# Patient Record
Sex: Female | Born: 1980 | Race: Black or African American | Hispanic: No | Marital: Single | Smoking: Former smoker
Health system: Southern US, Community
[De-identification: ages and names within clinical notes are randomized; demographics above are authoritative.]

## PROBLEM LIST (undated history)

## (undated) DIAGNOSIS — D219 Benign neoplasm of connective and other soft tissue, unspecified: Secondary | ICD-10-CM

## (undated) DIAGNOSIS — Z91018 Allergy to other foods: Secondary | ICD-10-CM

## (undated) DIAGNOSIS — R011 Cardiac murmur, unspecified: Secondary | ICD-10-CM

## (undated) DIAGNOSIS — F419 Anxiety disorder, unspecified: Secondary | ICD-10-CM

## (undated) DIAGNOSIS — A5400 Gonococcal infection of lower genitourinary tract, unspecified: Secondary | ICD-10-CM

## (undated) DIAGNOSIS — M2550 Pain in unspecified joint: Secondary | ICD-10-CM

## (undated) DIAGNOSIS — F32A Depression, unspecified: Secondary | ICD-10-CM

## (undated) DIAGNOSIS — F988 Other specified behavioral and emotional disorders with onset usually occurring in childhood and adolescence: Secondary | ICD-10-CM

## (undated) DIAGNOSIS — M549 Dorsalgia, unspecified: Secondary | ICD-10-CM

## (undated) DIAGNOSIS — G4730 Sleep apnea, unspecified: Secondary | ICD-10-CM

## (undated) DIAGNOSIS — E669 Obesity, unspecified: Secondary | ICD-10-CM

## (undated) DIAGNOSIS — I10 Essential (primary) hypertension: Secondary | ICD-10-CM

## (undated) DIAGNOSIS — L309 Dermatitis, unspecified: Secondary | ICD-10-CM

## (undated) DIAGNOSIS — R609 Edema, unspecified: Secondary | ICD-10-CM

## (undated) DIAGNOSIS — Z22322 Carrier or suspected carrier of Methicillin resistant Staphylococcus aureus: Secondary | ICD-10-CM

## (undated) HISTORY — DX: Depression, unspecified: F32.A

## (undated) HISTORY — DX: Pain in unspecified joint: M25.50

## (undated) HISTORY — DX: Edema, unspecified: R60.9

## (undated) HISTORY — DX: Gonococcal infection of lower genitourinary tract, unspecified: A54.00

## (undated) HISTORY — DX: Other specified behavioral and emotional disorders with onset usually occurring in childhood and adolescence: F98.8

## (undated) HISTORY — DX: Allergy to other foods: Z91.018

## (undated) HISTORY — PX: NO PAST SURGERIES: SHX2092

## (undated) HISTORY — DX: Dorsalgia, unspecified: M54.9

---

## 2000-04-10 ENCOUNTER — Emergency Department (HOSPITAL_COMMUNITY): Admission: EM | Admit: 2000-04-10 | Discharge: 2000-04-10 | Payer: Self-pay | Admitting: Emergency Medicine

## 2000-05-01 ENCOUNTER — Encounter: Admission: RE | Admit: 2000-05-01 | Discharge: 2000-05-01 | Payer: Self-pay | Admitting: Internal Medicine

## 2000-06-07 ENCOUNTER — Encounter: Payer: Self-pay | Admitting: Emergency Medicine

## 2000-06-07 ENCOUNTER — Emergency Department (HOSPITAL_COMMUNITY): Admission: EM | Admit: 2000-06-07 | Discharge: 2000-06-07 | Payer: Self-pay | Admitting: Emergency Medicine

## 2000-07-05 ENCOUNTER — Emergency Department (HOSPITAL_COMMUNITY): Admission: EM | Admit: 2000-07-05 | Discharge: 2000-07-05 | Payer: Self-pay | Admitting: *Deleted

## 2001-05-03 ENCOUNTER — Emergency Department (HOSPITAL_COMMUNITY): Admission: EM | Admit: 2001-05-03 | Discharge: 2001-05-03 | Payer: Self-pay | Admitting: Unknown Physician Specialty

## 2001-09-14 ENCOUNTER — Emergency Department (HOSPITAL_COMMUNITY): Admission: EM | Admit: 2001-09-14 | Discharge: 2001-09-14 | Payer: Self-pay | Admitting: Emergency Medicine

## 2004-06-05 ENCOUNTER — Encounter (INDEPENDENT_AMBULATORY_CARE_PROVIDER_SITE_OTHER): Payer: Self-pay | Admitting: Nurse Practitioner

## 2004-06-05 LAB — CONVERTED CEMR LAB

## 2005-04-03 ENCOUNTER — Emergency Department (HOSPITAL_COMMUNITY): Admission: EM | Admit: 2005-04-03 | Discharge: 2005-04-03 | Payer: Self-pay | Admitting: Family Medicine

## 2005-09-24 ENCOUNTER — Inpatient Hospital Stay (HOSPITAL_COMMUNITY): Admission: AD | Admit: 2005-09-24 | Discharge: 2005-09-24 | Payer: Self-pay | Admitting: *Deleted

## 2005-09-25 ENCOUNTER — Inpatient Hospital Stay (HOSPITAL_COMMUNITY): Admission: AD | Admit: 2005-09-25 | Discharge: 2005-09-25 | Payer: Self-pay | Admitting: *Deleted

## 2006-04-20 ENCOUNTER — Emergency Department (HOSPITAL_COMMUNITY): Admission: EM | Admit: 2006-04-20 | Discharge: 2006-04-20 | Payer: Self-pay | Admitting: Emergency Medicine

## 2007-01-11 ENCOUNTER — Emergency Department (HOSPITAL_COMMUNITY): Admission: EM | Admit: 2007-01-11 | Discharge: 2007-01-11 | Payer: Self-pay | Admitting: Family Medicine

## 2007-01-21 ENCOUNTER — Ambulatory Visit: Payer: Self-pay | Admitting: Internal Medicine

## 2007-01-21 ENCOUNTER — Encounter: Payer: Self-pay | Admitting: Nurse Practitioner

## 2007-01-21 DIAGNOSIS — E66813 Obesity, class 3: Secondary | ICD-10-CM | POA: Insufficient documentation

## 2007-01-21 DIAGNOSIS — K029 Dental caries, unspecified: Secondary | ICD-10-CM | POA: Insufficient documentation

## 2007-01-21 DIAGNOSIS — J45909 Unspecified asthma, uncomplicated: Secondary | ICD-10-CM | POA: Insufficient documentation

## 2007-01-21 DIAGNOSIS — E669 Obesity, unspecified: Secondary | ICD-10-CM | POA: Insufficient documentation

## 2007-02-21 ENCOUNTER — Ambulatory Visit: Payer: Self-pay | Admitting: Nurse Practitioner

## 2007-02-21 DIAGNOSIS — L309 Dermatitis, unspecified: Secondary | ICD-10-CM | POA: Insufficient documentation

## 2007-02-21 DIAGNOSIS — R21 Rash and other nonspecific skin eruption: Secondary | ICD-10-CM | POA: Insufficient documentation

## 2007-02-21 LAB — CONVERTED CEMR LAB
Alkaline Phosphatase: 63 units/L (ref 39–117)
BUN: 12 mg/dL (ref 6–23)
Beta hcg, urine, semiquantitative: NEGATIVE
Eosinophils Absolute: 0.2 10*3/uL (ref 0.0–0.7)
Eosinophils Relative: 4 % (ref 0–5)
Glucose, Bld: 88 mg/dL (ref 70–99)
Glucose, Urine, Semiquant: NEGATIVE
HCT: 41.2 % (ref 36.0–46.0)
HDL: 45 mg/dL (ref >39)
LDL Cholesterol: 64 mg/dL (ref 0–99)
Lymphs Abs: 2.1 10*3/uL (ref 0.7–3.3)
MCV: 93.6 fL (ref 78.0–100.0)
Monocytes Relative: 9 % (ref 3–11)
Nitrite: NEGATIVE
Platelets: 267 10*3/uL (ref 150–400)
Total Bilirubin: 0.4 mg/dL (ref 0.3–1.2)
Triglycerides: 65 mg/dL (ref <150)
Urobilinogen, UA: 0.2
VLDL: 13 mg/dL (ref 0–40)
WBC: 4.9 10*3/uL (ref 4.0–10.5)
pH: 7.5

## 2007-02-25 ENCOUNTER — Ambulatory Visit: Payer: Self-pay | Admitting: *Deleted

## 2007-03-07 ENCOUNTER — Ambulatory Visit (HOSPITAL_BASED_OUTPATIENT_CLINIC_OR_DEPARTMENT_OTHER): Admission: RE | Admit: 2007-03-07 | Discharge: 2007-03-07 | Payer: Self-pay | Admitting: Nurse Practitioner

## 2007-03-07 ENCOUNTER — Encounter (INDEPENDENT_AMBULATORY_CARE_PROVIDER_SITE_OTHER): Payer: Self-pay | Admitting: Nurse Practitioner

## 2007-03-14 ENCOUNTER — Ambulatory Visit: Payer: Self-pay | Admitting: Internal Medicine

## 2007-03-21 ENCOUNTER — Encounter (INDEPENDENT_AMBULATORY_CARE_PROVIDER_SITE_OTHER): Payer: Self-pay | Admitting: Nurse Practitioner

## 2007-03-25 ENCOUNTER — Ambulatory Visit: Payer: Self-pay | Admitting: Nurse Practitioner

## 2007-03-25 DIAGNOSIS — G4733 Obstructive sleep apnea (adult) (pediatric): Secondary | ICD-10-CM | POA: Insufficient documentation

## 2007-04-17 ENCOUNTER — Encounter (INDEPENDENT_AMBULATORY_CARE_PROVIDER_SITE_OTHER): Payer: Self-pay | Admitting: Nurse Practitioner

## 2007-04-22 ENCOUNTER — Encounter (INDEPENDENT_AMBULATORY_CARE_PROVIDER_SITE_OTHER): Payer: Self-pay | Admitting: Nurse Practitioner

## 2007-04-22 ENCOUNTER — Ambulatory Visit (HOSPITAL_BASED_OUTPATIENT_CLINIC_OR_DEPARTMENT_OTHER): Admission: RE | Admit: 2007-04-22 | Discharge: 2007-04-22 | Payer: Self-pay | Admitting: Nurse Practitioner

## 2007-04-28 ENCOUNTER — Ambulatory Visit: Payer: Self-pay | Admitting: Internal Medicine

## 2007-04-29 ENCOUNTER — Ambulatory Visit: Payer: Self-pay | Admitting: *Deleted

## 2007-04-29 ENCOUNTER — Emergency Department (HOSPITAL_COMMUNITY): Admission: EM | Admit: 2007-04-29 | Discharge: 2007-04-29 | Payer: Self-pay | Admitting: Emergency Medicine

## 2007-04-29 ENCOUNTER — Encounter (INDEPENDENT_AMBULATORY_CARE_PROVIDER_SITE_OTHER): Payer: Self-pay | Admitting: Emergency Medicine

## 2007-05-16 ENCOUNTER — Encounter (INDEPENDENT_AMBULATORY_CARE_PROVIDER_SITE_OTHER): Payer: Self-pay | Admitting: Nurse Practitioner

## 2007-08-06 ENCOUNTER — Ambulatory Visit: Payer: Self-pay | Admitting: Nurse Practitioner

## 2007-08-06 DIAGNOSIS — I872 Venous insufficiency (chronic) (peripheral): Secondary | ICD-10-CM | POA: Insufficient documentation

## 2007-08-06 DIAGNOSIS — R609 Edema, unspecified: Secondary | ICD-10-CM | POA: Insufficient documentation

## 2007-08-16 ENCOUNTER — Encounter (INDEPENDENT_AMBULATORY_CARE_PROVIDER_SITE_OTHER): Payer: Self-pay | Admitting: Nurse Practitioner

## 2007-11-22 ENCOUNTER — Ambulatory Visit: Payer: Self-pay | Admitting: Nurse Practitioner

## 2007-11-22 DIAGNOSIS — N946 Dysmenorrhea, unspecified: Secondary | ICD-10-CM | POA: Insufficient documentation

## 2007-11-22 DIAGNOSIS — M79609 Pain in unspecified limb: Secondary | ICD-10-CM | POA: Insufficient documentation

## 2007-11-22 DIAGNOSIS — B353 Tinea pedis: Secondary | ICD-10-CM | POA: Insufficient documentation

## 2007-11-27 ENCOUNTER — Ambulatory Visit (HOSPITAL_COMMUNITY): Admission: RE | Admit: 2007-11-27 | Discharge: 2007-11-27 | Payer: Self-pay | Admitting: Internal Medicine

## 2008-01-10 ENCOUNTER — Encounter (INDEPENDENT_AMBULATORY_CARE_PROVIDER_SITE_OTHER): Payer: Self-pay | Admitting: Nurse Practitioner

## 2008-01-10 ENCOUNTER — Telehealth (INDEPENDENT_AMBULATORY_CARE_PROVIDER_SITE_OTHER): Payer: Self-pay | Admitting: Nurse Practitioner

## 2008-03-24 ENCOUNTER — Encounter (INDEPENDENT_AMBULATORY_CARE_PROVIDER_SITE_OTHER): Payer: Self-pay | Admitting: Nurse Practitioner

## 2008-03-24 ENCOUNTER — Ambulatory Visit: Payer: Self-pay | Admitting: Nurse Practitioner

## 2008-03-24 DIAGNOSIS — D259 Leiomyoma of uterus, unspecified: Secondary | ICD-10-CM | POA: Insufficient documentation

## 2008-03-24 LAB — CONVERTED CEMR LAB
Beta hcg, urine, semiquantitative: NEGATIVE
Blood in Urine, dipstick: NEGATIVE
Nitrite: NEGATIVE
Protein, U semiquant: NEGATIVE
WBC Urine, dipstick: NEGATIVE

## 2008-03-25 DIAGNOSIS — A5400 Gonococcal infection of lower genitourinary tract, unspecified: Secondary | ICD-10-CM | POA: Insufficient documentation

## 2008-03-25 HISTORY — DX: Gonococcal infection of lower genitourinary tract, unspecified: A54.00

## 2008-03-27 ENCOUNTER — Ambulatory Visit: Payer: Self-pay | Admitting: Nurse Practitioner

## 2008-04-02 LAB — CONVERTED CEMR LAB
Albumin: 3.9 g/dL (ref 3.5–5.2)
CO2: 26 meq/L (ref 19–32)
Calcium: 8.9 mg/dL (ref 8.4–10.5)
Chloride: 105 meq/L (ref 96–112)
Cholesterol: 124 mg/dL (ref 0–200)
Eosinophils Relative: 4 % (ref 0–5)
GC Probe Amp, Genital: POSITIVE — AB
Glucose, Bld: 88 mg/dL (ref 70–99)
HCT: 41.5 % (ref 36.0–46.0)
Hemoglobin: 13.2 g/dL (ref 12.0–15.0)
Lymphocytes Relative: 40 % (ref 12–46)
Lymphs Abs: 2 10*3/uL (ref 0.7–4.0)
Neutro Abs: 2.4 10*3/uL (ref 1.7–7.7)
Platelets: 272 10*3/uL (ref 150–400)
Sodium: 141 meq/L (ref 135–145)
Total Bilirubin: 0.5 mg/dL (ref 0.3–1.2)
Total Protein: 7.8 g/dL (ref 6.0–8.3)
Triglycerides: 66 mg/dL (ref <150)
VLDL: 13 mg/dL (ref 0–40)
WBC: 5 10*3/uL (ref 4.0–10.5)

## 2008-04-03 ENCOUNTER — Ambulatory Visit: Payer: Self-pay | Admitting: Nurse Practitioner

## 2008-04-03 DIAGNOSIS — J45901 Unspecified asthma with (acute) exacerbation: Secondary | ICD-10-CM | POA: Insufficient documentation

## 2008-04-03 DIAGNOSIS — J069 Acute upper respiratory infection, unspecified: Secondary | ICD-10-CM | POA: Insufficient documentation

## 2008-04-10 ENCOUNTER — Ambulatory Visit: Payer: Self-pay | Admitting: Internal Medicine

## 2008-04-10 DIAGNOSIS — J309 Allergic rhinitis, unspecified: Secondary | ICD-10-CM | POA: Insufficient documentation

## 2008-04-14 ENCOUNTER — Telehealth (INDEPENDENT_AMBULATORY_CARE_PROVIDER_SITE_OTHER): Payer: Self-pay | Admitting: Nurse Practitioner

## 2008-10-17 ENCOUNTER — Inpatient Hospital Stay (HOSPITAL_COMMUNITY): Admission: AD | Admit: 2008-10-17 | Discharge: 2008-10-17 | Payer: Self-pay | Admitting: Obstetrics & Gynecology

## 2009-05-24 ENCOUNTER — Emergency Department (HOSPITAL_COMMUNITY): Admission: EM | Admit: 2009-05-24 | Discharge: 2009-05-24 | Payer: Self-pay | Admitting: Emergency Medicine

## 2009-08-15 ENCOUNTER — Emergency Department (HOSPITAL_COMMUNITY): Admission: EM | Admit: 2009-08-15 | Discharge: 2009-08-15 | Payer: Self-pay | Admitting: Emergency Medicine

## 2010-02-11 ENCOUNTER — Ambulatory Visit: Payer: Self-pay | Admitting: Family

## 2010-02-11 ENCOUNTER — Inpatient Hospital Stay (HOSPITAL_COMMUNITY): Admission: AD | Admit: 2010-02-11 | Discharge: 2010-02-11 | Payer: Self-pay | Admitting: Family Medicine

## 2010-08-18 LAB — WET PREP, GENITAL: Trich, Wet Prep: NONE SEEN

## 2010-08-18 LAB — URINALYSIS, ROUTINE W REFLEX MICROSCOPIC
Protein, ur: NEGATIVE mg/dL
Urobilinogen, UA: 0.2 mg/dL (ref 0.0–1.0)

## 2010-08-18 LAB — POCT PREGNANCY, URINE: Preg Test, Ur: NEGATIVE

## 2010-09-13 LAB — CBC
Hemoglobin: 13.2 g/dL (ref 12.0–15.0)
MCHC: 34.4 g/dL (ref 30.0–36.0)
MCV: 89.7 fL (ref 78.0–100.0)
RDW: 14.2 % (ref 11.5–15.5)

## 2010-09-13 LAB — HCG, QUANTITATIVE, PREGNANCY: hCG, Beta Chain, Quant, S: <2 m[IU]/mL (ref <5)

## 2010-09-13 LAB — GC/CHLAMYDIA PROBE AMP, GENITAL
Chlamydia, DNA Probe: NEGATIVE
GC Probe Amp, Genital: NEGATIVE

## 2010-09-13 LAB — COMPREHENSIVE METABOLIC PANEL
ALT: 16 U/L (ref 0–35)
Calcium: 8.7 mg/dL (ref 8.4–10.5)
Creatinine, Ser: 0.82 mg/dL (ref 0.4–1.2)
Glucose, Bld: 96 mg/dL (ref 70–99)
Sodium: 137 mEq/L (ref 135–145)
Total Protein: 7.1 g/dL (ref 6.0–8.3)

## 2010-09-13 LAB — WET PREP, GENITAL: Trich, Wet Prep: NONE SEEN

## 2010-10-18 NOTE — Procedures (Signed)
NAME:  NYSA, SARIN NO.:  [id number]   MEDICAL RECORD NO.:  [id number]          PATIENT TYPE:  OUT   LOCATION:  SLEEP CENTER                 FACILITY:  [facility]   PHYSICIAN:  Clinton D. Maple Hudson, MD, FCCP, FACPDATE OF BIRTH:  1981-05-[birth year]   DATE OF STUDY:  04/22/2007                            NOCTURNAL POLYSOMNOGRAM   REFERRING PHYSICIAN:  Lehman Prom, Dr.   INDICATION FOR STUDY:  Hypersomnia with sleep apnea.   EPWORTH SLEEPINESS SCORE:  17/24, BMI 59, weight 335 pounds, height 63  inches, neck 17 inches.   HOME MEDICATIONS:  Albuterol.   A baseline study on March 07, 2007, had recorded an AHI of 121.4 per  hour.  CPAP titrated to 19-CWP, failed to provide therapeutic control  and was not well tolerated.  The patient returns for full night CPAP  titration.   SLEEP ARCHITECTURE:  Total sleep time 331 minutes with sleep efficiency  88%.  Sleep latency 4 minutes.  REM latency 129 minutes.  Awake after  sleep onset 33 minutes.  Stage I was 6%, stage II 66%, stage III 13%,  REM 13% of total sleep time.  Awake after sleep onset 33 minutes.  Arousal index 8.3.  No bedtime medication was provided.   RESPIRATORY DATA:  CPAP titration protocol.  CPAP was titrated to 22-  CWP.  AHI 72 per hour.  Bilevel BiPAP was then titrated to a best  setting of inspiratory 22/expiratory 18-CWP for an AHI of 2.9  obstructive events per hour.  A medium Mirage Quattro mask was used with  heated humidifier.   OXYGEN DATA:  Snoring was prevented and mean oxygen saturation held at  94% on room air.   CARDIAC DATA:  Normal sinus rhythm.   MOVEMENT-PARASOMNIA:  No significant movement disturbance.   IMPRESSIONS-RECOMMENDATIONS:  1. Successful bilevel PAP titration to inspiratory 22/expiratory 18,      apnea/hypopnea index 2.9 per hour.  A      medium Mirage Quattro mask was used with heated humidifier.  2. Previous diagnostic sleep study on March 07, 2007, indicated  apnea/hypopnea index 121.4 per hour.      Clinton D. Maple Hudson, MD, [facility], FACP  Diplomate, Biomedical engineer of Sleep Medicine  Electronically Signed     CDY/MEDQ  D:  04/28/2007 09:38:43  T:  04/28/2007 16:06:52  Job:  [id number]

## 2010-10-18 NOTE — Procedures (Signed)
NAME:  Marissa Joseph, Marissa Joseph NO.:  [id number]   MEDICAL RECORD NO.:  [id number]          PATIENT TYPE:  OUT   LOCATION:  SLEEP CENTER                 FACILITY:  [facility]   PHYSICIAN:  Clinton D. Maple Hudson, MD, FCCP, FACPDATE OF BIRTH:  03[birth year]11-81   DATE OF STUDY:  03/07/2007                            NOCTURNAL POLYSOMNOGRAM   REFERRING PHYSICIAN:  NYKEDTRA MARTIN   INDICATION FOR STUDY:  Hypersomnia with sleep apnea.   EPWORTH SLEEPINESS SCORE:  17/24.  Height 5 feet, 3 inches, weight 325  pounds.   MEDICATIONS:  Listed only as albuterol.   SLEEP ARCHITECTURE:  Total sleep time 346 minutes with sleep efficiency  75%.  Stage I was 12%, stage II was 86%, stage III was 3%, REM absent.  Sleep latency 9 minutes.  Awake after sleep onset 105 minutes.  Arousal  index 31.3.  No bedtime medication was taken.   RESPIRATORY DATA:  Split study protocol.  Apnea/hypopnea index (AHI/RDI)  121.4 obstructive events per hour, indicating severe obstructive sleep  apnea/hypopnea syndrome before CPAP.  There were 55 obstructive apnea's,  1 central apnea and 453 hypopnea's before CPAP.  Events were not  positional.  CPAP was titrated to 19 CWP during available time without  obtaining control.  Final apnea/hypopnea index was 146.  Technician  indicates the patient was crying, afraid of the airflow and titration  effort was stopped at about 3:30 A.M.   OXYGEN DATA:  Very loud snoring with oxygen desaturation to a nadir of  47%.  Mean oxygen saturation through the study was 88% on room air.   CARDIAC DATA:  No arrhythmia documented.   MOVEMENT-PARASOMNIA:  No significant movement disturbance.   IMPRESSIONS-RECOMMENDATIONS:  1. Very severe obstructive sleep apnea/hypopnea syndrome,      apnea/hypopnea index 121.4 per hour with non positional events,      very loud snoring and significant oxygen desaturation to a nadir of      47%.  2. CPAP titrated to 19 CWP.  Did not show evidence of  improving      control with a residual apnea/hypopnea index of 146 per hour.  The      patient was unable to tolerate further CPAP adjustment on this      study night and effort was discontinued between 3:30 and 4:00 A.M.      Consider return for a specific CPAP titration study to allow      adequate time and adjustment options including trial of bilevel      pressure therapy, which will probably be more appropriate for this      patient.  3. Unless CPAP or Bi-PAP provide substantial improvement, this patient      should be considered for home oxygen therapy during sleep.      Clinton D. Maple Hudson, MD, [facility], FACP  Diplomate, Biomedical engineer of Sleep Medicine  Electronically Signed     CDY/MEDQ  D:  03/14/2007 17:06:26  T:  03/15/2007 10:24:21  Job:  [id number]   cc:   Lehman Prom, M.D.

## 2011-01-20 ENCOUNTER — Emergency Department (HOSPITAL_COMMUNITY): Payer: Self-pay

## 2011-01-20 ENCOUNTER — Emergency Department (HOSPITAL_COMMUNITY)
Admission: EM | Admit: 2011-01-20 | Discharge: 2011-01-20 | Disposition: A | Discharge status: Home / Self Care | Payer: Self-pay | Attending: Emergency Medicine | Admitting: Emergency Medicine

## 2011-01-20 DIAGNOSIS — X500XXA Overexertion from strenuous movement or load, initial encounter: Secondary | ICD-10-CM | POA: Insufficient documentation

## 2011-01-20 DIAGNOSIS — M25579 Pain in unspecified ankle and joints of unspecified foot: Secondary | ICD-10-CM | POA: Insufficient documentation

## 2011-01-20 DIAGNOSIS — S93409A Sprain of unspecified ligament of unspecified ankle, initial encounter: Secondary | ICD-10-CM | POA: Insufficient documentation

## 2011-03-14 LAB — CBC
HCT: 36.1
Hemoglobin: 12.2
MCHC: 33.8
MCV: 87.3
Platelets: 272
RBC: 4.13
RDW: 13.8
WBC: 7.5

## 2011-03-14 LAB — DIFFERENTIAL
Basophils Absolute: 0
Basophils Relative: 0
Neutro Abs: 5.4
Neutrophils Relative %: 73

## 2011-03-14 LAB — BASIC METABOLIC PANEL
BUN: 12
Chloride: 104
GFR calc Af Amer: >60
GFR calc non Af Amer: >60
Potassium: 4.3
Sodium: 139

## 2011-03-16 ENCOUNTER — Emergency Department (HOSPITAL_COMMUNITY)
Admission: EM | Admit: 2011-03-16 | Discharge: 2011-03-17 | Disposition: A | Discharge status: Home / Self Care | Payer: Self-pay | Attending: Emergency Medicine | Admitting: Emergency Medicine

## 2011-03-16 ENCOUNTER — Emergency Department (HOSPITAL_COMMUNITY): Payer: Self-pay

## 2011-03-16 DIAGNOSIS — J45909 Unspecified asthma, uncomplicated: Secondary | ICD-10-CM | POA: Insufficient documentation

## 2011-03-16 DIAGNOSIS — R51 Headache: Secondary | ICD-10-CM | POA: Insufficient documentation

## 2011-03-16 LAB — POCT I-STAT, CHEM 8
Calcium, Ion: 1.19 mmol/L (ref 1.12–1.32)
Chloride: 105 mEq/L (ref 96–112)
HCT: 39 % (ref 36.0–46.0)
Potassium: 4.2 mEq/L (ref 3.5–5.1)
Sodium: 139 mEq/L (ref 135–145)

## 2011-03-25 ENCOUNTER — Emergency Department (HOSPITAL_COMMUNITY)
Admission: EM | Admit: 2011-03-25 | Discharge: 2011-03-25 | Disposition: A | Discharge status: Home / Self Care | Payer: Self-pay | Attending: Emergency Medicine | Admitting: Emergency Medicine

## 2011-03-25 DIAGNOSIS — J029 Acute pharyngitis, unspecified: Secondary | ICD-10-CM | POA: Insufficient documentation

## 2011-03-25 DIAGNOSIS — J3489 Other specified disorders of nose and nasal sinuses: Secondary | ICD-10-CM | POA: Insufficient documentation

## 2011-03-25 LAB — POCT I-STAT, CHEM 8
BUN: 14 mg/dL (ref 6–23)
Sodium: 141 mEq/L (ref 135–145)
TCO2: 25 mmol/L (ref 0–100)

## 2011-03-25 LAB — RAPID STREP SCREEN (MED CTR MEBANE ONLY): Streptococcus, Group A Screen (Direct): NEGATIVE

## 2011-05-13 ENCOUNTER — Emergency Department (HOSPITAL_COMMUNITY): Payer: Self-pay

## 2011-05-13 ENCOUNTER — Emergency Department (HOSPITAL_COMMUNITY)
Admission: EM | Admit: 2011-05-13 | Discharge: 2011-05-13 | Disposition: A | Discharge status: Home / Self Care | Payer: Self-pay | Attending: Emergency Medicine | Admitting: Emergency Medicine

## 2011-05-13 ENCOUNTER — Encounter: Payer: Self-pay | Admitting: *Deleted

## 2011-05-13 DIAGNOSIS — J069 Acute upper respiratory infection, unspecified: Secondary | ICD-10-CM | POA: Insufficient documentation

## 2011-05-13 DIAGNOSIS — R0602 Shortness of breath: Secondary | ICD-10-CM | POA: Insufficient documentation

## 2011-05-13 DIAGNOSIS — R062 Wheezing: Secondary | ICD-10-CM | POA: Insufficient documentation

## 2011-05-13 DIAGNOSIS — H9209 Otalgia, unspecified ear: Secondary | ICD-10-CM | POA: Insufficient documentation

## 2011-05-13 HISTORY — DX: Benign neoplasm of connective and other soft tissue, unspecified: D21.9

## 2011-05-13 MED ORDER — HYDROCOD POLST-CHLORPHEN POLST 10-8 MG/5ML PO LQCR: 5.0000 mL | Freq: Two times a day (BID) | ORAL | Status: DC | PRN

## 2011-05-13 MED ORDER — ALBUTEROL SULFATE HFA 108 (90 BASE) MCG/ACT IN AERS
2.0000 | INHALATION_SPRAY | RESPIRATORY_TRACT | Status: DC | PRN
  Filled 2011-05-13: qty 6.7

## 2011-05-13 MED ORDER — ALBUTEROL SULFATE (5 MG/ML) 0.5% IN NEBU
5.0000 mg | INHALATION_SOLUTION | Freq: Once | RESPIRATORY_TRACT | Status: AC
  Administered 2011-05-13: 5 mg via RESPIRATORY_TRACT
  Filled 2011-05-13: qty 1

## 2011-05-13 MED ORDER — AZITHROMYCIN 250 MG PO TABS: 250.0000 mg | ORAL_TABLET | Freq: Every day | ORAL | Status: DC

## 2011-05-13 MED ORDER — AZITHROMYCIN 250 MG PO TABS: 250.0000 mg | ORAL_TABLET | Freq: Every day | ORAL | Status: AC

## 2011-05-13 NOTE — ED Notes (Signed)
Also complaining of chronic left ear pain. Pt report pain to left ear is worse with moving head. Redness to TM

## 2011-05-13 NOTE — ED Notes (Signed)
Cold like symptoms, worse yesterday and has been using albuterol inhaler. Pt denies productive cough, but reports shortness of breath is worse since albuterol inhaler ran out this AM. Pt has history of asthma. No fevers. No one sick around patient. Short of breath with movement and talking. Able to talk in full sentences. Appears winded from walking to restroom

## 2011-05-13 NOTE — ED Provider Notes (Signed)
History     CSN: [id number] Arrival date & time: 05/13/2011  9:20 AM   First MD Initiated Contact with Patient 05/13/11 [phone]      Chief Complaint  Patient presents with  . URI  . Generalized Body Aches    (Consider location/radiation/quality/duration/timing/severity/associated sxs/prior treatment) Patient is a 30 y.o. female presenting with URI. The history is provided by the patient.  URI The primary symptoms include fever, fatigue, ear pain, cough and wheezing. The current episode started 3 to 5 days ago. This is a new problem. The problem has been gradually worsening.  Symptoms associated with the illness include chills, plugged ear sensation and congestion.    Past Medical History  Diagnosis Date  . Asthma   . Fibroids     History reviewed. No pertinent past surgical history.  No family history on file.  History  Substance Use Topics  . Smoking status: Never Smoker   . Smokeless tobacco: Not on file  . Alcohol Use: Yes     occ    OB History    Grav Para Term Preterm Abortions TAB SAB Ect Mult Living                  Review of Systems  Constitutional: Positive for fever, chills and fatigue.  HENT: Positive for ear pain and congestion.   Respiratory: Positive for cough and wheezing.   All other systems reviewed and are negative.    Allergies  Review of patient's allergies indicates no known allergies.  Home Medications  No current outpatient prescriptions on file.  BP 158/95  Pulse 85  Temp(Src) 99.7 F (37.6 C) (Oral)  Resp 24  SpO2 95%  LMP 05/10/2011  Physical Exam  Nursing note and vitals reviewed. Constitutional: She is oriented to person, place, and time. She appears well-developed and well-nourished. No distress.  HENT:  Head: Normocephalic and atraumatic.       Left tm erythematous.  Neck: Normal range of motion. Neck supple.  Cardiovascular: Normal rate and regular rhythm.  Exam reveals no gallop and no friction rub.   No murmur  heard. Pulmonary/Chest: Effort normal. No respiratory distress. She has wheezes.       Slight wheezes bilat.  Abdominal: Soft. Bowel sounds are normal. She exhibits no distension. There is no tenderness.  Musculoskeletal: Normal range of motion.  Neurological: She is alert and oriented to person, place, and time.  Skin: Skin is warm and dry. She is not diaphoretic.    ED Course  Procedures (including critical care time)  Labs Reviewed - No data to display No results found.   No diagnosis found.    MDM  CXR looks okay.  Patient has uri, likely viral.          Geoffery Lyons, MD 05/13/11 [phone]

## 2011-06-29 ENCOUNTER — Emergency Department (HOSPITAL_COMMUNITY)
Admission: EM | Admit: 2011-06-29 | Discharge: 2011-06-29 | Disposition: A | Discharge status: Home / Self Care | Payer: Self-pay | Attending: Emergency Medicine | Admitting: Emergency Medicine

## 2011-06-29 ENCOUNTER — Encounter (HOSPITAL_COMMUNITY): Payer: Self-pay | Admitting: Adult Health

## 2011-06-29 DIAGNOSIS — R609 Edema, unspecified: Secondary | ICD-10-CM | POA: Insufficient documentation

## 2011-06-29 DIAGNOSIS — X58XXXA Exposure to other specified factors, initial encounter: Secondary | ICD-10-CM | POA: Insufficient documentation

## 2011-06-29 DIAGNOSIS — T7802XA Anaphylactic reaction due to shellfish (crustaceans), initial encounter: Secondary | ICD-10-CM | POA: Insufficient documentation

## 2011-06-29 DIAGNOSIS — R062 Wheezing: Secondary | ICD-10-CM | POA: Insufficient documentation

## 2011-06-29 MED ORDER — PREDNISONE 10 MG PO TABS: 20.0000 mg | ORAL_TABLET | Freq: Every day | ORAL | Status: DC

## 2011-06-29 MED ORDER — DIPHENHYDRAMINE HCL 25 MG PO CAPS
25.0000 mg | ORAL_CAPSULE | Freq: Once | ORAL | Status: AC
  Administered 2011-06-29: 25 mg via ORAL
  Filled 2011-06-29: qty 1

## 2011-06-29 MED ORDER — FAMOTIDINE 20 MG PO TABS
40.0000 mg | ORAL_TABLET | Freq: Once | ORAL | Status: AC
  Administered 2011-06-29: 40 mg via ORAL

## 2011-06-29 MED ORDER — FAMOTIDINE IN NACL 20-0.9 MG/50ML-% IV SOLN
20.0000 mg | Freq: Once | INTRAVENOUS | Status: AC
  Administered 2011-06-29: 20 mg via INTRAVENOUS
  Filled 2011-06-29: qty 50

## 2011-06-29 MED ORDER — DIPHENHYDRAMINE HCL 50 MG/ML IJ SOLN
25.0000 mg | Freq: Once | INTRAMUSCULAR | Status: AC
  Administered 2011-06-29: 25 mg via INTRAVENOUS
  Filled 2011-06-29: qty 1

## 2011-06-29 MED ORDER — METHYLPREDNISOLONE SODIUM SUCC 125 MG IJ SOLR
125.0000 mg | Freq: Once | INTRAMUSCULAR | Status: AC
  Administered 2011-06-29: 125 mg via INTRAMUSCULAR

## 2011-06-29 MED ORDER — DIPHENHYDRAMINE HCL 25 MG PO CAPS: 25.0000 mg | ORAL_CAPSULE | Freq: Four times a day (QID) | ORAL | Status: AC | PRN

## 2011-06-29 MED ORDER — DIPHENHYDRAMINE HCL 50 MG/ML IJ SOLN
25.0000 mg | Freq: Once | INTRAMUSCULAR | Status: AC
  Administered 2011-06-29: 25 mg via INTRAMUSCULAR

## 2011-06-29 MED ORDER — EPINEPHRINE 0.3 MG/0.3ML IJ DEVI
0.3000 mg | Freq: Once | INTRAMUSCULAR | Status: AC
  Administered 2011-06-29: 0.3 mg via INTRAMUSCULAR

## 2011-06-29 MED ORDER — FAMOTIDINE 20 MG PO TABS: 20.0000 mg | ORAL_TABLET | Freq: Two times a day (BID) | ORAL | Status: DC

## 2011-06-29 NOTE — ED Notes (Signed)
Vital signs stable. 

## 2011-06-29 NOTE — ED Notes (Signed)
Ate shrimp at 5 pm, began having edema to lips, hives and feeling like throat is closing. Brought into truiage room. Pt 92% on RA, placed on O2, tiffany PA, called in room pt given 1 amp of 0.3mg  epi, 125 solumedrol, 25mg  of benadryl and 40 mg Pepcid. Placed on monitor and IV started. Bilateral wheezes.HR 125

## 2011-06-29 NOTE — ED Provider Notes (Signed)
History     CSN: [id number]  Arrival date & time 06/29/11  1722   First MD Initiated Contact with Patient 06/29/11 1719      No chief complaint on file.   (Consider location/radiation/quality/duration/timing/severity/associated sxs/prior treatment) HPI  Pt presents to the ED with complaints of allergy to shrimp. She ate Shrimp around 5pm and then immediately began to have edema to lips, tongue, throat and hives. She describes the symptoms as "her throat is closing in. I was called into the triage room and orderes 1 amp of 0,3mg  epi SQ, 125mg  Solumedrol, 25mg  Benadryl, and 40mg  Pepcid IV. Pt was placed on the cardiac monitor and IV started. Pt is able to speak in complete sentences but i hear audible wheezes.  Past Medical History  Diagnosis Date  . Asthma   . Fibroids     History reviewed. No pertinent past surgical history.  History reviewed. No pertinent family history.  History  Substance Use Topics  . Smoking status: Never Smoker   . Smokeless tobacco: Not on file  . Alcohol Use: Yes     occ    OB History    Grav Para Term Preterm Abortions TAB SAB Ect Mult Living                  Review of Systems  All other systems reviewed and are negative.    Allergies  Review of patient's allergies indicates no known allergies.  Home Medications   Current Outpatient Rx  Name Route Sig Dispense Refill  . ALBUTEROL SULFATE HFA 108 (90 BASE) MCG/ACT IN AERS Inhalation Inhale 2 puffs into the lungs every 6 (six) hours as needed. Shortness of breath     . FLUTICASONE-SALMETEROL 250-50 MCG/DOSE IN AEPB Inhalation Inhale 1 puff into the lungs as needed.     . IBUPROFEN 200 MG PO TABS Oral Take 200 mg by mouth every 6 (six) hours as needed. pain     . ADULT MULTIVITAMIN W/MINERALS CH Oral Take 1 tablet by mouth daily.    Marland Kitchen DIPHENHYDRAMINE HCL 25 MG PO CAPS Oral Take 1 capsule (25 mg total) by mouth every 6 (six) hours as needed for itching (As needed for swelling and  itching). 30 capsule 0  . FAMOTIDINE 20 MG PO TABS Oral Take 1 tablet (20 mg total) by mouth 2 (two) times daily. 30 tablet 0  . PREDNISONE 10 MG PO TABS Oral Take 2 tablets (20 mg total) by mouth daily. Pt take 60mg  the first three days, 40mg  on day 4-6, then 20mg  on day 7. 32 tablet 0    BP 108/78  Pulse 81  Temp(Src) 98.3 F (36.8 C) (Oral)  Resp 20  SpO2 99%  LMP 06/06/2011  Physical Exam  Nursing note and vitals reviewed. Constitutional: She appears well-developed and well-nourished.  HENT:  Head: Normocephalic and atraumatic.  Mouth/Throat: Posterior oropharyngeal edema present.  Eyes: Pupils are equal, round, and reactive to light.  Neck: Trachea normal, normal range of motion and full passive range of motion without pain.  Cardiovascular: Normal rate, regular rhythm and normal pulses.   Pulmonary/Chest: Effort normal. No respiratory distress. She has wheezes. She has no rales. Chest wall is not dull to percussion. She exhibits no tenderness, no crepitus, no edema, no deformity and no retraction.  Abdominal: Soft. Normal appearance.  Musculoskeletal: Normal range of motion.  Neurological: She is alert. She has normal strength.  Skin: Skin is warm, dry and intact.  Psychiatric: Her speech is normal.  Cognition and memory are normal.    ED Course  Procedures (including critical care time)  Labs Reviewed - No data to display No results found.   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  She describes some mild tongue swelling but states that all of her other symptoms are markedly improved.    I have discussed the patients discharge with Dr. Lars Mage and she has recommended her discharge medication and instructions to be; Pt will be given Prednisone 60, 40, 20 mg taper, Pepcid BID until prednisone is gone and take Benadryl if you feel itching or symptoms of swelling start to begin. Pt to return to ED if  symptoms return.      Dorthula Matas, PA 06/29/11 2135

## 2011-06-29 NOTE — ED Notes (Signed)
MD at bedside. 

## 2011-06-29 NOTE — ED Provider Notes (Signed)
Patient relates she had skin testing done when she was a child and she was allergic to shellfish. However she's eaten seafood all her life. She was eating shrimp and oysters and immediately started getting swelling of her tongue  and lips.  Patient has obvious swelling of her lips and her time. She is noted to have some firmness in her submental region. She has received IM Solu-Medrol and Benadryl and epi subcutaneous. She relates she is starting to feel like her swelling is improving.   Medical screening examination/treatment/procedure(s) were conducted as a shared visit with non-physician practitioner(s) and myself.  I personally evaluated the patient during the encounter Devoria Albe, MD, Franz Dell, MD 06/29/11 [phone]

## 2011-06-29 NOTE — ED Notes (Signed)
Patient is resting comfortably. 

## 2011-06-29 NOTE — ED Notes (Signed)
Family at bedside. 

## 2011-06-29 NOTE — ED Notes (Signed)
Patient denies pain and is resting comfortably.  

## 2011-06-30 NOTE — ED Provider Notes (Signed)
See prior note Devoria Albe, MD, Franz Dell, MD 06/30/11 [phone]

## 2011-08-12 ENCOUNTER — Emergency Department (HOSPITAL_COMMUNITY)
Admission: EM | Admit: 2011-08-12 | Discharge: 2011-08-13 | Disposition: A | Discharge status: Home Health | Payer: Self-pay | Attending: Emergency Medicine | Admitting: Emergency Medicine

## 2011-08-12 ENCOUNTER — Encounter (HOSPITAL_COMMUNITY): Payer: Self-pay | Admitting: Emergency Medicine

## 2011-08-12 DIAGNOSIS — R109 Unspecified abdominal pain: Secondary | ICD-10-CM

## 2011-08-12 DIAGNOSIS — N949 Unspecified condition associated with female genital organs and menstrual cycle: Secondary | ICD-10-CM | POA: Insufficient documentation

## 2011-08-12 DIAGNOSIS — R1011 Right upper quadrant pain: Secondary | ICD-10-CM | POA: Insufficient documentation

## 2011-08-12 DIAGNOSIS — R110 Nausea: Secondary | ICD-10-CM | POA: Insufficient documentation

## 2011-08-12 DIAGNOSIS — J45909 Unspecified asthma, uncomplicated: Secondary | ICD-10-CM | POA: Insufficient documentation

## 2011-08-12 LAB — URINALYSIS, ROUTINE W REFLEX MICROSCOPIC
Bilirubin Urine: NEGATIVE
Hgb urine dipstick: NEGATIVE
Ketones, ur: NEGATIVE mg/dL
Protein, ur: NEGATIVE mg/dL
Urobilinogen, UA: 1 mg/dL (ref 0.0–1.0)

## 2011-08-12 LAB — COMPREHENSIVE METABOLIC PANEL
AST: 17 U/L (ref 0–37)
CO2: 27 mEq/L (ref 19–32)
Calcium: 9.2 mg/dL (ref 8.4–10.5)
Creatinine, Ser: 0.81 mg/dL (ref 0.50–1.10)
GFR calc non Af Amer: >90 mL/min (ref >90)

## 2011-08-12 LAB — CBC
HCT: 39.3 % (ref 36.0–46.0)
MCHC: 33.8 g/dL (ref 30.0–36.0)
MCV: 90.1 fL (ref 78.0–100.0)
RDW: 13.3 % (ref 11.5–15.5)

## 2011-08-12 LAB — LIPASE, BLOOD: Lipase: 20 U/L (ref 11–59)

## 2011-08-12 LAB — DIFFERENTIAL
Basophils Absolute: 0 10*3/uL (ref 0.0–0.1)
Basophils Relative: 0 % (ref 0–1)
Eosinophils Relative: 3 % (ref 0–5)
Lymphocytes Relative: 36 % (ref 12–46)
Monocytes Absolute: 0.6 10*3/uL (ref 0.1–1.0)

## 2011-08-12 NOTE — ED Notes (Signed)
Patient complaining of sharp right and left upper quadrant abdominal pain for the past three to four days; patient reports nausea.  Denies vomiting and diarrhea.  Patient reports urinary frequency, especially at night.  Denies vaginal discharge.

## 2011-08-12 NOTE — ED Provider Notes (Signed)
History     CSN: [id number]  Arrival date & time 08/12/11  2046   First MD Initiated Contact with Patient 08/12/11 2244      Chief Complaint  Patient presents with  . Abdominal Pain    (Consider location/radiation/quality/duration/timing/severity/associated sxs/prior treatment) HPI Comments: Morbidly obese, African American female, complaining of diffuse abdominal pain, worse when she gets up in the morning and moving around.  She states it starts in her upper quadrant and radiates to her vagina.  She has a [facility]  She has not had any followup for this.  Denies vaginal discharge, vaginal bleeding, normal menstrual cycle ended February 28.  She uses no birth control she states she has regular menstrual periods.  The of upper abdominal pain is not influenced by food or lack of food.  Movement, doesn't seem to make it better or worse.  She is taking no over-the-counter medicine for this.  She does not have a local doctor  Patient is a 31 y.o. female presenting with abdominal pain. The history is provided by the patient.  Abdominal Pain The primary symptoms of the illness include abdominal pain and nausea. The primary symptoms of the illness do not include fever, shortness of breath, vomiting, diarrhea, dysuria or vaginal discharge. The current episode started more than 2 days ago. The onset of the illness was gradual. The problem has not changed since onset. The abdominal pain began more than 2 days ago. The pain came on gradually. The abdominal pain has been unchanged since its onset. The abdominal pain is generalized. The severity of the abdominal pain is 6/10. The abdominal pain is exacerbated by certain positions.  Nausea began more than 1 week ago. The nausea is associated with exercise. The nausea is exacerbated by activity.  The patient states that she believes she is currently not pregnant. The patient has not had a change in bowel  habit. Symptoms associated with the illness do not include chills, constipation or urgency.    Past Medical History  Diagnosis Date  . Asthma   . Fibroids     History reviewed. No pertinent past surgical history.  History reviewed. No pertinent family history.  History  Substance Use Topics  . Smoking status: Never Smoker   . Smokeless tobacco: Not on file  . Alcohol Use: Yes     occ    OB History    Grav Para Term Preterm Abortions TAB SAB Ect Mult Living                  Review of Systems  Constitutional: Negative for fever and chills.  Respiratory: Negative for shortness of breath.   Cardiovascular: Negative for chest pain.  Gastrointestinal: Positive for nausea and abdominal pain. Negative for vomiting, diarrhea and constipation.  Genitourinary: Negative for dysuria, urgency, flank pain, vaginal discharge, vaginal pain and menstrual problem.  Musculoskeletal: Negative for myalgias.    Allergies  Shellfish allergy  Home Medications   Current Outpatient Rx  Name Route Sig Dispense Refill  . ALBUTEROL SULFATE HFA 108 (90 BASE) MCG/ACT IN AERS Inhalation Inhale 2 puffs into the lungs every 6 (six) hours as needed. Shortness of breath     . DIPHENHYDRAMINE HCL 25 MG PO CAPS Oral Take 25 mg by mouth every 6 (six) hours as needed. Allergy reaction    . FAMOTIDINE 20 MG PO TABS Oral Take 1 tablet (20 mg total) by mouth 2 (two) times daily.  30 tablet 0  . FLUTICASONE-SALMETEROL 250-50 MCG/DOSE IN AEPB Inhalation Inhale 1 puff into the lungs as needed.     . IBUPROFEN 200 MG PO TABS Oral Take 200 mg by mouth every 6 (six) hours as needed. pain     . MOMETASONE FUROATE 50 MCG/ACT NA SUSP Nasal Place 2 sprays into the nose daily as needed. Stuffy nose    . PROMETHAZINE HCL 25 MG PO TABS Oral Take 1 tablet (25 mg total) by mouth every 6 (six) hours as needed for nausea. 12 tablet 0    BP 135/71  Pulse 80  Temp(Src) 98.2 F (36.8 C) (Oral)  Resp 20  SpO2 97%  LMP  07/28/2011  Physical Exam  Constitutional: She is oriented to person, place, and time. She appears well-developed.  HENT:  Head: Normocephalic.  Eyes: Pupils are equal, round, and reactive to light.  Cardiovascular: Normal rate.   Pulmonary/Chest: She is in respiratory distress.  Abdominal: Soft. Bowel sounds are normal. She exhibits no distension. There is no tenderness.       Exam limited by body habitus  Musculoskeletal: Normal range of motion.  Neurological: She is alert and oriented to person, place, and time.  Skin: Skin is warm.    ED Course  Procedures (including critical care time)   Labs Reviewed  URINALYSIS, ROUTINE W REFLEX MICROSCOPIC  POCT PREGNANCY, URINE  CBC  DIFFERENTIAL  COMPREHENSIVE METABOLIC PANEL  LIPASE, BLOOD   No results found.   1. Abdominal pain of unknown etiology   2. Nausea       MDM  Patient is vague abdominal pain after review of her lab work urine.  There is no indication of gallbladder disease, urinary tract infection.  Negative for pregnancy will refer patient back to her primary care provider for further evaluation of vague abdominal pain and nausea        Arman Filter, NP 08/13/11 [id number]  Arman Filter, NP 08/13/11 2956  Arman Filter, NP 08/13/11 [phone]

## 2011-08-13 MED ORDER — PROMETHAZINE HCL 25 MG PO TABS: 25.0000 mg | ORAL_TABLET | Freq: Four times a day (QID) | ORAL | Status: AC | PRN

## 2011-08-13 NOTE — ED Notes (Signed)
Pt c/o mid abd pain onset 1 week ago.  Nausea without vomiting.  St's diarrhea at times

## 2011-08-13 NOTE — Discharge Instructions (Signed)
Abdominal Pain (Nonspecific) Your exam might not show the exact reason you have abdominal pain. Since there are many different causes of abdominal pain, another checkup and more tests may be needed. It is very important to follow up for lasting (persistent) or worsening symptoms. A possible cause of abdominal pain in any person who still has his or her appendix is acute appendicitis. Appendicitis is often hard to diagnose. Normal blood tests, urine tests, ultrasound, and CT scans do not completely rule out early appendicitis or other causes of abdominal pain. Sometimes, only the changes that happen over time will allow appendicitis and other causes of abdominal pain to be determined. Other potential problems that may require surgery may also take time to become more apparent. Because of this, it is important that you follow all of the instructions below. HOME CARE INSTRUCTIONS   Rest as much as possible.   Do not eat solid food until your pain is gone.   While adults or children have pain: A diet of water, weak decaffeinated tea, broth or bouillon, gelatin, oral rehydration solutions (ORS), frozen ice pops, or ice chips may be helpful.   When pain is gone in adults or children: Start a light diet (dry toast, crackers, applesauce, or white rice). Increase the diet slowly as long as it does not bother you. Eat no dairy products (including cheese and eggs) and no spicy, fatty, fried, or high-fiber foods.   Use no alcohol, caffeine, or cigarettes.   Take your regular medicines unless your caregiver told you not to.   Take any prescribed medicine as directed.   Only take over-the-counter or prescription medicines for pain, discomfort, or fever as directed by your caregiver. Do not give aspirin to children.  If your caregiver has given you a follow-up appointment, it is very important to keep that appointment. Not keeping the appointment could result in a permanent injury and/or lasting (chronic) pain  and/or disability. If there is any problem keeping the appointment, you must call to reschedule.  SEEK IMMEDIATE MEDICAL CARE IF:   Your pain is not gone in 24 hours.   Your pain becomes worse, changes location, or feels different.   You or your child has an oral temperature above 102 F (38.9 C), not controlled by medicine.   Your baby is older than 3 months with a rectal temperature of 102 F (38.9 C) or higher.   Your baby is 38 months old or younger with a rectal temperature of 100.4 F (38 C) or higher.   You have shaking chills.   You keep throwing up (vomiting) or cannot drink liquids.   There is blood in your vomit or you see blood in your bowel movements.   Your bowel movements become dark or black.   You have frequent bowel movements.   Your bowel movements stop (become blocked) or you cannot pass gas.   You have bloody, frequent, or painful urination.   You have yellow discoloration in the skin or whites of the eyes.   Your stomach becomes bloated or bigger.   You have dizziness or fainting.   You have chest or back pain.  MAKE SURE YOU:   Understand these instructions.   Will watch your condition.   Will get help right away if you are not doing well or get worse.  Document Released: 05/22/2005 Document Revised: 05/11/2011 Document Reviewed: 04/19/2009 [facility] Patient Information 2012 [place], [place]. 2 nitroglycerin, abdominal pain, and was assessed with lab work, which are normal  not indicating any sign of infection.  Your liver function studies are normal.  Your urine is negative for both infection and pregnancy given, given a prescription for nausea, and referral to followup with lower GI please: Make an appointment

## 2011-08-15 NOTE — ED Provider Notes (Signed)
Medical screening examination/treatment/procedure(s) were performed by non-physician practitioner and as supervising physician I was immediately available for consultation/collaboration.   Mikia Delaluz D Shalandra Leu, MD 08/15/11 0454 

## 2012-01-06 ENCOUNTER — Emergency Department (HOSPITAL_COMMUNITY)
Admission: EM | Admit: 2012-01-06 | Discharge: 2012-01-06 | Discharge status: Against Medical Advice | Payer: Self-pay | Attending: Emergency Medicine | Admitting: Emergency Medicine

## 2012-01-06 ENCOUNTER — Encounter (HOSPITAL_COMMUNITY): Payer: Self-pay | Admitting: Emergency Medicine

## 2012-01-06 DIAGNOSIS — R609 Edema, unspecified: Secondary | ICD-10-CM | POA: Insufficient documentation

## 2012-01-06 NOTE — ED Notes (Signed)
Pt alert, nad, c/o swelling to lower ext, onset was several days ago, pain started this evening, resp even unlabored, skin pwd. + edema noted

## 2012-03-04 ENCOUNTER — Emergency Department (INDEPENDENT_AMBULATORY_CARE_PROVIDER_SITE_OTHER)
Admission: EM | Admit: 2012-03-04 | Discharge: 2012-03-04 | Disposition: A | Discharge status: Nursing Home (Includes ICF) | Payer: Self-pay | Source: Home / Self Care | Attending: Family Medicine | Admitting: Family Medicine

## 2012-03-04 ENCOUNTER — Encounter (HOSPITAL_COMMUNITY): Payer: Self-pay | Admitting: *Deleted

## 2012-03-04 ENCOUNTER — Emergency Department (HOSPITAL_COMMUNITY)
Admission: EM | Admit: 2012-03-04 | Discharge: 2012-03-04 | Disposition: A | Discharge status: Home / Self Care | Payer: Self-pay | Attending: Emergency Medicine | Admitting: Emergency Medicine

## 2012-03-04 DIAGNOSIS — E669 Obesity, unspecified: Secondary | ICD-10-CM | POA: Insufficient documentation

## 2012-03-04 DIAGNOSIS — F17200 Nicotine dependence, unspecified, uncomplicated: Secondary | ICD-10-CM | POA: Insufficient documentation

## 2012-03-04 DIAGNOSIS — L03116 Cellulitis of left lower limb: Secondary | ICD-10-CM

## 2012-03-04 DIAGNOSIS — L02419 Cutaneous abscess of limb, unspecified: Secondary | ICD-10-CM | POA: Insufficient documentation

## 2012-03-04 DIAGNOSIS — J45909 Unspecified asthma, uncomplicated: Secondary | ICD-10-CM | POA: Insufficient documentation

## 2012-03-04 DIAGNOSIS — L97209 Non-pressure chronic ulcer of unspecified calf with unspecified severity: Secondary | ICD-10-CM

## 2012-03-04 DIAGNOSIS — L03119 Cellulitis of unspecified part of limb: Secondary | ICD-10-CM

## 2012-03-04 DIAGNOSIS — I10 Essential (primary) hypertension: Secondary | ICD-10-CM | POA: Insufficient documentation

## 2012-03-04 HISTORY — DX: Essential (primary) hypertension: I10

## 2012-03-04 HISTORY — DX: Obesity, unspecified: E66.9

## 2012-03-04 LAB — BASIC METABOLIC PANEL
CO2: 28 mEq/L (ref 19–32)
Chloride: 101 mEq/L (ref 96–112)
GFR calc Af Amer: >90 mL/min (ref >90)
Potassium: 3.3 mEq/L — ABNORMAL LOW (ref 3.5–5.1)
Sodium: 137 mEq/L (ref 135–145)

## 2012-03-04 LAB — CBC WITH DIFFERENTIAL/PLATELET
Basophils Absolute: 0 10*3/uL (ref 0.0–0.1)
HCT: 41 % (ref 36.0–46.0)
Lymphocytes Relative: 24 % (ref 12–46)
Monocytes Absolute: 0.6 10*3/uL (ref 0.1–1.0)
Neutro Abs: 4.1 10*3/uL (ref 1.7–7.7)
Neutrophils Relative %: 64 % (ref 43–77)
Platelets: 225 10*3/uL (ref 150–400)
RDW: 13.2 % (ref 11.5–15.5)
WBC: 6.5 10*3/uL (ref 4.0–10.5)

## 2012-03-04 LAB — GLUCOSE, CAPILLARY: Glucose-Capillary: 90 mg/dL (ref 70–99)

## 2012-03-04 MED ORDER — CEPHALEXIN 500 MG PO CAPS: 500.0000 mg | ORAL_CAPSULE | Freq: Four times a day (QID) | ORAL | Status: DC

## 2012-03-04 MED ORDER — HYDROCODONE-ACETAMINOPHEN 5-325 MG PO TABS: 1.0000 | ORAL_TABLET | ORAL | Status: DC | PRN

## 2012-03-04 MED ORDER — POTASSIUM CHLORIDE CRYS ER 20 MEQ PO TBCR
40.0000 meq | EXTENDED_RELEASE_TABLET | Freq: Once | ORAL | Status: AC
  Administered 2012-03-04: 40 meq via ORAL
  Filled 2012-03-04: qty 2

## 2012-03-04 NOTE — ED Notes (Signed)
Pt has ulcer on Left Posterior lower extremity. Wound is open and draining. Skin is warm to touch and some swelling noted. Skin color is red around the affected area. Pt states ulcer has been there for 2 months, and states it has gotten progressively worse. Pt has hx of cellulitis. Pt reports pain at 0/10 right now, but when ulcer is touched, 10/10. Pt also reports 10/10 pain when ambulating.

## 2012-03-04 NOTE — Progress Notes (Signed)
Orthopedic Tech Progress Note Patient Details:  Marissa Joseph 1981-04-30 [id number]  Ortho Devices Type of Ortho Device: Roland Rack boot Ortho Device/Splint Location: (L) LE Ortho Device/Splint Interventions: Application   Jennye Moccasin 03/04/2012, 10:30 PM

## 2012-03-04 NOTE — ED Notes (Signed)
Pt with progressively worsening nonhealing ulcer to leg posterior aspect of calf x 2 month. Legs is warm to the touch and swollen. Open area with drainage noted. CBG at urgent care 90.

## 2012-03-04 NOTE — ED Provider Notes (Signed)
History     CSN: [id number]  Arrival date & time 03/04/12  1478   First MD Initiated Contact with Patient 03/04/12 2124      Chief Complaint  Patient presents with  . Cellulitis    (Consider location/radiation/quality/duration/timing/severity/associated sxs/prior treatment) HPI History provided by pt.   Pt transferred from [facility] for cellulitis of LLE.  Pt reports having a pruritic ulceration on posterior left lower leg x 2 months.  Wound drains clear, yellow fluid, is non-painful and has not been associated w/ fever.  Denies trauma.  Comes to ED today medial aspect of left lower leg became painful today.  Aggravated by palpation; not by bearing weight or ROM of ankle/knee.  Has had cellulitis in this location in the past.  Immunocompetent.  H/o chronic venous stasis.     Past Medical History  Diagnosis Date  . Asthma   . Fibroids   . Hypertension   . Obesity     History reviewed. No pertinent past surgical history.  Family History  Problem Relation Age of Onset  . Diabetes Father     History  Substance Use Topics  . Smoking status: Current Every Day Smoker    Types: Cigarettes  . Smokeless tobacco: Not on file  . Alcohol Use: Yes     occ    OB History    Grav Para Term Preterm Abortions TAB SAB Ect Mult Living                  Review of Systems  Respiratory:       Cough and mild SOB, consistent w/ her asthma and mildly improved w/ her rescue inhaler.   All other systems reviewed and are negative.    Allergies  Shellfish allergy  Home Medications   Current Outpatient Rx  Name Route Sig Dispense Refill  . ALBUTEROL SULFATE HFA 108 (90 BASE) MCG/ACT IN AERS Inhalation Inhale 2 puffs into the lungs every 6 (six) hours as needed. Shortness of breath     . IBUPROFEN 200 MG PO TABS Oral Take 400 mg by mouth every 6 (six) hours as needed. pain    . ADULT MULTIVITAMIN W/MINERALS CH Oral Take 1 tablet by mouth daily.      BP 158/102  Pulse 89  Temp 97.9 F  (36.6 C) (Oral)  Resp 18  SpO2 98%  LMP 02/18/2012  Physical Exam  Nursing note and vitals reviewed. Constitutional: She is oriented to person, place, and time. She appears well-developed and well-nourished. No distress.  HENT:  Head: Normocephalic and atraumatic.  Eyes:       Normal appearance  Neck: Normal range of motion.  Cardiovascular: Normal rate and regular rhythm.   Pulmonary/Chest: Effort normal and breath sounds normal. No respiratory distress. She has no wheezes.       No coughing  Musculoskeletal: Normal range of motion.       2-3cm ulcer left mid-calf w/ granulation tissue, a small amount of serous drainage and surrounding induration.  No erythema or tenderness.  Skin thickening and hyperpigmentation consistent w/ chronic venous stasis bilaterally.  Left lower leg slightly more edematous than right and there is a poorly defined region of erythema and tenderness on anterolateral aspect of lower leg.  No tenderness of calf.  Distal NV intact.  Full ROM of ankle and knee w/out pain.   Neurological: She is alert and oriented to person, place, and time.  Skin: Skin is warm and dry. No rash noted.  Psychiatric:  She has a normal mood and affect. Her behavior is normal.    ED Course  Procedures (including critical care time)  Labs Reviewed  BASIC METABOLIC PANEL - Abnormal; Notable for the following:    Potassium 3.3 (*)     All other components within normal limits  CBC WITH DIFFERENTIAL   No results found.   1. Cellulitis of left lower leg       MDM  30yo immunocompetent F transferred from [facility] for further evaluation and treatment of cellulitis LLE.  Pt has a venous stasis ulcer x 2 months on left calf but does not appear infectious and cellulitis unrelated.  Doubt osteomyelitis because pt healthy, ulcer is shallow and no pain w/ bearing weight or ROM of joints.  Doubt DVT because pain is anteromedial and pt has no RF.  Labs unremarkable w/ exception of mild  hypokalemia; potassium replaced in ED.  Dr. Laurice Record has seen and recommends unna booth (applied by ortho tech), po abx and 2-3 day f/u.  Pt instructed to return sooner if swelling/pain worsens or she develops fever.  She is also aware that her BP is elevated and has been referred to [facility] to find a PCP.  10:03 PM         Otilio Miu, PA 03/04/12 2245

## 2012-03-04 NOTE — ED Notes (Signed)
Pt reports ane prior incidence of cellulitis left lower leg.  She has had swelling of the left lower leg the past 2 months and an open ulcer the past month on the posterior lower aspect.

## 2012-03-04 NOTE — ED Provider Notes (Signed)
History     CSN: [id number]  Arrival date & time 03/04/12  1544   First MD Initiated Contact with Patient 03/04/12 1700      Chief Complaint  Patient presents with  . Cellulitis    (Consider location/radiation/quality/duration/timing/severity/associated sxs/prior treatment) HPI Comments: 31 year old female morbidly obese with history of chronic venous stasis changes in her lower extremities. Here complaining of skin ulcer in her left lower extremity for 2 months that has been associated with drainage redness and pain worsening in the last 3 days. States that the affected area has been warm and more tender since yesterday. Denies fever or chills. No prior diagnosis of diabetes. Status she had prior cellulitis in the same area over a year ago.   Past Medical History  Diagnosis Date  . Asthma   . Fibroids   . Hypertension   . Obesity     History reviewed. No pertinent past surgical history.  Family History  Problem Relation Age of Onset  . Diabetes Father     History  Substance Use Topics  . Smoking status: Current Every Day Smoker    Types: Cigarettes  . Smokeless tobacco: Not on file  . Alcohol Use: Yes     occ    OB History    Grav Para Term Preterm Abortions TAB SAB Ect Mult Living                  Review of Systems  Constitutional: Negative for fever, chills and fatigue.  Gastrointestinal: Negative for abdominal pain.  Genitourinary:       No polyuria or polydipsy  Skin: Positive for color change, rash and wound.       As per history of present illness  Neurological: Negative for dizziness and headaches.  All other systems reviewed and are negative.    Allergies  Shellfish allergy  Home Medications   Current Outpatient Rx  Name Route Sig Dispense Refill  . ALBUTEROL SULFATE HFA 108 (90 BASE) MCG/ACT IN AERS Inhalation Inhale 2 puffs into the lungs every 6 (six) hours as needed. Shortness of breath     . DIPHENHYDRAMINE HCL 25 MG PO CAPS Oral Take  25 mg by mouth every 6 (six) hours as needed. Allergy reaction    . IBUPROFEN 200 MG PO TABS Oral Take 200 mg by mouth every 6 (six) hours as needed. pain     . FAMOTIDINE 20 MG PO TABS Oral Take 1 tablet (20 mg total) by mouth 2 (two) times daily. 30 tablet 0  . FLUTICASONE-SALMETEROL 250-50 MCG/DOSE IN AEPB Inhalation Inhale 1 puff into the lungs as needed.       BP 146/95  Pulse 94  Temp 98.9 F (37.2 C) (Oral)  Resp 18  SpO2 100%  LMP 02/18/2012  Physical Exam  Nursing note and vitals reviewed. Constitutional: She is oriented to person, place, and time. She appears well-developed and well-nourished. No distress.  HENT:  Head: Normocephalic and atraumatic.  Eyes: Conjunctivae normal are normal.  Cardiovascular: Normal heart sounds.   Pulmonary/Chest: Breath sounds normal.  Neurological: She is alert and oriented to person, place, and time.  Skin:       Left lower extremity: 2 cm full thickness sore, exudative with yellow gray granulation tissue. Ulcer is located in posterior mid left lower leg. There is a smaller ulcer more superficial less than 1 cm superolateral to the first ulcer. There are chronic venous stasis changes with skin thickening and hyperpigmentation. Area around ulcer is  indurated and with reported severe tenderness to touch. There is erythema more than 10 cm concentric to the central wound. Area is warm to touch.  Both feet are warm. Dorsal pedal and tibial posterior pulses are present but faint and difficult to find in the left lower extremities. No distal cyanosis.     ED Course  Procedures (including critical care time)   Labs Reviewed  GLUCOSE, CAPILLARY   No results found.   1. Cellulitis and abscess of leg   2. Skin ulcer of calf       MDM  31 year old female morbidly obese with history of chronic venous stasis changes in her lower extremities. Here with skin ulcer in her left lower extremity for 2 months that has been associated with drainage  redness and pain worsening in the last 3 days. On exam there is a 2 cm full-thickness ulcer that appears infected. There is an extensive area of induration and tender erythema with increased temperature. Concerned for deeper infection and extending cellulitis. CBG 90. My impression is that this patient will likely benefit to be started on cellulitis protocol prior to transition to oral antibiotics. Decided to transfer her to the emergency department for further evaluation and management.          Sharin Grave, MD 03/04/12 1816

## 2012-03-05 ENCOUNTER — Encounter (HOSPITAL_COMMUNITY): Payer: Self-pay | Admitting: *Deleted

## 2012-03-05 ENCOUNTER — Inpatient Hospital Stay (HOSPITAL_COMMUNITY)
Admission: EM | Admit: 2012-03-05 | Discharge: 2012-03-08 | DRG: 603 | Disposition: A | Discharge status: Home / Self Care | Payer: MEDICAID | Attending: Internal Medicine | Admitting: Internal Medicine

## 2012-03-05 ENCOUNTER — Emergency Department (HOSPITAL_COMMUNITY): Payer: Self-pay

## 2012-03-05 DIAGNOSIS — J45901 Unspecified asthma with (acute) exacerbation: Secondary | ICD-10-CM

## 2012-03-05 DIAGNOSIS — E669 Obesity, unspecified: Secondary | ICD-10-CM

## 2012-03-05 DIAGNOSIS — L259 Unspecified contact dermatitis, unspecified cause: Secondary | ICD-10-CM

## 2012-03-05 DIAGNOSIS — Z6841 Body Mass Index (BMI) 40.0 and over, adult: Secondary | ICD-10-CM

## 2012-03-05 DIAGNOSIS — M79609 Pain in unspecified limb: Secondary | ICD-10-CM

## 2012-03-05 DIAGNOSIS — K029 Dental caries, unspecified: Secondary | ICD-10-CM

## 2012-03-05 DIAGNOSIS — R21 Rash and other nonspecific skin eruption: Secondary | ICD-10-CM

## 2012-03-05 DIAGNOSIS — J069 Acute upper respiratory infection, unspecified: Secondary | ICD-10-CM

## 2012-03-05 DIAGNOSIS — N946 Dysmenorrhea, unspecified: Secondary | ICD-10-CM

## 2012-03-05 DIAGNOSIS — L03119 Cellulitis of unspecified part of limb: Secondary | ICD-10-CM

## 2012-03-05 DIAGNOSIS — L02419 Cutaneous abscess of limb, unspecified: Principal | ICD-10-CM | POA: Diagnosis present

## 2012-03-05 DIAGNOSIS — L97809 Non-pressure chronic ulcer of other part of unspecified lower leg with unspecified severity: Secondary | ICD-10-CM | POA: Diagnosis present

## 2012-03-05 DIAGNOSIS — G4733 Obstructive sleep apnea (adult) (pediatric): Secondary | ICD-10-CM

## 2012-03-05 DIAGNOSIS — Z79899 Other long term (current) drug therapy: Secondary | ICD-10-CM

## 2012-03-05 DIAGNOSIS — L03116 Cellulitis of left lower limb: Secondary | ICD-10-CM

## 2012-03-05 DIAGNOSIS — A5400 Gonococcal infection of lower genitourinary tract, unspecified: Secondary | ICD-10-CM

## 2012-03-05 DIAGNOSIS — S81802A Unspecified open wound, left lower leg, initial encounter: Secondary | ICD-10-CM

## 2012-03-05 DIAGNOSIS — D259 Leiomyoma of uterus, unspecified: Secondary | ICD-10-CM

## 2012-03-05 DIAGNOSIS — J309 Allergic rhinitis, unspecified: Secondary | ICD-10-CM

## 2012-03-05 DIAGNOSIS — B353 Tinea pedis: Secondary | ICD-10-CM

## 2012-03-05 DIAGNOSIS — I10 Essential (primary) hypertension: Secondary | ICD-10-CM | POA: Diagnosis present

## 2012-03-05 DIAGNOSIS — J45909 Unspecified asthma, uncomplicated: Secondary | ICD-10-CM

## 2012-03-05 DIAGNOSIS — L97909 Non-pressure chronic ulcer of unspecified part of unspecified lower leg with unspecified severity: Secondary | ICD-10-CM

## 2012-03-05 DIAGNOSIS — R609 Edema, unspecified: Secondary | ICD-10-CM

## 2012-03-05 LAB — CBC WITH DIFFERENTIAL/PLATELET
Basophils Absolute: 0 10*3/uL (ref 0.0–0.1)
Basophils Relative: 0 % (ref 0–1)
Eosinophils Absolute: 0 10*3/uL (ref 0.0–0.7)
MCH: 30.1 pg (ref 26.0–34.0)
MCHC: 33.5 g/dL (ref 30.0–36.0)
Neutrophils Relative %: 81 % — ABNORMAL HIGH (ref 43–77)
Platelets: 195 10*3/uL (ref 150–400)

## 2012-03-05 LAB — COMPREHENSIVE METABOLIC PANEL
ALT: 12 U/L (ref 0–35)
AST: 16 U/L (ref 0–37)
Albumin: 3.2 g/dL — ABNORMAL LOW (ref 3.5–5.2)
Alkaline Phosphatase: 64 U/L (ref 39–117)
Potassium: 3.6 mEq/L (ref 3.5–5.1)
Sodium: 138 mEq/L (ref 135–145)
Total Protein: 7.9 g/dL (ref 6.0–8.3)

## 2012-03-05 LAB — URINALYSIS, ROUTINE W REFLEX MICROSCOPIC
Ketones, ur: 15 mg/dL — AB
Leukocytes, UA: NEGATIVE
Nitrite: NEGATIVE
Protein, ur: 30 mg/dL — AB
pH: 7.5 (ref 5.0–8.0)

## 2012-03-05 LAB — URINE MICROSCOPIC-ADD ON

## 2012-03-05 LAB — PREGNANCY, URINE: Preg Test, Ur: NEGATIVE

## 2012-03-05 MED ORDER — VANCOMYCIN HCL IN DEXTROSE 1-5 GM/200ML-% IV SOLN
1000.0000 mg | Freq: Once | INTRAVENOUS | Status: AC
  Administered 2012-03-06: 1000 mg via INTRAVENOUS
  Filled 2012-03-05: qty 200

## 2012-03-05 NOTE — ED Provider Notes (Signed)
History     CSN: [id number]  Arrival date & time 03/05/12  1821   First MD Initiated Contact with Patient 03/05/12 2256      Chief Complaint  Patient presents with  . Fever    (Consider location/radiation/quality/duration/timing/severity/associated sxs/prior treatment) HPI 31 year old female presents to emergency room complaining of fever, denies weakness, fatigue, and persistent redness and pain to her left lower leg. Patient was seen in the emergency department yesterday after being referred from urgent care for cellulitis. Patient has had nonhealing ulcer to the back of her left leg ongoing for the last 2 months. Patient did not have fevers yesterday, and was prescribed Keflex and norco for pain. She was thought to have chronic venous stasis ulcer, with cellulitis. She was placed in an Radio broadcast assistant.  She was discharged around midnight. Patient did not fill her prescriptions. She reports she's had increased urination, without burning or odor to the urine over the last 24 hours. She has been hot and cold throughout the day. She took her temperature and it was 103. Patient has history of asthma and has ongoing cough. No change in her sputum. She denies any abdominal pain, and no rashes aside for the cellulitis in her left leg, no tick bites, no sick contacts. Patient reports remote history of cellulitis in the same leg several years ago. She does not have a primary care doctor.  Past Medical History  Diagnosis Date  . Asthma   . Fibroids   . Hypertension   . Obesity     History reviewed. No pertinent past surgical history.  Family History  Problem Relation Age of Onset  . Diabetes Father     History  Substance Use Topics  . Smoking status: Current Every Day Smoker    Types: Cigarettes  . Smokeless tobacco: Not on file  . Alcohol Use: Yes     occ    OB History    Grav Para Term Preterm Abortions TAB SAB Ect Mult Living                  Review of Systems  All other systems  reviewed and are negative.    Allergies  Shellfish allergy  Home Medications   Current Outpatient Rx  Name Route Sig Dispense Refill  . ALBUTEROL SULFATE HFA 108 (90 BASE) MCG/ACT IN AERS Inhalation Inhale 2 puffs into the lungs every 6 (six) hours as needed. Shortness of breath     . CEPHALEXIN 500 MG PO CAPS Oral Take 1 capsule (500 mg total) by mouth 4 (four) times daily. 40 capsule 0  . HYDROCODONE-ACETAMINOPHEN 5-325 MG PO TABS Oral Take 1 tablet by mouth every 4 (four) hours as needed for pain. 20 tablet 0  . IBUPROFEN 200 MG PO TABS Oral Take 400 mg by mouth every 6 (six) hours as needed. For pain    . ADULT MULTIVITAMIN W/MINERALS CH Oral Take 1 tablet by mouth daily.      BP 128/82  Pulse 87  Temp 98.2 F (36.8 C) (Oral)  Resp 22  SpO2 96%  LMP 02/18/2012  Physical Exam  Nursing note and vitals reviewed. Constitutional:       Obese female in NAD, lying in bed under several blankets  HENT:  Head: Normocephalic and atraumatic.  Mouth/Throat: Oropharynx is clear and moist. No oropharyngeal exudate.  Neck: Normal range of motion. Neck supple. No JVD present. No tracheal deviation present. No thyromegaly present.  Cardiovascular: Normal rate, regular rhythm, normal heart  sounds and intact distal pulses.  Exam reveals no gallop and no friction rub.   No murmur heard. Pulmonary/Chest: Effort normal and breath sounds normal. No stridor. No respiratory distress. She has no wheezes. She has no rales. She exhibits no tenderness.       +coughing  Abdominal: Soft. Bowel sounds are normal. She exhibits no distension and no mass. There is no tenderness. There is no rebound and no guarding.  Musculoskeletal: Normal range of motion. She exhibits edema and tenderness.  Lymphadenopathy:    She has no cervical adenopathy.  Skin: Skin is warm and dry. No rash noted. There is erythema. No pallor.       2 cm ulcerative lesion to left posterior calf.  No induration, no drainage.  Left  lower leg with warmth, erythema c/w cellulitis to mid calf to foot    ED Course  Procedures (including critical care time)  Labs Reviewed  URINALYSIS, ROUTINE W REFLEX MICROSCOPIC - Abnormal; Notable for the following:    Color, Urine AMBER (*)  BIOCHEMICALS MAY BE AFFECTED BY COLOR   APPearance CLOUDY (*)     Ketones, ur 15 (*)     Protein, ur 30 (*)     All other components within normal limits  CBC WITH DIFFERENTIAL - Abnormal; Notable for the following:    WBC 11.0 (*)     Neutrophils Relative 81 (*)     Neutro Abs 8.9 (*)     All other components within normal limits  COMPREHENSIVE METABOLIC PANEL - Abnormal; Notable for the following:    Glucose, Bld 103 (*)     Albumin 3.2 (*)     Total Bilirubin 0.2 (*)     GFR calc non Af Amer 79 (*)     All other components within normal limits  URINE MICROSCOPIC-ADD ON - Abnormal; Notable for the following:    Squamous Epithelial / LPF FEW (*)     All other components within normal limits  PREGNANCY, URINE  CULTURE, BLOOD (ROUTINE X 2)  CULTURE, BLOOD (ROUTINE X 2)  URINE CULTURE  COMPREHENSIVE METABOLIC PANEL  CBC WITH DIFFERENTIAL  CBC  CBC   Dg Chest 2 View  03/05/2012  *RADIOLOGY REPORT*  Clinical Data: Fever and cough  CHEST - 2 VIEW  Comparison: 05/13/2011  Findings: Low volume film without focal airspace consolidation. Pulmonary edema or pleural effusion. The cardiopericardial silhouette is within normal limits for size. Imaged bony structures of the thorax are intact.  IMPRESSION: No acute cardiopulmonary findings.   Original Report Authenticated By: ERIC A. MANSELL, M.D.      1. Cellulitis of left leg   2. Wound of left leg   3. Unspecified asthma       MDM  31 year old female with ulceration of left leg with cellulitis now with fevers. Patient is unreliable to fill outpatient prescriptions, and with systemic symptoms we'll discuss with hospitalist for admission.        Olivia Mackie, MD 03/06/12 [phone]

## 2012-03-05 NOTE — ED Notes (Signed)
Pt c/o chills. States seen for cellulitis yesterday. Warm blanket given

## 2012-03-05 NOTE — ED Notes (Signed)
Return from xray

## 2012-03-05 NOTE — ED Notes (Signed)
The pt has had a elevated temp since yesterday .  She was seen here yesterday for the same this afternoon she had a temp of 103.  She  Had advil 1600.  lmpsept 15th

## 2012-03-05 NOTE — ED Notes (Signed)
The pt has  aheadache also

## 2012-03-05 NOTE — ED Provider Notes (Signed)
Medical screening examination/treatment/procedure(s) were conducted as a shared visit with non-physician practitioner(s) and myself.  I personally evaluated the patient during the encounter Pt is a morbidly obese woman with cellulitis of the left lower leg and a healing venous stasis ulcer.  Recommended Keflex for cellulitis, UNNA boot for venous stasis.  Carleene Cooper III, MD 03/05/12 1228

## 2012-03-06 ENCOUNTER — Encounter (HOSPITAL_COMMUNITY): Payer: Self-pay | Admitting: Internal Medicine

## 2012-03-06 DIAGNOSIS — L02419 Cutaneous abscess of limb, unspecified: Principal | ICD-10-CM

## 2012-03-06 DIAGNOSIS — J45909 Unspecified asthma, uncomplicated: Secondary | ICD-10-CM

## 2012-03-06 DIAGNOSIS — L03119 Cellulitis of unspecified part of limb: Secondary | ICD-10-CM | POA: Diagnosis present

## 2012-03-06 LAB — COMPREHENSIVE METABOLIC PANEL
ALT: 11 U/L (ref 0–35)
AST: 14 U/L (ref 0–37)
Alkaline Phosphatase: 62 U/L (ref 39–117)
Calcium: 9.1 mg/dL (ref 8.4–10.5)
Glucose, Bld: 118 mg/dL — ABNORMAL HIGH (ref 70–99)
Potassium: 3.6 mEq/L (ref 3.5–5.1)
Sodium: 136 mEq/L (ref 135–145)
Total Protein: 7.5 g/dL (ref 6.0–8.3)

## 2012-03-06 LAB — CBC WITH DIFFERENTIAL/PLATELET
Basophils Absolute: 0 10*3/uL (ref 0.0–0.1)
Basophils Relative: 0 % (ref 0–1)
Eosinophils Absolute: 0 10*3/uL (ref 0.0–0.7)
Eosinophils Relative: 0 % (ref 0–5)
HCT: 38.8 % (ref 36.0–46.0)
Hemoglobin: 12.7 g/dL (ref 12.0–15.0)
MCH: 29.9 pg (ref 26.0–34.0)
MCHC: 32.7 g/dL (ref 30.0–36.0)
MCV: 91.3 fL (ref 78.0–100.0)
Monocytes Absolute: 0.8 10*3/uL (ref 0.1–1.0)
Monocytes Relative: 8 % (ref 3–12)
RDW: 13.6 % (ref 11.5–15.5)

## 2012-03-06 LAB — URINE CULTURE: Culture: NO GROWTH

## 2012-03-06 MED ORDER — ONDANSETRON HCL 4 MG/2ML IJ SOLN: 4.0000 mg | Freq: Four times a day (QID) | INTRAMUSCULAR | Status: DC | PRN

## 2012-03-06 MED ORDER — ENOXAPARIN SODIUM 40 MG/0.4ML ~~LOC~~ SOLN
40.0000 mg | SUBCUTANEOUS | Status: DC
  Administered 2012-03-06: 40 mg via SUBCUTANEOUS
  Filled 2012-03-06: qty 0.4

## 2012-03-06 MED ORDER — CEFEPIME HCL 2 G IJ SOLR
2.0000 g | Freq: Three times a day (TID) | INTRAMUSCULAR | Status: DC
  Administered 2012-03-06 – 2012-03-08 (×7): 2 g via INTRAVENOUS
  Filled 2012-03-06 (×10): qty 2

## 2012-03-06 MED ORDER — SODIUM CHLORIDE 0.9 % IV SOLN
INTRAVENOUS | Status: AC
  Administered 2012-03-06 (×2): via INTRAVENOUS

## 2012-03-06 MED ORDER — HYDROCODONE-ACETAMINOPHEN 5-325 MG PO TABS
1.0000 | ORAL_TABLET | ORAL | Status: DC | PRN
  Administered 2012-03-06 – 2012-03-08 (×8): 1 via ORAL
  Filled 2012-03-06 (×9): qty 1

## 2012-03-06 MED ORDER — MORPHINE SULFATE 4 MG/ML IJ SOLN: 4.0000 mg | INTRAMUSCULAR | Status: DC | PRN

## 2012-03-06 MED ORDER — VANCOMYCIN HCL 1000 MG IV SOLR
1500.0000 mg | Freq: Two times a day (BID) | INTRAVENOUS | Status: DC
  Administered 2012-03-06 – 2012-03-08 (×5): 1500 mg via INTRAVENOUS
  Filled 2012-03-06 (×6): qty 1500

## 2012-03-06 MED ORDER — ALBUTEROL SULFATE HFA 108 (90 BASE) MCG/ACT IN AERS
2.0000 | INHALATION_SPRAY | Freq: Four times a day (QID) | RESPIRATORY_TRACT | Status: DC | PRN
  Filled 2012-03-06: qty 6.7

## 2012-03-06 MED ORDER — ONDANSETRON HCL 4 MG PO TABS: 4.0000 mg | ORAL_TABLET | Freq: Four times a day (QID) | ORAL | Status: DC | PRN

## 2012-03-06 MED ORDER — ACETAMINOPHEN 650 MG RE SUPP: 650.0000 mg | Freq: Four times a day (QID) | RECTAL | Status: DC | PRN

## 2012-03-06 MED ORDER — ONDANSETRON HCL 4 MG/2ML IJ SOLN: 4.0000 mg | Freq: Three times a day (TID) | INTRAMUSCULAR | Status: DC | PRN

## 2012-03-06 MED ORDER — ADULT MULTIVITAMIN W/MINERALS CH
1.0000 | ORAL_TABLET | Freq: Every day | ORAL | Status: DC
  Administered 2012-03-06 – 2012-03-08 (×3): 1 via ORAL
  Filled 2012-03-06 (×3): qty 1

## 2012-03-06 MED ORDER — ACETAMINOPHEN 325 MG PO TABS: 650.0000 mg | ORAL_TABLET | Freq: Four times a day (QID) | ORAL | Status: DC | PRN

## 2012-03-06 MED ORDER — ENOXAPARIN SODIUM 80 MG/0.8ML ~~LOC~~ SOLN
0.5000 mg/kg | Freq: Every day | SUBCUTANEOUS | Status: DC
  Filled 2012-03-06: qty 0.8

## 2012-03-06 NOTE — H&P (Signed)
Marissa Joseph is an 31 y.o. female.   Patient was seen and examined on March 06, 2012. PCP - none. Chief Complaint: Left leg pain and fever chills. HPI: 30 year old female with history of asthma had originally come to the ER night before this for left leg pain and was diagnosed with cellulitis and was discharged home on oral antibiotics. Patient had not filled the prescription. She states she slept the whole day. Later she woke up with fever chills and increasing pain and swelling and erythema of the left lower extremity. She came back to the ER. At this time patient has been admitted for IV antibiotics. Patient also has a small ulcer on the posterior aspect of the left leg which patient states has been present for last 2 months. There is no active discharge from the ulcer area. Denies any fall or trauma or insect bite.  Past Medical History  Diagnosis Date  . Asthma   . Fibroids   . Hypertension   . Obesity     History reviewed. No pertinent past surgical history.  Family History  Problem Relation Age of Onset  . Diabetes Father    Social History:  reports that she has been smoking Cigarettes.  She does not have any smokeless tobacco history on file. She reports that she drinks alcohol. She reports that she does not use illicit drugs.  Allergies:  Allergies  Allergen Reactions  . Shellfish Allergy Hives, Shortness Of Breath and Swelling    Medications Prior to Admission  Medication Sig Dispense Refill  . albuterol (PROVENTIL HFA;VENTOLIN HFA) 108 (90 BASE) MCG/ACT inhaler Inhale 2 puffs into the lungs every 6 (six) hours as needed. Shortness of breath       . cephALEXin (KEFLEX) 500 MG capsule Take 1 capsule (500 mg total) by mouth 4 (four) times daily.  40 capsule  0  . HYDROcodone-acetaminophen (NORCO/VICODIN) 5-325 MG per tablet Take 1 tablet by mouth every 4 (four) hours as needed for pain.  20 tablet  0  . ibuprofen (ADVIL,MOTRIN) 200 MG tablet Take 400 mg by mouth every  6 (six) hours as needed. For pain      . Multiple Vitamin (MULTIVITAMIN WITH MINERALS) TABS Take 1 tablet by mouth daily.        Results for orders placed during the hospital encounter of 03/05/12 (from the past 48 hour(s))  CBC WITH DIFFERENTIAL     Status: Abnormal   Collection Time   03/05/12  6:32 PM      Component Value Range Comment   WBC 11.0 (*) 4.0 - 10.5 K/uL    RBC 4.49  3.87 - 5.11 MIL/uL    Hemoglobin 13.5  12.0 - 15.0 g/dL    HCT 40.9  81.1 - 91.4 %    MCV 89.8  78.0 - 100.0 fL    MCH 30.1  26.0 - 34.0 pg    MCHC 33.5  30.0 - 36.0 g/dL    RDW 78.2  95.6 - 21.3 %    Platelets 195  150 - 400 K/uL    Neutrophils Relative 81 (*) 43 - 77 %    Neutro Abs 8.9 (*) 1.7 - 7.7 K/uL    Lymphocytes Relative 12  12 - 46 %    Lymphs Abs 1.3  0.7 - 4.0 K/uL    Monocytes Relative 7  3 - 12 %    Monocytes Absolute 0.8  0.1 - 1.0 K/uL    Eosinophils Relative 0  0 -  5 %    Eosinophils Absolute 0.0  0.0 - 0.7 K/uL    Basophils Relative 0  0 - 1 %    Basophils Absolute 0.0  0.0 - 0.1 K/uL   COMPREHENSIVE METABOLIC PANEL     Status: Abnormal   Collection Time   03/05/12  6:32 PM      Component Value Range Comment   Sodium 138  135 - 145 mEq/L    Potassium 3.6  3.5 - 5.1 mEq/L    Chloride 104  96 - 112 mEq/L    CO2 24  19 - 32 mEq/L    Glucose, Bld 103 (*) 70 - 99 mg/dL    BUN 8  6 - 23 mg/dL    Creatinine, Ser 1.61  0.50 - 1.10 mg/dL    Calcium 9.6  8.4 - 09.6 mg/dL    Total Protein 7.9  6.0 - 8.3 g/dL    Albumin 3.2 (*) 3.5 - 5.2 g/dL    AST 16  0 - 37 U/L    ALT 12  0 - 35 U/L    Alkaline Phosphatase 64  39 - 117 U/L    Total Bilirubin 0.2 (*) 0.3 - 1.2 mg/dL    GFR calc non Af Amer 79 (*) >90 mL/min    GFR calc Af Amer >90  >90 mL/min   URINALYSIS, ROUTINE W REFLEX MICROSCOPIC     Status: Abnormal   Collection Time   03/05/12  6:42 PM      Component Value Range Comment   Color, Urine AMBER (*) YELLOW BIOCHEMICALS MAY BE AFFECTED BY COLOR   APPearance CLOUDY (*) CLEAR     Specific Gravity, Urine 1.025  1.005 - 1.030    pH 7.5  5.0 - 8.0    Glucose, UA NEGATIVE  NEGATIVE mg/dL    Hgb urine dipstick NEGATIVE  NEGATIVE    Bilirubin Urine NEGATIVE  NEGATIVE    Ketones, ur 15 (*) NEGATIVE mg/dL    Protein, ur 30 (*) NEGATIVE mg/dL    Urobilinogen, UA 1.0  0.0 - 1.0 mg/dL    Nitrite NEGATIVE  NEGATIVE    Leukocytes, UA NEGATIVE  NEGATIVE   PREGNANCY, URINE     Status: Normal   Collection Time   03/05/12  6:42 PM      Component Value Range Comment   Preg Test, Ur NEGATIVE  NEGATIVE   URINE MICROSCOPIC-ADD ON     Status: Abnormal   Collection Time   03/05/12  6:42 PM      Component Value Range Comment   Squamous Epithelial / LPF FEW (*) RARE    WBC, UA 0-2  <3 WBC/hpf    RBC / HPF 0-2  <3 RBC/hpf    Bacteria, UA RARE  RARE    Dg Chest 2 View  03/05/2012  *RADIOLOGY REPORT*  Clinical Data: Fever and cough  CHEST - 2 VIEW  Comparison: 05/13/2011  Findings: Low volume film without focal airspace consolidation. Pulmonary edema or pleural effusion. The cardiopericardial silhouette is within normal limits for size. Imaged bony structures of the thorax are intact.  IMPRESSION: No acute cardiopulmonary findings.   Original Report Authenticated By: ERIC A. MANSELL, M.D.     Review of Systems  Constitutional: Positive for fever and chills.  HENT: Negative.   Eyes: Negative.   Respiratory: Negative.   Cardiovascular: Negative.   Gastrointestinal: Negative.   Genitourinary: Negative.   Musculoskeletal:       Left leg pain and  swelling.  Skin: Negative.   Neurological: Negative.   Endo/Heme/Allergies: Negative.   Psychiatric/Behavioral: Negative.     Blood pressure 128/82, pulse 87, temperature 98.2 F (36.8 C), temperature source Oral, resp. rate 22, last menstrual period 02/18/2012, SpO2 96.00%. Physical Exam  Constitutional: She is oriented to person, place, and time. She appears well-developed and well-nourished. No distress.  HENT:  Head: Normocephalic  and atraumatic.  Right Ear: External ear normal.  Left Ear: External ear normal.  Nose: Nose normal.  Mouth/Throat: Oropharynx is clear and moist. No oropharyngeal exudate.  Eyes: Conjunctivae normal are normal. Pupils are equal, round, and reactive to light. Right eye exhibits no discharge. Left eye exhibits no discharge. No scleral icterus.  Neck: Normal range of motion. Neck supple.  Cardiovascular: Normal rate and regular rhythm.   Respiratory: Effort normal and breath sounds normal. No respiratory distress. She has no wheezes. She has no rales.  GI: Soft. Bowel sounds are normal. She exhibits no distension. There is no tenderness. There is no rebound.  Musculoskeletal:       Left leg swollen distally with erythema and an ulcer on the posterior aspect around 2 cm in size with no discharge.  Neurological: She is alert and oriented to person, place, and time.       Moves all extremities.  Skin: She is not diaphoretic.     Assessment/Plan #1. Cellulitis of the left leg with ulcer - patient has been started on vancomycin which we will continue with cefepime. Since patient's left leg is quite edematous we'll get a Doppler to rule out DVT. There is a nonhealing ulcer on the posterior aspect of the left leg for which I have consulted wound team. #2. Bronchial asthma - presently not wheezing. Continue present medications.  CODE STATUS - full code.  Shiesha Jahn N. 03/06/2012, 1:06 AM

## 2012-03-06 NOTE — Progress Notes (Signed)
Orthopedic Tech Progress Note Patient Details:  Marissa Joseph [birth year] [id number]  Ortho Devices Type of Ortho Device: Roland Rack boot Ortho Device/Splint Location: Left unna boot Ortho Device/Splint Interventions: Application   [name], [name] 03/06/2012, 5:25 PM

## 2012-03-06 NOTE — Progress Notes (Signed)
MEDICATION RELATED CONSULT NOTE - INITIAL   Pharmacy Consult for lovenox for dvt prophylaxis   Allergies  Allergen Reactions  . Shellfish Allergy Hives, Shortness Of Breath and Swelling    Patient Measurements: Height: 5' 4.17" (163 cm) Weight: 360 lb 0.2 oz (163.3 kg) (Per 01/06/12 documentation) IBW/kg (Calculated) : 55.1  Adjusted Body Weight:   Vital Signs: Temp: 98.4 F (36.9 C) (10/02 0957) Temp src: Oral (10/02 0957) BP: 140/94 mmHg (10/02 0957) Pulse Rate: 88  (10/02 0957) Intake/Output from previous day: 10/01 0701 - 10/02 0700 In: 746.7 [P.O.:360; I.V.:336.7; IV Piggyback:50] Out: -  Intake/Output from this shift:    Labs:  [facility] 03/06/12 0535 03/05/12 1832 03/04/12 1847  WBC 9.7 11.0* 6.5  HGB 12.7 13.5 13.7  HCT 38.8 40.3 41.0  PLT 208 195 225  APTT -- -- --  CREATININE 0.97 0.96 0.83  LABCREA -- -- --  CREATININE 0.97 0.96 0.83  CREAT24HRUR -- -- --  MG -- -- --  PHOS -- -- --  ALBUMIN 3.1* 3.2* --  PROT 7.5 7.9 --  ALBUMIN 3.1* 3.2* --  AST 14 16 --  ALT 11 12 --  ALKPHOS 62 64 --  BILITOT 0.3 0.2* --  BILIDIR -- -- --  IBILI -- -- --   Estimated Creatinine Clearance: 131.7 ml/min (by C-G formula based on Cr of 0.97).   Microbiology: No results found for this or any previous visit (from the past 720 hour(s)).  Medical History: Past Medical History  Diagnosis Date  . Asthma   . Fibroids   . Hypertension   . Obesity     Medications:  Scheduled:    . ceFEPime (MAXIPIME) IV  2 g Intravenous Q8H  . enoxaparin (LOVENOX) injection  0.5 mg/kg Subcutaneous QHS  . multivitamin with minerals  1 tablet Oral Daily  . vancomycin  1,500 mg Intravenous Q12H  . vancomycin  1,000 mg Intravenous Once  . DISCONTD: enoxaparin (LOVENOX) injection  40 mg Subcutaneous Q24H   Infusions:    . sodium chloride 100 mL/hr at 03/06/12 0138    Assessment: DVT prophylaxis   Plan:  Change lovenox to 0.5 mg/kg q24h for patient with BMI >30.  Will  give 80 mg sq q24h - first tonight at 2300 then qhs.  Drusilla Kanner 03/06/2012,12:58 PM

## 2012-03-06 NOTE — Consult Note (Addendum)
WOC consult Note Reason for Consult: eval wound left lower leg.  Pt reports history of previous ulcerations on her left LE. Both LE have mild edema and faint palpable pulses.  She is currently working and up on her feet working 3rd shift.  She does have some skin changes in the bil. LE, however she also has history of psoriasis and has some related skin changes on the RLE from this.  She reports her legs do swell when she is up.  I have suggested that long term management would include compression hose that she would need to be fitted for in order to prevent further ulcerations. She verbalized understanding. As well if we are able to place compression wraps now, after ABI obtained she will need weekly follow up for change/wound care, I have suggested the wound care center could provide that follow up. Wound type:venous stasis Measurement: 1.0cm x 1.5cm x 0.2cm  Wound ZOX:WRUE and pink, dry Drainage (amount, consistency, odor) minimal noted on sheets, no dressing in place currently Periwound: intact with some erythema  Dressing procedure/placement/frequency: will order silicone foam dressing to be placed and after ABI if >0.8 will order Unna's boot to be placed per orthopedic tech.  Spoke with Dr. Thedore Mins he will order ABI to be performed.  I will follow up later today to verify results and order tx.   WOC will follow along with you for wound management. Daxten Kovalenko [place], [place] [phone]  1355 reviewed ABI results they are WNL, will order Unna's boot for the left lower extremity.  Notified ortho tech of need for application and coban wraps ordered.

## 2012-03-06 NOTE — Progress Notes (Signed)
ANTIBIOTIC CONSULT NOTE - INITIAL  Pharmacy Consult for vancomycin, cefepime Indication: R/o cellulitis  Allergies  Allergen Reactions  . Shellfish Allergy Hives, Shortness Of Breath and Swelling    Patient Measurements: Height: 5' 4.17" (163 cm) Weight: 360 lb 0.2 oz (163.3 kg) (Per 01/06/12 documentation) IBW/kg (Calculated) : 55.1   Vital Signs: Temp: 98.2 F (36.8 C) (10/01 2313) Temp src: Oral (10/01 1824) BP: 128/82 mmHg (10/01 2307) Pulse Rate: 87  (10/01 2307) Intake/Output from previous day:   Intake/Output from this shift:    Labs:  [facility] 03/05/12 1832 03/04/12 1847  WBC 11.0* 6.5  HGB 13.5 13.7  PLT 195 225  LABCREA -- --  CREATININE 0.96 0.83   Estimated Creatinine Clearance: 133.1 ml/min (by C-G formula based on Cr of 0.96). No results found for this basename: VANCOTROUGH:2,VANCOPEAK:2,VANCORANDOM:2,GENTTROUGH:2,GENTPEAK:2,GENTRANDOM:2,TOBRATROUGH:2,TOBRAPEAK:2,TOBRARND:2,AMIKACINPEAK:2,AMIKACINTROU:2,AMIKACIN:2, in the last 72 hours   Microbiology: No results found for this or any previous visit (from the past 720 hour(s)).  Medical History: Past Medical History  Diagnosis Date  . Asthma   . Fibroids   . Hypertension   . Obesity     Medications:  Scheduled:    . enoxaparin (LOVENOX) injection  40 mg Subcutaneous Q24H  . multivitamin with minerals  1 tablet Oral Daily  . vancomycin  1,000 mg Intravenous Once   Assessment: 31 yo female admitted with cellulitis of LLE. Pharmacy consulted to manage vancomycin and cefepime. Patient received vancomycin 1gm IV in ED.   Goal of Therapy:  Vancomycin trough level 10-15 mcg/ml  Plan:  1. Cefepime 2gm IV Q8H.  2. Vancomycin 1500mg  IV Q12H.  Emeline Gins 03/06/2012,1:24 AM

## 2012-03-06 NOTE — Progress Notes (Signed)
VASCULAR LAB PRELIMINARY  ARTERIAL  ABI completed:  ABIs within normal limits.      RIGHT    LEFT    PRESSURE WAVEFORM  PRESSURE WAVEFORM  BRACHIAL 182 Triphasic  BRACHIAL 130 Triphasic  DP 180 Triphasic  DP 176 Triphasic   AT   AT    PT 172 Triphasic  PT 170 Triphasic   PER   PER    GREAT TOE  NA GREAT TOE  NA    RIGHT LEFT  ABI 0.99 0.97     Dat Derksen, RVT 03/06/2012, 12:21 PM

## 2012-03-07 DIAGNOSIS — J309 Allergic rhinitis, unspecified: Secondary | ICD-10-CM

## 2012-03-07 DIAGNOSIS — L259 Unspecified contact dermatitis, unspecified cause: Secondary | ICD-10-CM

## 2012-03-07 DIAGNOSIS — R609 Edema, unspecified: Secondary | ICD-10-CM

## 2012-03-07 DIAGNOSIS — J069 Acute upper respiratory infection, unspecified: Secondary | ICD-10-CM

## 2012-03-07 MED ORDER — WHITE PETROLATUM GEL
Status: AC
  Administered 2012-03-07: 16:00:00
  Filled 2012-03-07: qty 10

## 2012-03-07 MED ORDER — ENOXAPARIN SODIUM 80 MG/0.8ML ~~LOC~~ SOLN
0.5000 mg/kg | SUBCUTANEOUS | Status: DC
  Administered 2012-03-07: 80 mg via SUBCUTANEOUS
  Filled 2012-03-07 (×2): qty 0.8

## 2012-03-07 NOTE — Consult Note (Signed)
WOC nurse follow up Requested to write needed discharge wound care orders  Unna's boot to the left LE, change weekly.  Apply silicone foam dressing under Unna's boot to ulceration of the posterior left calf.   Would suggest follow up in one week with wound care center to evaluate stasis ulcer and to follow venous stasis ulcer prevention.  Promise Weldin [place], [place] [phone]

## 2012-03-07 NOTE — Progress Notes (Signed)
[facility]                                                                                Patient Demographics  Marissa Joseph, is a 31 y.o. female  EAV:[id number]  NWG:[id number]  DOB - 12/22/1980  Admit date - 03/05/2012  Admitting Physician Eduard Clos, MD  Outpatient Primary MD for the patient is DEFAULT,PROVIDER, MD  LOS - 2   Chief Complaint  Patient presents with  . Fever        Assessment & Plan    #1. Cellulitis of the left leg with ulcer - patient has been noncompliant with prescribed oral medications recently, has been started on vancomycin which we will continue with cefepime. Stable ABIs bilaterally, since patient's left leg is quite edematous we'll get a Doppler to rule out DVT. There is a nonhealing ulcer on the posterior aspect of the left leg wound care consult appreciated. We'll request PT, case management to assist with home antibiotics home health and wound care as outpatient.   Unna's boot to the left LE, change weekly. Apply silicone foam dressing under Unna's boot to ulceration of the posterior left calf.  Would suggest follow up in one week with wound care center to evaluate stasis ulcer and to follow venous stasis ulcer prevention.   #2. Bronchial asthma - presently not wheezing. Continue present care.   Code Status: Full  Family Communication: Discussed with patient and her parents bedside  Disposition Plan: Home with home health    Procedures ABIs stable bilaterally   Consults  wound care, PT, case management   Time Spent in minutes   35   Antibiotics    Anti-infectives     Start     Dose/Rate Route Frequency Ordered Stop   03/06/12 0600   vancomycin (VANCOCIN) 1,500 mg in sodium chloride 0.9 % 500 mL IVPB        1,500 mg 250 mL/hr over 120 Minutes Intravenous Every 12 hours 03/06/12 0130     03/06/12 0230   ceFEPIme (MAXIPIME) 2 g in dextrose 5 % 50 mL IVPB        2 g 100 mL/hr over 30 Minutes  Intravenous 3 times per day 03/06/12 0130     03/06/12 0000   vancomycin (VANCOCIN) IVPB 1000 mg/200 mL premix        1,000 mg 200 mL/hr over 60 Minutes Intravenous  Once 03/05/12 2345 03/06/12 0125          Scheduled Meds:   . ceFEPime (MAXIPIME) IV  2 g Intravenous Q8H  . enoxaparin (LOVENOX) injection  0.5 mg/kg Subcutaneous QHS  . multivitamin with minerals  1 tablet Oral Daily  . vancomycin  1,500 mg Intravenous Q12H  . DISCONTD: enoxaparin (LOVENOX) injection  40 mg Subcutaneous Q24H   Continuous Infusions:   . sodium chloride 100 mL/hr at 03/06/12 1412   PRN Meds:.acetaminophen, acetaminophen, albuterol, HYDROcodone-acetaminophen, morphine injection, ondansetron (ZOFRAN) IV, ondansetron   DVT Prophylaxis  Lovenox   Lab Results  Component Value Date   PLT 208 03/06/2012      Susa Raring K M.D on 03/07/2012 at 11:56 AM  Between 7am to 7pm - Pager -  [phone]  After 7pm go to [email or web address] - password [email or web address]  And look for the night coverage person covering for me after hours  [facility] Office  [phone]    Subjective:   Shaundrea Carrigg today has, No headache, No chest pain, No abdominal pain - No Nausea, No new weakness tingling or numbness, No Cough - SOB.   Objective:   Filed Vitals:   03/06/12 1351 03/06/12 1804 03/06/12 2148 03/07/12 0659  BP: 155/93 154/98 149/83 131/57  Pulse: 100 90 92 86  Temp: 98.8 F (37.1 C) 98.6 F (37 C) 99.2 F (37.3 C) 98.7 F (37.1 C)  TempSrc: Oral Oral Oral Oral  Resp: 16 16 16 18   Height:      Weight:      SpO2: 96% 95% 93% 95%    Wt Readings from Last 3 Encounters:  03/06/12 163.3 kg (360 lb 0.2 oz)  01/06/12 163.295 kg (360 lb)  03/24/08 158.305 kg (349 lb)     Intake/Output Summary (Last 24 hours) at 03/07/12 1156 Last data filed at 03/07/12 0900  Gross per 24 hour  Intake   2433 ml  Output      0 ml  Net   2433 ml    Exam Awake Alert, Oriented X 3, No new F.N deficits,  Normal affect [place].AT,PERRAL Supple Neck,No JVD, No cervical lymphadenopathy appriciated.  Symmetrical Chest wall movement, Good air movement bilaterally, CTAB RRR,No Gallops,Rubs or new Murmurs, No Parasternal Heave +ve B.Sounds, Abd Soft, Non tender, No organomegaly appriciated, No rebound - guarding or rigidity. No Cyanosis, Clubbing or edema, No new Rash or bruise , left leg venous stasis ulcer appears stable with mild surrounding cellulitis   Data Review   Micro Results Recent Results (from the past 240 hour(s))  URINE CULTURE     Status: Normal   Collection Time   03/05/12  6:42 PM      Component Value Range Status Comment   Specimen Description URINE, RANDOM   Final    Special Requests ADDED [id number]   Final    Culture  Setup Time 03/06/2012 01:12   Final    Colony Count NO GROWTH   Final    Culture NO GROWTH   Final    Report Status 03/06/2012 FINAL   Final   CULTURE, BLOOD (ROUTINE X 2)     Status: Normal (Preliminary result)   Collection Time   03/05/12 11:30 PM      Component Value Range Status Comment   Specimen Description BLOOD RIGHT ARM   Final    Special Requests BOTTLES DRAWN AEROBIC AND ANAEROBIC 5CC   Final    Culture  Setup Time 03/06/2012 05:59   Final    Culture     Final    Value:        BLOOD CULTURE RECEIVED NO GROWTH TO DATE CULTURE WILL BE HELD FOR 5 DAYS BEFORE ISSUING A FINAL NEGATIVE REPORT   Report Status PENDING   Incomplete   CULTURE, BLOOD (ROUTINE X 2)     Status: Normal (Preliminary result)   Collection Time   03/05/12 11:45 PM      Component Value Range Status Comment   Specimen Description BLOOD RIGHT ARM   Final    Special Requests BOTTLES DRAWN AEROBIC AND ANAEROBIC 5CC   Final    Culture  Setup Time 03/06/2012 05:59   Final    Culture     Final    Value:  BLOOD CULTURE RECEIVED NO GROWTH TO DATE CULTURE WILL BE HELD FOR 5 DAYS BEFORE ISSUING A FINAL NEGATIVE REPORT   Report Status PENDING   Incomplete     Radiology Reports Dg  Chest 2 View  03/05/2012  *RADIOLOGY REPORT*  Clinical Data: Fever and cough  CHEST - 2 VIEW  Comparison: 05/13/2011  Findings: Low volume film without focal airspace consolidation. Pulmonary edema or pleural effusion. The cardiopericardial silhouette is within normal limits for size. Imaged bony structures of the thorax are intact.  IMPRESSION: No acute cardiopulmonary findings.   Original Report Authenticated By: ERIC A. MANSELL, M.D.     CBC  Lab 03/06/12 0535 03/05/12 1832 03/04/12 1847  WBC 9.7 11.0* 6.5  HGB 12.7 13.5 13.7  HCT 38.8 40.3 41.0  PLT 208 195 225  MCV 91.3 89.8 90.9  MCH 29.9 30.1 30.4  MCHC 32.7 33.5 33.4  RDW 13.6 13.4 13.2  LYMPHSABS 1.8 1.3 1.6  MONOABS 0.8 0.8 0.6  EOSABS 0.0 0.0 0.1  BASOSABS 0.0 0.0 0.0  BANDABS -- -- --    Chemistries   Lab 03/06/12 0535 03/05/12 1832 03/04/12 1847  NA 136 138 137  K 3.6 3.6 3.3*  CL 101 104 101  CO2 26 24 28   GLUCOSE 118* 103* 82  BUN 7 8 13   CREATININE 0.97 0.96 0.83  CALCIUM 9.1 9.6 9.7  MG -- -- --  AST 14 16 --  ALT 11 12 --  ALKPHOS 62 64 --  BILITOT 0.3 0.2* --   ------------------------------------------------------------------------------------------------------------------ estimated creatinine clearance is 131.7 ml/min (by C-G formula based on Cr of 0.97). ------------------------------------------------------------------------------------------------------------------ No results found for this basename: HGBA1C:2 in the last 72 hours ------------------------------------------------------------------------------------------------------------------ No results found for this basename: CHOL:2,HDL:2,LDLCALC:2,TRIG:2,CHOLHDL:2,LDLDIRECT:2 in the last 72 hours ------------------------------------------------------------------------------------------------------------------ No results found for this basename: TSH,T4TOTAL,FREET3,T3FREE,THYROIDAB in the last 72  hours ------------------------------------------------------------------------------------------------------------------ No results found for this basename: VITAMINB12:2,FOLATE:2,FERRITIN:2,TIBC:2,IRON:2,RETICCTPCT:2 in the last 72 hours  Coagulation profile No results found for this basename: INR:5,PROTIME:5 in the last 168 hours  No results found for this basename: DDIMER:2 in the last 72 hours  Cardiac Enzymes No results found for this basename: CK:3,CKMB:3,TROPONINI:3,MYOGLOBIN:3 in the last 168 hours ------------------------------------------------------------------------------------------------------------------ No components found with this basename: POCBNP:3

## 2012-03-07 NOTE — Progress Notes (Signed)
VASCULAR LAB PRELIMINARY  PRELIMINARY  PRELIMINARY  PRELIMINARY  Bilateral lower extremity venous Dopplers completed.    Preliminary report:  There is no obvious evidence of DVT or SVT noted in the bilateral lower extremities.  Marissa Joseph, 03/07/2012, 2:46 PM

## 2012-03-07 NOTE — Progress Notes (Signed)
Orthopedic Tech Progress Note Patient Details:  Marissa Joseph Jan 31, 1981 [id number]  Ortho Devices Type of Ortho Device: Unna boot Ortho Device/Splint Location: left leg Ortho Device/Splint Interventions: Application   Nikki Dom 03/07/2012, 9:43 PM

## 2012-03-07 NOTE — Progress Notes (Signed)
ANTIBIOTIC CONSULT NOTE - FOLLOW UP  Pharmacy Consult for Vancomycin Indication: rule out cellulitis  Allergies  Allergen Reactions  . Shellfish Allergy Hives, Shortness Of Breath and Swelling    Patient Measurements: Height: 5' 4.17" (163 cm) Weight: 360 lb 0.2 oz (163.3 kg) (Per 01/06/12 documentation) IBW/kg (Calculated) : 55.1   Labs:  Basename 03/06/12 0535 03/05/12 1832  WBC 9.7 11.0*  HGB 12.7 13.5  PLT 208 195  LABCREA -- --  CREATININE 0.97 0.96   Estimated Creatinine Clearance: 131.7 ml/min (by C-G formula based on Cr of 0.97).  Basename 03/07/12 1750  VANCOTROUGH 8.5*  VANCOPEAK --  Drue Dun --  GENTTROUGH --  GENTPEAK --  GENTRANDOM --  TOBRATROUGH --  TOBRAPEAK --  TOBRARND --  AMIKACINPEAK --  AMIKACINTROU --  AMIKACIN --      Anti-infectives     Start     Dose/Rate Route Frequency Ordered Stop   03/06/12 0600   vancomycin (VANCOCIN) 1,500 mg in sodium chloride 0.9 % 500 mL IVPB        1,500 mg 250 mL/hr over 120 Minutes Intravenous Every 12 hours 03/06/12 0130     03/06/12 0230   ceFEPIme (MAXIPIME) 2 g in dextrose 5 % 50 mL IVPB        2 g 100 mL/hr over 30 Minutes Intravenous 3 times per day 03/06/12 0130     03/06/12 0000   vancomycin (VANCOCIN) IVPB 1000 mg/200 mL premix        1,000 mg 200 mL/hr over 60 Minutes Intravenous  Once 03/05/12 2345 03/06/12 0125          Assessment: Vancomycin trough level = 8.5 mcg / dl  Goal of Therapy:  Vancomycin trough level 10-15 mcg/ml  Plan:  1) Continue vancomycin 1500 mg iv Q 12 hours for now (close to goal and will most likely accumulate) 2) Continue to follow  Thank you. Okey Regal, PharmD  03/07/2012,7:22 PM

## 2012-03-07 NOTE — Progress Notes (Signed)
[facility]  Patient Status: New  [facility] is providing the following services: SN and MSW  If patient discharges after hours, please call [phone].   Marissa Joseph 03/07/2012, 3:14 PM

## 2012-03-07 NOTE — Care Management Note (Signed)
  Page 2 of 2   03/07/2012     2:33:23 PM   CARE MANAGEMENT NOTE 03/07/2012  Patient:  JOANELL, CRESSLER   Account Number:  [id number]  Date Initiated:  03/06/2012  Documentation initiated by:  Ronny Flurry  Subjective/Objective Assessment:   DX: left leg Cellulitis     Action/Plan:   Anticipated DC Date:  03/08/2012   Anticipated DC Plan:  HOME W HOME HEALTH SERVICES         Choice offered to / List presented to:  C-1 Patient        HH arranged  HH-1 RN      [facility] agency  [facility]   Status of service:  In process, will continue to follow Medicare Important Message given?   (If response is "NO", the following Medicare IM given date fields will be blank) Date Medicare IM given:   Date Additional Medicare IM given:    Discharge Disposition:    Per UR Regulation:  Reviewed for med. necessity/level of care/duration of stay  If discussed at Long Length of Stay Meetings, dates discussed:    Comments:  03-07-12  [facility] March 19, 2012 at 1000, patietn needs to arrive at 0945   Gave patient clinic phone numbers to establish a PCP .   Patient is eligible for ZZ fund .  When prescription for PO antibiotics available can be filled from main pharmacy.   Ronny Flurry RN BSN [phone]

## 2012-03-08 DIAGNOSIS — J45901 Unspecified asthma with (acute) exacerbation: Secondary | ICD-10-CM

## 2012-03-08 MED ORDER — HYDROCODONE-ACETAMINOPHEN 5-325 MG PO TABS: 1.0000 | ORAL_TABLET | ORAL | Status: DC | PRN

## 2012-03-08 MED ORDER — AMOXICILLIN-POT CLAVULANATE 875-125 MG PO TABS: 1.0000 | ORAL_TABLET | Freq: Two times a day (BID) | ORAL | Status: DC

## 2012-03-08 MED ORDER — SULFAMETHOXAZOLE-TMP DS 800-160 MG PO TABS
1.0000 | ORAL_TABLET | Freq: Two times a day (BID) | ORAL | Status: DC
  Filled 2012-03-08 (×2): qty 1

## 2012-03-08 MED ORDER — AMOXICILLIN-POT CLAVULANATE 875-125 MG PO TABS
1.0000 | ORAL_TABLET | Freq: Two times a day (BID) | ORAL | Status: DC
  Filled 2012-03-08 (×2): qty 1

## 2012-03-08 MED ORDER — SULFAMETHOXAZOLE-TMP DS 800-160 MG PO TABS: 1.0000 | ORAL_TABLET | Freq: Two times a day (BID) | ORAL | Status: DC

## 2012-03-08 NOTE — Discharge Summary (Addendum)
[facility]                                                                                   ARCHITA LOMELI, is a 31 y.o. female  DOB [birth year]  MRN [id number].  Admission date:  03/05/2012  Discharge Date:  03/08/2012  Primary MD  DEFAULT,PROVIDER, MD  Admitting Physician  Eduard Clos, MD  Admission Diagnosis  Wound of left leg [891.0] Cellulitis of left leg [682.6] leg pain fever  Discharge Diagnosis     Principal Problem:  *Cellulitis, leg Active Problems:  ASTHMA   Past Medical History  Diagnosis Date  . Asthma   . Fibroids   . Hypertension   . Obesity     History reviewed. No pertinent past surgical history.   Recommendations for primary care physician for things to follow:   Please follow left leg cellulitis and wound CBC and BMP closely   Discharge Diagnoses:   Principal Problem:  *Cellulitis, leg Active Problems:  ASTHMA    Discharge Condition: Stable   Diet recommendation: See Discharge Instructions below   Consults wound care and case manager   History of present illness and  Hospital Course:  See H&P, Labs, Consult and Test reports for all details in brief, patient was admitted for left leg cellulitis and small wound on the posterior aspect of her left calf, patient did not take her prescribed outpatient antibiotics which she was recently given, she developed some fever and chills and then presented to the ER where she was diagnosed with left leg cellulitis and small venous stasis ulcer on the posterior aspect of her left leg behind her calf, she had stable ABIs bilaterally along with venous duplex ultrasound, she received couple of days of IV antibiotics with good results currently afebrile normal white count with minimal cellulitis and ulcer healing well, she is set up for outpatient wound care clinic and home health RN for local wound care with Unna boot, she will be placed on oral antibiotics for 2 more weeks  then she will follow with her PCP.  Patient's asthma is in stable condition she had no acute issues, was counseled on weight reduction for her morbid obesity.      Today   Subjective:   Marissa Joseph today has no headache,no chest abdominal pain,no new weakness tingling or numbness, feels much better wants to go home today.    Objective:   Blood pressure 134/70, pulse 84, temperature 98.2 F (36.8 C), temperature source Oral, resp. rate 20, height 5' 4.17" (1.63 m), weight 163.3 kg (360 lb 0.2 oz), last menstrual period 02/18/2012, SpO2 100.00%.   Intake/Output Summary (Last 24 hours) at 03/08/12 1046 Last data filed at 03/08/12 0900  Gross per 24 hour  Intake   1870 ml  Output      0 ml  Net   1870 ml    Exam Awake Alert, Oriented *3, No new F.N deficits, Normal affect [place].AT,PERRAL Supple Neck,No JVD, No cervical lymphadenopathy appriciated.  Symmetrical Chest wall movement, Good air movement bilaterally, CTAB RRR,No Gallops,Rubs or new Murmurs, No Parasternal Heave +ve B.Sounds, Abd Soft, Non tender, No organomegaly appriciated, No rebound -  guarding or rigidity. No Cyanosis, Clubbing or edema, No new Rash or bruise, edema left leg cellulitis with mild redness and warmth, left leg ulcer which is stage II appears to be healing well.  Data Review    Dg Chest 2 View  03/05/2012  *RADIOLOGY REPORT*  Clinical Data: Fever and cough  CHEST - 2 VIEW  Comparison: 05/13/2011  Findings: Low volume film without focal airspace consolidation. Pulmonary edema or pleural effusion. The cardiopericardial silhouette is within normal limits for size. Imaged bony structures of the thorax are intact.  IMPRESSION: No acute cardiopulmonary findings.   Original Report Authenticated By: ERIC A. MANSELL, M.D.     Micro Results      Recent Results (from the past 240 hour(s))  URINE CULTURE     Status: Normal   Collection Time   03/05/12  6:42 PM      Component Value Range Status Comment    Specimen Description URINE, RANDOM   Final    Special Requests ADDED [id number]   Final    Culture  Setup Time 03/06/2012 01:12   Final    Colony Count NO GROWTH   Final    Culture NO GROWTH   Final    Report Status 03/06/2012 FINAL   Final   CULTURE, BLOOD (ROUTINE X 2)     Status: Normal (Preliminary result)   Collection Time   03/05/12 11:30 PM      Component Value Range Status Comment   Specimen Description BLOOD RIGHT ARM   Final    Special Requests BOTTLES DRAWN AEROBIC AND ANAEROBIC 5CC   Final    Culture  Setup Time 03/06/2012 05:59   Final    Culture     Final    Value:        BLOOD CULTURE RECEIVED NO GROWTH TO DATE CULTURE WILL BE HELD FOR 5 DAYS BEFORE ISSUING A FINAL NEGATIVE REPORT   Report Status PENDING   Incomplete   CULTURE, BLOOD (ROUTINE X 2)     Status: Normal (Preliminary result)   Collection Time   03/05/12 11:45 PM      Component Value Range Status Comment   Specimen Description BLOOD RIGHT ARM   Final    Special Requests BOTTLES DRAWN AEROBIC AND ANAEROBIC 5CC   Final    Culture  Setup Time 03/06/2012 05:59   Final    Culture     Final    Value:        BLOOD CULTURE RECEIVED NO GROWTH TO DATE CULTURE WILL BE HELD FOR 5 DAYS BEFORE ISSUING A FINAL NEGATIVE REPORT   Report Status PENDING   Incomplete      CBC w Diff: Lab Results  Component Value Date   WBC 9.7 03/06/2012   HGB 12.7 03/06/2012   HCT 38.8 03/06/2012   PLT 208 03/06/2012   LYMPHOPCT 18 03/06/2012   MONOPCT 8 03/06/2012   EOSPCT 0 03/06/2012   BASOPCT 0 03/06/2012    CMP: Lab Results  Component Value Date   NA 136 03/06/2012   K 3.6 03/06/2012   CL 101 03/06/2012   CO2 26 03/06/2012   BUN 7 03/06/2012   CREATININE 0.97 03/06/2012   PROT 7.5 03/06/2012   ALBUMIN 3.1* 03/06/2012   BILITOT 0.3 03/06/2012   ALKPHOS 62 03/06/2012   AST 14 03/06/2012   ALT 11 03/06/2012  .   Discharge Instructions     Home Health will be provided by [facility]  [phone]    Have appointment at [facility] on Tuesday Mar 19, 2012 at 1000, be there at [facility]   Wound care Center located at [address] [place], [place], Phone number [phone]    Follow with Primary MD as suggested by case manager in 7 days , keep your leg wound clean and dry at all times  Get CBC, CMP, checked 7 days by Primary MD and again as instructed by your Primary MD.   Get Medicines reviewed and adjusted.  Please request your Prim.MD to go over all Hospital Tests and Procedure/Radiological results at the follow up, please get all Hospital records sent to your Prim MD by signing hospital release before you go home.  Activity: As tolerated with Full fall precautions use walker/cane & assistance as needed   Diet:  Heart healthy  For Heart failure patients - Check your Weight same time everyday, if you gain over 2 pounds, or you develop in leg swelling, experience more shortness of breath or chest pain, call your Primary MD immediately. Follow Cardiac Low Salt Diet and 1.8 lit/day fluid restriction.  Disposition Home    If you experience worsening of your admission symptoms, develop shortness of breath, life threatening emergency, suicidal or homicidal thoughts you must seek medical attention immediately by calling 911 or calling your MD immediately  if symptoms less severe.  You Must read complete instructions/literature along with all the possible adverse reactions/side effects for all the Medicines you take and that have been prescribed to you. Take any new Medicines after you have completely understood and accpet all the possible adverse reactions/side effects.   Do not drive and provide baby sitting services if your were admitted for syncope or siezures until you have seen by Primary MD or a Neurologist and advised to do so again.  Do not drive when taking Pain medications.    Do not take more than prescribed Pain, Sleep and Anxiety Medications  Special Instructions: If you have smoked or chewed Tobacco  in  the last 2 yrs please stop smoking, stop any regular Alcohol  and or any Recreational drug use.  Wear Seat belts while driving.    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  Call Susa Raring MD, [facility] [phone] with questions.  Leroy Sea M.D on 03/08/2012,at 2:07 PM  [facility] Office  [phone]     Follow-up Information    Follow up with Primary care physician as suggested by case manager. Schedule an appointment as soon as possible for a visit in 3 days.           Discharge Medications     Medication List     As of 03/08/2012 10:46 AM    START taking these medications         amoxicillin-clavulanate 875-125 MG per tablet   Commonly known as: AUGMENTIN   Take 1 tablet by mouth every 12 (twelve) hours.      sulfamethoxazole-trimethoprim 800-160 MG per tablet   Commonly known as: BACTRIM DS   Take 1 tablet by mouth every 12 (twelve) hours.      CONTINUE taking these medications  albuterol 108 (90 BASE) MCG/ACT inhaler   Commonly known as: PROVENTIL HFA;VENTOLIN HFA      HYDROcodone-acetaminophen 5-325 MG per tablet   Commonly known as: NORCO/VICODIN   Take 1 tablet by mouth every 4 (four) hours as needed for pain.      multivitamin with minerals Tabs      STOP taking these medications         cephALEXin 500 MG capsule   Commonly known as: KEFLEX      ibuprofen 200 MG tablet   Commonly known as: ADVIL,MOTRIN          Where to get your medications    These are the prescriptions that you need to pick up.   You may get these medications from any pharmacy.         amoxicillin-clavulanate 875-125 MG per tablet    HYDROcodone-acetaminophen 5-325 MG per tablet   sulfamethoxazole-trimethoprim 800-160 MG per tablet               Total Time in preparing paper work, data evaluation and todays exam - 35 minutes  Leroy Sea M.D on 03/08/2012 at 10:46 AM  [facility] Office  [phone]

## 2012-03-08 NOTE — Progress Notes (Signed)
Orthopedic Tech Progress Note Patient Details:  Marissa Joseph 26-Dec-1980 [id number]  Ortho Devices Type of Ortho Device: Radio broadcast assistant Ortho Device/Splint Location: left LE Ortho Device/Splint Interventions: Application;Other (comment) Re-applied unna boot dressing for discharge.   Shahd Occhipinti T 03/08/2012, 2:40 PM

## 2012-03-08 NOTE — Progress Notes (Signed)
Patient discharged to home in care of mother. Medications and instructions reviewed with patient and mother with all questions answered. Assessment unchanged from this am. Anabiotics filled in main pharmacy and given to patient. Una boot applied before discharge. Patient is to follow up with wound care center on 10.15.13 and make a new appointment with PCP.

## 2012-03-12 LAB — CULTURE, BLOOD (ROUTINE X 2): Culture: NO GROWTH

## 2012-03-19 ENCOUNTER — Encounter (HOSPITAL_BASED_OUTPATIENT_CLINIC_OR_DEPARTMENT_OTHER): Payer: Self-pay | Attending: General Surgery

## 2012-03-19 DIAGNOSIS — Z79899 Other long term (current) drug therapy: Secondary | ICD-10-CM | POA: Insufficient documentation

## 2012-03-19 DIAGNOSIS — E669 Obesity, unspecified: Secondary | ICD-10-CM | POA: Insufficient documentation

## 2012-03-19 DIAGNOSIS — Z87891 Personal history of nicotine dependence: Secondary | ICD-10-CM | POA: Insufficient documentation

## 2012-03-19 DIAGNOSIS — J45909 Unspecified asthma, uncomplicated: Secondary | ICD-10-CM | POA: Insufficient documentation

## 2012-03-19 DIAGNOSIS — I10 Essential (primary) hypertension: Secondary | ICD-10-CM | POA: Insufficient documentation

## 2012-03-19 DIAGNOSIS — L97809 Non-pressure chronic ulcer of other part of unspecified lower leg with unspecified severity: Secondary | ICD-10-CM | POA: Insufficient documentation

## 2012-03-19 NOTE — H&P (Signed)
NAME:  Marissa Joseph, GOUGH NO.:  [id number]  MEDICAL RECORD NO.:  [id number]  LOCATION:  FOOT                         FACILITY:  [facility]  PHYSICIAN:  Joanne Gavel, M.D.        DATE OF BIRTH:  1980/09/16  DATE OF ADMISSION:  03/19/2012 DATE OF DISCHARGE:                             HISTORY & PHYSICAL   CHIEF COMPLAINT:  Wound, left lower extremity.  HISTORY OF PRESENT ILLNESS:  This is a 31 year old female with an unknown history of venous thrombosis.  She came to the emergency room 10 days ago with cellulitis, was admitted, treated with intravenous antibiotics.  Leg was quite swollen in the course of this treatment and ulcer appeared on the posterior left leg.  PAST MEDICAL HISTORY:  Asthma, fibroid uterus, hypertension, obesity.  PAST SURGICAL HISTORY:  None.  SOCIAL HISTORY:  Cigarettes, she was smoking up until hospital admission approximately 10 days ago.  ALLERGIES:  SHELLFISH.  MEDICATIONS:  Amoxicillin, clavulanic acid, Bactrim, Singulair, albuterol, hydrocodone and multivitamins.  REVIEW OF SYSTEMS:  Essentially as above.  PHYSICAL EXAMINATION:  VITAL SIGNS:  Temperature 97.9, pulse 72, respirations 19, blood pressure 123/83. GENERAL:  Well developed, obese female, in no distress. HEENT:  Cranium normocephalic. CHEST:  Clear. HEART:  Regular rhythm. Examination of extremities reveals good peripheral pulses.  There are no stasis changes on the right.  There are marked stasis dermatitis changes on the left with some swelling.  There is a 0.6 x 1.0 x 0.3 wound on the posterior aspect of the left calf.  This is treated with debridement of slough.  IMPRESSION:  Chronic venous ulcer.  PLAN OF TREATMENT:  We will start with Profore Lite and Santyl, see the patient in 7 days.     Joanne Gavel, M.D.     RA/MEDQ  D:  03/19/2012  T:  03/19/2012  Job:  [id number]

## 2012-04-09 ENCOUNTER — Encounter (HOSPITAL_BASED_OUTPATIENT_CLINIC_OR_DEPARTMENT_OTHER): Payer: Self-pay | Attending: General Surgery

## 2012-04-09 DIAGNOSIS — L97909 Non-pressure chronic ulcer of unspecified part of unspecified lower leg with unspecified severity: Secondary | ICD-10-CM | POA: Insufficient documentation

## 2012-04-09 DIAGNOSIS — I87319 Chronic venous hypertension (idiopathic) with ulcer of unspecified lower extremity: Secondary | ICD-10-CM | POA: Insufficient documentation

## 2012-04-23 ENCOUNTER — Encounter (HOSPITAL_BASED_OUTPATIENT_CLINIC_OR_DEPARTMENT_OTHER): Payer: MEDICAID

## 2012-05-05 DIAGNOSIS — Z22322 Carrier or suspected carrier of Methicillin resistant Staphylococcus aureus: Secondary | ICD-10-CM

## 2012-05-05 HISTORY — DX: Carrier or suspected carrier of methicillin resistant Staphylococcus aureus: Z22.322

## 2012-06-02 ENCOUNTER — Encounter (HOSPITAL_COMMUNITY): Payer: Self-pay | Admitting: *Deleted

## 2012-06-02 ENCOUNTER — Inpatient Hospital Stay (HOSPITAL_COMMUNITY)
Admission: EM | Admit: 2012-06-02 | Discharge: 2012-06-05 | DRG: 202 | Disposition: A | Discharge status: Home / Self Care | Payer: MEDICAID | Attending: Internal Medicine | Admitting: Internal Medicine

## 2012-06-02 ENCOUNTER — Emergency Department (HOSPITAL_COMMUNITY): Payer: Self-pay

## 2012-06-02 DIAGNOSIS — J069 Acute upper respiratory infection, unspecified: Secondary | ICD-10-CM

## 2012-06-02 DIAGNOSIS — J111 Influenza due to unidentified influenza virus with other respiratory manifestations: Secondary | ICD-10-CM | POA: Diagnosis present

## 2012-06-02 DIAGNOSIS — L03119 Cellulitis of unspecified part of limb: Secondary | ICD-10-CM

## 2012-06-02 DIAGNOSIS — Z23 Encounter for immunization: Secondary | ICD-10-CM

## 2012-06-02 DIAGNOSIS — K029 Dental caries, unspecified: Secondary | ICD-10-CM

## 2012-06-02 DIAGNOSIS — L259 Unspecified contact dermatitis, unspecified cause: Secondary | ICD-10-CM

## 2012-06-02 DIAGNOSIS — M79609 Pain in unspecified limb: Secondary | ICD-10-CM

## 2012-06-02 DIAGNOSIS — R059 Cough, unspecified: Secondary | ICD-10-CM

## 2012-06-02 DIAGNOSIS — J309 Allergic rhinitis, unspecified: Secondary | ICD-10-CM

## 2012-06-02 DIAGNOSIS — N946 Dysmenorrhea, unspecified: Secondary | ICD-10-CM

## 2012-06-02 DIAGNOSIS — J45909 Unspecified asthma, uncomplicated: Secondary | ICD-10-CM | POA: Diagnosis present

## 2012-06-02 DIAGNOSIS — D259 Leiomyoma of uterus, unspecified: Secondary | ICD-10-CM

## 2012-06-02 DIAGNOSIS — R05 Cough: Secondary | ICD-10-CM

## 2012-06-02 DIAGNOSIS — R21 Rash and other nonspecific skin eruption: Secondary | ICD-10-CM

## 2012-06-02 DIAGNOSIS — A5400 Gonococcal infection of lower genitourinary tract, unspecified: Secondary | ICD-10-CM

## 2012-06-02 DIAGNOSIS — E669 Obesity, unspecified: Secondary | ICD-10-CM | POA: Diagnosis present

## 2012-06-02 DIAGNOSIS — R609 Edema, unspecified: Secondary | ICD-10-CM

## 2012-06-02 DIAGNOSIS — B353 Tinea pedis: Secondary | ICD-10-CM

## 2012-06-02 DIAGNOSIS — J45901 Unspecified asthma with (acute) exacerbation: Secondary | ICD-10-CM

## 2012-06-02 DIAGNOSIS — J209 Acute bronchitis, unspecified: Principal | ICD-10-CM | POA: Diagnosis present

## 2012-06-02 DIAGNOSIS — G4733 Obstructive sleep apnea (adult) (pediatric): Secondary | ICD-10-CM

## 2012-06-02 DIAGNOSIS — F17200 Nicotine dependence, unspecified, uncomplicated: Secondary | ICD-10-CM | POA: Diagnosis present

## 2012-06-02 DIAGNOSIS — L02419 Cutaneous abscess of limb, unspecified: Secondary | ICD-10-CM | POA: Diagnosis present

## 2012-06-02 LAB — CBC WITH DIFFERENTIAL/PLATELET
Basophils Absolute: 0 10*3/uL (ref 0.0–0.1)
Eosinophils Absolute: 0 10*3/uL (ref 0.0–0.7)
Eosinophils Relative: 0 % (ref 0–5)
HCT: 39 % (ref 36.0–46.0)
Lymphocytes Relative: 7 % — ABNORMAL LOW (ref 12–46)
MCH: 28.3 pg (ref 26.0–34.0)
MCHC: 31.5 g/dL (ref 30.0–36.0)
MCV: 89.9 fL (ref 78.0–100.0)
Monocytes Absolute: 0.7 10*3/uL (ref 0.1–1.0)
RDW: 13.5 % (ref 11.5–15.5)
WBC: 9 10*3/uL (ref 4.0–10.5)

## 2012-06-02 LAB — COMPREHENSIVE METABOLIC PANEL
AST: 17 U/L (ref 0–37)
CO2: 26 mEq/L (ref 19–32)
Calcium: 9.5 mg/dL (ref 8.4–10.5)
Creatinine, Ser: 0.92 mg/dL (ref 0.50–1.10)
GFR calc Af Amer: >90 mL/min (ref >90)
GFR calc non Af Amer: 82 mL/min — ABNORMAL LOW (ref >90)

## 2012-06-02 MED ORDER — VANCOMYCIN HCL IN DEXTROSE 1-5 GM/200ML-% IV SOLN
1000.0000 mg | Freq: Once | INTRAVENOUS | Status: AC
  Administered 2012-06-02: 1000 mg via INTRAVENOUS
  Filled 2012-06-02: qty 200

## 2012-06-02 MED ORDER — ONDANSETRON HCL 4 MG/2ML IJ SOLN: 4.0000 mg | Freq: Three times a day (TID) | INTRAMUSCULAR | Status: AC | PRN

## 2012-06-02 MED ORDER — ACETAMINOPHEN 325 MG PO TABS
650.0000 mg | ORAL_TABLET | Freq: Once | ORAL | Status: AC
  Administered 2012-06-02: 650 mg via ORAL
  Filled 2012-06-02: qty 2

## 2012-06-02 MED ORDER — SODIUM CHLORIDE 0.9 % IV BOLUS (SEPSIS)
1000.0000 mL | Freq: Once | INTRAVENOUS | Status: AC
  Administered 2012-06-02: 1000 mL via INTRAVENOUS

## 2012-06-02 MED ORDER — HYDROMORPHONE HCL PF 1 MG/ML IJ SOLN
1.0000 mg | Freq: Once | INTRAMUSCULAR | Status: AC
  Administered 2012-06-02: 1 mg via INTRAVENOUS
  Filled 2012-06-02: qty 1

## 2012-06-02 MED ORDER — SODIUM CHLORIDE 0.9 % IV SOLN
INTRAVENOUS | Status: AC
  Administered 2012-06-03: 07:00:00 via INTRAVENOUS

## 2012-06-02 MED ORDER — HYDROMORPHONE HCL PF 1 MG/ML IJ SOLN
1.0000 mg | INTRAMUSCULAR | Status: AC | PRN
  Administered 2012-06-03 (×3): 1 mg via INTRAVENOUS
  Filled 2012-06-02 (×3): qty 1

## 2012-06-02 NOTE — ED Notes (Signed)
Patient transported to X-ray 

## 2012-06-02 NOTE — ED Provider Notes (Signed)
History     CSN: [id number]  Arrival date & time 06/02/12  1956   First MD Initiated Contact with Patient 06/02/12 2052      Chief Complaint  Patient presents with  . multiple complaints     (Consider location/radiation/quality/duration/timing/severity/associated sxs/prior treatment) HPI The patient presents with multiple complaints.  She notes that over the past days she has gradually become aware of increasing left leg pain, throbbing, and diffuse.  Concurrently, there has been warmth and new erythema in the distal left lower extremity. Slightly more recently the patient has developed generalized discomfort, achiness, diffuse.  She also complained of mild cough, fever.  Minimal relief with Tylenol.  No sore throat. The patient has previously been hospitalized for cellulitis. Past Medical History  Diagnosis Date  . Asthma   . Fibroids   . Hypertension   . Obesity     History reviewed. No pertinent past surgical history.  Family History  Problem Relation Age of Onset  . Diabetes Father     History  Substance Use Topics  . Smoking status: Current Every Day Smoker    Types: Cigarettes  . Smokeless tobacco: Not on file  . Alcohol Use: Yes     Comment: occ    OB History    Grav Para Term Preterm Abortions TAB SAB Ect Mult Living                  Review of Systems  Constitutional:       Per HPI, otherwise negative  HENT:       Per HPI, otherwise negative  Eyes: Negative.   Respiratory:       Per HPI, otherwise negative  Cardiovascular:       Per HPI, otherwise negative  Gastrointestinal: Positive for abdominal pain and diarrhea. Negative for vomiting.  Genitourinary: Negative.   Musculoskeletal:       Per HPI, otherwise negative  Skin: Negative.   Neurological: Negative for syncope.    Allergies  Shellfish allergy and Avocado  Home Medications   Current Outpatient Rx  Name  Route  Sig  Dispense  Refill  . ACETAMINOPHEN 500 MG PO TABS   Oral  Take 1,000 mg by mouth every 6 (six) hours as needed. For pain or fever         . ALBUTEROL SULFATE HFA 108 (90 BASE) MCG/ACT IN AERS   Inhalation   Inhale 2 puffs into the lungs every 6 (six) hours as needed. Shortness of breath          . MONTELUKAST SODIUM 10 MG PO TABS   Oral   Take 10 mg by mouth at bedtime.         . ADULT MULTIVITAMIN W/MINERALS CH   Oral   Take 1 tablet by mouth daily.           BP 138/78  Pulse 92  Temp 102.2 F (39 C) (Oral)  Resp 24  SpO2 98%  Physical Exam  Nursing note and vitals reviewed. Constitutional: She is oriented to person, place, and time. She appears well-developed and well-nourished.       Morbidly obese young female  HENT:  Head: Normocephalic and atraumatic.  Eyes: Conjunctivae normal and EOM are normal. Pupils are equal, round, and reactive to light.  Cardiovascular: Regular rhythm and intact distal pulses.  Tachycardia present.   Pulmonary/Chest: Effort normal. No stridor. No respiratory distress.  Abdominal: Soft. There is no rebound and no guarding.  Minimal diffuse generalized discomfort, no guarding  Musculoskeletal:       Legs: Neurological: She is alert and oriented to person, place, and time. No cranial nerve deficit. She exhibits normal muscle tone. Coordination normal.  Skin: There is erythema.  Psychiatric: She has a normal mood and affect.    ED Course  Procedures (including critical care time)   Labs Reviewed  CBC WITH DIFFERENTIAL  COMPREHENSIVE METABOLIC PANEL  INFLUENZA PANEL BY PCR   No results found.   No diagnosis found.    MDM  This young female with prior history of cellulitis presents with generalized discomfort, fever, and on exam she is tachycardic, with a left lower extremities consistent with cellulitis.  Given the patient's recent exposure to a H1 N1 positive relative, her ongoing cough, fever there is also concern for co-infection.  This test is pending on  admission.    Gerhard Munch, MD 06/02/12 2317

## 2012-06-02 NOTE — ED Notes (Signed)
The pt has had a headache cold cough with a temp and aching all over that started yesterday.  She had tylenol at 1500 2 tabs.  She is also c/o lt leg throbbing  Since October.  Hx of cellulitis in that leg.

## 2012-06-03 DIAGNOSIS — L02419 Cutaneous abscess of limb, unspecified: Secondary | ICD-10-CM

## 2012-06-03 DIAGNOSIS — R059 Cough, unspecified: Secondary | ICD-10-CM

## 2012-06-03 DIAGNOSIS — J111 Influenza due to unidentified influenza virus with other respiratory manifestations: Secondary | ICD-10-CM | POA: Diagnosis present

## 2012-06-03 DIAGNOSIS — M79609 Pain in unspecified limb: Secondary | ICD-10-CM

## 2012-06-03 DIAGNOSIS — R609 Edema, unspecified: Secondary | ICD-10-CM

## 2012-06-03 DIAGNOSIS — R05 Cough: Secondary | ICD-10-CM

## 2012-06-03 LAB — INFLUENZA PANEL BY PCR (TYPE A & B)
H1N1 flu by pcr: NOT DETECTED
Influenza B By PCR: NEGATIVE

## 2012-06-03 MED ORDER — PNEUMOCOCCAL VAC POLYVALENT 25 MCG/0.5ML IJ INJ
0.5000 mL | INJECTION | INTRAMUSCULAR | Status: AC
  Administered 2012-06-04: 0.5 mL via INTRAMUSCULAR
  Filled 2012-06-03: qty 0.5

## 2012-06-03 MED ORDER — ALBUTEROL SULFATE HFA 108 (90 BASE) MCG/ACT IN AERS
2.0000 | INHALATION_SPRAY | Freq: Four times a day (QID) | RESPIRATORY_TRACT | Status: DC | PRN
  Administered 2012-06-03: 2 via RESPIRATORY_TRACT
  Filled 2012-06-03: qty 6.7

## 2012-06-03 MED ORDER — VANCOMYCIN HCL IN DEXTROSE 1-5 GM/200ML-% IV SOLN
1000.0000 mg | Freq: Three times a day (TID) | INTRAVENOUS | Status: DC
  Administered 2012-06-03 – 2012-06-05 (×7): 1000 mg via INTRAVENOUS
  Filled 2012-06-03 (×9): qty 200

## 2012-06-03 MED ORDER — ENOXAPARIN SODIUM 100 MG/ML ~~LOC~~ SOLN
85.0000 mg | SUBCUTANEOUS | Status: DC
  Administered 2012-06-03 – 2012-06-05 (×3): 85 mg via SUBCUTANEOUS
  Filled 2012-06-03 (×3): qty 1

## 2012-06-03 MED ORDER — ENOXAPARIN SODIUM 40 MG/0.4ML ~~LOC~~ SOLN
40.0000 mg | SUBCUTANEOUS | Status: DC
  Filled 2012-06-03: qty 0.4

## 2012-06-03 MED ORDER — ACETAMINOPHEN 500 MG PO TABS
1000.0000 mg | ORAL_TABLET | Freq: Four times a day (QID) | ORAL | Status: DC | PRN
  Administered 2012-06-03 (×2): 1000 mg via ORAL
  Administered 2012-06-03 – 2012-06-04 (×2): 975 mg via ORAL
  Administered 2012-06-04: 1000 mg via ORAL
  Filled 2012-06-03 (×4): qty 2

## 2012-06-03 MED ORDER — INFLUENZA VIRUS VACC SPLIT PF IM SUSP
0.5000 mL | INTRAMUSCULAR | Status: AC
  Administered 2012-06-04: 0.5 mL via INTRAMUSCULAR
  Filled 2012-06-03: qty 0.5

## 2012-06-03 MED ORDER — VANCOMYCIN HCL 10 G IV SOLR
2000.0000 mg | Freq: Once | INTRAVENOUS | Status: AC
  Administered 2012-06-03: 2000 mg via INTRAVENOUS
  Filled 2012-06-03: qty 2000

## 2012-06-03 MED ORDER — OSELTAMIVIR PHOSPHATE 75 MG PO CAPS
75.0000 mg | ORAL_CAPSULE | Freq: Two times a day (BID) | ORAL | Status: DC
  Administered 2012-06-03: 75 mg via ORAL
  Filled 2012-06-03 (×2): qty 1

## 2012-06-03 MED ORDER — MONTELUKAST SODIUM 10 MG PO TABS
10.0000 mg | ORAL_TABLET | Freq: Every day | ORAL | Status: DC
  Administered 2012-06-03 – 2012-06-04 (×2): 10 mg via ORAL
  Filled 2012-06-03 (×3): qty 1

## 2012-06-03 MED ORDER — ADULT MULTIVITAMIN W/MINERALS CH
1.0000 | ORAL_TABLET | Freq: Every day | ORAL | Status: DC
  Administered 2012-06-03 – 2012-06-05 (×3): 1 via ORAL
  Filled 2012-06-03 (×3): qty 1

## 2012-06-03 NOTE — Progress Notes (Signed)
ANTIBIOTIC CONSULT NOTE - INITIAL  Pharmacy Consult for vancomycin  Indication: R/o cellulitis  Allergies  Allergen Reactions  . Shellfish Allergy Hives, Shortness Of Breath and Swelling  . Avocado     Throat itching and swelling    Patient Measurements: Height: 5\' 4"  (162.6 cm) Weight: 380 lb 8 oz (172.594 kg) IBW/kg (Calculated) : 54.7   Vital Signs: Temp: 99.5 F (37.5 C) (12/30 0100) Temp src: Oral (12/30 0100) BP: 130/94 mmHg (12/30 0100) Pulse Rate: 102  (12/30 0100) Intake/Output from previous day:   Intake/Output from this shift:    Labs:  [facility] 06/02/12 2135  WBC 9.0  HGB 12.3  PLT 199  LABCREA --  CREATININE 0.92   Estimated Creatinine Clearance: 142.5 ml/min (by C-G formula based on Cr of 0.92). No results found for this basename: VANCOTROUGH:2,VANCOPEAK:2,VANCORANDOM:2,GENTTROUGH:2,GENTPEAK:2,GENTRANDOM:2,TOBRATROUGH:2,TOBRAPEAK:2,TOBRARND:2,AMIKACINPEAK:2,AMIKACINTROU:2,AMIKACIN:2, in the last 72 hours   Microbiology: No results found for this or any previous visit (from the past 720 hour(s)).  Medical History: Past Medical History  Diagnosis Date  . Asthma   . Fibroids   . Hypertension   . Obesity     Medications:  Scheduled:    . sodium chloride   Intravenous STAT  . [COMPLETED] acetaminophen  650 mg Oral Once  . [COMPLETED]  HYDROmorphone (DILAUDID) injection  1 mg Intravenous Once  . influenza  inactive virus vaccine  0.5 mL Intramuscular Tomorrow-1000  . oseltamivir  75 mg Oral BID  . pneumococcal 23 valent vaccine  0.5 mL Intramuscular Tomorrow-1000  . [COMPLETED] sodium chloride  1,000 mL Intravenous Once  . [COMPLETED] vancomycin  1,000 mg Intravenous Once   Assessment: 31 yo female admitted with possible cellulitis. Pharmacy to manage vancomycin. In October, vancomycin 1500mg  IV Q12H produced a trough of 8.5 mcg/mL. Patient has already received vancomycin 1gm IV x 1 at ~ 22:00.   Goal of Therapy:  Vancomycin trough level  10-15 mcg/ml  Plan:  1. Vancomycin 2gm IV x 1, then 1000mg  Q8H.   Emeline Gins 06/03/2012,4:14 AM

## 2012-06-03 NOTE — Progress Notes (Signed)
Bilateral:  No obvious evidence of DVT, superficial thrombosis, or Baker's Cyst.  Technically difficult study due to the patient's body habitus.   

## 2012-06-03 NOTE — Progress Notes (Signed)
ANTIBIOTIC CONSULT NOTE - INITIAL  Pharmacy Consult for Zosyn Indication: Cellulitis  Allergies  Allergen Reactions  . Shellfish Allergy Hives, Shortness Of Breath and Swelling  . Avocado     Throat itching and swelling    Patient Measurements: Height: 5\' 4"  (162.6 cm) Weight: 380 lb 8 oz (172.594 kg) IBW/kg (Calculated) : 54.7  Adjusted Body Weight:   Vital Signs: Temp: 97.9 F (36.6 C) (12/30 0944) Temp src: Oral (12/30 0944) BP: 133/75 mmHg (12/30 0944) Pulse Rate: 102  (12/30 0944) Intake/Output from previous day:   Intake/Output from this shift: Total I/O In: 360 [P.O.:360] Out: -   Labs:  [facility] 06/02/12 2135  WBC 9.0  HGB 12.3  PLT 199  LABCREA --  CREATININE 0.92   Estimated Creatinine Clearance: 142.5 ml/min (by C-G formula based on Cr of 0.92). No results found for this basename: VANCOTROUGH:2,VANCOPEAK:2,VANCORANDOM:2,GENTTROUGH:2,GENTPEAK:2,GENTRANDOM:2,TOBRATROUGH:2,TOBRAPEAK:2,TOBRARND:2,AMIKACINPEAK:2,AMIKACINTROU:2,AMIKACIN:2, in the last 72 hours   Microbiology: No results found for this or any previous visit (from the past 720 hour(s)).  Medical History: Past Medical History  Diagnosis Date  . Asthma   . Fibroids   . Hypertension   . Obesity     Medications:  Scheduled:    . sodium chloride   Intravenous STAT  . [COMPLETED] acetaminophen  650 mg Oral Once  . enoxaparin  40 mg Subcutaneous Q24H  . [COMPLETED]  HYDROmorphone (DILAUDID) injection  1 mg Intravenous Once  . influenza  inactive virus vaccine  0.5 mL Intramuscular Tomorrow-1000  . montelukast  10 mg Oral QHS  . multivitamin with minerals  1 tablet Oral Daily  . pneumococcal 23 valent vaccine  0.5 mL Intramuscular Tomorrow-1000  . [COMPLETED] sodium chloride  1,000 mL Intravenous Once  . [COMPLETED] vancomycin  2,000 mg Intravenous Once  . [COMPLETED] vancomycin  1,000 mg Intravenous Once  . vancomycin  1,000 mg Intravenous Q8H  . [DISCONTINUED] oseltamivir  75 mg Oral  BID   Assessment: 31yo female admitted last PM with influenza-like illness and cellulitis.  Vancomycin was initiated, as well as Tamilflu, now to add Zosyn.  Blood cx are pending.    Goal of Therapy:  eradication of infection  Plan:  1.  Zosyn 3.375gm IV q8, infuse over 4 hours 2.  F/U cultures  Marisue Humble, PharmD Clinical Pharmacist [facility]

## 2012-06-03 NOTE — Progress Notes (Signed)
[facility] PROGRESS NOTE  Marissa Joseph UVO:[id number] DOB: 1980/12/27 DOA: 06/02/2012 PCP: Default, Provider, MD  Assessment/Plan: Principal Problem:  *Influenza-like illness Active Problems:  Cellulitis, leg    1. ILI - probably H1N1 influenza virus infection, treatment initiated with tamiflu, tylenol for fever, PCR pending, counseled patient on getting flu shot in future as well. 2. Cellulitis, leg - Patient again has cellulitis of the LLE, vancomycin empirically for now, blood cultures ordered.. This time does not appear to be an ulcer associated with the skin changes, will also order doppler US of the lower extremity again to r/o DVT. 3.   Code Status: full Family Communication: family updated about patient's clinical progress Disposition Plan:  As above    Brief narrative: Marissa Joseph is a 31 y.o. female who presents with 2 complaints. The first is that she has LLE swelling, pain, erythema, and edema, onset over the past couple of days. Quality is throbbing and location is in her lower calf and leg diffusely. Significantly she has a history of cellulitis in this same leg presenting almost identically in October of this year, the only difference is that she does not have an open venous stasis ulcer at this time.  Additionally over the past 24 hours she has been having fever, chills, mild cough, diffuse muscle aches, minimal relief with tylenol, no sore throat. This occurs in the context of known exposure to H1N1 influenza virus (her mother was recently discharged from the hospital after being treated with a confirmed case of this, they live in the same house). She did not get the flu vaccine yet this year.      Consultants:  None  Procedures:  None  Antibiotics:  Vancomycin/Tamiflu  HPI/Subjective: Decreased erythema in the legs  Objective: Filed Vitals:   06/02/12 2358 06/03/12 0100 06/03/12 0550 06/03/12 0944  BP: 126/80 130/94 124/62 133/75   Pulse: 108 102 72 102  Temp: 99.8 F (37.7 C) 99.5 F (37.5 C) 97.9 F (36.6 C) 97.9 F (36.6 C)  TempSrc: Oral Oral  Oral  Resp: 24 22 21 20   Height:  5\' 4"  (1.626 m)    Weight:  172.594 kg (380 lb 8 oz)    SpO2: 97% 99% 98% 95%    Intake/Output Summary (Last 24 hours) at 06/03/12 1010 Last data filed at 06/03/12 0944  Gross per 24 hour  Intake    360 ml  Output      0 ml  Net    360 ml    Exam:  HENT:  Head: Atraumatic.  Nose: Nose normal.  Mouth/Throat: Oropharynx is clear and moist.  Eyes: Conjunctivae are normal. Pupils are equal, round, and reactive to light. No scleral icterus.  Neck: Neck supple. No tracheal deviation present.  Cardiovascular: Normal rate, regular rhythm, normal heart sounds and intact distal pulses.  Pulmonary/Chest: Effort normal and breath sounds normal. No respiratory distress.  Abdominal: Soft. Normal appearance and bowel sounds are normal. She exhibits no distension. There is no tenderness.  Musculoskeletal: She exhibits no edema and no tenderness.  Neurological: She is alert. No cranial nerve deficit.    Data Reviewed: Basic Metabolic Panel:  Lab 06/02/12 7425  NA 132*  K 3.5  CL 97  CO2 26  GLUCOSE 116*  BUN 12  CREATININE 0.92  CALCIUM 9.5  MG --  PHOS --    Liver Function Tests:  Lab 06/02/12 2135  AST 17  ALT 11  ALKPHOS 67  BILITOT 0.3  PROT  8.0  ALBUMIN 3.4*   No results found for this basename: LIPASE:5,AMYLASE:5 in the last 168 hours No results found for this basename: AMMONIA:5 in the last 168 hours  CBC:  Lab 06/02/12 2135  WBC 9.0  NEUTROABS 7.8*  HGB 12.3  HCT 39.0  MCV 89.9  PLT 199    Cardiac Enzymes: No results found for this basename: CKTOTAL:5,CKMB:5,CKMBINDEX:5,TROPONINI:5 in the last 168 hours BNP (last 3 results) No results found for this basename: PROBNP:3 in the last 8760 hours   CBG: No results found for this basename: GLUCAP:5 in the last 168 hours  No results found for this  or any previous visit (from the past 240 hour(s)).   Studies: Dg Chest 2 View  06/02/2012  *RADIOLOGY REPORT*  Clinical Data: Shortness of breath and congestion.  CHEST - 2 VIEW  Comparison: Chest radiograph performed 03/05/2012  Findings: The lungs are well-aerated and clear.  There is no evidence of focal opacification, pleural effusion or pneumothorax.  The heart is normal in size; the mediastinal contour is within normal limits.  No acute osseous abnormalities are seen.  IMPRESSION: No acute cardiopulmonary process seen.   Original Report Authenticated By: Tonia Ghent, M.D.     Scheduled Meds:   . sodium chloride   Intravenous STAT  . influenza  inactive virus vaccine  0.5 mL Intramuscular Tomorrow-1000  . montelukast  10 mg Oral QHS  . multivitamin with minerals  1 tablet Oral Daily  . oseltamivir  75 mg Oral BID  . pneumococcal 23 valent vaccine  0.5 mL Intramuscular Tomorrow-1000  . vancomycin  1,000 mg Intravenous Q8H   Continuous Infusions:   Principal Problem:  *Influenza-like illness Active Problems:  Cellulitis, leg    Time spent: 40 minutes   [facility]  [facility] Pager [phone]. If 8PM-8AM, please contact night-coverage at [email or web address], password [facility] 06/03/2012, 10:10 AM  LOS: 1 day

## 2012-06-03 NOTE — H&P (Signed)
[facility] History and Physical  Marissa Joseph JXB:[id number] DOB: 1981-04-12 DOA: 06/02/2012  Referring physician: ED PCP: Default, Provider, MD  Specialists: None  Chief Complaint: ILI and LLE cellulitis  HPI: Marissa Joseph is a 31 y.o. female who presents with 2 complaints.  The first is that she has LLE swelling, pain, erythema, and edema, onset over the past couple of days.  Quality is throbbing and location is in her lower calf and leg diffusely.  Significantly she has a history of cellulitis in this same leg presenting almost identically in October of this year, the only difference is that she does not have an open venous stasis ulcer at this time.  Additionally over the past 24 hours she has been having fever, chills, mild cough, diffuse muscle aches, minimal relief with tylenol, no sore throat.  This occurs in the context of known exposure to H1N1 influenza virus (her mother was recently discharged from the hospital after being treated with a confirmed case of this, they live in the same house).  She did not get the flu vaccine yet this year.  Hospitalist has been asked to admit for cellulitis and probable influenza.  Review of Systems: 12 systems reviewed and otherwise negative.  Past Medical History  Diagnosis Date  . Asthma   . Fibroids   . Hypertension   . Obesity    History reviewed. No pertinent past surgical history. Social History:  reports that she has been smoking Cigarettes.  She does not have any smokeless tobacco history on file. She reports that she drinks alcohol. She reports that she does not use illicit drugs.   Allergies  Allergen Reactions  . Shellfish Allergy Hives, Shortness Of Breath and Swelling  . Avocado     Throat itching and swelling    Family History  Problem Relation Age of Onset  . Diabetes Father     Prior to Admission medications   Medication Sig Start Date End Date Taking? Authorizing Provider  acetaminophen  (TYLENOL) 500 MG tablet Take 1,000 mg by mouth every 6 (six) hours as needed. For pain or fever   Yes Historical Provider, MD  albuterol (PROVENTIL HFA;VENTOLIN HFA) 108 (90 BASE) MCG/ACT inhaler Inhale 2 puffs into the lungs every 6 (six) hours as needed. Shortness of breath    Yes Historical Provider, MD  montelukast (SINGULAIR) 10 MG tablet Take 10 mg by mouth at bedtime.   Yes Historical Provider, MD  Multiple Vitamin (MULTIVITAMIN WITH MINERALS) TABS Take 1 tablet by mouth daily.   Yes Historical Provider, MD   Physical Exam: Filed Vitals:   06/02/12 2000 06/02/12 2115 06/02/12 2358 06/03/12 0100  BP: 141/68 138/78 126/80 130/94  Pulse:  92 108 102  Temp: 100.2 F (37.9 C) 102.2 F (39 C) 99.8 F (37.7 C) 99.5 F (37.5 C)  TempSrc: Oral Oral Oral Oral  Resp: 24 24 24 22   Height:    5\' 4"  (1.626 m)  Weight:    172.594 kg (380 lb 8 oz)  SpO2: 99% 98% 97% 99%     General:  NAD, resting comfortably in bed  Eyes: PEERLA EOMI  ENT: mucous membranes moist  Neck: supple w/o JVD  Cardiovascular: RRR w/o MRG  Respiratory: CTA B  Abdomen: soft, nt, morbidly obese, bs+  Skin: distal LLE edematous, warm to touch, erythematous, and tender, there is no obvious open ulcer at this time, there appears to be skin changes suspicious for chronic venous stasis as well.  Musculoskeletal: MAE,  full ROM all 4 extremities  Psychiatric: normal tone and affect  Neurologic: AAOx3, grossly non-focal   Labs on Admission:  Basic Metabolic Panel:  Lab 06/02/12 1610  NA 132*  K 3.5  CL 97  CO2 26  GLUCOSE 116*  BUN 12  CREATININE 0.92  CALCIUM 9.5  MG --  PHOS --   Liver Function Tests:  Lab 06/02/12 2135  AST 17  ALT 11  ALKPHOS 67  BILITOT 0.3  PROT 8.0  ALBUMIN 3.4*   No results found for this basename: LIPASE:5,AMYLASE:5 in the last 168 hours No results found for this basename: AMMONIA:5 in the last 168 hours CBC:  Lab 06/02/12 2135  WBC 9.0  NEUTROABS 7.8*  HGB  12.3  HCT 39.0  MCV 89.9  PLT 199   Cardiac Enzymes: No results found for this basename: CKTOTAL:5,CKMB:5,CKMBINDEX:5,TROPONINI:5 in the last 168 hours  BNP (last 3 results) No results found for this basename: PROBNP:3 in the last 8760 hours CBG: No results found for this basename: GLUCAP:5 in the last 168 hours  Radiological Exams on Admission: Dg Chest 2 View  06/02/2012  *RADIOLOGY REPORT*  Clinical Data: Shortness of breath and congestion.  CHEST - 2 VIEW  Comparison: Chest radiograph performed 03/05/2012  Findings: The lungs are well-aerated and clear.  There is no evidence of focal opacification, pleural effusion or pneumothorax.  The heart is normal in size; the mediastinal contour is within normal limits.  No acute osseous abnormalities are seen.  IMPRESSION: No acute cardiopulmonary process seen.   Original Report Authenticated By: Tonia Ghent, M.D.      Assessment/Plan Principal Problem:  *Influenza-like illness Active Problems:  Cellulitis, leg   1. ILI - probably H1N1 influenza virus infection, treatment initiated with tamiflu, tylenol for fever, PCR pending, counseled patient on getting flu shot in future as well. 2. Cellulitis, leg - Patient again has cellulitis of the LLE, vancomycin empirically for now, blood cultures ordered if not already done in ED.  This time does not appear to be an ulcer associated with the skin changes, will also order doppler US of the lower extremity again to r/o DVT.  No consults obtained.  Code Status: Full Code (must indicate code status--if unknown or must be presumed, indicate so) Family Communication: No family in room (indicate person spoken with, if applicable, with phone number if by telephone) Disposition Plan: Admit to obs (indicate anticipated LOS)  Time spent: 70 min  GARDNER, JARED M. [facility] Pager [phone]  If 7PM-7AM, please contact night-coverage [email or web address] Password [email or web address] 06/03/2012, 4:12  AM

## 2012-06-04 ENCOUNTER — Other Ambulatory Visit: Payer: Self-pay

## 2012-06-04 ENCOUNTER — Inpatient Hospital Stay (HOSPITAL_COMMUNITY): Payer: Self-pay

## 2012-06-04 LAB — CBC
HCT: 36.6 % (ref 36.0–46.0)
MCV: 90.1 fL (ref 78.0–100.0)
Platelets: 206 10*3/uL (ref 150–400)
RBC: 4.06 MIL/uL (ref 3.87–5.11)
WBC: 8.9 10*3/uL (ref 4.0–10.5)

## 2012-06-04 MED ORDER — LEVOFLOXACIN IN D5W 500 MG/100ML IV SOLN
500.0000 mg | INTRAVENOUS | Status: DC
  Administered 2012-06-05: 500 mg via INTRAVENOUS
  Filled 2012-06-04 (×2): qty 100

## 2012-06-04 MED ORDER — DOXYCYCLINE HYCLATE 100 MG PO TABS: 100.0000 mg | ORAL_TABLET | Freq: Two times a day (BID) | ORAL | Status: AC

## 2012-06-04 MED ORDER — ALBUTEROL SULFATE (5 MG/ML) 0.5% IN NEBU
2.5000 mg | INHALATION_SOLUTION | RESPIRATORY_TRACT | Status: DC
  Administered 2012-06-04 – 2012-06-05 (×5): 2.5 mg via RESPIRATORY_TRACT
  Filled 2012-06-04 (×4): qty 0.5

## 2012-06-04 MED ORDER — HYDROCODONE-ACETAMINOPHEN 5-500 MG PO TABS: 1.0000 | ORAL_TABLET | Freq: Four times a day (QID) | ORAL | Status: DC | PRN

## 2012-06-04 MED ORDER — METHYLPREDNISOLONE SODIUM SUCC 40 MG IJ SOLR
40.0000 mg | Freq: Four times a day (QID) | INTRAMUSCULAR | Status: DC
  Administered 2012-06-04 – 2012-06-05 (×4): 40 mg via INTRAVENOUS
  Filled 2012-06-04 (×7): qty 1

## 2012-06-04 MED ORDER — GUAIFENESIN-DM 100-10 MG/5ML PO SYRP: 5.0000 mL | ORAL_SOLUTION | Freq: Three times a day (TID) | ORAL | Status: DC | PRN

## 2012-06-04 MED ORDER — IOHEXOL 300 MG/ML  SOLN
100.0000 mL | Freq: Once | INTRAMUSCULAR | Status: AC | PRN
  Administered 2012-06-04: 100 mL via INTRAVENOUS

## 2012-06-04 NOTE — Progress Notes (Signed)
May go back to work in one week aftyer being seen by PCP

## 2012-06-04 NOTE — Progress Notes (Signed)
Utilization review completed. Amadi Frady, RN, BSN. 

## 2012-06-04 NOTE — Discharge Summary (Addendum)
Physician Discharge Summary  Marissa Joseph MRN: [id number] DOB/AGE: [birth year] 31 y.o.  PCP: Default, Provider, MD  PCP to please address need for inhaled corticosteroid and sleep study.   Admit date: 06/02/2012 Discharge date: 06/05/2012  Discharge Diagnoses:     *Influenza-like illness Active Problems:  Cellulitis, leg     Medication List     As of 06/05/2012  1:49 PM    TAKE these medications         acetaminophen 500 MG tablet   Commonly known as: TYLENOL   Take 1,000 mg by mouth every 6 (six) hours as needed. For pain or fever      albuterol 108 (90 BASE) MCG/ACT inhaler   Commonly known as: PROVENTIL HFA;VENTOLIN HFA   Inhale 2 puffs into the lungs every 6 (six) hours as needed. Shortness of breath      doxycycline 100 MG tablet   Commonly known as: VIBRA-TABS   Take 1 tablet (100 mg total) by mouth 2 (two) times daily.      guaiFENesin-dextromethorphan 100-10 MG/5ML syrup   Commonly known as: ROBITUSSIN DM   Take 5 mLs by mouth 3 (three) times daily as needed for cough.      HYDROcodone-acetaminophen 5-500 MG per tablet   Commonly known as: VICODIN   Take 1 tablet by mouth every 6 (six) hours as needed for pain.      montelukast 10 MG tablet   Commonly known as: SINGULAIR   Take 1 tablet (10 mg total) by mouth at bedtime.      multivitamin with minerals Tabs   Take 1 tablet by mouth daily.      predniSONE 20 MG tablet   Commonly known as: DELTASONE   Take 3 tablets (60 mg total) by mouth daily.          Discharge Condition: Stable  Disposition: 01-Home or Self Care   Consults: Stable  Significant Diagnostic Studies: Dg Chest 2 View  06/02/2012  *RADIOLOGY REPORT*  Clinical Data: Shortness of breath and congestion.  CHEST - 2 VIEW  Comparison: Chest radiograph performed 03/05/2012  Findings: The lungs are well-aerated and clear.  There is no evidence of focal opacification, pleural effusion or pneumothorax.  The heart is normal in  size; the mediastinal contour is within normal limits.  No acute osseous abnormalities are seen.  IMPRESSION: No acute cardiopulmonary process seen.   Original Report Authenticated By: Tonia Ghent, M.D.       Microbiology: Recent Results (from the past 240 hour(s))  CULTURE, BLOOD (ROUTINE X 2)     Status: Normal (Preliminary result)   Collection Time   06/03/12 10:05 AM      Component Value Range Status Comment   Specimen Description BLOOD LEFT ANTECUBITAL   Final    Special Requests BOTTLES DRAWN AEROBIC AND ANAEROBIC 10CC   Final    Culture  Setup Time 06/03/2012 18:01   Final    Culture     Final    Value:        BLOOD CULTURE RECEIVED NO GROWTH TO DATE CULTURE WILL BE HELD FOR 5 DAYS BEFORE ISSUING A FINAL NEGATIVE REPORT   Report Status PENDING   Incomplete   CULTURE, BLOOD (ROUTINE X 2)     Status: Normal (Preliminary result)   Collection Time   06/03/12 10:10 AM      Component Value Range Status Comment   Specimen Description BLOOD LEFT ARM   Final    Special Requests  BOTTLES DRAWN AEROBIC AND ANAEROBIC 10CC   Final    Culture  Setup Time 06/03/2012 18:01   Final    Culture     Final    Value:        BLOOD CULTURE RECEIVED NO GROWTH TO DATE CULTURE WILL BE HELD FOR 5 DAYS BEFORE ISSUING A FINAL NEGATIVE REPORT   Report Status PENDING   Incomplete      Labs: Results for orders placed during the hospital encounter of 06/02/12 (from the past 48 hour(s))  CBC     Status: Abnormal   Collection Time   06/04/12  5:40 AM      Component Value Range Comment   WBC 8.9  4.0 - 10.5 K/uL    RBC 4.06  3.87 - 5.11 MIL/uL    Hemoglobin 11.8 (*) 12.0 - 15.0 g/dL    HCT 40.9  81.1 - 91.4 %    MCV 90.1  78.0 - 100.0 fL    MCH 29.1  26.0 - 34.0 pg    MCHC 32.2  30.0 - 36.0 g/dL    RDW 78.2  95.6 - 21.3 %    Platelets 206  150 - 400 K/uL      HPI :* Marissa Joseph is a 31 y.o. female who presents with 2 complaints. The first is that she has LLE swelling, pain, erythema, and  edema, onset over the past couple of days. Quality is throbbing and location is in her lower calf and leg diffusely. Significantly she has a history of cellulitis in this same leg presenting almost identically in October of this year, the only difference is that she does not have an open venous stasis ulcer at this time.  Additionally over the past 24 hours she has been having fever, chills, mild cough, diffuse muscle aches, minimal relief with tylenol, no sore throat. This occurs in the context of known exposure to H1N1 influenza virus (her mother was recently discharged from the hospital after being treated with a confirmed case of this, they live in the same house). She did not get the flu vaccine yet this year.  HOSPITAL COURSE:  #1 cellulitis Patient was given vancomycin and blood cultures are negative Doppler studies were obtained and were found to be negative for DVT The patient will continue with doxycycline for another 10 days and followup with her primary care provider  #2 asthma Patient presented with mild acute asthmatic bronchitis Chest x-ray was negative Influenza panel PCR was negative  Tamiflu was started but discontinued She was started on steroids and should continue a prednisone burst for an additional 5 days.   Patient to continue with her outpatient asthma medications She has been provided with Robitussin-DM for cough and a new Rx for singulair.  PCP to address need for ICS.     Discharge Exam:  Blood pressure 134/86, pulse 80, temperature 98.1 F (36.7 C), temperature source Oral, resp. rate 20, height 5\' 4"  (1.626 m), weight 172.594 kg (380 lb 8 oz), last menstrual period 06/04/2012, SpO2 97.00%.   HENT:  Head: Atraumatic.  Nose: Nose normal.  Mouth/Throat: Oropharynx is clear and moist.  Eyes: Conjunctivae are normal. Pupils are equal, round, and reactive to light. No scleral icterus.  Neck: Neck supple. No tracheal deviation present.  Cardiovascular: Normal  rate, regular rhythm, normal heart sounds and intact distal pulses.  Pulmonary/Chest: Effort normal and breath sounds normal. No respiratory distress.  Abdominal: Soft. Normal appearance and bowel sounds are normal. She exhibits  no distension. There is no tenderness.  Musculoskeletal: She exhibits no edema and no tenderness.  Neurological: She is alert. No cranial nerve deficit.      Discharge Orders    Future Orders Please Complete By Expires   Diet - low sodium heart healthy      Increase activity slowly      Discharge instructions      Comments:   You were hospitalized with cellulitis of the left leg that improved with antibiotics.  You had a CT scan of the leg which did not show any signs of abscess or bone infection or blood clot.  You had some difficulty breathing and were started on steroids.  PLease continue using your inhaler 2 puffs every 4-6 hours until you follow up with your primary care doctor within 1 week.  Please continue steroids, your next dose will be tomorrow morning.  Make sure you take your singulair and ask your doctor about a referral for sleep study and cpap/bipap machine.  Hope you continue to feel better.  Happy New Year   Call MD for:  temperature >100.4      Call MD for:  persistant nausea and vomiting      Call MD for:  severe uncontrolled pain      Call MD for:  difficulty breathing, headache or visual disturbances      Call MD for:  hives      Call MD for:  persistant dizziness or light-headedness      Call MD for:  extreme fatigue         Follow-up Information    Follow up with Family care provider. Schedule an appointment as soon as possible for a visit in 1 week.         Signed: Renae Fickle 06/05/2012, 1:49 PM

## 2012-06-04 NOTE — Progress Notes (Signed)
Patient still complaining of chest tightness and left arm soreness post vaccination of pneumonia and flu vaccine. MD made aware, new order given and noted, VSS .

## 2012-06-04 NOTE — Progress Notes (Addendum)
Patient complaining about her albuterol inhaler not working, feeling tight on her chest.

## 2012-06-05 MED ORDER — PIPERACILLIN-TAZOBACTAM 3.375 G IVPB
3.3750 g | Freq: Three times a day (TID) | INTRAVENOUS | Status: DC
  Filled 2012-06-05 (×3): qty 50

## 2012-06-05 MED ORDER — PREDNISONE 20 MG PO TABS: 60.0000 mg | ORAL_TABLET | Freq: Every day | ORAL | Status: DC

## 2012-06-05 MED ORDER — PIPERACILLIN-TAZOBACTAM 3.375 G IVPB
3.3750 g | INTRAVENOUS | Status: AC
  Administered 2012-06-05: 3.375 g via INTRAVENOUS
  Filled 2012-06-05: qty 50

## 2012-06-05 MED ORDER — MONTELUKAST SODIUM 10 MG PO TABS: 10.0000 mg | ORAL_TABLET | Freq: Every day | ORAL | Status: DC

## 2012-06-05 MED ORDER — ALBUTEROL SULFATE (5 MG/ML) 0.5% IN NEBU: 2.5000 mg | INHALATION_SOLUTION | Freq: Two times a day (BID) | RESPIRATORY_TRACT | Status: DC

## 2012-06-05 NOTE — Progress Notes (Signed)
Discharge instructions reviewed with pt and prescriptions given.  Pt verbalized understanding and had no questions.  Pt discharged in stable condition via wheelchair with family.  Marissa Joseph   

## 2012-06-07 NOTE — Care Management Note (Signed)
    Page 1 of 1   06/07/2012     8:50:19 AM   CARE MANAGEMENT NOTE 06/07/2012  Patient:  Marissa Joseph, Marissa Joseph   Account Number:  [id number]  Date Initiated:  06/07/2012  Documentation initiated by:  Joi Leyva  Subjective/Objective Assessment:     Action/Plan:   Anticipated DC Date:     Anticipated DC Plan:        DC Planning Services  CM consult      Choice offered to / List presented to:             Status of service:  Completed, signed off Medicare Important Message given?   (If response is "NO", the following Medicare IM given date fields will be blank) Date Medicare IM given:   Date Additional Medicare IM given:    Discharge Disposition:  HOME/SELF CARE  Per UR Regulation:    If discussed at Long Length of Stay Meetings, dates discussed:    Comments:  06/05/12 Verdis Prime RN BSN CCM Provided list of free and low-cost clinics to pt per request, reviewed list with her.

## 2012-06-09 LAB — CULTURE, BLOOD (ROUTINE X 2): Culture: NO GROWTH

## 2012-06-11 LAB — CULTURE, BLOOD (ROUTINE X 2): Culture: NO GROWTH

## 2012-08-08 ENCOUNTER — Emergency Department (INDEPENDENT_AMBULATORY_CARE_PROVIDER_SITE_OTHER)
Admission: EM | Admit: 2012-08-08 | Discharge: 2012-08-08 | Disposition: A | Discharge status: Home / Self Care | Payer: Self-pay | Source: Home / Self Care

## 2012-08-08 ENCOUNTER — Encounter (HOSPITAL_COMMUNITY): Payer: Self-pay | Admitting: *Deleted

## 2012-08-08 DIAGNOSIS — H547 Unspecified visual loss: Secondary | ICD-10-CM

## 2012-08-08 NOTE — ED Notes (Signed)
PT REPORTS that eye dr. Rhina Brackett her here to have vision checked because vision is blurry and unable to make 20/20. Pt reports that diabetes is in family - constantly thirsty and urinating

## 2012-08-08 NOTE — ED Provider Notes (Signed)
Medical screening examination/treatment/procedure(s) were performed by resident physician or non-physician practitioner and as supervising physician I was immediately available for consultation/collaboration.   Barkley Bruns MD.   Linna Hoff, MD 08/08/12 Windell Moment

## 2012-08-08 NOTE — ED Provider Notes (Signed)
History     CSN: [id number]  Arrival date & time 08/08/12  1355   None     Chief Complaint  Patient presents with  . Loss of Vision    (Consider location/radiation/quality/duration/timing/severity/associated sxs/prior treatment) HPI Comments: 32 year old morbidly obese female with to see her optometrist today after she had been having visual acuity changes of the past 4-5 months. She had a change in [place] prescriptions but she states it is too strong. She went back to her other glasses and they are not correct either. After seeing her optometrist today he suggested that she have a blood sugar drawn to check for diabetes. Other symptoms include polyuria and polydipsia.   Past Medical History  Diagnosis Date  . Asthma   . Fibroids   . Hypertension   . Obesity     History reviewed. No pertinent past surgical history.  Family History  Problem Relation Age of Onset  . Diabetes Father     History  Substance Use Topics  . Smoking status: Current Every Day Smoker    Types: Cigarettes  . Smokeless tobacco: Not on file  . Alcohol Use: Yes     Comment: occ    OB History   Grav Para Term Preterm Abortions TAB SAB Ect Mult Living                  Review of Systems  Constitutional: Negative for fever, activity change and fatigue.  HENT: Negative for hearing loss, ear pain, congestion, sore throat, rhinorrhea, trouble swallowing, neck pain and postnasal drip.   Eyes: Positive for visual disturbance. Negative for photophobia, pain, discharge, redness and itching.  Respiratory: Negative.   Cardiovascular: Negative.   Gastrointestinal: Negative.   Endocrine: Positive for polydipsia and polyuria.  Musculoskeletal: Negative.   Skin: Negative.   Neurological: Negative.   Psychiatric/Behavioral: Negative.     Allergies  Shellfish allergy and Avocado  Home Medications   Current Outpatient Rx  Name  Route  Sig  Dispense  Refill  . acetaminophen (TYLENOL) 500 MG tablet  Oral   Take 1,000 mg by mouth every 6 (six) hours as needed. For pain or fever         . albuterol (PROVENTIL HFA;VENTOLIN HFA) 108 (90 BASE) MCG/ACT inhaler   Inhalation   Inhale 2 puffs into the lungs every 6 (six) hours as needed. Shortness of breath          . guaiFENesin-dextromethorphan (ROBITUSSIN DM) 100-10 MG/5ML syrup   Oral   Take 5 mLs by mouth 3 (three) times daily as needed for cough.   118 mL   0   . HYDROcodone-acetaminophen (VICODIN) 5-500 MG per tablet   Oral   Take 1 tablet by mouth every 6 (six) hours as needed for pain.   30 tablet   0   . montelukast (SINGULAIR) 10 MG tablet   Oral   Take 1 tablet (10 mg total) by mouth at bedtime.   30 tablet   1   . Multiple Vitamin (MULTIVITAMIN WITH MINERALS) TABS   Oral   Take 1 tablet by mouth daily.         . predniSONE (DELTASONE) 20 MG tablet   Oral   Take 3 tablets (60 mg total) by mouth daily.   15 tablet   0     BP 142/83  Pulse 86  Temp(Src) 97.7 F (36.5 C) (Oral)  SpO2 100%  LMP 07/25/2012  Physical Exam  Nursing note and vitals reviewed.  Constitutional: She is oriented to person, place, and time. No distress.  Eyes: Conjunctivae and EOM are normal.  Neck: Normal range of motion. Neck supple.  Pulmonary/Chest: Effort normal. No respiratory distress.  Musculoskeletal: She exhibits no edema.  Neurological: She is alert and oriented to person, place, and time. She exhibits normal muscle tone.  Skin: Skin is warm and dry. No rash noted.  Psychiatric: She has a normal mood and affect.    ED Course  Procedures (including critical care time)  Labs Reviewed  GLUCOSE, CAPILLARY - Abnormal; Notable for the following:    Glucose-Capillary 100 (*)    All other components within normal limits   No results found.   1. Decreased visual acuity   2. Morbid obesity       MDM  Spot blood sugar is 100. She is at risk for type 2 diabetes mellitus but unable to make that diagnosis today.  Patient is requested to obtain an appointment with [facility] for health maintenance and lab work. Also continue to followup with your optometrist.         Hayden Rasmussen, NP 08/08/12 1519

## 2012-08-20 ENCOUNTER — Encounter (HOSPITAL_COMMUNITY): Payer: Self-pay

## 2012-08-20 ENCOUNTER — Emergency Department (HOSPITAL_COMMUNITY)
Admission: EM | Admit: 2012-08-20 | Discharge: 2012-08-20 | Disposition: A | Discharge status: Home / Self Care | Payer: Self-pay | Source: Home / Self Care

## 2012-08-20 DIAGNOSIS — J45901 Unspecified asthma with (acute) exacerbation: Secondary | ICD-10-CM

## 2012-08-20 DIAGNOSIS — E669 Obesity, unspecified: Secondary | ICD-10-CM

## 2012-08-20 DIAGNOSIS — J45909 Unspecified asthma, uncomplicated: Secondary | ICD-10-CM

## 2012-08-20 DIAGNOSIS — L97209 Non-pressure chronic ulcer of unspecified calf with unspecified severity: Secondary | ICD-10-CM

## 2012-08-20 DIAGNOSIS — G4733 Obstructive sleep apnea (adult) (pediatric): Secondary | ICD-10-CM

## 2012-08-20 DIAGNOSIS — L03119 Cellulitis of unspecified part of limb: Secondary | ICD-10-CM

## 2012-08-20 DIAGNOSIS — L02419 Cutaneous abscess of limb, unspecified: Secondary | ICD-10-CM

## 2012-08-20 LAB — HEMOGLOBIN A1C: Hgb A1c MFr Bld: 5.6 % (ref <5.7)

## 2012-08-20 MED ORDER — FLUTICASONE-SALMETEROL 250-50 MCG/DOSE IN AEPB: 1.0000 | INHALATION_SPRAY | Freq: Two times a day (BID) | RESPIRATORY_TRACT | Status: DC

## 2012-08-20 MED ORDER — ALBUTEROL SULFATE HFA 108 (90 BASE) MCG/ACT IN AERS: 2.0000 | INHALATION_SPRAY | Freq: Four times a day (QID) | RESPIRATORY_TRACT | Status: DC | PRN

## 2012-08-20 MED ORDER — HYDROCODONE-ACETAMINOPHEN 5-500 MG PO TABS: 1.0000 | ORAL_TABLET | Freq: Four times a day (QID) | ORAL | Status: DC | PRN

## 2012-08-20 NOTE — ED Provider Notes (Signed)
History     CSN: [id number]  Arrival date & time 08/20/12  1616   First MD Initiated Contact with Patient 08/20/12 1633      Chief Complaint  Patient presents with  . Establish Care    (Consider location/radiation/quality/duration/timing/severity/associated sxs/prior treatment) HPI Patient is 32 year old female who presents to clinic for regular followup and needs medicine refills for asthma. She describes being in usual state of health, denies chest pain or shortness of breath, no specific abdominal or urinary concerns, no fevers or chills, no recent sicknesses or hospitalizations, no recent asthma flare.  Past Medical History  Diagnosis Date  . Asthma   . Fibroids   . Hypertension   . Obesity     History reviewed. No pertinent past surgical history.  Family History  Problem Relation Age of Onset  . Diabetes Father     History  Substance Use Topics  . Smoking status: Current Every Day Smoker    Types: Cigarettes  . Smokeless tobacco: Not on file  . Alcohol Use: Yes     Comment: occ    OB History   Grav Para Term Preterm Abortions TAB SAB Ect Mult Living                  Review of Systems  Constitutional: Negative for fever, chills, diaphoresis, activity change, appetite change and fatigue.  HENT: Negative for ear pain, nosebleeds, congestion, facial swelling, rhinorrhea, neck pain, neck stiffness and ear discharge.   Eyes: Negative for pain, discharge, redness, itching and visual disturbance.  Respiratory: Negative for cough, choking, chest tightness, shortness of breath, wheezing and stridor.   Cardiovascular: Negative for chest pain, palpitations and leg swelling.  Gastrointestinal: Negative for abdominal distention.  Genitourinary: Negative for dysuria, urgency, frequency, hematuria, flank pain, decreased urine volume, difficulty urinating and dyspareunia.  Musculoskeletal: Negative for back pain, joint swelling, arthralgias and gait problem.   Neurological: Negative for dizziness, tremors, seizures, syncope, facial asymmetry, speech difficulty, weakness, light-headedness, numbness and headaches.  Hematological: Negative for adenopathy. Does not bruise/bleed easily.  Psychiatric/Behavioral: Negative for hallucinations, behavioral problems, confusion, dysphoric mood, decreased concentration and agitation.    Allergies  Shellfish allergy and Avocado  Home Medications   Current Outpatient Rx  Name  Route  Sig  Dispense  Refill  . acetaminophen (TYLENOL) 500 MG tablet   Oral   Take 1,000 mg by mouth every 6 (six) hours as needed. For pain or fever         . albuterol (PROVENTIL HFA;VENTOLIN HFA) 108 (90 BASE) MCG/ACT inhaler   Inhalation   Inhale 2 puffs into the lungs every 6 (six) hours as needed. Shortness of breath   1 Inhaler   3   . Fluticasone-Salmeterol (ADVAIR DISKUS) 250-50 MCG/DOSE AEPB   Inhalation   Inhale 1 puff into the lungs 2 (two) times daily.   60 each   3   . guaiFENesin-dextromethorphan (ROBITUSSIN DM) 100-10 MG/5ML syrup   Oral   Take 5 mLs by mouth 3 (three) times daily as needed for cough.   118 mL   0   . HYDROcodone-acetaminophen (VICODIN) 5-500 MG per tablet   Oral   Take 1 tablet by mouth every 6 (six) hours as needed for pain.   30 tablet   0   . montelukast (SINGULAIR) 10 MG tablet   Oral   Take 1 tablet (10 mg total) by mouth at bedtime.   30 tablet   1   . Multiple Vitamin (MULTIVITAMIN  WITH MINERALS) TABS   Oral   Take 1 tablet by mouth daily.         . predniSONE (DELTASONE) 20 MG tablet   Oral   Take 3 tablets (60 mg total) by mouth daily.   15 tablet   0     BP 145/90  Pulse 80  Temp(Src) 99 F (37.2 C) (Oral)  SpO2 99%  LMP 07/25/2012  Physical Exam  Constitutional: Appears well-developed and well-nourished. No distress.  HENT: Normocephalic. External right and left ear normal. Oropharynx is clear and moist.  Eyes: Conjunctivae and EOM are normal.  PERRLA, no scleral icterus.  Neck: Normal ROM. Neck supple. No JVD. No tracheal deviation. No thyromegaly.  CVS: RRR, S1/S2 +, no murmurs, no gallops, no carotid bruit.  Pulmonary: Effort and breath sounds normal, no stridor, rhonchi, wheezes, rales.  Abdominal: Soft. BS +,  no distension, tenderness, rebound or guarding.  Musculoskeletal: Normal range of motion. No edema and no tenderness.  Lymphadenopathy: No lymphadenopathy noted, cervical, inguinal. Neuro: Alert. Normal reflexes, muscle tone coordination. No cranial nerve deficit. Skin: Skin is warm and dry. No rash noted. Not diaphoretic. No erythema. No pallor.  Psychiatric: Normal mood and affect. Behavior, judgment, thought content normal.    ED Course  Procedures (including critical care time)  Labs Reviewed  HEMOGLOBIN A1C   No results found.   1. ASTHMA - this appears to be stable at this time, patient maintaining oxygen saturation at target range. I have discussed with patient importance of knowing triggering factors and avoiding dose in order to prevent asthma flares, will provide refills on current medications  2. OBESITY NOS - I have discussed with patient exercise and importance of proper healthy food intake, patient verbalized understanding and reports being compliant with recommended exercise and diet    3. HTN - I have discussed with patient normal target blood pressure, she is slightly above the target, I have advised regular blood pressure checked and to call us back if the numbers are persistently higher than 140/90   MDM  Asthma, HTN, refill on medications         Dorothea Ogle, MD 08/20/12 1649

## 2012-08-20 NOTE — ED Notes (Signed)
Patient here to establish primary care doctor

## 2012-08-20 NOTE — Discharge Instructions (Signed)
Asthma Attack Prevention  HOW CAN ASTHMA BE PREVENTED?  Currently, there is no way to prevent asthma from starting. However, you can take steps to control the disease and prevent its symptoms after you have been diagnosed. Learn about your asthma and how to control it. Take an active role to control your asthma by working with your caregiver to create and follow an asthma action plan. An asthma action plan guides you in taking your medicines properly, avoiding factors that make your asthma worse, tracking your level of asthma control, responding to worsening asthma, and seeking emergency care when needed. To track your asthma, keep records of your symptoms, check your peak flow number using a peak flow meter (handheld device that shows how well air moves out of your lungs), and get regular asthma checkups.   Other ways to prevent asthma attacks include:   Use medicines as your caregiver directs.   Identify and avoid things that make your asthma worse (as much as you can).   Keep track of your asthma symptoms and level of control.   Get regular checkups for your asthma.   With your caregiver, write a detailed plan for taking medicines and managing an asthma attack. Then be sure to follow your action plan. Asthma is an ongoing condition that needs regular monitoring and treatment.   Identify and avoid asthma triggers. A number of outdoor allergens and irritants (pollen, mold, cold air, air pollution) can trigger asthma attacks. Find out what causes or makes your asthma worse, and take steps to avoid those triggers (see below).   Monitor your breathing. Learn to recognize warning signs of an attack, such as slight coughing, wheezing or shortness of breath. However, your lung function may already decrease before you notice any signs or symptoms, so regularly measure and record your peak airflow with a home peak flow meter.   Identify and treat attacks early. If you act quickly, you're less likely to have a  severe attack. You will also need less medicine to control your symptoms. When your peak flow measurements decrease and alert you to an upcoming attack, take your medicine as instructed, and immediately stop any activity that may have triggered the attack. If your symptoms do not improve, get medical help.   Pay attention to increasing quick-relief inhaler use. If you find yourself relying on your quick-relief inhaler (such as albuterol), your asthma is not under control. See your caregiver about adjusting your treatment.  IDENTIFY AND CONTROL FACTORS THAT MAKE YOUR ASTHMA WORSE  A number of common things can set off or make your asthma symptoms worse (asthma triggers). Keep track of your asthma symptoms for several weeks, detailing all the environmental and emotional factors that are linked with your asthma. When you have an asthma attack, go back to your asthma diary to see which factor, or combination of factors, might have contributed to it. Once you know what these factors are, you can take steps to control many of them.   Allergies: If you have allergies and asthma, it is important to take asthma prevention steps at home. Asthma attacks (worsening of asthma symptoms) can be triggered by allergies, which can cause temporary increased inflammation of your airways. Minimizing contact with the substance to which you are allergic will help prevent an asthma attack.  Animal Dander:    Some people are allergic to the flakes of skin or dried saliva from animals with fur or feathers. Keep these pets out of your home.   If   you can't keep a pet outdoors, keep the pet out of your bedroom and other sleeping areas at all times, and keep the door closed.   Remove carpets and furniture covered with cloth from your home. If that is not possible, keep the pet away from fabric-covered furniture and carpets.  Dust Mites:   Many people with asthma are allergic to dust mites. Dust mites are tiny bugs that are found in every  home, in mattresses, pillows, carpets, fabric-covered furniture, bedcovers, clothes, stuffed toys, fabric, and other fabric-covered items.   Cover your mattress in a special dust-proof cover.   Cover your pillow in a special dust-proof cover, or wash the pillow each week in hot water. Water must be hotter than 130 F to kill dust mites. Cold or warm water used with detergent and bleach can also be effective.   Wash the sheets and blankets on your bed each week in hot water.   Try not to sleep or lie on cloth-covered cushions.   Call ahead when traveling and ask for a smoke-free hotel room. Bring your own bedding and pillows, in case the hotel only supplies feather pillows and down comforters, which may contain dust mites and cause asthma symptoms.   Remove carpets from your bedroom and those laid on concrete, if you can.   Keep stuffed toys out of the bed, or wash the toys weekly in hot water or cooler water with detergent and bleach.  Cockroaches:   Many people with asthma are allergic to the droppings and remains of cockroaches.   Keep food and garbage in closed containers. Never leave food out.   Use poison baits, traps, powders, gels, or paste (for example, boric acid).   If a spray is used to kill cockroaches, stay out of the room until the odor goes away.  Indoor Mold:   Fix leaky faucets, pipes, or other sources of water that have mold around them.   Clean moldy surfaces with a cleaner that has bleach in it.  Pollen and Outdoor Mold:   When pollen or mold spore counts are high, try to keep your windows closed.   Stay indoors with windows closed from late morning to afternoon, if you can. Pollen and some mold spore counts are highest at that time.   Ask your caregiver whether you need to take or increase anti-inflammatory medicine before your allergy season starts.  Irritants:    Tobacco smoke is an irritant. If you smoke, ask your caregiver how you can quit. Ask family members to quit  smoking, too. Do not allow smoking in your home or car.   If possible, do not use a wood-burning stove, kerosene heater, or fireplace. Minimize exposure to all sources of smoke, including incense, candles, fires, and fireworks.   Try to stay away from strong odors and sprays, such as perfume, talcum powder, hair spray, and paints.   Decrease humidity in your home and use an indoor air cleaning device. Reduce indoor humidity to below 60 percent. Dehumidifiers or central air conditioners can do this.   Try to have someone else vacuum for you once or twice a week, if you can. Stay out of rooms while they are being vacuumed and for a short while afterward.   If you vacuum, use a dust mask from a hardware store, a double-layered or microfilter vacuum cleaner bag, or a vacuum cleaner with a HEPA filter.   Sulfites in foods and beverages can be irritants. Do not drink beer or   wine, or eat dried fruit, processed potatoes, or shrimp if they cause asthma symptoms.   Cold air can trigger an asthma attack. Cover your nose and mouth with a scarf on cold or windy days.   Several health conditions can make asthma more difficult to manage, including runny nose, sinus infections, reflux disease, psychological stress, and sleep apnea. Your caregiver will treat these conditions, as well.   Avoid close contact with people who have a cold or the flu, since your asthma symptoms may get worse if you catch the infection from them. Wash your hands thoroughly after touching items that may have been handled by people with a respiratory infection.   Get a flu shot every year to protect against the flu virus, which often makes asthma worse for days or weeks. Also get a pneumonia shot once every five to 10 years.  Drugs:   Aspirin and other painkillers can cause asthma attacks. 10% to 20% of people with asthma have sensitivity to aspirin or a group of painkillers called non-steroidal anti-inflammatory drugs (NSAIDS), such as ibuprofen  and naproxen. These drugs are used to treat pain and reduce fevers. Asthma attacks caused by any of these medicines can be severe and even fatal. These drugs must be avoided in people who have known aspirin sensitive asthma. Products with acetaminophen are considered safe for people who have asthma. It is important that people with aspirin sensitivity read labels of all over-the-counter drugs used to treat pain, colds, coughs, and fever.   Beta blockers and ACE inhibitors are other drugs which you should discuss with your caregiver, in relation to your asthma.  ALLERGY SKIN TESTING   Ask your asthma caregiver about allergy skin testing or blood testing (RAST test) to identify the allergens to which you are sensitive. If you are found to have allergies, allergy shots (immunotherapy) for asthma may help prevent future allergies and asthma. With allergy shots, small doses of allergens (substances to which you are allergic) are injected under your skin on a regular schedule. Over a period of time, your body may become used to the allergen and less responsive with asthma symptoms. You can also take measures to minimize your exposure to those allergens.  EXERCISE   If you have exercise-induced asthma, or are planning vigorous exercise, or exercise in cold, humid, or dry environments, prevent exercise-induced asthma by following your caregiver's advice regarding asthma treatment before exercising.  Document Released: 05/10/2009 Document Revised: 08/14/2011 Document Reviewed: 05/10/2009  [employer] Patient Information 2013 [employer].

## 2012-10-13 ENCOUNTER — Emergency Department (HOSPITAL_COMMUNITY)
Admission: EM | Admit: 2012-10-13 | Discharge: 2012-10-14 | Disposition: A | Discharge status: Home / Self Care | Payer: Self-pay | Attending: Emergency Medicine | Admitting: Emergency Medicine

## 2012-10-13 DIAGNOSIS — F17200 Nicotine dependence, unspecified, uncomplicated: Secondary | ICD-10-CM | POA: Insufficient documentation

## 2012-10-13 DIAGNOSIS — L03119 Cellulitis of unspecified part of limb: Secondary | ICD-10-CM

## 2012-10-13 DIAGNOSIS — L02419 Cutaneous abscess of limb, unspecified: Secondary | ICD-10-CM | POA: Insufficient documentation

## 2012-10-13 DIAGNOSIS — J45909 Unspecified asthma, uncomplicated: Secondary | ICD-10-CM | POA: Insufficient documentation

## 2012-10-13 DIAGNOSIS — I10 Essential (primary) hypertension: Secondary | ICD-10-CM | POA: Insufficient documentation

## 2012-10-13 DIAGNOSIS — Z8614 Personal history of Methicillin resistant Staphylococcus aureus infection: Secondary | ICD-10-CM | POA: Insufficient documentation

## 2012-10-13 DIAGNOSIS — Z8742 Personal history of other diseases of the female genital tract: Secondary | ICD-10-CM | POA: Insufficient documentation

## 2012-10-13 DIAGNOSIS — IMO0002 Reserved for concepts with insufficient information to code with codable children: Secondary | ICD-10-CM | POA: Insufficient documentation

## 2012-10-13 DIAGNOSIS — Z79899 Other long term (current) drug therapy: Secondary | ICD-10-CM | POA: Insufficient documentation

## 2012-10-13 NOTE — ED Notes (Signed)
Pt c/o leg swelling, and insect bite. Hx: MRSA

## 2012-10-14 MED ORDER — CEPHALEXIN 500 MG PO CAPS: 500.0000 mg | ORAL_CAPSULE | Freq: Four times a day (QID) | ORAL | Status: DC

## 2012-10-14 MED ORDER — SULFAMETHOXAZOLE-TRIMETHOPRIM 800-160 MG PO TABS: 1.0000 | ORAL_TABLET | Freq: Two times a day (BID) | ORAL | Status: DC

## 2012-10-14 NOTE — ED Provider Notes (Signed)
History     CSN: [id number]  Arrival date & time 10/13/12  2121   First MD Initiated Contact with Patient 10/14/12 0012      Chief Complaint  Patient presents with  . Leg Swelling   HPI  History provided by the patient. Patient is a 32 year old female with history of hypertension, morbid obesity and asthma who presents with complaints of increased swelling and redness to her left lower leg. She does also report a past history of having MRSA and cellulitis infection to her lower extremities in the past. There is one small area with slight nodule that is tender near the shin. Symptoms first began 2 days ago. She is unsure if she was Bitten by an insect. Denies any bleeding or drainage from the area. Denies any associated fever, chills or sweats. No chest pain or shortness of breath. No other aggravating or alleviating factors. No other associated symptoms.    Past Medical History  Diagnosis Date  . Asthma   . Fibroids   . Hypertension   . Obesity     No past surgical history on file.  Family History  Problem Relation Age of Onset  . Diabetes Father     History  Substance Use Topics  . Smoking status: Current Every Day Smoker    Types: Cigarettes  . Smokeless tobacco: Not on file  . Alcohol Use: Yes     Comment: occ    OB History   Grav Para Term Preterm Abortions TAB SAB Ect Mult Living                  Review of Systems  Constitutional: Negative for fever and chills.  Respiratory: Negative for shortness of breath.   Cardiovascular: Negative for chest pain.  All other systems reviewed and are negative.    Allergies  Shellfish allergy and Avocado  Home Medications   Current Outpatient Rx  Name  Route  Sig  Dispense  Refill  . acetaminophen (TYLENOL) 500 MG tablet   Oral   Take 1,000 mg by mouth every 6 (six) hours as needed. For pain or fever         . albuterol (PROVENTIL HFA;VENTOLIN HFA) 108 (90 BASE) MCG/ACT inhaler   Inhalation   Inhale 2 puffs  into the lungs every 6 (six) hours as needed. Shortness of breath   1 Inhaler   3   . Fluticasone-Salmeterol (ADVAIR DISKUS) 250-50 MCG/DOSE AEPB   Inhalation   Inhale 1 puff into the lungs 2 (two) times daily.   60 each   3   . guaiFENesin-dextromethorphan (ROBITUSSIN DM) 100-10 MG/5ML syrup   Oral   Take 5 mLs by mouth 3 (three) times daily as needed for cough.   118 mL   0   . HYDROcodone-acetaminophen (VICODIN) 5-500 MG per tablet   Oral   Take 1 tablet by mouth every 6 (six) hours as needed for pain.   30 tablet   0   . montelukast (SINGULAIR) 10 MG tablet   Oral   Take 1 tablet (10 mg total) by mouth at bedtime.   30 tablet   1   . Multiple Vitamin (MULTIVITAMIN WITH MINERALS) TABS   Oral   Take 1 tablet by mouth daily.         . predniSONE (DELTASONE) 20 MG tablet   Oral   Take 3 tablets (60 mg total) by mouth daily.   15 tablet   0     BP  144/107  Pulse 94  Temp(Src) 97.9 F (36.6 C) (Oral)  Resp 17  SpO2 96%  Physical Exam  Nursing note and vitals reviewed. Constitutional: She is oriented to person, place, and time. She appears well-developed and well-nourished. No distress.  Morbidly obese  HENT:  Head: Normocephalic.  Cardiovascular: Normal rate and regular rhythm.   Pulmonary/Chest: Effort normal and breath sounds normal. No respiratory distress. She has no wheezes. She has no rales.  Musculoskeletal: She exhibits edema.  Swelling of both lower extremities. There is increased swelling to left lower extremity. There is mild increased warmth to touch. There is slight erythema especially to the upper anterior portion of the leg with a small pustule forming. No significant fluctuance or signs of drainable abscess at this time.  Neurological: She is alert and oriented to person, place, and time.  Skin: Skin is warm and dry.  Psychiatric: She has a normal mood and affect. Her behavior is normal.    ED Course  Procedures      1. Cellulitis  of lower leg       MDM  12:15 AM patient seen and evaluated. Patient resting comfortably appears well and nontoxic. No acute distress.  Patient afebrile. Unremarkable vitals. Patient has had past history of cellulitis the similar lower extremity. She prefers to try outpatient treatment. No concerning or emergent conditions requiring admission. Will place patient on Bactrim and Keflex. 24-48 hour recheck plan.        Angus Seller, PA-C 10/14/12 [phone]

## 2012-10-14 NOTE — ED Provider Notes (Signed)
Medical screening examination/treatment/procedure(s) were performed by non-physician practitioner and as supervising physician I was immediately available for consultation/collaboration.   Charles B. Bernette Mayers, MD 10/14/12 [phone]

## 2012-10-14 NOTE — ED Notes (Signed)
Pt discharged.Vital signs stable and GCS 15 

## 2012-11-11 ENCOUNTER — Encounter (HOSPITAL_COMMUNITY): Payer: Self-pay | Admitting: Emergency Medicine

## 2012-11-11 ENCOUNTER — Emergency Department (HOSPITAL_COMMUNITY)
Admission: EM | Admit: 2012-11-11 | Discharge: 2012-11-11 | Disposition: A | Discharge status: Home / Self Care | Payer: Self-pay | Attending: Emergency Medicine | Admitting: Emergency Medicine

## 2012-11-11 ENCOUNTER — Emergency Department (HOSPITAL_COMMUNITY): Payer: Self-pay

## 2012-11-11 DIAGNOSIS — R05 Cough: Secondary | ICD-10-CM | POA: Insufficient documentation

## 2012-11-11 DIAGNOSIS — J45901 Unspecified asthma with (acute) exacerbation: Secondary | ICD-10-CM | POA: Insufficient documentation

## 2012-11-11 DIAGNOSIS — Z8559 Personal history of malignant neoplasm of other urinary tract organ: Secondary | ICD-10-CM | POA: Insufficient documentation

## 2012-11-11 DIAGNOSIS — Z79899 Other long term (current) drug therapy: Secondary | ICD-10-CM | POA: Insufficient documentation

## 2012-11-11 DIAGNOSIS — I10 Essential (primary) hypertension: Secondary | ICD-10-CM | POA: Insufficient documentation

## 2012-11-11 DIAGNOSIS — R0789 Other chest pain: Secondary | ICD-10-CM | POA: Insufficient documentation

## 2012-11-11 DIAGNOSIS — E669 Obesity, unspecified: Secondary | ICD-10-CM | POA: Insufficient documentation

## 2012-11-11 DIAGNOSIS — R0602 Shortness of breath: Secondary | ICD-10-CM | POA: Insufficient documentation

## 2012-11-11 DIAGNOSIS — J3489 Other specified disorders of nose and nasal sinuses: Secondary | ICD-10-CM | POA: Insufficient documentation

## 2012-11-11 DIAGNOSIS — R059 Cough, unspecified: Secondary | ICD-10-CM | POA: Insufficient documentation

## 2012-11-11 DIAGNOSIS — J4541 Moderate persistent asthma with (acute) exacerbation: Secondary | ICD-10-CM

## 2012-11-11 DIAGNOSIS — F17200 Nicotine dependence, unspecified, uncomplicated: Secondary | ICD-10-CM | POA: Insufficient documentation

## 2012-11-11 MED ORDER — IPRATROPIUM BROMIDE 0.02 % IN SOLN
0.5000 mg | Freq: Once | RESPIRATORY_TRACT | Status: AC
  Administered 2012-11-11: 0.5 mg via RESPIRATORY_TRACT
  Filled 2012-11-11: qty 2.5

## 2012-11-11 MED ORDER — PREDNISONE 20 MG PO TABS: 60.0000 mg | ORAL_TABLET | Freq: Every day | ORAL | Status: DC

## 2012-11-11 MED ORDER — ALBUTEROL SULFATE (5 MG/ML) 0.5% IN NEBU
5.0000 mg | INHALATION_SOLUTION | Freq: Once | RESPIRATORY_TRACT | Status: AC
  Administered 2012-11-11: 5 mg via RESPIRATORY_TRACT
  Filled 2012-11-11: qty 1

## 2012-11-11 MED ORDER — ALBUTEROL SULFATE HFA 108 (90 BASE) MCG/ACT IN AERS
2.0000 | INHALATION_SPRAY | RESPIRATORY_TRACT | Status: DC | PRN
  Administered 2012-11-11: 2 via RESPIRATORY_TRACT
  Filled 2012-11-11: qty 6.7

## 2012-11-11 MED ORDER — PREDNISONE 20 MG PO TABS
60.0000 mg | ORAL_TABLET | Freq: Once | ORAL | Status: AC
  Administered 2012-11-11: 60 mg via ORAL
  Filled 2012-11-11: qty 3

## 2012-11-11 NOTE — ED Provider Notes (Signed)
History    This chart was scribed for [name], a non-physician practitioner working with [name], MD by [name], ED Scribe. This patient was seen in room WTR9/WTR9 and the patient's care was started at 1759.      CSN: [id number]  Arrival date & time 11/11/12  1553   First MD Initiated Contact with Patient 11/11/12 1618      Chief Complaint  Patient presents with  . Asthma    (Consider location/radiation/quality/duration/timing/severity/associated sxs/prior treatment) The history is provided by the patient.  HPI Comments: Marissa Joseph is a 32 y.o. female with history of Asthma who presents to the Emergency Department complaining of worsening constant asthma exacerbation onset yesterday. Reports associated occasional productive cough, wheezing, shortness of breath, congestion, and chest tightness. Reports symptoms are aggravated when she "gets too hot." Reports she ran out of albuterol inhaler today, but the Albuterol alleviated symptoms temporarily. Reports hx of asthma, and morbid obesity. Recently quit smoking two weeks ago. Denies hx of hospitalizations for asthma.     Past Medical History  Diagnosis Date  . Asthma   . Fibroids   . Hypertension   . Obesity     History reviewed. No pertinent past surgical history.  Family History  Problem Relation Age of Onset  . Diabetes Father     History  Substance Use Topics  . Smoking status: Current Every Day Smoker    Types: Cigarettes  . Smokeless tobacco: Not on file  . Alcohol Use: Yes     Comment: occ    OB History   Grav Para Term Preterm Abortions TAB SAB Ect Mult Living                  Review of Systems  Constitutional: Negative for fever.  HENT: Positive for congestion.   Respiratory: Positive for cough, shortness of breath and wheezing.   Cardiovascular: Negative for chest pain.  Psychiatric/Behavioral: Negative for confusion.    Allergies  Shellfish allergy and  Avocado  Home Medications   Current Outpatient Rx  Name  Route  Sig  Dispense  Refill  . albuterol (PROVENTIL HFA;VENTOLIN HFA) 108 (90 BASE) MCG/ACT inhaler   Inhalation   Inhale 2 puffs into the lungs every 6 (six) hours as needed. Shortness of breath   1 Inhaler   3   . ibuprofen (ADVIL,MOTRIN) 200 MG tablet   Oral   Take 200 mg by mouth every 6 (six) hours as needed for pain.         . montelukast (SINGULAIR) 10 MG tablet   Oral   Take 1 tablet (10 mg total) by mouth at bedtime.   30 tablet   1   . Multiple Vitamin (MULTIVITAMIN WITH MINERALS) TABS   Oral   Take 1 tablet by mouth daily.         . Fluticasone-Salmeterol (ADVAIR DISKUS) 250-50 MCG/DOSE AEPB   Inhalation   Inhale 1 puff into the lungs 2 (two) times daily.   60 each   3     BP 157/100  Pulse 97  Temp(Src) 98.8 F (37.1 C) (Oral)  Resp 21  SpO2 93%  LMP 10/17/2012  Physical Exam  Nursing note and vitals reviewed. Constitutional: She is oriented to person, place, and time. She appears well-developed and well-nourished. No distress.  Morbid obesity   HENT:  Head: Normocephalic and atraumatic.  Eyes: EOM are normal.  Neck: Neck supple. No tracheal deviation present.  Cardiovascular: Normal rate.  Pulmonary/Chest: Effort normal. No accessory muscle usage. Not tachypneic. No respiratory distress. She has no decreased breath sounds (mildly ). She has no wheezes. She has no rales. She exhibits no tenderness.  Musculoskeletal: Normal range of motion.  Neurological: She is alert and oriented to person, place, and time.  Skin: Skin is warm and dry.  Psychiatric: She has a normal mood and affect. Her behavior is normal.    ED Course  Procedures (including critical care time) Medications  albuterol (PROVENTIL) (5 MG/ML) 0.5% nebulizer solution 5 mg (5 mg Nebulization Given 11/11/12 1624)  predniSONE (DELTASONE) tablet 60 mg (60 mg Oral Given 11/11/12 1902)  ipratropium (ATROVENT) nebulizer solution  0.5 mg (0.5 mg Nebulization Given 11/11/12 1826)  albuterol (PROVENTIL) (5 MG/ML) 0.5% nebulizer solution 5 mg (5 mg Nebulization Given 11/11/12 1826)   6:49 PM Pt states she feels much better and is ready to be discharged. Pulse ox 97% while in exam room.   Labs Reviewed - No data to display Dg Chest 2 View (if Patient Has Fever And/or Copd)  11/11/2012   *RADIOLOGY REPORT*  Clinical Data: Chest pain, shortness of breath  CHEST - 2 VIEW  Comparison: 06/04/2012  Findings: Heart size upper limits normal. Relatively low lung volumes with mild   perihilar interstitial prominence.  No effusion.  Regional bones unremarkable.  IMPRESSION:  Low volumes.  No definite acute disease.   Original Report Authenticated By: D. Andria Rhein, MD     No diagnosis found.    MDM  Patient ambulated in ED with O2 saturations maintained >90, no current signs of respiratory distress. Lung exam improved after nebulizer treatment. Prednisone given in the ED and pt will bd dc with 5 day burst. Pt states they are breathing at baseline. Pt has been instructed to continue using prescribed medications and to speak with PCP about today's exacerbation.   I personally performed the services described in this documentation, which was scribed in my presence. The recorded information has been reviewed and is accurate.    Pascal Lux [place], PA-C 11/11/12 2039

## 2012-11-11 NOTE — ED Notes (Signed)
Pt presenting to ed with c/o asthma exacerbation pt states chest pain with cough only pt states inhaler is not helping and she's out of her inhaler

## 2012-11-14 NOTE — ED Provider Notes (Signed)
Medical screening examination/treatment/procedure(s) were performed by non-physician practitioner and as supervising physician I was immediately available for consultation/collaboration.  Lilianne Delair, MD 11/14/12 0101 

## 2012-12-26 ENCOUNTER — Encounter (HOSPITAL_COMMUNITY): Payer: Self-pay | Admitting: *Deleted

## 2012-12-26 ENCOUNTER — Inpatient Hospital Stay (HOSPITAL_COMMUNITY)
Admission: EM | Admit: 2012-12-26 | Discharge: 2012-12-31 | DRG: 603 | Disposition: A | Discharge status: Home / Self Care | Payer: MEDICAID | Attending: Internal Medicine | Admitting: Internal Medicine

## 2012-12-26 DIAGNOSIS — J45909 Unspecified asthma, uncomplicated: Secondary | ICD-10-CM | POA: Diagnosis present

## 2012-12-26 DIAGNOSIS — E669 Obesity, unspecified: Secondary | ICD-10-CM | POA: Diagnosis present

## 2012-12-26 DIAGNOSIS — IMO0002 Reserved for concepts with insufficient information to code with codable children: Secondary | ICD-10-CM

## 2012-12-26 DIAGNOSIS — R509 Fever, unspecified: Secondary | ICD-10-CM

## 2012-12-26 DIAGNOSIS — I872 Venous insufficiency (chronic) (peripheral): Secondary | ICD-10-CM | POA: Diagnosis present

## 2012-12-26 DIAGNOSIS — K029 Dental caries, unspecified: Secondary | ICD-10-CM

## 2012-12-26 DIAGNOSIS — L03119 Cellulitis of unspecified part of limb: Secondary | ICD-10-CM | POA: Diagnosis present

## 2012-12-26 DIAGNOSIS — F17200 Nicotine dependence, unspecified, uncomplicated: Secondary | ICD-10-CM | POA: Diagnosis present

## 2012-12-26 DIAGNOSIS — L02419 Cutaneous abscess of limb, unspecified: Principal | ICD-10-CM | POA: Diagnosis present

## 2012-12-26 DIAGNOSIS — I10 Essential (primary) hypertension: Secondary | ICD-10-CM | POA: Diagnosis present

## 2012-12-26 DIAGNOSIS — R609 Edema, unspecified: Secondary | ICD-10-CM

## 2012-12-26 DIAGNOSIS — J069 Acute upper respiratory infection, unspecified: Secondary | ICD-10-CM

## 2012-12-26 DIAGNOSIS — M79609 Pain in unspecified limb: Secondary | ICD-10-CM

## 2012-12-26 DIAGNOSIS — J45901 Unspecified asthma with (acute) exacerbation: Secondary | ICD-10-CM

## 2012-12-26 DIAGNOSIS — A5400 Gonococcal infection of lower genitourinary tract, unspecified: Secondary | ICD-10-CM

## 2012-12-26 DIAGNOSIS — G4733 Obstructive sleep apnea (adult) (pediatric): Secondary | ICD-10-CM

## 2012-12-26 DIAGNOSIS — R21 Rash and other nonspecific skin eruption: Secondary | ICD-10-CM

## 2012-12-26 DIAGNOSIS — J111 Influenza due to unidentified influenza virus with other respiratory manifestations: Secondary | ICD-10-CM

## 2012-12-26 DIAGNOSIS — N946 Dysmenorrhea, unspecified: Secondary | ICD-10-CM

## 2012-12-26 DIAGNOSIS — L259 Unspecified contact dermatitis, unspecified cause: Secondary | ICD-10-CM

## 2012-12-26 DIAGNOSIS — Z6841 Body Mass Index (BMI) 40.0 and over, adult: Secondary | ICD-10-CM

## 2012-12-26 DIAGNOSIS — D259 Leiomyoma of uterus, unspecified: Secondary | ICD-10-CM

## 2012-12-26 DIAGNOSIS — J309 Allergic rhinitis, unspecified: Secondary | ICD-10-CM

## 2012-12-26 DIAGNOSIS — L03116 Cellulitis of left lower limb: Secondary | ICD-10-CM

## 2012-12-26 DIAGNOSIS — B353 Tinea pedis: Secondary | ICD-10-CM

## 2012-12-26 LAB — CBC WITH DIFFERENTIAL/PLATELET
Basophils Absolute: 0 K/uL (ref 0.0–0.1)
Basophils Relative: 0 % (ref 0–1)
Eosinophils Absolute: 0 K/uL (ref 0.0–0.7)
Eosinophils Relative: 0 % (ref 0–5)
HCT: 39.7 % (ref 36.0–46.0)
Hemoglobin: 13.1 g/dL (ref 12.0–15.0)
Lymphocytes Relative: 7 % — ABNORMAL LOW (ref 12–46)
Lymphs Abs: 1 K/uL (ref 0.7–4.0)
MCH: 29.8 pg (ref 26.0–34.0)
MCHC: 33 g/dL (ref 30.0–36.0)
MCV: 90.2 fL (ref 78.0–100.0)
Monocytes Absolute: 1 K/uL (ref 0.1–1.0)
Monocytes Relative: 7 % (ref 3–12)
Neutro Abs: 12.6 K/uL — ABNORMAL HIGH (ref 1.7–7.7)
Neutrophils Relative %: 86 % — ABNORMAL HIGH (ref 43–77)
Platelets: 197 K/uL (ref 150–400)
RBC: 4.4 MIL/uL (ref 3.87–5.11)
RDW: 13.9 % (ref 11.5–15.5)
WBC: 14.7 K/uL — ABNORMAL HIGH (ref 4.0–10.5)

## 2012-12-26 LAB — URINALYSIS, ROUTINE W REFLEX MICROSCOPIC
Bilirubin Urine: NEGATIVE
Glucose, UA: NEGATIVE mg/dL
Hgb urine dipstick: NEGATIVE
Ketones, ur: NEGATIVE mg/dL
Nitrite: NEGATIVE
Protein, ur: NEGATIVE mg/dL
Specific Gravity, Urine: 1.02 (ref 1.005–1.030)
Urobilinogen, UA: 0.2 mg/dL (ref 0.0–1.0)
pH: 6 (ref 5.0–8.0)

## 2012-12-26 LAB — COMPREHENSIVE METABOLIC PANEL
AST: 14 U/L (ref 0–37)
Albumin: 3.3 g/dL — ABNORMAL LOW (ref 3.5–5.2)
BUN: 17 mg/dL (ref 6–23)
Calcium: 9.3 mg/dL (ref 8.4–10.5)
Creatinine, Ser: 0.95 mg/dL (ref 0.50–1.10)
GFR calc Af Amer: >90 mL/min (ref >90)
Total Protein: 7.7 g/dL (ref 6.0–8.3)

## 2012-12-26 LAB — URINE MICROSCOPIC-ADD ON

## 2012-12-26 LAB — PREGNANCY, URINE: Preg Test, Ur: NEGATIVE

## 2012-12-26 MED ORDER — ACETAMINOPHEN 325 MG PO TABS
650.0000 mg | ORAL_TABLET | Freq: Once | ORAL | Status: AC
  Administered 2012-12-26: 650 mg via ORAL
  Filled 2012-12-26: qty 2

## 2012-12-26 MED ORDER — HYDROMORPHONE HCL PF 1 MG/ML IJ SOLN
1.0000 mg | Freq: Once | INTRAMUSCULAR | Status: AC
  Administered 2012-12-27: 1 mg via INTRAVENOUS
  Filled 2012-12-26: qty 1

## 2012-12-26 MED ORDER — ONDANSETRON HCL 4 MG/2ML IJ SOLN
4.0000 mg | Freq: Once | INTRAMUSCULAR | Status: AC
  Administered 2012-12-27: 4 mg via INTRAVENOUS
  Filled 2012-12-26: qty 2

## 2012-12-26 NOTE — ED Notes (Signed)
The pt is hyperventilating and has had chest pain since 1900

## 2012-12-26 NOTE — ED Provider Notes (Signed)
CSN: [id number]     Arrival date & time 12/26/12  2041 History     First MD Initiated Contact with Patient 12/26/12 2204     Chief Complaint  Patient presents with  . Leg Pain   (Consider location/radiation/quality/duration/timing/severity/associated sxs/prior Treatment) HPI  Marissa Joseph is a 32 y.o.female with a significant PMH of LE cellulitis, asthma, hypertension, obesity  presents to the ER with complaints of left lower extremity pain. 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 The pain is severe, she has a history of this happening before and failed outpatient treatment and required admission with IV antibiotics. She is unable to bear weight to the leg and can not tolerate it being touched. H/O chronic venous stasis.   Past Medical History  Diagnosis Date  . Asthma   . Fibroids   . Hypertension   . Obesity    History reviewed. No pertinent past surgical history. Family History  Problem Relation Age of Onset  . Diabetes Father    History  Substance Use Topics  . Smoking status: Current Every Day Smoker    Types: Cigarettes  . Smokeless tobacco: Not on file  . Alcohol Use: Yes     Comment: occ   OB History   Grav Para Term Preterm Abortions TAB SAB Ect Mult Living                 Review of Systems ROS negative unless otherwise stated in the HPI  Allergies  Shellfish allergy and Avocado  Home Medications   Current Outpatient Rx  Name  Route  Sig  Dispense  Refill  . albuterol (PROVENTIL HFA;VENTOLIN HFA) 108 (90 BASE) MCG/ACT inhaler   Inhalation   Inhale 2 puffs into the lungs every 6 (six) hours as needed for wheezing or shortness of breath.         . Fluticasone-Salmeterol (ADVAIR DISKUS) 250-50 MCG/DOSE AEPB   Inhalation   Inhale 1 puff into the lungs 2 (two) times daily.   60 each   3   . ibuprofen (ADVIL,MOTRIN) 200 MG tablet   Oral   Take 200 mg by mouth every 6 (six) hours as needed for pain.        . montelukast (SINGULAIR) 10 MG tablet   Oral   Take 1 tablet (10 mg total) by mouth at bedtime.   30 tablet   1   . Multiple Vitamin (MULTIVITAMIN WITH MINERALS) TABS   Oral   Take 1 tablet by mouth daily.          BP 145/82  Pulse 103  Temp(Src) 99.1 F (37.3 C) (Oral)  Resp 18  SpO2 100%  LMP 12/06/2012 Physical Exam  Nursing note and vitals reviewed. Constitutional: She appears well-developed and well-nourished. No distress.  HENT:  Head: Normocephalic and atraumatic.  Eyes: Pupils are equal, round, and reactive to light.  Neck: Normal range of motion. Neck supple.  Cardiovascular: Normal rate and regular rhythm.   Pulmonary/Chest: Effort normal.  Abdominal: Soft.  Musculoskeletal:       Legs: Two areas of tenderness to leg. Decreased ROM due to pain. She has venous stasis of lower extremity. Pulses are difficult to palpate due to body habitus but feet are warm, moist.   Skin is indurated and painful to touch.  Neurological: She is alert.  Skin: Skin is warm and dry.    ED Course   Procedures (including critical care time)  Labs Reviewed  URINALYSIS, ROUTINE W REFLEX MICROSCOPIC - Abnormal; Notable for the following:    Color, Urine AMBER (*)    Leukocytes, UA TRACE (*)    All other components within normal limits  CBC WITH DIFFERENTIAL - Abnormal; Notable for the following:    WBC 14.7 (*)    Neutrophils Relative % 86 (*)    Neutro Abs 12.6 (*)    Lymphocytes Relative 7 (*)    All other components within normal limits  COMPREHENSIVE METABOLIC PANEL - Abnormal; Notable for the following:    Glucose, Bld 102 (*)    Albumin 3.3 (*)    GFR calc non Af Amer 79 (*)    All other components within normal limits  URINE MICROSCOPIC-ADD ON - Abnormal; Notable for the following:    Squamous Epithelial / LPF FEW (*)    All other components within normal limits  PREGNANCY, URINE   No results found. 1. Cellulitis of left lower extremity     MDM   Unable to get IV access, IV team called for IV abx due to patient being unable to ambulate from pain, decreased circulation and requiring admission for failing outpatient treatment multiple times before will admit.  Admit, inpatient, team 10, med-surg, Dr. Richardo Hanks, PA-C 12/27/12 1308

## 2012-12-26 NOTE — ED Notes (Signed)
The pt has been having pain in her entire lt leg today.  No known injury.  She was having pain after standing on it all day

## 2012-12-26 NOTE — ED Notes (Signed)
Multiple IV start attempts with no success.  IV team paged.

## 2012-12-26 NOTE — ED Notes (Signed)
IV team to come start IV. 

## 2012-12-26 NOTE — ED Notes (Signed)
The pt had a temp yesterday

## 2012-12-27 ENCOUNTER — Encounter (HOSPITAL_COMMUNITY): Payer: Self-pay | Admitting: Internal Medicine

## 2012-12-27 DIAGNOSIS — E669 Obesity, unspecified: Secondary | ICD-10-CM

## 2012-12-27 DIAGNOSIS — L02419 Cutaneous abscess of limb, unspecified: Principal | ICD-10-CM

## 2012-12-27 DIAGNOSIS — M79609 Pain in unspecified limb: Secondary | ICD-10-CM

## 2012-12-27 DIAGNOSIS — M7989 Other specified soft tissue disorders: Secondary | ICD-10-CM

## 2012-12-27 DIAGNOSIS — J45909 Unspecified asthma, uncomplicated: Secondary | ICD-10-CM

## 2012-12-27 LAB — COMPREHENSIVE METABOLIC PANEL
ALT: 9 U/L (ref 0–35)
AST: 11 U/L (ref 0–37)
Albumin: 2.9 g/dL — ABNORMAL LOW (ref 3.5–5.2)
Alkaline Phosphatase: 56 U/L (ref 39–117)
Chloride: 99 mEq/L (ref 96–112)
Potassium: 3.8 mEq/L (ref 3.5–5.1)
Total Bilirubin: 0.5 mg/dL (ref 0.3–1.2)

## 2012-12-27 LAB — CBC WITH DIFFERENTIAL/PLATELET
Eosinophils Absolute: 0 10*3/uL (ref 0.0–0.7)
Eosinophils Relative: 0 % (ref 0–5)
Lymphs Abs: 0.8 10*3/uL (ref 0.7–4.0)
MCH: 30.4 pg (ref 26.0–34.0)
MCV: 90.5 fL (ref 78.0–100.0)
Platelets: 185 10*3/uL (ref 150–400)
RDW: 13.9 % (ref 11.5–15.5)

## 2012-12-27 LAB — MRSA PCR SCREENING: MRSA by PCR: POSITIVE — AB

## 2012-12-27 MED ORDER — ONDANSETRON HCL 4 MG/2ML IJ SOLN: 4.0000 mg | Freq: Four times a day (QID) | INTRAMUSCULAR | Status: DC | PRN

## 2012-12-27 MED ORDER — ADULT MULTIVITAMIN W/MINERALS CH
1.0000 | ORAL_TABLET | Freq: Every day | ORAL | Status: DC
  Administered 2012-12-27 – 2012-12-31 (×5): 1 via ORAL
  Filled 2012-12-27 (×5): qty 1

## 2012-12-27 MED ORDER — HYDROMORPHONE HCL PF 1 MG/ML IJ SOLN: 1.0000 mg | Freq: Once | INTRAMUSCULAR | Status: AC

## 2012-12-27 MED ORDER — ENOXAPARIN SODIUM 80 MG/0.8ML ~~LOC~~ SOLN
80.0000 mg | Freq: Every day | SUBCUTANEOUS | Status: DC
  Administered 2012-12-27 – 2012-12-30 (×4): 80 mg via SUBCUTANEOUS
  Filled 2012-12-27 (×5): qty 0.8

## 2012-12-27 MED ORDER — VANCOMYCIN HCL 10 G IV SOLR: 1500.0000 mg | Freq: Two times a day (BID) | INTRAVENOUS | Status: DC

## 2012-12-27 MED ORDER — PIPERACILLIN-TAZOBACTAM 3.375 G IVPB
3.3750 g | Freq: Three times a day (TID) | INTRAVENOUS | Status: DC
  Administered 2012-12-27 – 2012-12-30 (×11): 3.375 g via INTRAVENOUS
  Filled 2012-12-27 (×15): qty 50

## 2012-12-27 MED ORDER — VANCOMYCIN HCL IN DEXTROSE 1-5 GM/200ML-% IV SOLN
1000.0000 mg | Freq: Once | INTRAVENOUS | Status: AC
  Filled 2012-12-27: qty 200

## 2012-12-27 MED ORDER — ALBUTEROL SULFATE (5 MG/ML) 0.5% IN NEBU
2.5000 mg | INHALATION_SOLUTION | Freq: Three times a day (TID) | RESPIRATORY_TRACT | Status: AC
  Administered 2012-12-27 – 2012-12-28 (×3): 2.5 mg via RESPIRATORY_TRACT
  Filled 2012-12-27 (×3): qty 0.5

## 2012-12-27 MED ORDER — MOMETASONE FURO-FORMOTEROL FUM 100-5 MCG/ACT IN AERO
2.0000 | INHALATION_SPRAY | Freq: Two times a day (BID) | RESPIRATORY_TRACT | Status: DC
  Administered 2012-12-28 (×2): 2 via RESPIRATORY_TRACT
  Filled 2012-12-27 (×2): qty 8.8

## 2012-12-27 MED ORDER — VANCOMYCIN HCL IN DEXTROSE 1-5 GM/200ML-% IV SOLN
1000.0000 mg | Freq: Once | INTRAVENOUS | Status: AC
  Administered 2012-12-27: 1000 mg via INTRAVENOUS
  Filled 2012-12-27: qty 200

## 2012-12-27 MED ORDER — HYDROMORPHONE HCL PF 1 MG/ML IJ SOLN
INTRAMUSCULAR | Status: AC
  Administered 2012-12-27: 1 mg via INTRAVENOUS
  Filled 2012-12-27: qty 1

## 2012-12-27 MED ORDER — ACETAMINOPHEN 325 MG PO TABS
650.0000 mg | ORAL_TABLET | Freq: Four times a day (QID) | ORAL | Status: DC | PRN
  Administered 2012-12-27: 650 mg via ORAL
  Filled 2012-12-27: qty 2

## 2012-12-27 MED ORDER — ACETAMINOPHEN 650 MG RE SUPP: 650.0000 mg | Freq: Four times a day (QID) | RECTAL | Status: DC | PRN

## 2012-12-27 MED ORDER — ALBUTEROL SULFATE (5 MG/ML) 0.5% IN NEBU
2.5000 mg | INHALATION_SOLUTION | RESPIRATORY_TRACT | Status: DC | PRN
  Administered 2012-12-30: 2.5 mg via RESPIRATORY_TRACT
  Filled 2012-12-27: qty 0.5

## 2012-12-27 MED ORDER — HYDROCODONE-ACETAMINOPHEN 5-325 MG PO TABS
1.0000 | ORAL_TABLET | ORAL | Status: DC | PRN
  Administered 2012-12-27 – 2012-12-31 (×11): 1 via ORAL
  Filled 2012-12-27 (×11): qty 1

## 2012-12-27 MED ORDER — SODIUM CHLORIDE 0.9 % IV SOLN: INTRAVENOUS | Status: AC

## 2012-12-27 MED ORDER — MONTELUKAST SODIUM 10 MG PO TABS
10.0000 mg | ORAL_TABLET | Freq: Every day | ORAL | Status: DC
  Administered 2012-12-27 – 2012-12-30 (×4): 10 mg via ORAL
  Filled 2012-12-27 (×6): qty 1

## 2012-12-27 MED ORDER — ONDANSETRON HCL 4 MG PO TABS: 4.0000 mg | ORAL_TABLET | Freq: Four times a day (QID) | ORAL | Status: DC | PRN

## 2012-12-27 MED ORDER — ALBUTEROL SULFATE HFA 108 (90 BASE) MCG/ACT IN AERS
2.0000 | INHALATION_SPRAY | Freq: Four times a day (QID) | RESPIRATORY_TRACT | Status: DC | PRN
  Administered 2012-12-27 – 2012-12-30 (×6): 2 via RESPIRATORY_TRACT
  Filled 2012-12-27: qty 6.7

## 2012-12-27 MED ORDER — SODIUM CHLORIDE 0.9 % IV SOLN
1500.0000 mg | Freq: Two times a day (BID) | INTRAVENOUS | Status: DC
  Administered 2012-12-27 – 2012-12-29 (×5): 1500 mg via INTRAVENOUS
  Filled 2012-12-27 (×6): qty 1500

## 2012-12-27 NOTE — Progress Notes (Signed)
Utilization review completed. Corazon Nickolas, RN, BSN. 

## 2012-12-27 NOTE — ED Notes (Signed)
Thayer Ohm, RN called and ok to transport pt in regular bed.

## 2012-12-27 NOTE — H&P (Signed)
[facility] History and Physical  Marissa Joseph ZOX:[id number] DOB: 1980/07/28 DOA: 12/26/2012  Referring physician: ER physician. PCP: Default, Provider, MD   Chief Complaint: Left leg pain with fever.  HPI: Marissa Joseph is a 32 y.o. female history of asthma presented to the ER because of left leg pain. 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 Patient came to the ER and was found to have tender left lower extremity below the knee which was tender on touching. Patient also had mild fever and has been admitted for IV antibiotics for cellulitis. Patient denies any insect bite or trauma. Patient otherwise denies any nausea vomiting abdominal pain chest pain or shortness of breath.  Review of Systems: As presented in the history of presenting illness, rest negative.  Past Medical History  Diagnosis Date  . Asthma   . Fibroids   . Hypertension   . Obesity    History reviewed. No pertinent past surgical history. Social History:  reports that she has been smoking Cigarettes.  She has been smoking about 0.00 packs per day. She does not have any smokeless tobacco history on file. She reports that  drinks alcohol. She reports that she does not use illicit drugs. Home. where does patient live-- Can do ADLs. Can patient participate in ADLs?  Allergies  Allergen Reactions  . Shellfish Allergy Hives, Shortness Of Breath and Swelling  . Avocado     Throat itching and swelling    Family History  Problem Relation Age of Onset  . Diabetes Father       Prior to Admission medications   Medication Sig Start Date End Date Taking? Authorizing Provider  albuterol (PROVENTIL HFA;VENTOLIN HFA) 108 (90 BASE) MCG/ACT inhaler Inhale 2 puffs into the lungs every 6 (six) hours as needed for wheezing or shortness of breath. 08/20/12  Yes Dorothea Ogle, MD  Fluticasone-Salmeterol (ADVAIR DISKUS) 250-50  MCG/DOSE AEPB Inhale 1 puff into the lungs 2 (two) times daily. 08/20/12  Yes Dorothea Ogle, MD  ibuprofen (ADVIL,MOTRIN) 200 MG tablet Take 200 mg by mouth every 6 (six) hours as needed for pain.   Yes Historical Provider, MD  montelukast (SINGULAIR) 10 MG tablet Take 1 tablet (10 mg total) by mouth at bedtime. 06/05/12  Yes Renae Fickle, MD  Multiple Vitamin (MULTIVITAMIN WITH MINERALS) TABS Take 1 tablet by mouth daily.   Yes Historical Provider, MD   Physical Exam: Filed Vitals:   12/26/12 2050 12/26/12 2150 12/26/12 2223  BP: 153/92 143/81 145/82  Pulse: 106 102 103  Temp: 100.2 F (37.9 C)  99.1 F (37.3 C)  TempSrc: Oral  Oral  Resp: 24  18  SpO2: 97% 100% 100%     General:  Well-developed well-nourished.  Eyes: Anicteric no pallor.  ENT: No discharge from ears eyes nose mouth.  Neck: No mass felt.  Cardiovascular: S1-S2 heard.  Respiratory: No rhonchi or crepitations.  Abdomen: Soft nontender bowel sounds present.  Skin: Mild erythema in the left lower extremity.  Musculoskeletal: Tenderness in the left lower extremity below the knee.  Psychiatric: Appears normal.  Neurologic: Alert awake oriented to time place and person. Moves all extremities.  Labs on Admission:  Basic Metabolic Panel:  Recent Labs Lab 12/26/12 2155  NA 135  K 4.2  CL 101  CO2 28  GLUCOSE 102*  BUN 17  CREATININE 0.95  CALCIUM 9.3   Liver Function Tests:  Recent Labs Lab 12/26/12 2155  AST 14  ALT 10  ALKPHOS 60  BILITOT 0.3  PROT 7.7  ALBUMIN 3.3*   No results found for this basename: LIPASE, AMYLASE,  in the last 168 hours No results found for this basename: AMMONIA,  in the last 168 hours CBC:  Recent Labs Lab 12/26/12 2155  WBC 14.7*  NEUTROABS 12.6*  HGB 13.1  HCT 39.7  MCV 90.2  PLT 197   Cardiac Enzymes: No results found for this basename: CKTOTAL, CKMB, CKMBINDEX, TROPONINI,  in the last 168 hours  BNP (last 3 results) No results found for this  basename: PROBNP,  in the last 8760 hours CBG: No results found for this basename: GLUCAP,  in the last 168 hours  Radiological Exams on Admission: No results found.   Assessment/Plan Principal Problem:   Cellulitis, leg Active Problems:   ASTHMA   1. Left lower extremity cellulitis - continue with IV vancomycin. Check Doppler to rule out DVT. 2. Bronchial asthma - presently not wheezing continue inhalers.     Code Status: Full code.  Family Communication: None.  Disposition Plan: Admit to inpatient.    Janesha Brissette N. [facility] Pager [phone].  If 7PM-7AM, please contact night-coverage [email or web address] Password [facility] 12/27/2012, 1:34 AM

## 2012-12-27 NOTE — Progress Notes (Signed)
Patient showing signs/symptoms of early sepsis. Fever of 100.3 with tachycardia sustained 120 - 130 on telemetry. BP was 130-150/80-90 range in ED, currently 118/69. WBC increased from 14K to 20K overnight regardless of coverage with IV vancomycin 2gm. Patient continues to have pain and feels "hot."  Paged NP on-call for [facility] to make aware.  Continuing to monitor.

## 2012-12-27 NOTE — ED Notes (Signed)
IP RN requesting that ED transport pt in bariatric bed instead of having bariatric bed delivered to 3W.  Advised RN that pt will be transported up after bed is received in the ED.

## 2012-12-27 NOTE — Progress Notes (Signed)
VASCULAR LAB PRELIMINARY  PRELIMINARY  PRELIMINARY  PRELIMINARY  Bilateral lower extremity venous duplex  completed.    Preliminary report:  Bilateral:  No obvious evidence of DVT, superficial thrombosis, or Baker's Cyst.  Technically limited by body habitus and edema.    Shayley Medlin, RVT 12/27/2012, 1:55 PM

## 2012-12-27 NOTE — Progress Notes (Addendum)
[facility] PROGRESS NOTE  Marissa Joseph EXB:[id number] DOB: May 30, 1981 DOA: 12/26/2012 PCP: Default, Provider, MD I have seen and examined pt who is a  31yo admitted this am by Dr Toniann Fail with L. Leg pain and fever found to have cellulitis. Pt still with increase pain, states was hospitalized for same last December. She is requesting prn nebs, but denies chest pain and no SOB at this time. Continue current management plan as per Dr Toniann Fail, noted wbc increased>>will obtain blood cultures, add zosyn and follow. Also prn nebs.     [name]  [facility] Pager [phone]. If 7PM-7AM, please contact night-coverage at [email or web address], password [facility] 12/27/2012, 12:29 PM  LOS: 1 day

## 2012-12-27 NOTE — Progress Notes (Addendum)
ANTIBIOTIC CONSULT NOTE - INITIAL  Pharmacy Consult for Vancomycin Indication: Cellulitis  Allergies  Allergen Reactions  . Shellfish Allergy Hives, Shortness Of Breath and Swelling  . Avocado     Throat itching and swelling   Patient Measurements: Weight:  172.6 kg (05/2012) Height:  163 cm (05/2012)  Vital Signs: Temp: 99.1 F (37.3 C) (07/24 2223) Temp src: Oral (07/24 2223) BP: 114/64 mmHg (07/25 0115) Pulse Rate: 125 (07/25 0115)  Labs:  Recent Labs  12/26/12 2155  WBC 14.7*  HGB 13.1  PLT 197  CREATININE 0.95   Microbiology: No results found for this or any previous visit (from the past 720 hour(s)).  Medical History: Past Medical History  Diagnosis Date  . Asthma   . Fibroids   . Hypertension   . Obesity    Medications:  Anti-infectives   Start     Dose/Rate Route Frequency Ordered Stop   12/27/12 0015  vancomycin (VANCOCIN) IVPB 1000 mg/200 mL premix     1,000 mg 200 mL/hr over 60 Minutes Intravenous  Once 12/27/12 0005       Assessment: 32 yo morbidly obese female who presents with c/o leg pain.  She has a PMH significant for LE cellulites which is chronic as well as chronic venous stasis.  We have been asked to monitor her IV antibiotics while she is hospitalized.  Her creatinine is normal at 0.95 with an estimated clearance > 50 ml/min.  She has some mild leukocytosis with WBC of 14.7K.  Goal of Therapy:  Vancomycin trough level 10-15 mcg/ml  Plan:  1.  She has received 1gm of IV Vancomycin, will repeat this now for a 2 gm load. 2.  Begin 1500mg  IV Vancomycin every 12 hours starting 12 hours after last dose of 1gm. 3.  Monitor renal function and adjust dose as needed. 4.  Check s/s levels if continued > 48-72 hours  Nadara Mustard, PharmD., MS Clinical Pharmacist Pager:  [phone] Thank you for allowing pharmacy to be part of this patients care team. 12/27/2012,1:54 AM  Addendum:  Pharmacy is now also consulted to start zosyn.    Plan: Zosyn 3.375 g IV Q 8 hrs (4 hr infusion) Continue plans above

## 2012-12-27 NOTE — Progress Notes (Signed)
Pt. O2 sat at 80%; O2 removed per pt. HR 130's. Asleep and no active s/s of distress. Replaced O2 at 2L/m via [place]. O2 sat returned to 94%. HR remains at 130.  Paged MD to make aware.  Continuing to monitor.

## 2012-12-28 DIAGNOSIS — R509 Fever, unspecified: Secondary | ICD-10-CM

## 2012-12-28 LAB — BASIC METABOLIC PANEL
BUN: 11 mg/dL (ref 6–23)
Calcium: 8.6 mg/dL (ref 8.4–10.5)
Creatinine, Ser: 1.06 mg/dL (ref 0.50–1.10)
GFR calc Af Amer: 80 mL/min — ABNORMAL LOW (ref >90)
GFR calc non Af Amer: 69 mL/min — ABNORMAL LOW (ref >90)
Potassium: 3.5 mEq/L (ref 3.5–5.1)

## 2012-12-28 LAB — CBC
HCT: 35.5 % — ABNORMAL LOW (ref 36.0–46.0)
MCH: 30.2 pg (ref 26.0–34.0)
MCHC: 33.2 g/dL (ref 30.0–36.0)
MCV: 90.8 fL (ref 78.0–100.0)
RDW: 14.1 % (ref 11.5–15.5)

## 2012-12-28 NOTE — Progress Notes (Addendum)
[facility] PROGRESS NOTE  Marissa Joseph ZOX:[id number] DOB: 01-Feb-1981 DOA: 12/26/2012 PCP: Default, Provider, MD  Assessment/Plan: 1.Left lower extremity cellulitis   -still febrile today but trending down, await blood cultures -leukocytois improving -continue zosyn started on 7/25 and IV vancomycin  -Doppler neg DVT. 2.Bronchial asthma - Stable,  not wheezing continue bronchodilators.    Code Status: full Family Communication:family at bedside Disposition Plan: to home when medically ready   Consultants:  none  Procedures:  Doppler US neg for DVT  Antibiotics:  vanc and zosyn  HPI/Subjective: Still with pain but decreasing, also less red and swelling beginning to decrease   Objective: Filed Vitals:   12/27/12 0534 12/27/12 1500 12/27/12 2144 12/28/12 0700  BP: 118/69 119/65 105/69   Pulse: 120 117 94   Temp: 100.3 F (37.9 C) 100.2 F (37.9 C) 99 F (37.2 C)   TempSrc: Oral  Oral   Resp: 18 20 20    Height:      Weight:      SpO2: 96% 94% 97% 98%   No intake or output data in the 24 hours ending 12/28/12 1249 Filed Weights   12/27/12 0233  Weight: 174.363 kg (384 lb 6.4 oz)   Exam:  General: alert & oriented x 3, morbidly obese In NAD Cardiovascular: RRR, nl S1 s2 Respiratory: CTAB Abdomen: soft +BS NT/ND, no masses palpable Extremities: No cyanosis, LLE with decreasing erythema, edema and  tenderness    Data Reviewed: Basic Metabolic Panel:  Recent Labs Lab 12/26/12 2155 12/27/12 0518 12/28/12 0515  NA 135 134* 133*  K 4.2 3.8 3.5  CL 101 99 100  CO2 28 21 28   GLUCOSE 102* 120* 136*  BUN 17 17 11   CREATININE 0.95 1.07 1.06  CALCIUM 9.3 8.7 8.6   Liver Function Tests:  Recent Labs Lab 12/26/12 2155 12/27/12 0518  AST 14 11  ALT 10 9  ALKPHOS 60 56  BILITOT 0.3 0.5  PROT 7.7 6.8  ALBUMIN 3.3* 2.9*   No results found for this basename: LIPASE, AMYLASE,  in the last 168 hours No results found for this basename:  AMMONIA,  in the last 168 hours CBC:  Recent Labs Lab 12/26/12 2155 12/27/12 0518 12/28/12 0515  WBC 14.7* 20.4* 12.3*  NEUTROABS 12.6* 19.1*  --   HGB 13.1 12.5 11.8*  HCT 39.7 37.2 35.5*  MCV 90.2 90.5 90.8  PLT 197 185 169   Cardiac Enzymes: No results found for this basename: CKTOTAL, CKMB, CKMBINDEX, TROPONINI,  in the last 168 hours BNP (last 3 results) No results found for this basename: PROBNP,  in the last 8760 hours CBG: No results found for this basename: GLUCAP,  in the last 168 hours  Recent Results (from the past 240 hour(s))  MRSA PCR SCREENING     Status: Abnormal   Collection Time    12/27/12  2:38 AM      Result Value Range Status   MRSA by PCR POSITIVE (*) NEGATIVE Final   Comment:            The GeneXpert MRSA Assay (FDA     approved for NASAL specimens     only), is one component of a     comprehensive MRSA colonization     surveillance program. It is not     intended to diagnose MRSA     infection nor to guide or     monitor treatment for     MRSA infections.  RESULT CALLED TO, READ BACK BY AND VERIFIED WITH:     R. BUENEIA,RN 12/27/12 0905 BY K SCHULTZ     Studies: No results found.  Scheduled Meds: . albuterol  2.5 mg Nebulization Q8H  . enoxaparin (LOVENOX) injection  80 mg Subcutaneous Daily  . mometasone-formoterol  2 puff Inhalation BID  . montelukast  10 mg Oral QHS  . multivitamin with minerals  1 tablet Oral Daily  . piperacillin-tazobactam (ZOSYN)  IV  3.375 g Intravenous Q8H  . vancomycin  1,500 mg Intravenous Q12H   Continuous Infusions:   Principal Problem:   Cellulitis, leg Active Problems:   ASTHMA    Time spent: 35    [facility]  [facility] Pager [phone]. If 7PM-7AM, please contact night-coverage at [email or web address], password [facility] 12/28/2012, 12:49 PM  LOS: 2 days

## 2012-12-28 NOTE — ED Provider Notes (Signed)
Medical screening examination/treatment/procedure(s) were performed by non-physician practitioner and as supervising physician I was immediately available for consultation/collaboration.   Charles B. Sheldon, MD 12/28/12 0709 

## 2012-12-29 DIAGNOSIS — M79609 Pain in unspecified limb: Secondary | ICD-10-CM

## 2012-12-29 LAB — BASIC METABOLIC PANEL
Calcium: 8.8 mg/dL (ref 8.4–10.5)
GFR calc Af Amer: 86 mL/min — ABNORMAL LOW (ref >90)
GFR calc non Af Amer: 74 mL/min — ABNORMAL LOW (ref >90)
Potassium: 3.8 mEq/L (ref 3.5–5.1)
Sodium: 136 mEq/L (ref 135–145)

## 2012-12-29 LAB — CBC
MCHC: 31.2 g/dL (ref 30.0–36.0)
Platelets: 148 10*3/uL — ABNORMAL LOW (ref 150–400)
RDW: 14.2 % (ref 11.5–15.5)

## 2012-12-29 MED ORDER — MUPIROCIN 2 % EX OINT
1.0000 "application " | TOPICAL_OINTMENT | Freq: Two times a day (BID) | CUTANEOUS | Status: DC
  Administered 2012-12-29 – 2012-12-31 (×4): 1 via NASAL
  Filled 2012-12-29: qty 22

## 2012-12-29 MED ORDER — VANCOMYCIN HCL 10 G IV SOLR
1750.0000 mg | Freq: Two times a day (BID) | INTRAVENOUS | Status: DC
  Administered 2012-12-30 (×2): 1750 mg via INTRAVENOUS
  Filled 2012-12-29 (×3): qty 1750

## 2012-12-29 MED ORDER — CHLORHEXIDINE GLUCONATE CLOTH 2 % EX PADS
6.0000 | MEDICATED_PAD | Freq: Every day | CUTANEOUS | Status: DC
  Administered 2012-12-30 – 2012-12-31 (×2): 6 via TOPICAL

## 2012-12-29 MED ORDER — FUROSEMIDE 10 MG/ML IJ SOLN
40.0000 mg | Freq: Once | INTRAMUSCULAR | Status: AC
  Administered 2012-12-29: 40 mg via INTRAVENOUS
  Filled 2012-12-29: qty 4

## 2012-12-29 NOTE — Progress Notes (Addendum)
[facility] PROGRESS NOTE  LYNSI DOONER JXB:[id number] DOB: [birth year] DOA: 12/26/2012 PCP: Default, Provider, MD  Assessment/Plan: 1.Left lower extremity cellulitis   -still febrile today but trending down, await blood cultures -leukocytosis resolved -continue zosyn vancomycin  -continue pain management. Heplock fluids prn dose of lasix -Doppler neg DVT. 2.Bronchial asthma - Stable,  not wheezing continue bronchodilators.    Code Status: full Family Communication:family at bedside Disposition Plan: to home when medically ready   Consultants:  none  Procedures:  Doppler US neg for DVT  Antibiotics:  Vanc7/24>>   Zosyn 7/25  HPI/Subjective: LLE pain decreasing, also less redness and swelling. Still difficulty walking  Objective: Filed Vitals:   12/28/12 1600 12/28/12 2058 12/29/12 0605 12/29/12 1035  BP:  143/96 105/68   Pulse:  101 96   Temp:  100.3 F (37.9 C) 98.7 F (37.1 C)   TempSrc:  Oral Oral   Resp:  18 18   Height:      Weight:      SpO2: 98% 98% 98% 95%    Intake/Output Summary (Last 24 hours) at 12/29/12 1249 Last data filed at 12/29/12 1155  Gross per 24 hour  Intake    840 ml  Output   1250 ml  Net   -410 ml   Filed Weights   12/27/12 0233  Weight: 174.363 kg (384 lb 6.4 oz)   Exam:  General: alert & oriented x 3, morbidly obese In NAD Cardiovascular: RRR, nl S1 s2 Respiratory: CTAB Abdomen: soft +BS NT/ND, no masses palpable Extremities: No cyanosis, LLE with decreasing erythema, edema and  tenderness    Data Reviewed: Basic Metabolic Panel:  Recent Labs Lab 12/26/12 2155 12/27/12 0518 12/28/12 0515 12/29/12 0720  NA 135 134* 133* 136  K 4.2 3.8 3.5 3.8  CL 101 99 100 103  CO2 28 21 28 26   GLUCOSE 102* 120* 136* 128*  BUN 17 17 11 7   CREATININE 0.95 1.07 1.06 1.00  CALCIUM 9.3 8.7 8.6 8.8   Liver Function Tests:  Recent Labs Lab 12/26/12 2155 12/27/12 0518  AST 14 11  ALT 10 9  ALKPHOS 60 56   BILITOT 0.3 0.5  PROT 7.7 6.8  ALBUMIN 3.3* 2.9*   No results found for this basename: LIPASE, AMYLASE,  in the last 168 hours No results found for this basename: AMMONIA,  in the last 168 hours CBC:  Recent Labs Lab 12/26/12 2155 12/27/12 0518 12/28/12 0515 12/29/12 0720  WBC 14.7* 20.4* 12.3* 9.4  NEUTROABS 12.6* 19.1*  --   --   HGB 13.1 12.5 11.8* 10.9*  HCT 39.7 37.2 35.5* 34.9*  MCV 90.2 90.5 90.8 91.8  PLT 197 185 169 148*   Cardiac Enzymes: No results found for this basename: CKTOTAL, CKMB, CKMBINDEX, TROPONINI,  in the last 168 hours BNP (last 3 results) No results found for this basename: PROBNP,  in the last 8760 hours CBG: No results found for this basename: GLUCAP,  in the last 168 hours  Recent Results (from the past 240 hour(s))  MRSA PCR SCREENING     Status: Abnormal   Collection Time    12/27/12  2:38 AM      Result Value Range Status   MRSA by PCR POSITIVE (*) NEGATIVE Final   Comment:            The GeneXpert MRSA Assay (FDA     approved for NASAL specimens     only), is one component of a  comprehensive MRSA colonization     surveillance program. It is not     intended to diagnose MRSA     infection nor to guide or     monitor treatment for     MRSA infections.     RESULT CALLED TO, READ BACK BY AND VERIFIED WITH:     R. BUENEIA,RN 12/27/12 0905 BY K SCHULTZ     Studies: No results found.  Scheduled Meds: . enoxaparin (LOVENOX) injection  80 mg Subcutaneous Daily  . montelukast  10 mg Oral QHS  . multivitamin with minerals  1 tablet Oral Daily  . piperacillin-tazobactam (ZOSYN)  IV  3.375 g Intravenous Q8H  . vancomycin  1,500 mg Intravenous Q12H   Continuous Infusions:   Principal Problem:   Cellulitis, leg Active Problems:   ASTHMA    Time spent: 25    [facility] C  [facility] Pager [phone]. If 7PM-7AM, please contact night-coverage at [email or web address], password [facility] 12/29/2012, 12:49 PM  LOS: 3 days

## 2012-12-29 NOTE — Progress Notes (Signed)
ANTIBIOTIC CONSULT NOTE - FOLLOW UP  Pharmacy Consult for Vancomycin/Zosyn  Indication: Cellulitis  Allergies  Allergen Reactions  . Shellfish Allergy Hives, Shortness Of Breath and Swelling  . Avocado     Throat itching and swelling    Patient Measurements: Height: 5\' 4"  (162.6 cm) Weight: 384 lb 6.4 oz (174.363 kg) IBW/kg (Calculated) : 54.7  Vital Signs: Temp: 99.3 F (37.4 C) (07/27 1325) Temp src: Oral (07/27 1325) BP: 123/83 mmHg (07/27 1325) Pulse Rate: 98 (07/27 1325) Intake/Output from previous day: 07/26 0701 - 07/27 0700 In: 480 [P.O.:480] Out: -  Intake/Output from this shift: Total I/O In: 720 [P.O.:720] Out: 1250 [Urine:1250]  Labs:  Recent Labs  12/27/12 0518 12/28/12 0515 12/29/12 0720  WBC 20.4* 12.3* 9.4  HGB 12.5 11.8* 10.9*  PLT 185 169 148*  CREATININE 1.07 1.06 1.00   Estimated Creatinine Clearance: 132 ml/min (by C-G formula based on Cr of 1).  Recent Labs  12/29/12 1646  VANCOTROUGH 7.1*     Microbiology: Recent Results (from the past 720 hour(s))  MRSA PCR SCREENING     Status: Abnormal   Collection Time    12/27/12  2:38 AM      Result Value Range Status   MRSA by PCR POSITIVE (*) NEGATIVE Final   Comment:            The GeneXpert MRSA Assay (FDA     approved for NASAL specimens     only), is one component of a     comprehensive MRSA colonization     surveillance program. It is not     intended to diagnose MRSA     infection nor to guide or     monitor treatment for     MRSA infections.     RESULT CALLED TO, READ BACK BY AND VERIFIED WITH:     R. BUENEIA,RN 12/27/12 0905 BY K SCHULTZ  CULTURE, BLOOD (ROUTINE X 2)     Status: None   Collection Time    12/27/12  2:26 PM      Result Value Range Status   Specimen Description BLOOD ARM RIGHT   Final   Special Requests BOTTLES DRAWN AEROBIC AND ANAEROBIC 10CC   Final   Culture  Setup Time 12/28/2012 00:42   Final   Culture     Final   Value:        BLOOD CULTURE  RECEIVED NO GROWTH TO DATE CULTURE WILL BE HELD FOR 5 DAYS BEFORE ISSUING A FINAL NEGATIVE REPORT   Report Status PENDING   Incomplete  CULTURE, BLOOD (ROUTINE X 2)     Status: None   Collection Time    12/27/12  2:36 PM      Result Value Range Status   Specimen Description BLOOD HAND RIGHT   Final   Special Requests BOTTLES DRAWN AEROBIC ONLY 2.5CC   Final   Culture  Setup Time 12/28/2012 00:40   Final   Culture     Final   Value:        BLOOD CULTURE RECEIVED NO GROWTH TO DATE CULTURE WILL BE HELD FOR 5 DAYS BEFORE ISSUING A FINAL NEGATIVE REPORT   Report Status PENDING   Incomplete    Anti-infectives   Start     Dose/Rate Route Frequency Ordered Stop   12/27/12 1700  vancomycin (VANCOCIN) 1,500 mg in sodium chloride 0.9 % 500 mL IVPB     1,500 mg 250 mL/hr over 120 Minutes Intravenous Every 12 hours 12/27/12  0327     12/27/12 1500  vancomycin (VANCOCIN) 1,500 mg in sodium chloride 0.9 % 500 mL IVPB  Status:  Discontinued     1,500 mg 250 mL/hr over 120 Minutes Intravenous Every 12 hours 12/27/12 0203 12/27/12 0327   12/27/12 1400  piperacillin-tazobactam (ZOSYN) IVPB 3.375 g     3.375 g 12.5 mL/hr over 240 Minutes Intravenous 3 times per day 12/27/12 1254     12/27/12 0330  vancomycin (VANCOCIN) IVPB 1000 mg/200 mL premix     1,000 mg 200 mL/hr over 60 Minutes Intravenous  Once 12/27/12 0327 12/27/12 0451   12/27/12 0215  vancomycin (VANCOCIN) IVPB 1000 mg/200 mL premix     1,000 mg 200 mL/hr over 60 Minutes Intravenous  Once 12/27/12 0202 12/27/12 0452   12/27/12 0015  vancomycin (VANCOCIN) IVPB 1000 mg/200 mL premix     1,000 mg 200 mL/hr over 60 Minutes Intravenous  Once 12/27/12 0005 12/27/12 0214      Assessment: 32 y/o F on vancomycin/zosyn for LLE cellulitis. WBC 9.4<12.3. Renal function stable with Scr 1.00. Tmax 100.7. Vancomycin trough this evening is 7.1.   Goal of Therapy:  Vancomycin trough level 10-15 mcg/ml  Plan:  -Increase vancomycin to 1750 mg IV  q12h -Continue Zosyn 3.375G IV q8h to be infused over 4 hours -Trend WBC, temp, renal function -Vancomycin trough if therapy is expected to continue for >48 hours from today -Possible change to PO soon  Thank you for allowing me to take part in this patient's care,  Abran Duke, PharmD Clinical Pharmacist Phone: [phone] Pager: [phone] 12/29/2012 6:16 PM

## 2012-12-30 LAB — BASIC METABOLIC PANEL
Calcium: 8.9 mg/dL (ref 8.4–10.5)
GFR calc Af Amer: 84 mL/min — ABNORMAL LOW (ref >90)
GFR calc non Af Amer: 72 mL/min — ABNORMAL LOW (ref >90)
Glucose, Bld: 112 mg/dL — ABNORMAL HIGH (ref 70–99)
Sodium: 138 mEq/L (ref 135–145)

## 2012-12-30 MED ORDER — BUDESONIDE-FORMOTEROL FUMARATE 160-4.5 MCG/ACT IN AERO
2.0000 | INHALATION_SPRAY | Freq: Two times a day (BID) | RESPIRATORY_TRACT | Status: DC
  Filled 2012-12-30: qty 6

## 2012-12-30 MED ORDER — BUDESONIDE-FORMOTEROL FUMARATE 160-4.5 MCG/ACT IN AERO
2.0000 | INHALATION_SPRAY | Freq: Two times a day (BID) | RESPIRATORY_TRACT | Status: DC
  Administered 2012-12-30 – 2012-12-31 (×2): 2 via RESPIRATORY_TRACT
  Filled 2012-12-30: qty 6

## 2012-12-30 NOTE — Care Management Note (Addendum)
    Page 1 of 1   12/31/2012     10:17:49 AM   CARE MANAGEMENT NOTE 12/31/2012  Patient:  Marissa Joseph, Marissa Joseph   Account Number:  [id number]  Date Initiated:  12/30/2012  Documentation initiated by:  GRAVES-BIGELOW,Bharath Bernstein  Subjective/Objective Assessment:   Pt admitted with cellulitis. Iv ABX. Pt is from home with her mother. Pt states she is not working at this time. Was previously working via a [employer].     Action/Plan:   CM did provide pt with information to the [facility] and [facility]. CM will see if needs to utilize the [facility] Program for pt if she is to go home on singulair and albuterol. May be able to access a coupon on line.   Anticipated DC Date:  12/30/2012   Anticipated DC Plan:  HOME/SELF CARE  In-house referral  PCP / Insurance account manager      DC Planning Services  [facility]  [facility]  Medication Assistance      Choice offered to / List presented to:             Status of service:  Completed, signed off Medicare Important Message given?   (If response is "NO", the following Medicare IM given date fields will be blank) Date Medicare IM given:   Date Additional Medicare IM given:    Discharge Disposition:  HOME/SELF CARE  Per UR Regulation:  Reviewed for med. necessity/level of care/duration of stay  If discussed at Long Length of Stay Meetings, dates discussed:    Comments:  12-31-12 [address] [place] [phone] CM did have to utilize the [facility] program. Pt is aware that the cost will be 3.00 for each Rx. CM did call to verify price on medicaitons: singulair 34.80 and doxy 37.78. CM did make pt aware of the pt assistance forms on line from the company and rx discount cards online. CM did provide pt with a Rx discount card. NO furtherneds from CM at this time.

## 2012-12-31 MED ORDER — DOXYCYCLINE HYCLATE 100 MG PO TABS
100.0000 mg | ORAL_TABLET | Freq: Two times a day (BID) | ORAL | Status: DC
  Administered 2012-12-31: 100 mg via ORAL
  Filled 2012-12-31 (×2): qty 1

## 2012-12-31 MED ORDER — HYDROCODONE-ACETAMINOPHEN 5-325 MG PO TABS: 1.0000 | ORAL_TABLET | ORAL | Status: DC | PRN

## 2012-12-31 MED ORDER — DOXYCYCLINE HYCLATE 100 MG PO TABS: 100.0000 mg | ORAL_TABLET | Freq: Two times a day (BID) | ORAL | Status: DC

## 2012-12-31 MED ORDER — FLUTICASONE-SALMETEROL 250-50 MCG/DOSE IN AEPB: 1.0000 | INHALATION_SPRAY | Freq: Two times a day (BID) | RESPIRATORY_TRACT | Status: DC

## 2012-12-31 MED ORDER — MONTELUKAST SODIUM 10 MG PO TABS: 10.0000 mg | ORAL_TABLET | Freq: Every day | ORAL | Status: DC

## 2012-12-31 NOTE — Discharge Summary (Signed)
Physician Discharge Summary  MARYLN EASTHAM NGE:[id number] DOB: 04-Oct-1980 DOA: 12/26/2012  PCP: Default, Provider, MD  Admit date: 12/26/2012 Discharge date: 12/31/2012  Time spent: >32minutes  Recommendations for Outpatient Follow-up:      Follow-up Information   Please follow up. (PCP as directed, call for appt uppon discharge)      Discharge Diagnoses:  Principal Problem:   Cellulitis, leg Active Problems:   ASTHMA   Discharge Condition: improved/stable  Diet recommendation: regular  Filed Weights   12/27/12 0233  Weight: 174.363 kg (384 lb 6.4 oz)    History of present illness:  Marissa Joseph is a 32 y.o. female history of asthma presented to the ER because of left leg pain. 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 Patient came to the ER and was found to have tender left lower extremity below the knee which was tender on touching. Patient also had mild fever and has been admitted for IV antibiotics for cellulitis. Patient denies any insect bite or trauma. Patient otherwise denies any nausea vomiting abdominal pain chest pain or shortness of breath.  Hospital Course:  1.Left lower extremity cellulitis  -PT was febrile initially and was placed on broadspectrum abx, and  Subsequently defervesced, blood cultures>> no growth to date  -leukocytosis resolved  -as she improved, abx were changed to oral  -she clinically much improved and is to follow up outpt -Doppler neg DVT.  2.Bronchial asthma - c/o increased cough, symbicort was added and continue prn bronchodilators.she has been changed back to her advair and prn brochodilators which she is to continue upon dc.    Procedures:  none  Consultations:  none  Discharge Exam: Filed Vitals:   12/30/12 2015 12/30/12 2100 12/30/12 2306 12/31/12 0533  BP:  118/79  114/78  Pulse:  113 110 107  Temp:  99.3 F (37.4 C)  98.9  F (37.2 C)  TempSrc:  Oral  Oral  Resp:  22 20 18   Height:      Weight:      SpO2: 94% 95% 95% 94%   Exam:  General: alert & oriented x 3, morbidly obese In NAD  Cardiovascular: RRR, nl S1 s2  Respiratory: CTAB  Abdomen: soft +BS NT/ND, no masses palpable  Extremities: No cyanosis, LLE with much decreased erythema, edema and tenderness     Discharge Instructions  Discharge Orders   Future Orders Complete By Expires     Diet - low sodium heart healthy  As directed     Increase activity slowly  As directed         Medication List         albuterol 108 (90 BASE) MCG/ACT inhaler  Commonly known as:  PROVENTIL HFA;VENTOLIN HFA  Inhale 2 puffs into the lungs every 6 (six) hours as needed for wheezing or shortness of breath.     doxycycline 100 MG tablet  Commonly known as:  VIBRA-TABS  Take 1 tablet (100 mg total) by mouth every 12 (twelve) hours.     Fluticasone-Salmeterol 250-50 MCG/DOSE Aepb  Commonly known as:  ADVAIR DISKUS  Inhale 1 puff into the lungs 2 (two) times daily.     HYDROcodone-acetaminophen 5-325 MG per tablet  Commonly known as:  NORCO/VICODIN  Take 1 tablet by mouth every 4 (four) hours as needed.     ibuprofen 200 MG tablet  Commonly known as:  ADVIL,MOTRIN  Take 200 mg  by mouth every 6 (six) hours as needed for pain.     montelukast 10 MG tablet  Commonly known as:  SINGULAIR  Take 1 tablet (10 mg total) by mouth at bedtime.     multivitamin with minerals Tabs  Take 1 tablet by mouth daily.       Allergies  Allergen Reactions  . Shellfish Allergy Hives, Shortness Of Breath and Swelling  . Avocado     Throat itching and swelling      The results of significant diagnostics from this hospitalization (including imaging, microbiology, ancillary and laboratory) are listed below for reference.    Significant Diagnostic Studies: No results found.  Microbiology: Recent Results (from the past 240 hour(s))  MRSA PCR SCREENING      Status: Abnormal   Collection Time    12/27/12  2:38 AM      Result Value Range Status   MRSA by PCR POSITIVE (*) NEGATIVE Final   Comment:            The GeneXpert MRSA Assay (FDA     approved for NASAL specimens     only), is one component of a     comprehensive MRSA colonization     surveillance program. It is not     intended to diagnose MRSA     infection nor to guide or     monitor treatment for     MRSA infections.     RESULT CALLED TO, READ BACK BY AND VERIFIED WITH:     R. BUENEIA,RN 12/27/12 0905 BY K SCHULTZ  CULTURE, BLOOD (ROUTINE X 2)     Status: None   Collection Time    12/27/12  2:26 PM      Result Value Range Status   Specimen Description BLOOD ARM RIGHT   Final   Special Requests BOTTLES DRAWN AEROBIC AND ANAEROBIC 10CC   Final   Culture  Setup Time 12/28/2012 00:42   Final   Culture     Final   Value:        BLOOD CULTURE RECEIVED NO GROWTH TO DATE CULTURE WILL BE HELD FOR 5 DAYS BEFORE ISSUING A FINAL NEGATIVE REPORT   Report Status PENDING   Incomplete  CULTURE, BLOOD (ROUTINE X 2)     Status: None   Collection Time    12/27/12  2:36 PM      Result Value Range Status   Specimen Description BLOOD HAND RIGHT   Final   Special Requests BOTTLES DRAWN AEROBIC ONLY 2.5CC   Final   Culture  Setup Time 12/28/2012 00:40   Final   Culture     Final   Value:        BLOOD CULTURE RECEIVED NO GROWTH TO DATE CULTURE WILL BE HELD FOR 5 DAYS BEFORE ISSUING A FINAL NEGATIVE REPORT   Report Status PENDING   Incomplete     Labs: Basic Metabolic Panel:  Recent Labs Lab 12/26/12 2155 12/27/12 0518 12/28/12 0515 12/29/12 0720 12/30/12 0620  NA 135 134* 133* 136 138  K 4.2 3.8 3.5 3.8 4.0  CL 101 99 100 103 103  CO2 28 21 28 26 29   GLUCOSE 102* 120* 136* 128* 112*  BUN 17 17 11 7 7   CREATININE 0.95 1.07 1.06 1.00 1.02  CALCIUM 9.3 8.7 8.6 8.8 8.9   Liver Function Tests:  Recent Labs Lab 12/26/12 2155 12/27/12 0518  AST 14 11  ALT 10 9  ALKPHOS 60 56   BILITOT 0.3  0.5  PROT 7.7 6.8  ALBUMIN 3.3* 2.9*   No results found for this basename: LIPASE, AMYLASE,  in the last 168 hours No results found for this basename: AMMONIA,  in the last 168 hours CBC:  Recent Labs Lab 12/26/12 2155 12/27/12 0518 12/28/12 0515 12/29/12 0720  WBC 14.7* 20.4* 12.3* 9.4  NEUTROABS 12.6* 19.1*  --   --   HGB 13.1 12.5 11.8* 10.9*  HCT 39.7 37.2 35.5* 34.9*  MCV 90.2 90.5 90.8 91.8  PLT 197 185 169 148*   Cardiac Enzymes: No results found for this basename: CKTOTAL, CKMB, CKMBINDEX, TROPONINI,  in the last 168 hours BNP: BNP (last 3 results) No results found for this basename: PROBNP,  in the last 8760 hours CBG: No results found for this basename: GLUCAP,  in the last 168 hours     Signed:  Jeray Shugart C  [facility] 12/31/2012, 9:28 AM

## 2012-12-31 NOTE — Progress Notes (Addendum)
LATE ENTRY: PT SEEN AT 3:00PM [facility] PROGRESS NOTE  Marissa Joseph NWG:[id number] DOB: 05/07/1981 DOA: 12/26/2012 PCP: Default, Provider, MD  Assessment/Plan: 1.Left lower extremity cellulitis   -PT was febrile initially but dfervesced,  blood cultures>> no growth to date -leukocytosis resolved -change to oral abx in am -clinically much improved -Doppler neg DVT. 2.Bronchial asthma - c/o increased cough, add symbicort continue prn bronchodilators.    Code Status: full Family Communication:family at bedside Disposition Plan: to home in am   Consultants:  none  Procedures:  Doppler US neg for DVT  Antibiotics:  Vanc7/24>>   Zosyn 7/25  HPI/Subjective: LLE pain decreasing, also less redness and swelling. Still difficulty walking E ENTRY: PT SEEN AT 3:00PM Objective: Filed Vitals:   12/30/12 1416 12/30/12 2015 12/30/12 2100 12/30/12 2306  BP: 122/80  118/79   Pulse: 94  113 110  Temp: 98.7 F (37.1 C)  99.3 F (37.4 C)   TempSrc:   Oral   Resp: 18  22 20   Height:      Weight:      SpO2: 96% 94% 95% 95%    Intake/Output Summary (Last 24 hours) at 12/31/12 0007 Last data filed at 12/30/12 1900  Gross per 24 hour  Intake   1200 ml  Output    850 ml  Net    350 ml   Filed Weights   12/27/12 0233  Weight: 174.363 kg (384 lb 6.4 oz)   Exam:  General: alert & oriented x 3, morbidly obese In NAD Cardiovascular: RRR, nl S1 s2 Respiratory: CTAB Abdomen: soft +BS NT/ND, no masses palpable Extremities: No cyanosis, LLE with much decreased erythema, edema and  tenderness    Data Reviewed: Basic Metabolic Panel:  Recent Labs Lab 12/26/12 2155 12/27/12 0518 12/28/12 0515 12/29/12 0720 12/30/12 0620  NA 135 134* 133* 136 138  K 4.2 3.8 3.5 3.8 4.0  CL 101 99 100 103 103  CO2 28 21 28 26 29   GLUCOSE 102* 120* 136* 128* 112*  BUN 17 17 11 7 7   CREATININE 0.95 1.07 1.06 1.00 1.02  CALCIUM 9.3 8.7 8.6 8.8 8.9   Liver Function  Tests:  Recent Labs Lab 12/26/12 2155 12/27/12 0518  AST 14 11  ALT 10 9  ALKPHOS 60 56  BILITOT 0.3 0.5  PROT 7.7 6.8  ALBUMIN 3.3* 2.9*   No results found for this basename: LIPASE, AMYLASE,  in the last 168 hours No results found for this basename: AMMONIA,  in the last 168 hours CBC:  Recent Labs Lab 12/26/12 2155 12/27/12 0518 12/28/12 0515 12/29/12 0720  WBC 14.7* 20.4* 12.3* 9.4  NEUTROABS 12.6* 19.1*  --   --   HGB 13.1 12.5 11.8* 10.9*  HCT 39.7 37.2 35.5* 34.9*  MCV 90.2 90.5 90.8 91.8  PLT 197 185 169 148*   Cardiac Enzymes: No results found for this basename: CKTOTAL, CKMB, CKMBINDEX, TROPONINI,  in the last 168 hours BNP (last 3 results) No results found for this basename: PROBNP,  in the last 8760 hours CBG: No results found for this basename: GLUCAP,  in the last 168 hours  Recent Results (from the past 240 hour(s))  MRSA PCR SCREENING     Status: Abnormal   Collection Time    12/27/12  2:38 AM      Result Value Range Status   MRSA by PCR POSITIVE (*) NEGATIVE Final   Comment:  The GeneXpert MRSA Assay (FDA     approved for NASAL specimens     only), is one component of a     comprehensive MRSA colonization     surveillance program. It is not     intended to diagnose MRSA     infection nor to guide or     monitor treatment for     MRSA infections.     RESULT CALLED TO, READ BACK BY AND VERIFIED WITH:     R. BUENEIA,RN 12/27/12 0905 BY K SCHULTZ  CULTURE, BLOOD (ROUTINE X 2)     Status: None   Collection Time    12/27/12  2:26 PM      Result Value Range Status   Specimen Description BLOOD ARM RIGHT   Final   Special Requests BOTTLES DRAWN AEROBIC AND ANAEROBIC 10CC   Final   Culture  Setup Time 12/28/2012 00:42   Final   Culture     Final   Value:        BLOOD CULTURE RECEIVED NO GROWTH TO DATE CULTURE WILL BE HELD FOR 5 DAYS BEFORE ISSUING A FINAL NEGATIVE REPORT   Report Status PENDING   Incomplete  CULTURE, BLOOD (ROUTINE X 2)      Status: None   Collection Time    12/27/12  2:36 PM      Result Value Range Status   Specimen Description BLOOD HAND RIGHT   Final   Special Requests BOTTLES DRAWN AEROBIC ONLY 2.5CC   Final   Culture  Setup Time 12/28/2012 00:40   Final   Culture     Final   Value:        BLOOD CULTURE RECEIVED NO GROWTH TO DATE CULTURE WILL BE HELD FOR 5 DAYS BEFORE ISSUING A FINAL NEGATIVE REPORT   Report Status PENDING   Incomplete     Studies: No results found.  Scheduled Meds: . budesonide-formoterol  2 puff Inhalation BID  . Chlorhexidine Gluconate Cloth  6 each Topical Q0600  . enoxaparin (LOVENOX) injection  80 mg Subcutaneous Daily  . montelukast  10 mg Oral QHS  . multivitamin with minerals  1 tablet Oral Daily  . mupirocin ointment  1 application Nasal BID  . piperacillin-tazobactam (ZOSYN)  IV  3.375 g Intravenous Q8H  . vancomycin  1,750 mg Intravenous Q12H   Continuous Infusions:   Principal Problem:   Cellulitis, leg Active Problems:   ASTHMA    Time spent: 25    [facility] C  [facility] Pager [phone]. If 7PM-7AM, please contact night-coverage at [email or web address], password [facility] 12/31/2012, 12:07 AM  LOS: 5 days

## 2013-01-03 LAB — CULTURE, BLOOD (ROUTINE X 2)

## 2013-01-06 ENCOUNTER — Ambulatory Visit: Payer: No Typology Code available for payment source | Attending: Family Medicine | Admitting: Family Medicine

## 2013-01-06 VITALS — BP 139/93 | HR 90 | Temp 98.4°F | Ht 64.0 in | Wt 374.4 lb

## 2013-01-06 DIAGNOSIS — L03119 Cellulitis of unspecified part of limb: Secondary | ICD-10-CM

## 2013-01-06 DIAGNOSIS — B372 Candidiasis of skin and nail: Secondary | ICD-10-CM

## 2013-01-06 DIAGNOSIS — K029 Dental caries, unspecified: Secondary | ICD-10-CM

## 2013-01-06 DIAGNOSIS — R609 Edema, unspecified: Secondary | ICD-10-CM

## 2013-01-06 DIAGNOSIS — J45909 Unspecified asthma, uncomplicated: Secondary | ICD-10-CM

## 2013-01-06 DIAGNOSIS — L02419 Cutaneous abscess of limb, unspecified: Secondary | ICD-10-CM

## 2013-01-06 DIAGNOSIS — R21 Rash and other nonspecific skin eruption: Secondary | ICD-10-CM

## 2013-01-06 MED ORDER — KETOCONAZOLE 2 % EX CREA: TOPICAL_CREAM | Freq: Every day | CUTANEOUS | Status: DC

## 2013-01-06 MED ORDER — DOXYCYCLINE HYCLATE 100 MG PO TABS: 100.0000 mg | ORAL_TABLET | Freq: Two times a day (BID) | ORAL | Status: DC

## 2013-01-06 MED ORDER — FLUCONAZOLE 100 MG PO TABS: 100.0000 mg | ORAL_TABLET | Freq: Every day | ORAL | Status: DC

## 2013-01-06 NOTE — Progress Notes (Signed)
Patient ID: Marissa Joseph, female   DOB: 1981/02/08, 32 y.o.   MRN: [id number]  CC:  Chief Complaint  Patient presents with  . Hospitalization Follow-up  . Establish Care   HPI: Patient reports that she is continuing to take her antibiotics and reports that she is noticing an improvement in the edema and redness and heat in the left lower extremity since being discharged from the hospital.  The patient reports that she has noticed that she's getting a yeast infection in the folds of skin between the thighs.  She also reports that she's had some film and the roof of the mouth.  She reports that she has chronic problems with her teeth.  She would like to be seen by a dentist.  Allergies  Allergen Reactions  . Shellfish Allergy Hives, Shortness Of Breath and Swelling  . Avocado     Throat itching and swelling   Past Medical History  Diagnosis Date  . Asthma   . Fibroids   . Hypertension   . Obesity    Current Outpatient Prescriptions on File Prior to Visit  Medication Sig Dispense Refill  . albuterol (PROVENTIL HFA;VENTOLIN HFA) 108 (90 BASE) MCG/ACT inhaler Inhale 2 puffs into the lungs every 6 (six) hours as needed for wheezing or shortness of breath.      . Fluticasone-Salmeterol (ADVAIR DISKUS) 250-50 MCG/DOSE AEPB Inhale 1 puff into the lungs 2 (two) times daily.  60 each  0  . HYDROcodone-acetaminophen (NORCO/VICODIN) 5-325 MG per tablet Take 1 tablet by mouth every 4 (four) hours as needed.  30 tablet  0  . ibuprofen (ADVIL,MOTRIN) 200 MG tablet Take 200 mg by mouth every 6 (six) hours as needed for pain.      . montelukast (SINGULAIR) 10 MG tablet Take 1 tablet (10 mg total) by mouth at bedtime.  30 tablet  1  . Multiple Vitamin (MULTIVITAMIN WITH MINERALS) TABS Take 1 tablet by mouth daily.       No current facility-administered medications on file prior to visit.   Family History  Problem Relation Age of Onset  . Diabetes Father    History   Social History  .  Marital Status: Single    Spouse Name: N/A    Number of Children: N/A  . Years of Education: N/A   Occupational History  . Not on file.   Social History Main Topics  . Smoking status: Current Every Day Smoker    Types: Cigarettes  . Smokeless tobacco: Not on file  . Alcohol Use: Yes     Comment: occ  . Drug Use: No  . Sexually Active: Yes    Birth Control/ Protection: Condom   Other Topics Concern  . Not on file   Social History Narrative  . No narrative on file    Review of Systems  Constitutional: Negative for fever, chills, diaphoresis, activity change, appetite change and fatigue.  HENT: Negative for ear pain, nosebleeds, congestion, facial swelling, rhinorrhea, neck pain, neck stiffness and ear discharge.   Eyes: Negative for pain, discharge, redness, itching and visual disturbance.  Respiratory: Negative for cough, choking, chest tightness, shortness of breath, wheezing and stridor.   Cardiovascular: Negative for chest pain, palpitations and leg swelling.  Gastrointestinal: Negative for abdominal distention.  Genitourinary: Negative for dysuria, urgency, frequency, hematuria, flank pain, decreased urine volume, difficulty urinating and dyspareunia.  Musculoskeletal: bilateral LE edema.   Neurological: Negative for dizziness, tremors, seizures, syncope, facial asymmetry, speech difficulty, weakness, light-headedness, numbness and  headaches.  Hematological: Negative for adenopathy. Does not bruise/bleed easily.  Psychiatric/Behavioral: Negative for hallucinations, behavioral problems, confusion, dysphoric mood, decreased concentration and agitation.    Objective:   Filed Vitals:   01/06/13 1014  BP: 139/93  Pulse: 90  Temp: 98.4 F (36.9 C)    Physical Exam  Constitutional: Appears well-developed and well-nourished. No distress.  HENT: Normocephalic. External right and left ear normal. Oropharynx is clear and moist.  severe dental caries.  Eyes: Conjunctivae  and EOM are normal. PERRLA, no scleral icterus.  Neck: Normal ROM. Neck supple. No JVD. No tracheal deviation. No thyromegaly.  CVS: RRR, S1/S2 +, no murmurs, no gallops, no carotid bruit.  Pulmonary: Effort and breath sounds normal, no stridor, rhonchi, wheezes, rales.  Abdominal: Soft. BS +,  no distension, tenderness, rebound or guarding.  Musculoskeletal: Normal range of motion. No edema and no tenderness.  Lymphadenopathy: No lymphadenopathy noted, cervical, inguinal. Neuro: Alert. Normal reflexes, muscle tone coordination. No cranial nerve deficit. Skin: bilateral stasis dermatitis bilateral LEs.  Cellulitis in LLE improving, very mild erythema seen.   Psychiatric: Normal mood and affect. Behavior, judgment, thought content normal.   Lab Results  Component Value Date   WBC 9.4 12/29/2012   HGB 10.9* 12/29/2012   HCT 34.9* 12/29/2012   MCV 91.8 12/29/2012   PLT 148* 12/29/2012   Lab Results  Component Value Date   CREATININE 1.02 12/30/2012   BUN 7 12/30/2012   NA 138 12/30/2012   K 4.0 12/30/2012   CL 103 12/30/2012   CO2 29 12/30/2012    Lab Results  Component Value Date   HGBA1C 5.6 08/20/2012   Lipid Panel     Component Value Date/Time   CHOL 124 03/24/2008 2216   TRIG 66 03/24/2008 2216   HDL 39* 03/24/2008 2216   CHOLHDL 3.2 Ratio 03/24/2008 2216   VLDL 13 03/24/2008 2216   LDLCALC 72 03/24/2008 2216       Assessment and plan:   Patient Active Problem List   Diagnosis Date Noted  . Monilial intertrigo 01/06/2013  . Influenza-like illness 06/03/2012  . Cellulitis, leg 03/06/2012  . ALLERGIC RHINITIS 04/10/2008  . ASTHMA, WITH ACUTE EXACERBATION 04/03/2008  . GONORRHEA 03/25/2008  . FIBROIDS, UTERUS 03/24/2008  . TINEA PEDIS 11/22/2007  . DYSMENORRHEA 11/22/2007  . HEEL PAIN, LEFT 11/22/2007  . DEPENDENT EDEMA, LEGS, BILATERAL 08/06/2007  . SLEEP APNEA, OBSTRUCTIVE, SEVERE 03/25/2007  . ECZEMA 02/21/2007  . SKIN RASH 02/21/2007  . OBESITY NOS 01/21/2007  .  ASTHMA 01/21/2007  . CARIES, DENTAL NEC 01/21/2007   Cellulitis is improving.  Will have patient continue noted several more days of on doxycycline oral tablets.  Candidal intertrigo-Will have patient to take fluconazole 100 mg by mouth daily for an additional 5 days and also topical ketoconazole 2% cream apply to the rash 1-2 times daily until completely resolved.  Patient is being referred to a dentist for evaluation and treatment.  Followup in 7 days for recheck  The patient was given clear instructions to go to ER or return to medical center if symptoms don't improve, worsen or new problems develop.  The patient verbalized understanding.  The patient was told to call to get any lab results if not heard anything in the next week.    Rodney Langton, MD, CDE, FAAFP [facility] [place], [place]

## 2013-01-06 NOTE — Patient Instructions (Addendum)
Dental Caries   Tooth decay (dental caries, cavities) is the most common of all oral diseases. It occurs in all ages but is more common in children and young adults.   CAUSES   Bacteria in your mouth combine with foods (particularly sugars and starches) to produce plaque. Plaque is a substance that sticks to the hard surfaces of teeth. The bacteria in the plaque produce acids that attack the enamel of teeth. Repeated acid attacks dissolve the enamel and create holes in the teeth. Root surfaces of teeth may also get these holes.   Other contributing factors include:    Frequent snacking and drinking of cavity-producing foods and liquids.   Poor oral hygiene.   Dry mouth.   Substance abuse such as methamphetamine.   Broken or poor fitting dental restorations.   Eating disorders.   Gastroesophageal reflux disease (GERD).   Certain radiation treatments to the head and neck.  SYMPTOMS   At first, dental decay appears as white, chalky areas on the enamel. In this early stage, symptoms are seldom present. As the decay progresses, pits and holes may appear on the enamel surfaces. Progression of the decay will lead to softening of the hard layers of the tooth. At this point you may experience some pain or achy feeling after sweet, hot, or cold foods or drinks are consumed. If left untreated, the decay will reach the internal structures of the tooth and produce severe pain. Extensive dental treatment, such as root canal therapy, may be needed to save the tooth at this late stage of decay development.   DIAGNOSIS   Most cavities will be detected during regular check-ups. A thorough medical and dental history will be taken by the dentist. The dentist will use instruments to check the surfaces of your teeth for any breakdown or discoloration. Some dentists have special instruments, such as lasers, that detect tooth decay. Dental X-rays may also show some cavities that are not visible to the eye (such as between the  contact areas of the teeth).  TREATMENT   Treatment involves removal of the tooth decay and replacement with a restorative material such as silver, gold, or composite (white) material. However, if the decay involves a large area of the tooth and there is little remaining healthy tooth structure, a cap (crown) will be fitted over the remaining structure. If the decay involves the center part of the tooth (pulp), root canal treatment will be needed before any type of dental restoration is placed. If the tooth is severely destroyed by the decay process, leaving the remaining tooth structures unrestorable, the tooth will need to be pulled (extracted). Some early tooth decay may be reversed by fluoride treatments and thorough brushing and flossing at home.  PREVENTION    Eat healthy foods. Restrict the amount of sugary, starchy foods and liquids you consume. Avoid frequent snacking and drinking of unhealthy foods and liquids.   Sealants can help with prevention of cavities. Sealants are composite resins applied onto the biting surfaces of teeth at risk for decay. They smooth out the pits and grooves and prevent food from being trapped in them. This is done in early childhood before tooth decay has started.   Fluoride tablets may also be prescribed to children between 6 months and 10 years of age if your drinking water is not fluoridated. The fluoride absorbed by the tooth enamel makes teeth less susceptible to decay. Thorough daily cleaning with a toothbrush and dental floss is the best way to prevent   fluoride by your dentist is important in children.  Regular visits with a dentist for checkups and cleanings are also important. SEEK IMMEDIATE DENTAL CARE IF:  You have a fever.  You develop redness and swelling of your face, jaw, or neck.  You develop swelling around a  tooth.  You are unable to open your mouth or cannot swallow.  You have severe pain uncontrolled by pain medicine. Document Released: 02/11/2002 Document Revised: 08/14/2011 Document Reviewed: 10/27/2010 [facility] Patient Information 2014 [place], [place]. Intertrigo Intertrigo is a skin condition that occurs in between folds of skin in places on the body that rub together a lot and do not get much ventilation. It is caused by heat, moisture, friction, sweat retention, and lack of air circulation, which produces red, irritated patches and, sometimes, scaling or drainage. People who have diabetes, who are obese, or who have treatment with antibiotics are at increased risk for intertrigo. The most common sites for intertrigo to occur include:  The groin.  The breasts.  The armpits.  Folds of abdominal skin.  Webbed spaces between the fingers or toes. Intertrigo may be aggravated by:  Sweat.  Feces.  Yeast or bacteria that are present near skin folds.  Urine.  Vaginal discharge. HOME CARE INSTRUCTIONS  The following steps can be taken to reduce friction and keep the affected area cool and dry:  Expose skin folds to the air.  Keep deep skin folds separated with cotton or linen cloth. Avoid tight fitting clothing that could cause chafing.  Wear open-toed shoes or sandals to help reduce moisture between the toes.  Apply absorbent powders to affected areas as directed by your caregiver.  Apply over-the-counter barrier pastes, such as zinc oxide, as directed by your caregiver.  If you develop a fungal infection in the affected area, your caregiver may have you use antifungal creams. SEEK MEDICAL CARE IF:   The rash is not improving after 1 week of treatment.  The rash is getting worse (more red, more swollen, more painful, or spreading).  You have a fever or chills. MAKE SURE YOU:   Understand these instructions.  Will watch your condition.  Will get help right away if  you are not doing well or get worse. Document Released: 05/22/2005 Document Revised: 08/14/2011 Document Reviewed: 11/04/2009 [facility] Patient Information 2014 [place], [place]. Cellulitis Cellulitis is an infection of the skin and the tissue beneath it. The infected area is usually red and tender. Cellulitis occurs most often in the arms and lower legs.  CAUSES  Cellulitis is caused by bacteria that enter the skin through cracks or cuts in the skin. The most common types of bacteria that cause cellulitis are Staphylococcus and Streptococcus. SYMPTOMS   Redness and warmth.  Swelling.  Tenderness or pain.  Fever. DIAGNOSIS  Your caregiver can usually determine what is wrong based on a physical exam. Blood tests may also be done. TREATMENT  Treatment usually involves taking an antibiotic medicine. HOME CARE INSTRUCTIONS   Take your antibiotics as directed. Finish them even if you start to feel better.  Keep the infected arm or leg elevated to reduce swelling.  Apply a warm cloth to the affected area up to 4 times per day to relieve pain.  Only take over-the-counter or prescription medicines for pain, discomfort, or fever as directed by your caregiver.  Keep all follow-up appointments as directed by your caregiver. SEEK MEDICAL CARE IF:   You notice red streaks coming from the infected area.  Your red area  gets larger or turns dark in color.  Your bone or joint underneath the infected area becomes painful after the skin has healed.  Your infection returns in the same area or another area.  You notice a swollen bump in the infected area.  You develop new symptoms. SEEK IMMEDIATE MEDICAL CARE IF:   You have a fever.  You feel very sleepy.  You develop vomiting or diarrhea.  You have a general ill feeling (malaise) with muscle aches and pains. MAKE SURE YOU:   Understand these instructions.  Will watch your condition.  Will get help right away if you are not doing  well or get worse. Document Released: 03/01/2005 Document Revised: 11/21/2011 Document Reviewed: 08/07/2011 [facility] Patient Information 2014 [place], [place].

## 2013-01-13 ENCOUNTER — Ambulatory Visit: Payer: Self-pay | Attending: Family Medicine

## 2013-01-13 ENCOUNTER — Ambulatory Visit: Payer: No Typology Code available for payment source | Attending: Family Medicine | Admitting: Internal Medicine

## 2013-01-13 ENCOUNTER — Encounter: Payer: Self-pay | Admitting: Internal Medicine

## 2013-01-13 VITALS — BP 135/86 | HR 85 | Temp 98.6°F | Resp 18 | Ht 64.0 in | Wt 381.0 lb

## 2013-01-13 DIAGNOSIS — J45901 Unspecified asthma with (acute) exacerbation: Secondary | ICD-10-CM

## 2013-01-13 DIAGNOSIS — L0291 Cutaneous abscess, unspecified: Secondary | ICD-10-CM

## 2013-01-13 DIAGNOSIS — R609 Edema, unspecified: Secondary | ICD-10-CM

## 2013-01-13 DIAGNOSIS — E669 Obesity, unspecified: Secondary | ICD-10-CM

## 2013-01-13 DIAGNOSIS — L03119 Cellulitis of unspecified part of limb: Secondary | ICD-10-CM | POA: Insufficient documentation

## 2013-01-13 DIAGNOSIS — L0390 Cellulitis, unspecified: Secondary | ICD-10-CM

## 2013-01-13 DIAGNOSIS — L02419 Cutaneous abscess of limb, unspecified: Secondary | ICD-10-CM | POA: Insufficient documentation

## 2013-01-13 MED ORDER — HYDROCHLOROTHIAZIDE 12.5 MG PO TABS: 12.5000 mg | ORAL_TABLET | Freq: Every day | ORAL | Status: DC

## 2013-01-13 MED ORDER — POTASSIUM CHLORIDE ER 10 MEQ PO TBCR: 20.0000 meq | EXTENDED_RELEASE_TABLET | Freq: Every day | ORAL | Status: DC

## 2013-01-13 NOTE — Progress Notes (Signed)
Patient ID: Marissa Joseph, female   DOB: [birth year], 32 y.o.   MRN: [id number] Patient Demographics  Marissa Joseph, is a 32 y.o. female  WUJ:[id number]  [id number]:[id number]  DOB - March 24, 1981  Chief Complaint  Patient presents with  . Follow-up  . Cellulitis        Subjective:   Marissa Joseph today is here for a follow up visit. She was seen in the clinic last week by Dr. Laural Benes following a hospital discharge for left lower extremity cellulitis. 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 She has completed a course of doxycycline, she last took doxycycline last Thursday. She claims that the erythema in the left lower extremity has almost resolved, she claims that her chronic venous stasis changes have also somewhat gotten better with less "darkness". She denies any fever. She uses a compression stockings to both her bilateral lower extremities. She is worried about swelling in her bilateral lower extremities, at some point in the past, she was on hydrochlorothiazide and wants to try this again.  Patient has No headache, No chest pain, No abdominal pain - No Nausea, No new weakness tingling or numbness, No Cough - SOB.   Objective:    Filed Vitals:   01/13/13 1045  BP: 135/86  Pulse: 85  Temp: 98.6 F (37 C)  TempSrc: Oral  Resp: 18  Height: 5\' 4"  (1.626 m)  Weight: 381 lb (172.82 kg)  SpO2: 97%     ALLERGIES:   Allergies  Allergen Reactions  . Shellfish Allergy Hives, Shortness Of Breath and Swelling  . Avocado     Throat itching and swelling    PAST MEDICAL HISTORY: Past Medical History  Diagnosis Date  . Asthma   . Fibroids   . Hypertension   . Obesity     MEDICATIONS AT HOME: Prior to Admission medications   Medication Sig Start Date End Date Taking? Authorizing Provider  albuterol (PROVENTIL HFA;VENTOLIN HFA) 108 (90 BASE) MCG/ACT inhaler  Inhale 2 puffs into the lungs every 6 (six) hours as needed for wheezing or shortness of breath. 08/20/12  Yes Dorothea Ogle, MD  ketoconazole (NIZORAL) 2 % cream Apply topically daily. 01/06/13  Yes Clanford Cyndie Mull, MD  montelukast (SINGULAIR) 10 MG tablet Take 1 tablet (10 mg total) by mouth at bedtime. 12/31/12  Yes Adeline Joselyn Glassman, MD  Multiple Vitamin (MULTIVITAMIN WITH MINERALS) TABS Take 1 tablet by mouth daily.   Yes Historical Provider, MD  doxycycline (VIBRA-TABS) 100 MG tablet Take 1 tablet (100 mg total) by mouth every 12 (twelve) hours. 01/06/13   Clanford Cyndie Mull, MD  fluconazole (DIFLUCAN) 100 MG tablet Take 1 tablet (100 mg total) by mouth daily. 01/06/13   Clanford Cyndie Mull, MD  Fluticasone-Salmeterol (ADVAIR DISKUS) 250-50 MCG/DOSE AEPB Inhale 1 puff into the lungs 2 (two) times daily. 12/31/12   [name], MD  hydrochlorothiazide (HYDRODIURIL) 12.5 MG tablet Take 1 tablet (12.5 mg total) by mouth daily. 01/13/13   Rosaleigh Brazzel Levora Dredge, MD  HYDROcodone-acetaminophen (NORCO/VICODIN) 5-325 MG per tablet Take 1 tablet by mouth every 4 (four) hours as needed. 12/31/12   [name], MD  ibuprofen (ADVIL,MOTRIN) 200 MG tablet Take 200 mg by mouth every 6 (six) hours as needed for pain.    Historical Provider, MD  potassium chloride (K-DUR) 10 MEQ tablet Take 2 tablets (20 mEq total) by mouth daily. 01/13/13  Nuvia Hileman Levora Dredge, MD     Exam  General appearance :Awake, alert, not in any distress. Speech Clear. Not toxic Looking HEENT: Atraumatic and Normocephalic, pupils equally reactive to light and accomodation Neck: supple, no JVD. No cervical lymphadenopathy.  Chest:Good air entry bilaterally, no added sounds  CVS: S1 S2 regular, no murmurs.  Abdomen: Bowel sounds present, Non tender and not distended with no gaurding, rigidity or rebound. Bilateral lower extremities-bilateral stasis dermatitis bilateral LEs. No evidence of any erythema at present, the left lower extremity   Neurology: Awake alert, and oriented X 3, CN II-XII intact, Non focal Skin:No Rash Wounds:N/A    Data Review   CBC No results found for this basename: WBC, HGB, HCT, PLT, MCV, MCH, MCHC, RDW, NEUTRABS, LYMPHSABS, MONOABS, EOSABS, BASOSABS, BANDABS, BANDSABD,  in the last 168 hours  Chemistries   No results found for this basename: NA, K, CL, CO2, GLUCOSE, BUN, CREATININE, GFRCGP, CALCIUM, MG, AST, ALT, ALKPHOS, BILITOT,  in the last 168 hours ------------------------------------------------------------------------------------------------------------------ No results found for this basename: HGBA1C,  in the last 72 hours ------------------------------------------------------------------------------------------------------------------ No results found for this basename: CHOL, HDL, LDLCALC, TRIG, CHOLHDL, LDLDIRECT,  in the last 72 hours ------------------------------------------------------------------------------------------------------------------ No results found for this basename: TSH, T4TOTAL, FREET3, T3FREE, THYROIDAB,  in the last 72 hours ------------------------------------------------------------------------------------------------------------------ No results found for this basename: VITAMINB12, FOLATE, FERRITIN, TIBC, IRON, RETICCTPCT,  in the last 72 hours  Coagulation profile  No results found for this basename: INR, PROTIME,  in the last 168 hours    Assessment & Plan   Cellulitis of left lower extremity - Clinically better per patient-no erythema seen on physical exam today. - Monitor off  Chronic bilateral lower extremity swelling/chronic venous stasis changes - Trial of low-dose hydrochlorothiazide along with potassium supplementation. Continue to use bilateral lower extremity compression stockings. - Have him soak patient regarding the importance of weight loss. She understands and seems to be motivated about exercise.  Bronchial asthma - Lungs clear - This  is stable at this time  Morbid obesity - See above regarding counseling  Anemia - Recheck CBC prior to next visit  The patient was given clear instructions to go to ER or return to medical center if symptoms don't improve, worsen or new problems develop. The patient verbalized understanding. The patient was told to call to get lab results if they haven't heard anything in the next week.   Followup in one month

## 2013-01-13 NOTE — Progress Notes (Signed)
Pt here for f/u left leg cellulitis. Pt was seen last week and prescribed Doxycycline. Finished course today.  Leg warn to touch, + sensation. Denies pain or fevers. Vss. Pt requesting medication for fluid retention

## 2013-01-27 ENCOUNTER — Emergency Department (HOSPITAL_COMMUNITY)
Admission: EM | Admit: 2013-01-27 | Discharge: 2013-01-27 | Disposition: A | Discharge status: Home / Self Care | Payer: No Typology Code available for payment source | Attending: Emergency Medicine | Admitting: Emergency Medicine

## 2013-01-27 ENCOUNTER — Encounter (HOSPITAL_COMMUNITY): Payer: Self-pay | Admitting: Emergency Medicine

## 2013-01-27 DIAGNOSIS — Z79899 Other long term (current) drug therapy: Secondary | ICD-10-CM | POA: Insufficient documentation

## 2013-01-27 DIAGNOSIS — J40 Bronchitis, not specified as acute or chronic: Secondary | ICD-10-CM | POA: Insufficient documentation

## 2013-01-27 DIAGNOSIS — F17200 Nicotine dependence, unspecified, uncomplicated: Secondary | ICD-10-CM | POA: Insufficient documentation

## 2013-01-27 DIAGNOSIS — I10 Essential (primary) hypertension: Secondary | ICD-10-CM | POA: Insufficient documentation

## 2013-01-27 DIAGNOSIS — Z8742 Personal history of other diseases of the female genital tract: Secondary | ICD-10-CM | POA: Insufficient documentation

## 2013-01-27 DIAGNOSIS — E669 Obesity, unspecified: Secondary | ICD-10-CM | POA: Insufficient documentation

## 2013-01-27 MED ORDER — CETIRIZINE-PSEUDOEPHEDRINE ER 5-120 MG PO TB12: 1.0000 | ORAL_TABLET | Freq: Two times a day (BID) | ORAL | Status: DC

## 2013-01-27 MED ORDER — AZITHROMYCIN 250 MG PO TABS: ORAL_TABLET | ORAL | Status: DC

## 2013-01-27 MED ORDER — BENZONATATE 200 MG PO CAPS: 200.0000 mg | ORAL_CAPSULE | Freq: Three times a day (TID) | ORAL | Status: DC | PRN

## 2013-01-27 MED ORDER — ALBUTEROL SULFATE (5 MG/ML) 0.5% IN NEBU
5.0000 mg | INHALATION_SOLUTION | Freq: Once | RESPIRATORY_TRACT | Status: AC
  Administered 2013-01-27: 5 mg via RESPIRATORY_TRACT
  Filled 2013-01-27: qty 1

## 2013-01-27 MED ORDER — PREDNISONE 20 MG PO TABS
60.0000 mg | ORAL_TABLET | Freq: Once | ORAL | Status: AC
  Administered 2013-01-27: 60 mg via ORAL
  Filled 2013-01-27: qty 3

## 2013-01-27 MED ORDER — GUAIFENESIN ER 600 MG PO TB12: 1200.0000 mg | ORAL_TABLET | Freq: Two times a day (BID) | ORAL | Status: DC

## 2013-01-27 NOTE — ED Notes (Signed)
PT. REPORTS ASTHMA ATTACK THIS MORNING UNRELIEVED BY MDI / OCCASIONAL PRODUCTIVE COUGH ONSET YESTERDAY . DENIES FEVER OR CHILLS.

## 2013-01-27 NOTE — ED Provider Notes (Signed)
CSN: [id number]     Arrival date & time 01/27/13  1916 History   First MD Initiated Contact with Patient 01/27/13 2122     Chief Complaint  Patient presents with  . Asthma   HPI  History provided by the patient. Patient is a 32 year old morbidly obese female with past history of asthma, hypertension who presents with complaints of worsening productive cough, wheezing and shortness of breath. Symptoms first began yesterday evening with mild coughing. This was followed by worse congestion and postnasal drip and increased coughing today. Cough has been productive of clear phlegm. Patient went to work as normal and try to use one of her albuterol breathing treatments around noon and she became more short of breath with wheezing. This did not help significantly and patient had to leave work early. She used her breathing treatment a home several additional times without any improvement. She denies any associated fever, chills or sweats. She has not traveled anywhere recently. Denies any specific known sick contacts. Patient does not use estrogen or birth control. She has not traveled anywhere recently. Denies any hemoptysis. No chest pain. She has no prior history of DVT or PE. Patient was a former smoker but reports quitting one week ago. No other aggravating or alleviating factors.     Past Medical History  Diagnosis Date  . Asthma   . Fibroids   . Hypertension   . Obesity    History reviewed. No pertinent past surgical history. Family History  Problem Relation Age of Onset  . Diabetes Father    History  Substance Use Topics  . Smoking status: Current Every Day Smoker    Types: Cigarettes  . Smokeless tobacco: Not on file  . Alcohol Use: Yes     Comment: occ   OB History   Grav Para Term Preterm Abortions TAB SAB Ect Mult Living                 Review of Systems  Constitutional: Negative for fever, chills and diaphoresis.  HENT: Positive for congestion and postnasal drip. Negative  for sore throat.   Respiratory: Positive for cough, shortness of breath and wheezing. Negative for stridor.   Cardiovascular: Negative for chest pain.  Gastrointestinal: Negative for vomiting and diarrhea.  All other systems reviewed and are negative.    Allergies  Shellfish allergy and Avocado  Home Medications   Current Outpatient Rx  Name  Route  Sig  Dispense  Refill  . albuterol (PROVENTIL HFA;VENTOLIN HFA) 108 (90 BASE) MCG/ACT inhaler   Inhalation   Inhale 2 puffs into the lungs every 6 (six) hours as needed for wheezing or shortness of breath.         . doxycycline (VIBRA-TABS) 100 MG tablet   Oral   Take 1 tablet (100 mg total) by mouth every 12 (twelve) hours.   6 tablet   0   . fluconazole (DIFLUCAN) 100 MG tablet   Oral   Take 1 tablet (100 mg total) by mouth daily.   5 tablet   0   . Fluticasone-Salmeterol (ADVAIR DISKUS) 250-50 MCG/DOSE AEPB   Inhalation   Inhale 1 puff into the lungs 2 (two) times daily.   60 each   0   . hydrochlorothiazide (HYDRODIURIL) 12.5 MG tablet   Oral   Take 1 tablet (12.5 mg total) by mouth daily.   30 tablet   3   . HYDROcodone-acetaminophen (NORCO/VICODIN) 5-325 MG per tablet   Oral   Take 1  tablet by mouth every 4 (four) hours as needed.   30 tablet   0   . ibuprofen (ADVIL,MOTRIN) 200 MG tablet   Oral   Take 200 mg by mouth every 6 (six) hours as needed for pain.         Marland Kitchen ketoconazole (NIZORAL) 2 % cream   Topical   Apply topically daily.   15 g   1   . montelukast (SINGULAIR) 10 MG tablet   Oral   Take 1 tablet (10 mg total) by mouth at bedtime.   30 tablet   1   . Multiple Vitamin (MULTIVITAMIN WITH MINERALS) TABS   Oral   Take 1 tablet by mouth daily.         . potassium chloride (K-DUR) 10 MEQ tablet   Oral   Take 2 tablets (20 mEq total) by mouth daily.   30 tablet   3    BP 144/85  Pulse 97  Temp(Src) 98.2 F (36.8 C) (Oral)  Resp 14  SpO2 96%  LMP 12/26/2012 Physical Exam   Nursing note and vitals reviewed. Constitutional: She is oriented to person, place, and time. She appears well-developed and well-nourished. No distress.  Morbidly obese  HENT:  Head: Normocephalic.  Mouth/Throat: Oropharynx is clear and moist.  Mild irritation and cobblestoning to the posterior pharynx. Tonsils normal without erythema or exudate. Uvula midline. Mild edema in nostrils bilaterally. No significant discharge. Normal TMs bilaterally  Eyes: Conjunctivae are normal.  Neck: Normal range of motion. Neck supple.  No meningeal signs  Cardiovascular: Normal rate and regular rhythm.   No murmur heard. Pulmonary/Chest: Effort normal and breath sounds normal. No stridor. No respiratory distress. She has no wheezes. She has no rales.  Occasional cough  Abdominal: Soft.  Musculoskeletal: Normal range of motion.  Neurological: She is alert and oriented to person, place, and time.  Skin: Skin is warm and dry. No rash noted.  Psychiatric: She has a normal mood and affect. Her behavior is normal.    ED Course  Procedures   Labs Review Labs Reviewed - No data to display  Imaging Review  No results found.  MDM   1. Bronchitis      Patient seen and evaluated. Currently with normal respirations and O2 sats on room air. She did report having some improvements after initial breathing treatment in triage. No significant wheezing at this time. Lungs clear with normal air movement bilaterally. She does have occasional bouts of coughing.  Patient further improved after second breathing treatment. Normal respirations and good O2 sats. Very mild tachycardia after albuterol. She is feeling ready to be discharged home.  Angus Seller, PA-C 01/27/13 2236

## 2013-01-28 NOTE — ED Provider Notes (Signed)
Medical screening examination/treatment/procedure(s) were performed by non-physician practitioner and as supervising physician I was immediately available for consultation/collaboration.   Maelys Kinnick T Tranisha Tissue, MD 01/28/13 1031 

## 2013-02-07 ENCOUNTER — Telehealth: Payer: Self-pay | Admitting: Internal Medicine

## 2013-02-07 ENCOUNTER — Other Ambulatory Visit: Payer: Self-pay | Admitting: Internal Medicine

## 2013-02-07 MED ORDER — ALBUTEROL SULFATE HFA 108 (90 BASE) MCG/ACT IN AERS: 2.0000 | INHALATION_SPRAY | Freq: Four times a day (QID) | RESPIRATORY_TRACT | Status: DC | PRN

## 2013-02-07 NOTE — Telephone Encounter (Signed)
Pt called in today to see of she can get a script refilled before she goes out of town; albuterol (PROVENTIL HFA;VENTOLIN HFA) 108 (90 BASE) MCG/ACT inhaler; pt uses [facility] on [address]

## 2013-02-07 NOTE — Telephone Encounter (Signed)
Albuterol has been eprescribed to [facility] on [address].

## 2013-02-13 ENCOUNTER — Ambulatory Visit: Payer: Self-pay

## 2013-03-23 ENCOUNTER — Inpatient Hospital Stay (HOSPITAL_COMMUNITY)
Admission: AD | Admit: 2013-03-23 | Discharge: 2013-03-23 | Disposition: A | Discharge status: Home / Self Care | Payer: No Typology Code available for payment source | Source: Ambulatory Visit | Attending: Obstetrics and Gynecology | Admitting: Obstetrics and Gynecology

## 2013-03-23 ENCOUNTER — Encounter (HOSPITAL_COMMUNITY): Payer: Self-pay | Admitting: *Deleted

## 2013-03-23 DIAGNOSIS — R109 Unspecified abdominal pain: Secondary | ICD-10-CM | POA: Insufficient documentation

## 2013-03-23 DIAGNOSIS — N946 Dysmenorrhea, unspecified: Secondary | ICD-10-CM | POA: Insufficient documentation

## 2013-03-23 HISTORY — DX: Anxiety disorder, unspecified: F41.9

## 2013-03-23 HISTORY — DX: Carrier or suspected carrier of methicillin resistant Staphylococcus aureus: Z22.322

## 2013-03-23 HISTORY — DX: Dermatitis, unspecified: L30.9

## 2013-03-23 HISTORY — DX: Cardiac murmur, unspecified: R01.1

## 2013-03-23 LAB — URINE MICROSCOPIC-ADD ON

## 2013-03-23 LAB — CBC
HCT: 37.1 % (ref 36.0–46.0)
Hemoglobin: 12.2 g/dL (ref 12.0–15.0)
MCHC: 32.9 g/dL (ref 30.0–36.0)
MCV: 89.8 fL (ref 78.0–100.0)
RBC: 4.13 MIL/uL (ref 3.87–5.11)
WBC: 6.8 10*3/uL (ref 4.0–10.5)

## 2013-03-23 LAB — URINALYSIS, ROUTINE W REFLEX MICROSCOPIC
Bilirubin Urine: NEGATIVE
Nitrite: NEGATIVE
Protein, ur: 30 mg/dL — AB
Urobilinogen, UA: 1 mg/dL (ref 0.0–1.0)

## 2013-03-23 LAB — POCT PREGNANCY, URINE: Preg Test, Ur: NEGATIVE

## 2013-03-23 LAB — WET PREP, GENITAL
Trich, Wet Prep: NONE SEEN
Yeast Wet Prep HPF POC: NONE SEEN

## 2013-03-23 MED ORDER — KETOROLAC TROMETHAMINE 60 MG/2ML IM SOLN
60.0000 mg | Freq: Once | INTRAMUSCULAR | Status: AC
  Administered 2013-03-23: 60 mg via INTRAMUSCULAR
  Filled 2013-03-23: qty 2

## 2013-03-23 MED ORDER — ONDANSETRON 8 MG PO TBDP
8.0000 mg | ORAL_TABLET | Freq: Once | ORAL | Status: AC
  Administered 2013-03-23: 8 mg via ORAL
  Filled 2013-03-23: qty 1

## 2013-03-23 NOTE — MAU Note (Signed)
abd pain started around 0100, Sat morning.  Woke her up, died down some, worse this morning. Started in lower mid abd, now all over.

## 2013-03-23 NOTE — MAU Provider Note (Signed)
History     CSN: [id number]  Arrival date and time: 03/23/13 1423   None     Chief Complaint  Patient presents with  . Abdominal Pain   HPI Comments: Jamine Wingate Barrasso morbidly obese, 32 y.o. G0P0 presents to MAU for abdominal pains that started last night at 1 am. She had some nausea and vomited once yesterday. She started her menses yesterday and has a history of dysmenorrhea. States pain is 10/10. She also has a history of fibroids that have not been evaluated since 2011. She has same sexual; partner, uses condoms.      Patient is a 32 y.o. female presenting with abdominal pain.  Abdominal Pain The primary symptoms of the illness include abdominal pain, nausea and vomiting.     Past Medical History  Diagnosis Date  . Asthma   . Fibroids   . Hypertension   . Obesity   . Heart murmur   . Eczema   . MRSA (methicillin resistant staph aureus) culture positive 05/2012    cellulits/ left leg  . Anxiety     no official dx    Past Surgical History  Procedure Laterality Date  . No past surgeries      Family History  Problem Relation Age of Onset  . Diabetes Father   . Diabetes Mother   . Cancer Maternal Uncle     prostate  . Cancer Paternal Aunt     lung  . Heart disease Maternal Grandmother   . Diabetes Maternal Grandmother   . Stroke Maternal Grandfather     History  Substance Use Topics  . Smoking status: Current Every Day Smoker -- 0.25 packs/day for 1 years    Types: Cigarettes  . Smokeless tobacco: Never Used  . Alcohol Use: Yes     Comment: occ    Allergies:  Allergies  Allergen Reactions  . Shellfish Allergy Hives, Shortness Of Breath and Swelling  . Avocado     Throat itching and swelling    Prescriptions prior to admission  Medication Sig Dispense Refill  . albuterol (PROVENTIL HFA;VENTOLIN HFA) 108 (90 BASE) MCG/ACT inhaler Inhale 2 puffs into the lungs every 6 (six) hours as needed for wheezing or shortness of breath.  1 Inhaler  3   . azithromycin (ZITHROMAX Z-PAK) 250 MG tablet Take 2 tablets on day one. Take one tablet on days 2 through 5.  6 tablet  0  . benzonatate (TESSALON) 200 MG capsule Take 1 capsule (200 mg total) by mouth 3 (three) times daily as needed for cough.  20 capsule  0  . budesonide-formoterol (SYMBICORT) 160-4.5 MCG/ACT inhaler Inhale 2 puffs into the lungs 2 (two) times daily as needed (shortness of breath).      . cetirizine-pseudoephedrine (ZYRTEC-D) 5-120 MG per tablet Take 1 tablet by mouth 2 (two) times daily.  30 tablet  0  . Fluticasone-Salmeterol (ADVAIR DISKUS) 250-50 MCG/DOSE AEPB Inhale 1 puff into the lungs 2 (two) times daily.  60 each  0  . guaiFENesin (MUCINEX) 600 MG 12 hr tablet Take 2 tablets (1,200 mg total) by mouth 2 (two) times daily.  60 tablet  0  . hydrochlorothiazide (HYDRODIURIL) 12.5 MG tablet Take 12.5 mg by mouth daily.      Marland Kitchen HYDROcodone-acetaminophen (NORCO/VICODIN) 5-325 MG per tablet Take 1 tablet by mouth every 4 (four) hours as needed for pain.      . montelukast (SINGULAIR) 10 MG tablet Take 10 mg by mouth at bedtime.      Marland Kitchen  potassium chloride (K-DUR) 10 MEQ tablet Take 20 mEq by mouth daily.        Review of Systems  Constitutional: Negative.   HENT: Negative.   Eyes: Negative.  Negative for blurred vision.  Respiratory: Negative.   Cardiovascular: Negative.   Gastrointestinal: Positive for nausea, vomiting and abdominal pain.  Genitourinary: Negative.   Musculoskeletal: Negative.   Skin: Negative.   Neurological: Negative.   Psychiatric/Behavioral: Negative.    Physical Exam   Last menstrual period 03/22/2013.  Physical Exam  Constitutional: She is oriented to person, place, and time. She appears well-developed and well-nourished. No distress.  Morbid Obesity  HENT:  Head: Normocephalic and atraumatic.  Eyes: Pupils are equal, round, and reactive to light.  Cardiovascular: Normal rate, regular rhythm and normal heart sounds.   Respiratory: Effort  normal and breath sounds normal. She has no wheezes.  GI: Soft. Bowel sounds are normal. She exhibits no distension. There is tenderness.  Tender over entire abdomen   Genitourinary:  External: negative Vaginal: mod amount blood Cervix: closed , no lesions Biman: No CMT/ unable to palpate uterus   Neurological: She is alert and oriented to person, place, and time.  Skin: Skin is warm and dry.  Psychiatric: She has a normal mood and affect. Her behavior is normal. Judgment and thought content normal.   Results for orders placed during the hospital encounter of 03/23/13 (from the past 24 hour(s))  URINALYSIS, ROUTINE W REFLEX MICROSCOPIC     Status: Abnormal   Collection Time    03/23/13  2:35 PM      Result Value Range   Color, Urine RED (*) YELLOW   APPearance HAZY (*) CLEAR   Specific Gravity, Urine 1.020  1.005 - 1.030   pH 7.0  5.0 - 8.0   Glucose, UA NEGATIVE  NEGATIVE mg/dL   Hgb urine dipstick LARGE (*) NEGATIVE   Bilirubin Urine NEGATIVE  NEGATIVE   Ketones, ur NEGATIVE  NEGATIVE mg/dL   Protein, ur 30 (*) NEGATIVE mg/dL   Urobilinogen, UA 1.0  0.0 - 1.0 mg/dL   Nitrite NEGATIVE  NEGATIVE   Leukocytes, UA TRACE (*) NEGATIVE  URINE MICROSCOPIC-ADD ON     Status: None   Collection Time    03/23/13  2:35 PM      Result Value Range   Squamous Epithelial / LPF RARE  RARE   WBC, UA 0-2  <3 WBC/hpf   RBC / HPF TOO NUMEROUS TO COUNT  <3 RBC/hpf   Urine-Other MUCOUS PRESENT    POCT PREGNANCY, URINE     Status: None   Collection Time    03/23/13  2:54 PM      Result Value Range   Preg Test, Ur NEGATIVE  NEGATIVE  CBC     Status: None   Collection Time    03/23/13  3:05 PM      Result Value Range   WBC 6.8  4.0 - 10.5 K/uL   RBC 4.13  3.87 - 5.11 MIL/uL   Hemoglobin 12.2  12.0 - 15.0 g/dL   HCT 16.1  09.6 - 04.5 %   MCV 89.8  78.0 - 100.0 fL   MCH 29.5  26.0 - 34.0 pg   MCHC 32.9  30.0 - 36.0 g/dL   RDW 40.9  81.1 - 91.4 %   Platelets 211  150 - 400 K/uL       MAU Course  Procedures  MDM  Toradol 60mg  and zofran brought pain to 2/10  Assessment and Plan   A: Dysmenorrhea P: [facility] Mirena IUD to stop/ decrease menses pains Motrin OTC if BP is stable Pt has Norco/ Ultram at home for excessive pains  Carolynn Serve 03/23/2013, 2:44 PM

## 2013-03-24 NOTE — MAU Provider Note (Signed)
Attestation of Attending Supervision of Advanced Practitioner: Evaluation and management procedures were performed by the PA/NP/CNM/OB Fellow under my supervision/collaboration. Chart reviewed and agree with management and plan.  Eveleigh Crumpler V 03/24/2013 10:08 AM

## 2013-03-25 LAB — GC/CHLAMYDIA PROBE AMP
CT Probe RNA: NEGATIVE
GC Probe RNA: NEGATIVE

## 2013-03-31 ENCOUNTER — Emergency Department (HOSPITAL_COMMUNITY)
Admission: EM | Admit: 2013-03-31 | Discharge: 2013-04-01 | Disposition: A | Discharge status: Home / Self Care | Payer: No Typology Code available for payment source | Attending: Emergency Medicine | Admitting: Emergency Medicine

## 2013-03-31 ENCOUNTER — Encounter (HOSPITAL_COMMUNITY): Payer: Self-pay | Admitting: Emergency Medicine

## 2013-03-31 DIAGNOSIS — J45909 Unspecified asthma, uncomplicated: Secondary | ICD-10-CM | POA: Insufficient documentation

## 2013-03-31 DIAGNOSIS — R011 Cardiac murmur, unspecified: Secondary | ICD-10-CM | POA: Insufficient documentation

## 2013-03-31 DIAGNOSIS — E669 Obesity, unspecified: Secondary | ICD-10-CM | POA: Insufficient documentation

## 2013-03-31 DIAGNOSIS — F17200 Nicotine dependence, unspecified, uncomplicated: Secondary | ICD-10-CM | POA: Insufficient documentation

## 2013-03-31 DIAGNOSIS — L089 Local infection of the skin and subcutaneous tissue, unspecified: Secondary | ICD-10-CM | POA: Insufficient documentation

## 2013-03-31 DIAGNOSIS — I10 Essential (primary) hypertension: Secondary | ICD-10-CM | POA: Insufficient documentation

## 2013-03-31 NOTE — ED Notes (Signed)
Unable to locate pt to take to exam room 

## 2013-03-31 NOTE — ED Notes (Signed)
No answer from pt when called in waiting room.

## 2013-03-31 NOTE — ED Notes (Signed)
Per pt sts LLE infection. sts hx of cellulitis.

## 2013-04-01 NOTE — ED Notes (Signed)
Multiple calls for pt without response.

## 2013-04-03 ENCOUNTER — Encounter: Payer: Self-pay | Admitting: Internal Medicine

## 2013-04-03 ENCOUNTER — Ambulatory Visit: Payer: No Typology Code available for payment source | Attending: Internal Medicine | Admitting: Internal Medicine

## 2013-04-03 VITALS — BP 129/87 | HR 91 | Temp 99.3°F | Resp 18 | Ht 64.0 in | Wt 365.0 lb

## 2013-04-03 DIAGNOSIS — L259 Unspecified contact dermatitis, unspecified cause: Secondary | ICD-10-CM

## 2013-04-03 DIAGNOSIS — M7989 Other specified soft tissue disorders: Secondary | ICD-10-CM | POA: Insufficient documentation

## 2013-04-03 DIAGNOSIS — E669 Obesity, unspecified: Secondary | ICD-10-CM

## 2013-04-03 DIAGNOSIS — R609 Edema, unspecified: Secondary | ICD-10-CM

## 2013-04-03 MED ORDER — MONTELUKAST SODIUM 10 MG PO TABS: 10.0000 mg | ORAL_TABLET | Freq: Every day | ORAL | Status: DC

## 2013-04-03 MED ORDER — HYDROCHLOROTHIAZIDE 25 MG PO TABS: 12.5000 mg | ORAL_TABLET | Freq: Every day | ORAL | Status: DC

## 2013-04-03 MED ORDER — BUDESONIDE-FORMOTEROL FUMARATE 160-4.5 MCG/ACT IN AERO: 2.0000 | INHALATION_SPRAY | Freq: Two times a day (BID) | RESPIRATORY_TRACT | Status: DC | PRN

## 2013-04-03 MED ORDER — TRIAMCINOLONE ACETONIDE 0.1 % EX CREA: TOPICAL_CREAM | Freq: Two times a day (BID) | CUTANEOUS | Status: DC

## 2013-04-03 MED ORDER — ALBUTEROL SULFATE HFA 108 (90 BASE) MCG/ACT IN AERS: 2.0000 | INHALATION_SPRAY | Freq: Four times a day (QID) | RESPIRATORY_TRACT | Status: DC | PRN

## 2013-04-03 MED ORDER — POTASSIUM CHLORIDE CRYS ER 20 MEQ PO TBCR: 10.0000 meq | EXTENDED_RELEASE_TABLET | Freq: Every day | ORAL | Status: DC

## 2013-04-03 NOTE — Progress Notes (Signed)
Patient Demographics  Marissa Joseph, is a 32 y.o. female  FAO:[id number]  ONG:[id number]  DOB - 1981/03/31  Chief Complaint  Patient presents with  . Follow-up  . Recurrent Skin Infections  . Medication Refill  . Asthma        Subjective:   Marissa Joseph today is here for a follow up visit. Patient thinks that she may be developing cellulitis of her left leg, she has no fever. She claims that she developed a small knot on the left leg a few days ago, however today it has almost resolved. She denies any erythema, or swelling of the leg. She has chronic venous stasis changes. She has no other complaints, she ran out of her a hydrochlorothiazide and is currently taking her mother's pills.   Patient has No headache, No chest pain, No abdominal pain - No Nausea, No new weakness tingling or numbness, No Cough - SOB.   Objective:    Filed Vitals:   04/03/13 1559  BP: 129/87  Pulse: 91  Temp: 99.3 F (37.4 C)  TempSrc: Oral  Resp: 18  Height: 5\' 4"  (1.626 m)  Weight: 365 lb (165.563 kg)  SpO2: 96%     ALLERGIES:   Allergies  Allergen Reactions  . Shellfish Allergy Hives, Shortness Of Breath and Swelling  . Avocado     Throat itching and swelling    PAST MEDICAL HISTORY: Past Medical History  Diagnosis Date  . Asthma   . Fibroids   . Hypertension   . Obesity   . Heart murmur   . Eczema   . MRSA (methicillin resistant staph aureus) culture positive 05/2012    cellulits/ left leg  . Anxiety     no official dx    MEDICATIONS AT HOME: Prior to Admission medications   Medication Sig Start Date End Date Taking? Authorizing Provider  budesonide-formoterol (SYMBICORT) 160-4.5 MCG/ACT inhaler Inhale 2 puffs into the lungs 2 (two) times daily as needed (shortness of breath). 04/03/13  Yes Shanker Levora Dredge, MD  montelukast (SINGULAIR) 10 MG tablet Take 1 tablet (10 mg total) by mouth at bedtime. 04/03/13  Yes Shanker Levora Dredge, MD  albuterol (PROVENTIL  HFA;VENTOLIN HFA) 108 (90 BASE) MCG/ACT inhaler Inhale 2 puffs into the lungs every 6 (six) hours as needed for wheezing. 04/03/13   Shanker Levora Dredge, MD  hydrochlorothiazide (HYDRODIURIL) 25 MG tablet Take 0.5 tablets (12.5 mg total) by mouth daily. 04/03/13   Shanker Levora Dredge, MD  ibuprofen (ADVIL,MOTRIN) 200 MG tablet Take 800 mg by mouth every 6 (six) hours as needed for pain.    Historical Provider, MD  potassium chloride SA (K-DUR,KLOR-CON) 20 MEQ tablet Take 0.5 tablets (10 mEq total) by mouth daily. 04/03/13   Shanker Levora Dredge, MD  traMADol (ULTRAM) 50 MG tablet Take 50 mg by mouth every 6 (six) hours as needed for pain.    Historical Provider, MD  triamcinolone cream (KENALOG) 0.1 % Apply topically 2 (two) times daily. 04/03/13   Shanker Levora Dredge, MD     Exam  General appearance :Awake, alert, not in any distress. Speech Clear. Not toxic Looking HEENT: Atraumatic and Normocephalic, pupils equally reactive to light and accomodation Neck: supple, no JVD. No cervical lymphadenopathy.  Chest:Good air entry bilaterally, no added sounds  CVS: S1 S2 regular, no murmurs.  Abdomen: Bowel sounds present, Non tender and not distended with no gaurding, rigidity or rebound. Extremities: B/L Lower Ext shows non pitting edema, both legs are warm to touch. Both lower  extremities as evidence of chronic venous stasis dermatitis changes, the left lower extremity in particular it does not show any evidence of acute cellulitis. Neurology: Awake alert, and oriented X 3, CN II-XII intact, Non focal Skin:No Rash Wounds:N/A    Data Review   CBC No results found for this basename: WBC, HGB, HCT, PLT, MCV, MCH, MCHC, RDW, NEUTRABS, LYMPHSABS, MONOABS, EOSABS, BASOSABS, BANDABS, BANDSABD,  in the last 168 hours  Chemistries   No results found for this basename: NA, K, CL, CO2, GLUCOSE, BUN, CREATININE, GFRCGP, CALCIUM, MG, AST, ALT, ALKPHOS, BILITOT,  in the last 168  hours ------------------------------------------------------------------------------------------------------------------ No results found for this basename: HGBA1C,  in the last 72 hours ------------------------------------------------------------------------------------------------------------------ No results found for this basename: CHOL, HDL, LDLCALC, TRIG, CHOLHDL, LDLDIRECT,  in the last 72 hours ------------------------------------------------------------------------------------------------------------------ No results found for this basename: TSH, T4TOTAL, FREET3, T3FREE, THYROIDAB,  in the last 72 hours ------------------------------------------------------------------------------------------------------------------ No results found for this basename: VITAMINB12, FOLATE, FERRITIN, TIBC, IRON, RETICCTPCT,  in the last 72 hours  Coagulation profile  No results found for this basename: INR, PROTIME,  in the last 168 hours    Assessment & Plan   ? Cellulitis of left lower extremity  - Clinically see no evidence of left leg colitis at present, all of her current changes are chronic - Monitor off antibiotics, I have asked the patient to give [place] a call if she notices any new changes in her legs  Chronic bilateral lower extremity swelling/chronic venous stasis changes  - Continue with f low-dose hydrochlorothiazide along with potassium supplementation. Continue to use bilateral lower extremity compression stockings.  - Counsel regarding the importance of weight loss. She understands and seems to be motivated about exercise.   Bronchial asthma  - Lungs clear  - This is stable at this time   Morbid obesity  - See above regarding counseling   Anemia  - Hemoglobin significantly improved, recheck periodically. Hemoglobin on 03/24/1411.2.  Health Maintenance -Pap Smear: Refer to GYN   Follow up in 3 months   The patient was given clear instructions to go to ER or return to  medical center if symptoms don't improve, worsen or new problems develop. The patient verbalized understanding. The patient was told to call to get lab results if they haven't heard anything in the next week.

## 2013-04-03 NOTE — Progress Notes (Signed)
Pt here to f/u medication refill Albuterol inhaler,eczema Flare up cellulitis left leg without oozing

## 2013-05-22 ENCOUNTER — Encounter: Payer: Self-pay | Admitting: Obstetrics & Gynecology

## 2013-05-22 ENCOUNTER — Ambulatory Visit (INDEPENDENT_AMBULATORY_CARE_PROVIDER_SITE_OTHER): Payer: No Typology Code available for payment source | Admitting: Obstetrics & Gynecology

## 2013-05-22 VITALS — BP 132/85 | HR 84 | Temp 97.2°F | Ht 64.0 in | Wt 353.6 lb

## 2013-05-22 DIAGNOSIS — D259 Leiomyoma of uterus, unspecified: Secondary | ICD-10-CM | POA: Insufficient documentation

## 2013-05-22 DIAGNOSIS — Z01419 Encounter for gynecological examination (general) (routine) without abnormal findings: Secondary | ICD-10-CM

## 2013-05-22 DIAGNOSIS — N946 Dysmenorrhea, unspecified: Secondary | ICD-10-CM | POA: Insufficient documentation

## 2013-05-22 NOTE — Progress Notes (Signed)
Patient ID: Marissa Joseph, female   DOB: 1980/09/28, 32 y.o.   MRN: [id number]  Chief Complaint  Patient presents with  . Gynecologic Exam  . Pelvic Pain    HPI Marissa Joseph is a 32 y.o. female. G0P0 Patient's last menstrual period was 05/11/2013. Dysmenorrhea. Menses last 4-6 days, h/o fibroids on [place] 2011   HPI  Past Medical History  Diagnosis Date  . Asthma   . Fibroids   . Hypertension   . Obesity   . Heart murmur   . Eczema   . MRSA (methicillin resistant staph aureus) culture positive 05/2012    cellulits/ left leg  . Anxiety     no official dx    Past Surgical History  Procedure Laterality Date  . No past surgeries      Family History  Problem Relation Age of Onset  . Diabetes Father   . Diabetes Mother   . Cancer Maternal Uncle     prostate  . Cancer Paternal Aunt     lung  . Heart disease Maternal Grandmother   . Diabetes Maternal Grandmother   . Stroke Maternal Grandfather     Social History History  Substance Use Topics  . Smoking status: Current Every Day Smoker -- 0.25 packs/day for 1 years    Types: Cigarettes  . Smokeless tobacco: Never Used  . Alcohol Use: Yes     Comment: occ    Allergies  Allergen Reactions  . Shellfish Allergy Hives, Shortness Of Breath and Swelling  . Avocado     Throat itching and swelling    Current Outpatient Prescriptions  Medication Sig Dispense Refill  . albuterol (PROVENTIL HFA;VENTOLIN HFA) 108 (90 BASE) MCG/ACT inhaler Inhale 2 puffs into the lungs every 6 (six) hours as needed for wheezing.  1 Inhaler  2  . budesonide-formoterol (SYMBICORT) 160-4.5 MCG/ACT inhaler Inhale 2 puffs into the lungs 2 (two) times daily as needed (shortness of breath).  1 Inhaler  2  . hydrochlorothiazide (HYDRODIURIL) 25 MG tablet Take 0.5 tablets (12.5 mg total) by mouth daily.  30 tablet  3  . ibuprofen (ADVIL,MOTRIN) 200 MG tablet Take 800 mg by mouth every 6 (six) hours as needed for pain.      . montelukast  (SINGULAIR) 10 MG tablet Take 1 tablet (10 mg total) by mouth at bedtime.  30 tablet  2  . traMADol (ULTRAM) 50 MG tablet Take 50 mg by mouth every 6 (six) hours as needed for pain.      Marland Kitchen triamcinolone cream (KENALOG) 0.1 % Apply topically 2 (two) times daily.  30 g  1  . potassium chloride SA (K-DUR,KLOR-CON) 20 MEQ tablet Take 0.5 tablets (10 mEq total) by mouth daily.  30 tablet  2   No current facility-administered medications for this visit.    Review of Systems Review of Systems  Constitutional: Negative for fever and fatigue.  Genitourinary: Positive for menstrual problem and pelvic pain. Negative for vaginal bleeding and vaginal discharge.    Blood pressure 132/85, pulse 84, temperature 97.2 F (36.2 C), height 5\' 4"  (1.626 m), weight 353 lb 9.6 oz (160.392 kg), last menstrual period 05/11/2013.  Physical Exam Physical Exam  Constitutional: She is oriented to person, place, and time. No distress.  obese  Pulmonary/Chest: Effort normal. No respiratory distress.  Large breasts no masses  Abdominal: Soft. She exhibits no mass. There is no tenderness.  Genitourinary: Vagina normal and uterus normal. No vaginal discharge found.  Pap done, limited  by obesity  Neurological: She is alert and oriented to person, place, and time.  Skin: Skin is warm and dry.  Psychiatric: She has a normal mood and affect. Her behavior is normal.    Data Reviewed Clinical Data: Pelvic pain, follow-up fibroids  TRANSABDOMINAL AND TRANSVAGINAL ULTRASOUND OF PELVIS  Technique: Both transabdominal and transvaginal ultrasound  examinations of the pelvis were performed including evaluation of  the uterus, ovaries, adnexal regions, and pelvic cul-de-sac.  Transabdominal technique was performed for global imaging of the  pelvis. Transvaginal technique was performed for detailed  evaluation of the endometrium and/or ovaries.  Comparison: 11/27/2007  Findings:  Uterus 8.6 x 8.3 x 7.0 cm. Anteverted,  anteflexed. Multiple  fibroids as follows:  Anterior right uterine body/fundus, predominately intramural, with  a small submucosal component, 4.6 x 4.4 x 4.1 cm  Left lateral uterine body, intramural / partly subserosal, 3.5 x  3.4 x 2.9 cm.  Left anterior uterine body, intramural, 2.0 x 2.0 x 1.4 cm.  Fibroids are not similarly measured on the prior exam, on which a  subserosal fibroid was identified which is not seen on today's  exam, although the visualized fibroids today appear more prominent  subjectively.  Endometrium 0.3 cm, uniformly echogenic.  Right Ovary 3.1 x 1.9 x 1.8 cm. Normal.  Left Ovary not visualized. No adnexal mass.  Other Findings: No free fluid.  IMPRESSION:  Fibroid uterus as above.  Provider: Joice Lofts Ward        Assessment    Fibroid, dysmenorrhea     Plan    [place], RTC, consider Mirena        Tyquavious Gamel 05/22/2013, 2:12 PM

## 2013-05-22 NOTE — Progress Notes (Signed)
Referred for annual pap smear. Also c/o pain during cycle - known fibroids.

## 2013-05-22 NOTE — Patient Instructions (Signed)
Uterine Fibroid °A uterine fibroid is a growth (tumor) that occurs in a woman's uterus. This type of tumor is not cancerous and does not spread out of the uterus. A woman can have one or many fibroids, and the fiboid(s) can become quite large. A fibroid can vary in size, weight, and where it grows in the uterus. Most fibroids do not require medical treatment, but some can cause pain or heavy bleeding during and between periods. °CAUSES  °A fibroid is the result of a single uterine cell that keeps growing (unregulated), which is different than most cells in the human body. Most cells have a control mechanism that keeps them from reproducing without control.  °SYMPTOMS  °· Bleeding. °· Pelvic pain and pressure. °· Bladder problems due to the size of the fibroid. °· Infertility and miscarriages depending on the size and location of the fibroid. °DIAGNOSIS  °A diagnosis is made by physical exam. Your caregiver may feel the lumpy tumors during a pelvic exam. Important information regarding size, location, and number of tumors can be gained by having an ultrasound. It is rare that other tests, such as a CT scan or MRI, are needed. °TREATMENT  °· Your caregiver may recommend watchful waiting. This involves getting the fibroid checked by your caregiver to see if the fibroids grow or shrink.   °· Hormonal treatment or an intrauterine device (IUD) may be prescribed.   °· Surgery may be needed to remove the fibroids (myomectomy) or the uterus (hysterectomy). This depends on your situation. °When fibroids interfere with fertility and a woman wants to become pregnant, a caregiver may recommend having the fibroids removed.  °HOME CARE INSTRUCTIONS  °Home care depends on how you were treated. In general:  °· Keep all follow-up appointments with your caregiver.   °· Only take medicine as told by your caregiver. Do not take aspirin. It can cause bleeding.   °· If you have excessive periods and soak tampons or pads in a half hour or  less, contact your caregiver immediately. If your periods are troublesome but not so heavy, lie down with your feet raised slightly above your heart. Place cold packs on your lower abdomen.   °· If your periods are heavy, write down the number of pads or tampons you use per month. Bring this information to your caregiver.   °· Talk to your caregiver about taking iron pills.    °· Include green vegetables in your diet.   °· If you were prescribed a hormonal treatment, take the hormonal medicines as directed.   °· If you need surgery, ask your caregiver for information on your specific surgery.   °SEEK IMMEDIATE MEDICAL CARE IF: °· You have pelvic pain or cramps not controlled with medicines.   °· You have a sudden increase in pelvic pain.   °· You have an increase of bleeding between and during periods.   °· You feel lightheaded or have fainting episodes.   °MAKE SURE YOU: °· Understand these instructions. °· Will watch your condition. °· Will get help right away if you are not doing well or get worse. °Document Released: 05/19/2000 Document Revised: 08/14/2011 Document Reviewed: 12/19/2012 °[employer]® Patient Information ©2014 [employer]. ° °

## 2013-05-28 ENCOUNTER — Other Ambulatory Visit: Payer: Self-pay | Admitting: Internal Medicine

## 2013-05-28 ENCOUNTER — Telehealth: Payer: Self-pay

## 2013-05-28 MED ORDER — BUDESONIDE-FORMOTEROL FUMARATE 160-4.5 MCG/ACT IN AERO: 2.0000 | INHALATION_SPRAY | Freq: Two times a day (BID) | RESPIRATORY_TRACT | Status: DC | PRN

## 2013-05-28 NOTE — Telephone Encounter (Signed)
Called pt. And informed her of abnormal pap results and need for colposcopy. Informed her of appointment 1/21 at 1245. Pt. Verbalized understanding and had no other questions or concerns.

## 2013-05-28 NOTE — Telephone Encounter (Signed)
Message copied by Louanna Raw on Wed May 28, 2013  8:42 AM ------      Message from: Odelia Gage A      Created: Wed May 28, 2013  7:02 AM       Patient has an appointment on 01/21 @ 12:45. I just made it a colposcopy  Appointment.                  ----- Message -----         From: Drucilla Schmidt Day, RN         Sent: 05/27/2013   4:06 PM           To: [facility]            Please schedule appt then send back to clinical pool. We will call the pt.  Thanks!       ----- Message -----         From: Adam Phenix, MD         Sent: 05/27/2013   1:39 PM           To: [facility], [facility]            Schedule colposcopy             ------

## 2013-06-16 ENCOUNTER — Emergency Department (HOSPITAL_COMMUNITY)
Admission: EM | Admit: 2013-06-16 | Discharge: 2013-06-16 | Disposition: A | Discharge status: Home / Self Care | Payer: Self-pay | Attending: Emergency Medicine | Admitting: Emergency Medicine

## 2013-06-16 ENCOUNTER — Encounter (HOSPITAL_COMMUNITY): Payer: Self-pay | Admitting: Emergency Medicine

## 2013-06-16 ENCOUNTER — Emergency Department (HOSPITAL_COMMUNITY): Payer: Self-pay

## 2013-06-16 DIAGNOSIS — E669 Obesity, unspecified: Secondary | ICD-10-CM | POA: Insufficient documentation

## 2013-06-16 DIAGNOSIS — IMO0002 Reserved for concepts with insufficient information to code with codable children: Secondary | ICD-10-CM | POA: Insufficient documentation

## 2013-06-16 DIAGNOSIS — M25569 Pain in unspecified knee: Secondary | ICD-10-CM | POA: Insufficient documentation

## 2013-06-16 DIAGNOSIS — Z79899 Other long term (current) drug therapy: Secondary | ICD-10-CM | POA: Insufficient documentation

## 2013-06-16 DIAGNOSIS — R011 Cardiac murmur, unspecified: Secondary | ICD-10-CM | POA: Insufficient documentation

## 2013-06-16 DIAGNOSIS — M25469 Effusion, unspecified knee: Secondary | ICD-10-CM | POA: Insufficient documentation

## 2013-06-16 DIAGNOSIS — M25462 Effusion, left knee: Secondary | ICD-10-CM

## 2013-06-16 DIAGNOSIS — I10 Essential (primary) hypertension: Secondary | ICD-10-CM | POA: Insufficient documentation

## 2013-06-16 DIAGNOSIS — Z8742 Personal history of other diseases of the female genital tract: Secondary | ICD-10-CM | POA: Insufficient documentation

## 2013-06-16 DIAGNOSIS — J45909 Unspecified asthma, uncomplicated: Secondary | ICD-10-CM | POA: Insufficient documentation

## 2013-06-16 DIAGNOSIS — M25562 Pain in left knee: Secondary | ICD-10-CM

## 2013-06-16 DIAGNOSIS — F17200 Nicotine dependence, unspecified, uncomplicated: Secondary | ICD-10-CM | POA: Insufficient documentation

## 2013-06-16 DIAGNOSIS — Z8614 Personal history of Methicillin resistant Staphylococcus aureus infection: Secondary | ICD-10-CM | POA: Insufficient documentation

## 2013-06-16 DIAGNOSIS — F411 Generalized anxiety disorder: Secondary | ICD-10-CM | POA: Insufficient documentation

## 2013-06-16 DIAGNOSIS — Z872 Personal history of diseases of the skin and subcutaneous tissue: Secondary | ICD-10-CM | POA: Insufficient documentation

## 2013-06-16 MED ORDER — IBUPROFEN 800 MG PO TABS: 800.0000 mg | ORAL_TABLET | Freq: Three times a day (TID) | ORAL | Status: DC

## 2013-06-16 MED ORDER — HYDROCODONE-ACETAMINOPHEN 5-325 MG PO TABS: 1.0000 | ORAL_TABLET | ORAL | Status: DC | PRN

## 2013-06-16 NOTE — ED Provider Notes (Signed)
CSN: [id number]     Arrival date & time 06/16/13  1743 History  This chart was scribed for non-physician practitioner Harvie Heck, PA-C, working with Virgel Manifold, MD by Vernell Barrier, ED scribe. This patient was seen in room TR06C/TR06C and the patient's care was started at 8:25 PM.    Chief Complaint  Patient presents with  . Knee Pain   The history is provided by the patient. No language interpreter was used.  HPI Comments: Marissa Joseph is a 33 y.o. female who presents to the Emergency Department complaining of constant left knee pain w/ intermittent swelling. Pt states she has been having gradually worsening pain in her left foot in the last few hours. Pt is on HCTZ and states she first noticed swelling after she had taken medication. Pt denies any injury or fall related to left knee pain. Pt states she took a Tramadol this morning without relief. Also tried ice with no relief. Pt has had cellulitis in the left leg before. Pt is allergic to shellfish and avocado. Denies cancer, DVT, blood clot or family hx of. Denies any recent travel. Denies fever.    Past Medical History  Diagnosis Date  . Asthma   . Fibroids   . Hypertension   . Obesity   . Heart murmur   . Eczema   . MRSA (methicillin resistant staph aureus) culture positive 05/2012    cellulits/ left leg  . Anxiety     no official dx   Past Surgical History  Procedure Laterality Date  . No past surgeries     Family History  Problem Relation Age of Onset  . Diabetes Father   . Diabetes Mother   . Cancer Maternal Uncle     prostate  . Cancer Paternal Aunt     lung  . Heart disease Maternal Grandmother   . Diabetes Maternal Grandmother   . Stroke Maternal Grandfather    History  Substance Use Topics  . Smoking status: Current Every Day Smoker -- 0.25 packs/day for 1 years    Types: Cigarettes  . Smokeless tobacco: Never Used  . Alcohol Use: Yes     Comment: occ   OB History   Grav Para Term Preterm  Abortions TAB SAB Ect Mult Living   0              Review of Systems  Constitutional: Negative for fever and chills.  Musculoskeletal: Positive for arthralgias, gait problem and joint swelling.  Skin: Negative for rash.  Neurological: Negative for weakness and numbness.  All other systems reviewed and are negative.   Allergies  Shellfish allergy and Avocado  Home Medications   Current Outpatient Rx  Name  Route  Sig  Dispense  Refill  . albuterol (PROVENTIL HFA;VENTOLIN HFA) 108 (90 BASE) MCG/ACT inhaler   Inhalation   Inhale 2 puffs into the lungs every 6 (six) hours as needed for wheezing.   1 Inhaler   2   . budesonide-formoterol (SYMBICORT) 160-4.5 MCG/ACT inhaler   Inhalation   Inhale 2 puffs into the lungs 2 (two) times daily as needed (shortness of breath).   3 Inhaler   1 year   . hydrochlorothiazide (HYDRODIURIL) 25 MG tablet   Oral   Take 25 mg by mouth daily.         Marland Kitchen ibuprofen (ADVIL,MOTRIN) 200 MG tablet   Oral   Take 800 mg by mouth every 6 (six) hours as needed for pain.         Marland Kitchen  montelukast (SINGULAIR) 10 MG tablet   Oral   Take 1 tablet (10 mg total) by mouth at bedtime.   30 tablet   2   . potassium chloride SA (K-DUR,KLOR-CON) 20 MEQ tablet   Oral   Take 0.5 tablets (10 mEq total) by mouth daily.   30 tablet   2   . traMADol (ULTRAM) 50 MG tablet   Oral   Take 50 mg by mouth every 6 (six) hours as needed for pain.         Marland Kitchen triamcinolone cream (KENALOG) 0.1 %   Topical   Apply topically 2 (two) times daily.   30 g   1   . HYDROcodone-acetaminophen (NORCO/VICODIN) 5-325 MG per tablet   Oral   Take 1 tablet by mouth every 4 (four) hours as needed. Take with food   15 tablet   0   . ibuprofen (ADVIL,MOTRIN) 800 MG tablet   Oral   Take 1 tablet (800 mg total) by mouth 3 (three) times daily. Take with food.   21 tablet   0    Triage Vitals: BP 137/96  Pulse 83  Temp(Src) 97.9 F (36.6 C) (Oral)  Resp 18  SpO2 97%   LMP 06/06/2013 Physical Exam  Nursing note and vitals reviewed. Constitutional: She is oriented to person, place, and time. She appears well-developed and well-nourished. No distress.  HENT:  Head: Normocephalic and atraumatic.  Eyes: EOM are normal.  Neck: Neck supple. No tracheal deviation present.  Cardiovascular: Normal rate, regular rhythm and normal heart sounds.   Pulmonary/Chest: Effort normal and breath sounds normal. No respiratory distress. She has no wheezes. She has no rales.  Musculoskeletal:       Left knee: She exhibits normal range of motion, no ecchymosis, no deformity, no erythema, normal alignment, no LCL laxity, normal patellar mobility and no MCL laxity. Tenderness found. Medial joint line tenderness noted.  Unable to appreciate swelling due to patient's body habitus. No increase in temperature to palpation.  Neurological: She is alert and oriented to person, place, and time.  Skin: Skin is warm and dry. No rash noted.  Psychiatric: She has a normal mood and affect. Her behavior is normal.   ED Course  Procedures  DIAGNOSTIC STUDIES: Oxygen Saturation is 97% on room air, normal by my interpretation.    COORDINATION OF CARE: At 8:25 PM: Discussed treatment plan with patient which includes left knee x-ray. Patient agrees.   Labs Review Labs Reviewed - No data to display Imaging Review US Transvaginal Non-ob  06/17/2013   CLINICAL DATA:  Dysmenorrhea. Fibroids. Pelvic pain. LMP 06/06/2013.  EXAM: TRANSABDOMINAL AND TRANSVAGINAL ULTRASOUND OF PELVIS  TECHNIQUE: Both transabdominal and transvaginal ultrasound examinations of the pelvis were performed. Transabdominal technique was performed for global imaging of the pelvis including uterus, ovaries, adnexal regions, and pelvic cul-de-sac. It was necessary to proceed with endovaginal exam following the transabdominal exam to visualize the endometrium and ovaries.  COMPARISON:  02/11/2010  FINDINGS: Uterus  Measurements:  14.2 x 7.7 x 8.5 cm. Multiple fibroids are seen. The largest fibroid is subserosal in location in the posterior fundus measuring 7.2 cm. Two other fibroids in the right and left uterine corpus measure 4.8 cm and 4.6 cm respectively. A smaller fibroid measuring 1.7 cm is seen in the left anterior lower uterine segment.  Endometrium  Thickness: 16 mm.  No focal abnormality visualized.  Right ovary  Measurements: Not directly visualized by transabdominal or transvaginal sonography, however no adnexal mass  identified.  Left ovary  Measurements: Not directly visualized by transabdominal or transvaginal sonography, however no adnexal mass identified.  Other findings  No free fluid.  IMPRESSION: Increased size of myomatous uterus, with largest fibroid now measuring 7.2 cm.  Nonvisualization of ovaries, however no adnexal mass identified.   Electronically Signed   By: Earle Gell M.D.   On: 06/17/2013 17:13   US Pelvis Complete  06/17/2013   CLINICAL DATA:  Dysmenorrhea. Fibroids. Pelvic pain. LMP 06/06/2013.  EXAM: TRANSABDOMINAL AND TRANSVAGINAL ULTRASOUND OF PELVIS  TECHNIQUE: Both transabdominal and transvaginal ultrasound examinations of the pelvis were performed. Transabdominal technique was performed for global imaging of the pelvis including uterus, ovaries, adnexal regions, and pelvic cul-de-sac. It was necessary to proceed with endovaginal exam following the transabdominal exam to visualize the endometrium and ovaries.  COMPARISON:  02/11/2010  FINDINGS: Uterus  Measurements: 14.2 x 7.7 x 8.5 cm. Multiple fibroids are seen. The largest fibroid is subserosal in location in the posterior fundus measuring 7.2 cm. Two other fibroids in the right and left uterine corpus measure 4.8 cm and 4.6 cm respectively. A smaller fibroid measuring 1.7 cm is seen in the left anterior lower uterine segment.  Endometrium  Thickness: 16 mm.  No focal abnormality visualized.  Right ovary  Measurements: Not directly visualized by  transabdominal or transvaginal sonography, however no adnexal mass identified.  Left ovary  Measurements: Not directly visualized by transabdominal or transvaginal sonography, however no adnexal mass identified.  Other findings  No free fluid.  IMPRESSION: Increased size of myomatous uterus, with largest fibroid now measuring 7.2 cm.  Nonvisualization of ovaries, however no adnexal mass identified.   Electronically Signed   By: Earle Gell M.D.   On: 06/17/2013 17:13    EKG Interpretation   None       MDM   1. Left knee pain   2. Knee swelling, left    Pt reports non-traumatic right knee pain.  XR shows moderate sized joint effusion but due to patient's body habitus swelling is not identifiable on PE. Discussed imaging results, and treatment plan with the patient. Return precautions given. Reports understanding and no other concerns at this time.  Patient is stable for discharge at this time.  Meds given in ED:  Medications - No data to display  Discharge Medication List as of 06/16/2013  9:41 PM    START taking these medications   Details  HYDROcodone-acetaminophen (NORCO/VICODIN) 5-325 MG per tablet Take 1 tablet by mouth every 4 (four) hours as needed. Take with food, Starting 06/16/2013, Until Discontinued, Print    !! ibuprofen (ADVIL,MOTRIN) 800 MG tablet Take 1 tablet (800 mg total) by mouth 3 (three) times daily. Take with food., Starting 06/16/2013, Until Discontinued, Print     !! - Potential duplicate medications found. Please discuss with provider.       I personally performed the services described in this documentation, which was scribed in my presence. The recorded information has been reviewed and is accurate.       Lorrine Kin, PA-C 06/18/13 2215

## 2013-06-16 NOTE — ED Notes (Signed)
PT comfortable with d/c and f/u instructions. Prescriptions x2

## 2013-06-16 NOTE — ED Notes (Signed)
PA at bedside.

## 2013-06-16 NOTE — ED Notes (Signed)
Pt reports left knee swelling for last couple of days and hurts to bend

## 2013-06-16 NOTE — Discharge Instructions (Signed)
Call for a follow up appointment with a Family or Primary Care Provider.  °Return if Symptoms worsen.   °Take medication as prescribed.  ° °

## 2013-06-17 ENCOUNTER — Ambulatory Visit (HOSPITAL_COMMUNITY)
Admission: RE | Admit: 2013-06-17 | Discharge: 2013-06-17 | Disposition: A | Discharge status: Home / Self Care | Payer: Self-pay | Source: Ambulatory Visit | Attending: Obstetrics & Gynecology | Admitting: Obstetrics & Gynecology

## 2013-06-17 DIAGNOSIS — N946 Dysmenorrhea, unspecified: Secondary | ICD-10-CM | POA: Insufficient documentation

## 2013-06-17 DIAGNOSIS — N949 Unspecified condition associated with female genital organs and menstrual cycle: Secondary | ICD-10-CM | POA: Insufficient documentation

## 2013-06-17 DIAGNOSIS — D259 Leiomyoma of uterus, unspecified: Secondary | ICD-10-CM | POA: Insufficient documentation

## 2013-06-20 DIAGNOSIS — R011 Cardiac murmur, unspecified: Secondary | ICD-10-CM | POA: Insufficient documentation

## 2013-06-20 DIAGNOSIS — L03119 Cellulitis of unspecified part of limb: Principal | ICD-10-CM

## 2013-06-20 DIAGNOSIS — L02419 Cutaneous abscess of limb, unspecified: Secondary | ICD-10-CM | POA: Insufficient documentation

## 2013-06-20 DIAGNOSIS — J45909 Unspecified asthma, uncomplicated: Secondary | ICD-10-CM | POA: Insufficient documentation

## 2013-06-20 DIAGNOSIS — I10 Essential (primary) hypertension: Secondary | ICD-10-CM | POA: Insufficient documentation

## 2013-06-20 DIAGNOSIS — Z8742 Personal history of other diseases of the female genital tract: Secondary | ICD-10-CM | POA: Insufficient documentation

## 2013-06-20 DIAGNOSIS — F411 Generalized anxiety disorder: Secondary | ICD-10-CM | POA: Insufficient documentation

## 2013-06-20 DIAGNOSIS — Z8614 Personal history of Methicillin resistant Staphylococcus aureus infection: Secondary | ICD-10-CM | POA: Insufficient documentation

## 2013-06-20 DIAGNOSIS — E669 Obesity, unspecified: Secondary | ICD-10-CM | POA: Insufficient documentation

## 2013-06-20 DIAGNOSIS — R509 Fever, unspecified: Secondary | ICD-10-CM | POA: Insufficient documentation

## 2013-06-20 DIAGNOSIS — IMO0002 Reserved for concepts with insufficient information to code with codable children: Secondary | ICD-10-CM | POA: Insufficient documentation

## 2013-06-20 DIAGNOSIS — Z79899 Other long term (current) drug therapy: Secondary | ICD-10-CM | POA: Insufficient documentation

## 2013-06-20 DIAGNOSIS — Z792 Long term (current) use of antibiotics: Secondary | ICD-10-CM | POA: Insufficient documentation

## 2013-06-20 DIAGNOSIS — F17200 Nicotine dependence, unspecified, uncomplicated: Secondary | ICD-10-CM | POA: Insufficient documentation

## 2013-06-20 NOTE — ED Notes (Signed)
The pt is c/o lt leg pain all day with chills and fever.  She has had a cellulitis in that same leg recently and she thinks it has returned.  Unable to visualize the leg due to tight clothing.  The pt has been unable to see her leg ??? reason

## 2013-06-21 ENCOUNTER — Emergency Department (HOSPITAL_COMMUNITY)
Admission: EM | Admit: 2013-06-21 | Discharge: 2013-06-21 | Disposition: A | Discharge status: Home / Self Care | Payer: Self-pay | Attending: Emergency Medicine | Admitting: Emergency Medicine

## 2013-06-21 ENCOUNTER — Encounter (HOSPITAL_COMMUNITY): Payer: Self-pay | Admitting: Emergency Medicine

## 2013-06-21 ENCOUNTER — Emergency Department (HOSPITAL_COMMUNITY): Payer: Self-pay

## 2013-06-21 DIAGNOSIS — L03116 Cellulitis of left lower limb: Secondary | ICD-10-CM

## 2013-06-21 LAB — CBC
HCT: 40.1 % (ref 36.0–46.0)
Hemoglobin: 13.2 g/dL (ref 12.0–15.0)
MCH: 30.6 pg (ref 26.0–34.0)
MCHC: 32.9 g/dL (ref 30.0–36.0)
MCV: 92.8 fL (ref 78.0–100.0)
Platelets: 229 10*3/uL (ref 150–400)
RBC: 4.32 MIL/uL (ref 3.87–5.11)
RDW: 13.6 % (ref 11.5–15.5)
WBC: 15.7 10*3/uL — AB (ref 4.0–10.5)

## 2013-06-21 LAB — POCT I-STAT, CHEM 8
BUN: 13 mg/dL (ref 6–23)
CALCIUM ION: 1.18 mmol/L (ref 1.12–1.23)
Chloride: 103 mEq/L (ref 96–112)
Creatinine, Ser: 1 mg/dL (ref 0.50–1.10)
Glucose, Bld: 113 mg/dL — ABNORMAL HIGH (ref 70–99)
HEMATOCRIT: 44 % (ref 36.0–46.0)
Hemoglobin: 15 g/dL (ref 12.0–15.0)
Potassium: 3.7 mEq/L (ref 3.7–5.3)
Sodium: 140 mEq/L (ref 137–147)
TCO2: 24 mmol/L (ref 0–100)

## 2013-06-21 MED ORDER — OXYCODONE-ACETAMINOPHEN 5-325 MG PO TABS
2.0000 | ORAL_TABLET | Freq: Once | ORAL | Status: AC
  Administered 2013-06-21: 2 via ORAL
  Filled 2013-06-21: qty 2

## 2013-06-21 MED ORDER — VANCOMYCIN HCL IN DEXTROSE 1-5 GM/200ML-% IV SOLN
1000.0000 mg | Freq: Once | INTRAVENOUS | Status: AC
  Administered 2013-06-21: 1000 mg via INTRAVENOUS
  Filled 2013-06-21: qty 200

## 2013-06-21 MED ORDER — KETOROLAC TROMETHAMINE 60 MG/2ML IM SOLN
60.0000 mg | Freq: Once | INTRAMUSCULAR | Status: AC
  Administered 2013-06-21: 60 mg via INTRAMUSCULAR
  Filled 2013-06-21: qty 2

## 2013-06-21 MED ORDER — SODIUM CHLORIDE 0.9 % IV BOLUS (SEPSIS)
1000.0000 mL | Freq: Once | INTRAVENOUS | Status: AC
  Administered 2013-06-21: 1000 mL via INTRAVENOUS

## 2013-06-21 MED ORDER — CEPHALEXIN 500 MG PO CAPS: 500.0000 mg | ORAL_CAPSULE | Freq: Four times a day (QID) | ORAL | Status: DC

## 2013-06-21 MED ORDER — SULFAMETHOXAZOLE-TRIMETHOPRIM 800-160 MG PO TABS: 1.0000 | ORAL_TABLET | Freq: Two times a day (BID) | ORAL | Status: AC

## 2013-06-21 MED ORDER — HYDROCODONE-ACETAMINOPHEN 5-325 MG PO TABS: 1.0000 | ORAL_TABLET | Freq: Four times a day (QID) | ORAL | Status: DC | PRN

## 2013-06-21 NOTE — ED Provider Notes (Signed)
Medical screening examination/treatment/procedure(s) were performed by non-physician practitioner and as supervising physician I was immediately available for consultation/collaboration.  EKG Interpretation   None        Virgel Manifold, MD 06/21/13 2340

## 2013-06-21 NOTE — ED Provider Notes (Signed)
Medical screening examination/treatment/procedure(s) were conducted as a shared visit with non-physician practitioner(s) and myself.  I personally evaluated the patient during the encounter.  EKG Interpretation   None       Pt comes in with leg pain and redness. Hx of cellulitis with MRSA. No DM hx or immunocompromised status. Will give iv vanc here, and d.c with MSSA coverage - with strict return precautions discussed by Antonietta Breach.  Varney Biles, MD 06/21/13 [phone]

## 2013-06-21 NOTE — Discharge Instructions (Signed)
Take Bactrim and Keflex as prescribed. Take ibuprofen for pain control. You may take Norco as prescribed for breakthrough pain control as needed. Follow up with your primary care doctor. Return to the ED if symptoms worsen or if no improvement after taking oral antibiotics for 48 hours.  Cellulitis Cellulitis is an infection of the skin and the tissue beneath it. The infected area is usually red and tender. Cellulitis occurs most often in the arms and lower legs.  CAUSES  Cellulitis is caused by bacteria that enter the skin through cracks or cuts in the skin. The most common types of bacteria that cause cellulitis are Staphylococcus and Streptococcus. SYMPTOMS   Redness and warmth.  Swelling.  Tenderness or pain.  Fever. DIAGNOSIS  Your caregiver can usually determine what is wrong based on a physical exam. Blood tests may also be done. TREATMENT  Treatment usually involves taking an antibiotic medicine. HOME CARE INSTRUCTIONS   Take your antibiotics as directed. Finish them even if you start to feel better.  Keep the infected arm or leg elevated to reduce swelling.  Apply a warm cloth to the affected area up to 4 times per day to relieve pain.  Only take over-the-counter or prescription medicines for pain, discomfort, or fever as directed by your caregiver.  Keep all follow-up appointments as directed by your caregiver. SEEK MEDICAL CARE IF:   You notice red streaks coming from the infected area.  Your red area gets larger or turns dark in color.  Your bone or joint underneath the infected area becomes painful after the skin has healed.  Your infection returns in the same area or another area.  You notice a swollen bump in the infected area.  You develop new symptoms. SEEK IMMEDIATE MEDICAL CARE IF:   You have a fever.  You feel very sleepy.  You develop vomiting or diarrhea.  You have a general ill feeling (malaise) with muscle aches and pains. MAKE SURE YOU:    Understand these instructions.  Will watch your condition.  Will get help right away if you are not doing well or get worse. Document Released: 03/01/2005 Document Revised: 11/21/2011 Document Reviewed: 08/07/2011 [facility] Patient Information 2014 [place].

## 2013-06-21 NOTE — ED Provider Notes (Signed)
CSN: [id number]     Arrival date & time 06/20/13  2353 History   First MD Initiated Contact with Patient 06/21/13 0201     Chief Complaint  Patient presents with  . Leg Pain   (Consider location/radiation/quality/duration/timing/severity/associated sxs/prior Treatment) HPI Comments: 33 year old female with a history left lower extremity cellulitis (MRSA+), chronic venous stasis, hypertension, and obesity presents today for worsening left lower extremity pain. Patient states that pain has been throbbing in nature and nonradiating. Pain is worse with movement and palpation to the area. She states that symptoms have been associated with a subjective fever and intermittent chills. She states that symptoms feel similar to when she was diagnosed with cellulitis in the past. Patient denies any trauma or injury to her left lower leg as well as associated shortness of breath, syncope, numbness, weakness, vomiting, and pallor. Denies a hx of DVT or coagulopathies.  The history is provided by the patient. No language interpreter was used.    Past Medical History  Diagnosis Date  . Asthma   . Fibroids   . Hypertension   . Obesity   . Heart murmur   . Eczema   . MRSA (methicillin resistant staph aureus) culture positive 05/2012    cellulits/ left leg  . Anxiety     no official dx   Past Surgical History  Procedure Laterality Date  . No past surgeries     Family History  Problem Relation Age of Onset  . Diabetes Father   . Diabetes Mother   . Cancer Maternal Uncle     prostate  . Cancer Paternal Aunt     lung  . Heart disease Maternal Grandmother   . Diabetes Maternal Grandmother   . Stroke Maternal Grandfather    History  Substance Use Topics  . Smoking status: Current Every Day Smoker -- 0.25 packs/day for 1 years    Types: Cigarettes  . Smokeless tobacco: Never Used  . Alcohol Use: Yes     Comment: occ   OB History   Grav Para Term Preterm Abortions TAB SAB Ect Mult Living   0              Review of Systems  Constitutional: Positive for fever (subjective) and chills.  Respiratory: Negative for cough and shortness of breath.   Musculoskeletal: Positive for myalgias. Negative for gait problem.  Skin: Positive for color change. Negative for pallor.  Neurological: Negative for syncope, weakness and numbness.  All other systems reviewed and are negative.    Allergies  Shellfish allergy and Avocado  Home Medications   Current Outpatient Rx  Name  Route  Sig  Dispense  Refill  . albuterol (PROVENTIL HFA;VENTOLIN HFA) 108 (90 BASE) MCG/ACT inhaler   Inhalation   Inhale 2 puffs into the lungs every 6 (six) hours as needed for wheezing.   1 Inhaler   2   . budesonide-formoterol (SYMBICORT) 160-4.5 MCG/ACT inhaler   Inhalation   Inhale 2 puffs into the lungs 2 (two) times daily as needed (shortness of breath).   3 Inhaler   1 year   . hydrochlorothiazide (HYDRODIURIL) 25 MG tablet   Oral   Take 25 mg by mouth daily.         Marland Kitchen ibuprofen (ADVIL,MOTRIN) 800 MG tablet   Oral   Take 1 tablet (800 mg total) by mouth 3 (three) times daily. Take with food.   21 tablet   0   . montelukast (SINGULAIR) 10 MG tablet  Oral   Take 1 tablet (10 mg total) by mouth at bedtime.   30 tablet   2   . potassium chloride SA (K-DUR,KLOR-CON) 20 MEQ tablet   Oral   Take 0.5 tablets (10 mEq total) by mouth daily.   30 tablet   2   . traMADol (ULTRAM) 50 MG tablet   Oral   Take 50 mg by mouth every 6 (six) hours as needed for pain.         Marland Kitchen triamcinolone cream (KENALOG) 0.1 %   Topical   Apply topically 2 (two) times daily.   30 g   1   . cephALEXin (KEFLEX) 500 MG capsule   Oral   Take 1 capsule (500 mg total) by mouth 4 (four) times daily.   40 capsule   0   . HYDROcodone-acetaminophen (NORCO/VICODIN) 5-325 MG per tablet   Oral   Take 1-2 tablets by mouth every 6 (six) hours as needed for moderate pain or severe pain.   17 tablet   0   .  sulfamethoxazole-trimethoprim (BACTRIM DS,SEPTRA DS) 800-160 MG per tablet   Oral   Take 1 tablet by mouth 2 (two) times daily.   20 tablet   0    BP 143/88  Pulse 95  Temp(Src) 99 F (37.2 C) (Oral)  Resp 16  Wt 350 lb (158.759 kg)  SpO2 98%  LMP 06/06/2013  Physical Exam  Nursing note and vitals reviewed. Constitutional: She is oriented to person, place, and time. She appears well-developed and well-nourished. No distress.  HENT:  Head: Normocephalic and atraumatic.  Eyes: Conjunctivae and EOM are normal. No scleral icterus.  Neck: Normal range of motion.  Cardiovascular: Normal rate, regular rhythm and intact distal pulses.   DP and PT pulses 2+ b/l  Pulmonary/Chest: Effort normal. No respiratory distress.  Musculoskeletal: Normal range of motion.       Left knee: Normal.       Left ankle: Normal.       Left upper leg: Normal.       Left lower leg: She exhibits tenderness, swelling and edema (1+ pitting edema). She exhibits no bony tenderness and no deformity.       Legs: Neurological: She is alert and oriented to person, place, and time.  No gross sensory deficits appreciated. Patient moves extremities without ataxia. Patellar and Achilles reflexes 2+ in LLE.  Skin: Skin is warm and dry. No rash noted. She is not diaphoretic. No erythema. No pallor.  Psychiatric: She has a normal mood and affect. Her behavior is normal.    ED Course  Procedures (including critical care time) Labs Review Labs Reviewed  CBC - Abnormal; Notable for the following:    WBC 15.7 (*)    All other components within normal limits  POCT I-STAT, CHEM 8 - Abnormal; Notable for the following:    Glucose, Bld 113 (*)    All other components within normal limits   Imaging Review Dg Tibia/fibula Left  06/21/2013   CLINICAL DATA:  Left leg pain, diaphoresis and chills.  EXAM: LEFT TIBIA AND FIBULA - 2 VIEW  COMPARISON:  Left knee radiographs from 06/16/2013  FINDINGS: There is no evidence of  fracture or dislocation. The tibia and fibula appear intact. No osseous erosion is seen. The knee joint is grossly unremarkable in appearance. No knee joint effusion is identified. The ankle joint is grossly unremarkable.  Mild soft tissue swelling is suggested about the left leg. No radiopaque foreign  bodies are identified.  IMPRESSION: No evidence of fracture or dislocation. No osseous erosion seen. No radiopaque foreign bodies identified.   Electronically Signed   By: Garald Balding M.D.   On: 06/21/2013 02:49    EKG Interpretation   None       MDM   1. Cellulitis of leg, left    33 year old female presents for left lower extremity swelling. She is neurovascularly intact in physical exam. Physical exam findings consistent with cellulitis. No red linear streaking appreciated. X-ray today negative for subcutaneous free air. Tenderness to palpation in physical exam does not appear to be out of proportion to physical exam findings. Patient is well and nontoxic appearing, hemodynamically stable, and afebrile. Labs significant for leukocytosis of 15.7.  Doubt DVT as swelling appears confined to the soft tissue and spares patient's L foot. Doubt necrotizing fasciitis as she states she only noticed symptoms and pain onset yesterday AM and tenderness on exam does not appear out of proportion to findings. No evidence to suggest nec fasc on X-ray and patient afebrile.   I have reviewed the patient's chart and noticed that she has been admitted at least 2 times in the past for similar symptoms. Previous charts make note that patient has failed outpatient therapy with oral antibiotics (most recently in 02/2012); however, patient only discharged with oral Keflex at this time which does not cover for MRSA. She has a known hx of MRSA + cellulitis and appears to have had successful oral treatment as outpatient back on 01/27/2013 with Bactrim/Keflex regimen. Patient treated in ED with 1g Vancomycin. Will attempt to  manage symptoms as outpatient today with Bactrim/Keflex. Return precautions advised and patient agreeable to plan with no unaddressed concerns.   Filed Vitals:   06/21/13 0445 06/21/13 0500 06/21/13 0515 06/21/13 0549  BP: 120/81 141/78 143/88 106/69  Pulse: 99 99 95 100  Temp:      TempSrc:      Resp:   16 20  Weight:      SpO2: 99% 100% 98% 99%       Antonietta Breach, PA-C 06/21/13 0600

## 2013-06-23 ENCOUNTER — Ambulatory Visit: Payer: Self-pay

## 2013-06-25 ENCOUNTER — Other Ambulatory Visit (HOSPITAL_COMMUNITY)
Admission: RE | Admit: 2013-06-25 | Discharge: 2013-06-25 | Disposition: A | Discharge status: Home / Self Care | Payer: Self-pay | Source: Ambulatory Visit | Attending: Obstetrics & Gynecology | Admitting: Obstetrics & Gynecology

## 2013-06-25 ENCOUNTER — Ambulatory Visit (INDEPENDENT_AMBULATORY_CARE_PROVIDER_SITE_OTHER): Payer: Self-pay | Admitting: Obstetrics & Gynecology

## 2013-06-25 ENCOUNTER — Encounter: Payer: Self-pay | Admitting: Obstetrics & Gynecology

## 2013-06-25 VITALS — BP 142/95 | HR 83 | Temp 98.3°F | Ht 64.0 in | Wt 346.1 lb

## 2013-06-25 DIAGNOSIS — Z01812 Encounter for preprocedural laboratory examination: Secondary | ICD-10-CM

## 2013-06-25 DIAGNOSIS — N870 Mild cervical dysplasia: Secondary | ICD-10-CM | POA: Insufficient documentation

## 2013-06-25 DIAGNOSIS — R87611 Atypical squamous cells cannot exclude high grade squamous intraepithelial lesion on cytologic smear of cervix (ASC-H): Secondary | ICD-10-CM

## 2013-06-25 DIAGNOSIS — D259 Leiomyoma of uterus, unspecified: Secondary | ICD-10-CM

## 2013-06-25 LAB — POCT PREGNANCY, URINE: Preg Test, Ur: NEGATIVE

## 2013-06-25 NOTE — Progress Notes (Signed)
Patient ID: Marissa Joseph, female   DOB: Oct 24, 1980, 33 y.o.   MRN: [id number]  Chief Complaint  Patient presents with  . Colposcopy    HPI [name] is a 33 y.o. female.  G0P0 Patient's last menstrual period was 06/06/2013.' Dysmenorrhea. Menses last 4-6 days, h/o fibroids on [place] 2011 ASC-H on pap   HPI  Indications: Pap smear on December 2014 showed: ASC-H, neg HR HPV. Previous colposcopy: none. Prior cervical treatment: no treatment.  Past Medical History  Diagnosis Date  . Asthma   . Fibroids   . Hypertension   . Obesity   . Heart murmur   . Eczema   . MRSA (methicillin resistant staph aureus) culture positive 05/2012    cellulits/ left leg  . Anxiety     no official dx    Past Surgical History  Procedure Laterality Date  . No past surgeries      Family History  Problem Relation Age of Onset  . Diabetes Father   . Diabetes Mother   . Cancer Maternal Uncle     prostate  . Cancer Paternal Aunt     lung  . Heart disease Maternal Grandmother   . Diabetes Maternal Grandmother   . Stroke Maternal Grandfather     Social History History  Substance Use Topics  . Smoking status: Current Every Day Smoker -- 0.25 packs/day for 1 years    Types: Cigarettes  . Smokeless tobacco: Never Used  . Alcohol Use: Yes     Comment: occ    Allergies  Allergen Reactions  . Shellfish Allergy Hives, Shortness Of Breath and Swelling  . Avocado     Throat itching and swelling    Current Outpatient Prescriptions  Medication Sig Dispense Refill  . albuterol (PROVENTIL HFA;VENTOLIN HFA) 108 (90 BASE) MCG/ACT inhaler Inhale 2 puffs into the lungs every 6 (six) hours as needed for wheezing.  1 Inhaler  2  . budesonide-formoterol (SYMBICORT) 160-4.5 MCG/ACT inhaler Inhale 2 puffs into the lungs 2 (two) times daily as needed (shortness of breath).  3 Inhaler  1 year  . cephALEXin (KEFLEX) 500 MG capsule Take 1 capsule (500 mg total) by mouth 4 (four) times daily.  40  capsule  0  . hydrochlorothiazide (HYDRODIURIL) 25 MG tablet Take 25 mg by mouth daily.      Marland Kitchen HYDROcodone-acetaminophen (NORCO/VICODIN) 5-325 MG per tablet Take 1-2 tablets by mouth every 6 (six) hours as needed for moderate pain or severe pain.  17 tablet  0  . ibuprofen (ADVIL,MOTRIN) 800 MG tablet Take 1 tablet (800 mg total) by mouth 3 (three) times daily. Take with food.  21 tablet  0  . montelukast (SINGULAIR) 10 MG tablet Take 1 tablet (10 mg total) by mouth at bedtime.  30 tablet  2  . potassium chloride SA (K-DUR,KLOR-CON) 20 MEQ tablet Take 0.5 tablets (10 mEq total) by mouth daily.  30 tablet  2  . traMADol (ULTRAM) 50 MG tablet Take 50 mg by mouth every 6 (six) hours as needed for pain.      Marland Kitchen triamcinolone cream (KENALOG) 0.1 % Apply topically 2 (two) times daily.  30 g  1  . sulfamethoxazole-trimethoprim (BACTRIM DS,SEPTRA DS) 800-160 MG per tablet Take 1 tablet by mouth 2 (two) times daily.  20 tablet  0   No current facility-administered medications for this visit.    Review of Systems Review of Systems  Blood pressure 142/95, pulse 83, temperature 98.3 F (36.8 C), temperature  source Oral, height 5\' 4"  (1.626 m), weight 346 lb 1.6 oz (156.99 kg), last menstrual period 06/06/2013.  Physical Exam Physical Exam  Constitutional: She is oriented to person, place, and time. She appears well-developed. No distress.  Genitourinary: Vagina normal. No vaginal discharge found.  Neurological: She is alert and oriented to person, place, and time.  Skin: Skin is warm.  Psychiatric: She has a normal mood and affect. Her behavior is normal.    Data Reviewed  CLINICAL DATA: Dysmenorrhea. Fibroids. Pelvic pain. LMP 06/06/2013.  EXAM:  TRANSABDOMINAL AND TRANSVAGINAL ULTRASOUND OF PELVIS  TECHNIQUE:  Both transabdominal and transvaginal ultrasound examinations of the  pelvis were performed. Transabdominal technique was performed for  global imaging of the pelvis including uterus,  ovaries, adnexal  regions, and pelvic cul-de-sac. It was necessary to proceed with  endovaginal exam following the transabdominal exam to visualize the  endometrium and ovaries.  COMPARISON: 02/11/2010  FINDINGS:  Uterus  Measurements: 14.2 x 7.7 x 8.5 cm. Multiple fibroids are seen. The  largest fibroid is subserosal in location in the posterior fundus  measuring 7.2 cm. Two other fibroids in the right and left uterine  corpus measure 4.8 cm and 4.6 cm respectively. A smaller fibroid  measuring 1.7 cm is seen in the left anterior lower uterine segment.  Endometrium  Thickness: 16 mm. No focal abnormality visualized.  Right ovary  Measurements: Not directly visualized by transabdominal or  transvaginal sonography, however no adnexal mass identified.  Left ovary  Measurements: Not directly visualized by transabdominal or  transvaginal sonography, however no adnexal mass identified.  Other findings  No free fluid.  IMPRESSION:  Increased size of myomatous uterus, with largest fibroid now  measuring 7.2 cm.  Nonvisualization of ovaries, however no adnexal mass identified.  Electronically Signed  By: Earle Gell M.D.  On: 06/17/2013 17:13   Assessment    Procedure Details  The risks and benefits of the procedure and Written informed consent obtained.  Speculum placed in vagina and excellent visualization of cervix achieved, cervix swabbed x 3 with acetic acid solution. Mild punctation over portio with no discrete AWE seen, endocervix normal.  Specimens: ECC and 10 and 6 o'clock  Complications: none.     Plan    Specimens labelled and sent to Pathology. Return to discuss Pathology results in 2 weeks.      Marissa Joseph 06/25/2013, 1:37 PM

## 2013-06-25 NOTE — Patient Instructions (Signed)
Colposcopy  Colposcopy is a procedure to examine your cervix and vagina, or the area around the outside of your vagina, for abnormalities or signs of disease. The procedure is done using a lighted microscope called a colposcope. Tissue samples may be collected during the colposcopy if your health care provider finds any unusual cells. A colposcopy may be done if a woman has:   An abnormal Pap test. A Pap test is a medical test done to evaluate cells that are on the surface of the cervix.   A Pap test result that is suggestive of human papillomavirus (HPV). This virus can cause genital warts and is linked to the development of cervical cancer.   A sore on her cervix and the results of a Pap test were normal.   Genital warts on the cervix or in or around the outside of the vagina.   A mother who took the drug diethylstilbestrol (DES) while pregnant.   Painful intercourse.   Vaginal bleeding, especially after sexual intercourse.  LET YOUR HEALTH CARE PROVIDER KNOW ABOUT:   Any allergies you have.   All medicines you are taking, including vitamins, herbs, eye drops, creams, and over-the-counter medicines.   Previous problems you or members of your family have had with the use of anesthetics.   Any blood disorders you have.   Previous surgeries you have had.   Medical conditions you have.  RISKS AND COMPLICATIONS  Generally, a colposcopy is a safe procedure. However, as with any procedure, complications can occur. Possible complications include:   Bleeding.   Infection.   Missed lesions.  BEFORE THE PROCEDURE    Tell your health care provider if you have your menstrual period. A colposcopy typically is not done during menstruation.   For 24 hours before the colposcopy, do not:   Douche.   Use tampons.   Use medicines, creams, or suppositories in the vagina.   Have sexual intercourse.  PROCEDURE   During the procedure, you will be lying on your back with your feet in foot rests (stirrups). A warm  metal or plastic instrument (speculum) will be placed in your vagina to keep it open and to allow the health care provider to see the cervix. The colposcope will be placed outside the vagina. It will be used to magnify and examine the cervix, vagina, and the area around the outside of the vagina. A small amount of liquid solution will be placed on the area that is to be viewed. This solution will make it easier to see the abnormal cells. Your health care provider will use tools to suck out mucus and cells from the canal of the cervix. Then he or she will record the location of the abnormal areas.  If a biopsy is done during the procedure, a medicine will usually be given to numb the area (local anesthetic). You may feel mild pain or cramping while the biopsy is done. After the procedure, tissue samples collected during the biopsy will be sent to a lab for analysis.  AFTER THE PROCEDURE   You will be given instructions on when to follow up with your health care provider for your test results. It is important to keep your appointment.  Document Released: 08/12/2002 Document Revised: 01/22/2013 Document Reviewed: 12/19/2012  [employer] Patient Information 2014 [employer].

## 2013-07-09 ENCOUNTER — Encounter: Payer: Self-pay | Admitting: Obstetrics & Gynecology

## 2013-07-09 ENCOUNTER — Ambulatory Visit (INDEPENDENT_AMBULATORY_CARE_PROVIDER_SITE_OTHER): Payer: Self-pay | Admitting: Obstetrics & Gynecology

## 2013-07-09 VITALS — BP 142/94 | HR 92 | Temp 98.0°F | Ht 64.0 in | Wt 342.8 lb

## 2013-07-09 DIAGNOSIS — R87611 Atypical squamous cells cannot exclude high grade squamous intraepithelial lesion on cytologic smear of cervix (ASC-H): Secondary | ICD-10-CM

## 2013-07-09 NOTE — Patient Instructions (Signed)
Loop Electrosurgical Excision Procedure  Loop electrosurgical excision procedure (LEEP) is the removal of a portion of the lower part of the uterus (cervix). The procedure is done when there are significantly abnormal cervical cell changes. Abnormal cell changes of the cervix can lead to cancer if left in place and untreated.     The LEEP procedure itself typically only takes a few minutes. Often, it may be done in your caregiver's office. The procedure is considered safe for those who wish to get pregnant or are trying to get pregnant. Only under rare circumstances should this procedure be done if you are pregnant.  LET YOUR CAREGIVER KNOW ABOUT:  · Whether you are pregnant or late for your last menstrual period.  · Allergies to foods or medicines.  · All the medicines you are taking including herbs, eyedrops, and over-the-counter medicines, and creams.  · Use of steroids (by mouth or creams).  · Previous problems with anesthetics or numbing medicine.  · Previous gynecological surgery.  · History of blood clots or bleeding problems.  · Any recent or current vaginal infections (herpes, sexually transmitted infections).  · Other health problems.  RISKS AND COMPLICATIONS  · Bleeding.  · Infection.  · Injury to the vagina, bladder, or rectum.  · Very rare obstruction of the cervical opening that causes problems during menstruation (cervical stenosis).  BEFORE THE PROCEDURE  · Do not take aspirin or blood thinners (anticoagulants) for 1 week before the procedure, or as told by your caregiver.  · Eat a light meal before the procedure.  · Ask your caregiver about changing or stopping your regular medicines.  · You may be given a pain reliever 1 or 2 hours before the procedure.  PROCEDURE   · A tool (speculum) is placed in the vagina. This allows your caregiver to see the cervix.  · An iodine stain is applied to the cervix to find the area of abnormal cells to be removed.  · Medicine is injected to numb the cervix (local  anesthetic).    · Electricity is passed through a thin wire loop which is then used to remove (cauterize) a small segment of the affected cervix.  · Light electrocautery is used to seal any small blood vessels and prevent bleeding.  · A paste may be applied to the cauterized area of the cervix to help prevent bleeding.  · The tissue sample is sent to the lab. It is examined under the microscope.  AFTER THE PROCEDURE  · Have someone drive you home.  · You may have slight to moderate cramping.  · You may notice a black vaginal discharge from the paste used on the cervix to prevent bleeding. This is normal.  · Watch for excessive bleeding. This requires immediate medical care.  · Ask when your test results will be ready. Make sure you get your test results.  Document Released: 08/12/2002 Document Revised: 08/14/2011 Document Reviewed: 11/01/2010  [employer]® Patient Information ©2014 [employer].

## 2013-07-10 ENCOUNTER — Other Ambulatory Visit: Payer: Self-pay | Admitting: Internal Medicine

## 2013-07-10 MED ORDER — BUDESONIDE-FORMOTEROL FUMARATE 160-4.5 MCG/ACT IN AERO: 2.0000 | INHALATION_SPRAY | Freq: Two times a day (BID) | RESPIRATORY_TRACT | Status: DC | PRN

## 2013-07-12 NOTE — Progress Notes (Signed)
Patient ID: Marissa Joseph, female   DOB: 05/20/1981, 33 y.o.   MRN: [id number] G0P0 Patient's last menstrual period was 07/01/2013. 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 Procedure will be scheduled.  Woodroe Mode, MD

## 2013-07-15 ENCOUNTER — Telehealth: Payer: Self-pay | Admitting: *Deleted

## 2013-07-15 NOTE — Telephone Encounter (Signed)
Pt called nurse line request clarification of appts.  Pt states she has 2 appts scheduled and wants to confirm.

## 2013-07-16 NOTE — Telephone Encounter (Signed)
Called Dinesha and left a message we are returning your message. Confirmed that she has one appt with [place], reveiwed date/time and that it was a procedure- please call if you still have questions.

## 2013-07-17 ENCOUNTER — Encounter: Payer: Self-pay | Admitting: Obstetrics & Gynecology

## 2013-07-17 NOTE — Telephone Encounter (Signed)
Called patient, no answer- left message that we are trying to return your phone call, if you still have questions or need assistance, please call us back at the clinics

## 2013-07-23 ENCOUNTER — Encounter: Payer: Self-pay | Admitting: Obstetrics & Gynecology

## 2013-07-29 ENCOUNTER — Ambulatory Visit: Payer: Self-pay | Admitting: Internal Medicine

## 2013-08-29 ENCOUNTER — Encounter: Payer: Self-pay | Admitting: Internal Medicine

## 2013-08-29 ENCOUNTER — Ambulatory Visit: Payer: No Typology Code available for payment source | Attending: Internal Medicine | Admitting: Internal Medicine

## 2013-08-29 ENCOUNTER — Ambulatory Visit: Payer: No Typology Code available for payment source | Attending: Internal Medicine

## 2013-08-29 VITALS — BP 137/97 | HR 68 | Temp 98.4°F | Resp 16

## 2013-08-29 DIAGNOSIS — M1710 Unilateral primary osteoarthritis, unspecified knee: Secondary | ICD-10-CM | POA: Insufficient documentation

## 2013-08-29 DIAGNOSIS — M25562 Pain in left knee: Secondary | ICD-10-CM

## 2013-08-29 DIAGNOSIS — M25569 Pain in unspecified knee: Secondary | ICD-10-CM

## 2013-08-29 DIAGNOSIS — IMO0002 Reserved for concepts with insufficient information to code with codable children: Secondary | ICD-10-CM

## 2013-08-29 DIAGNOSIS — M1712 Unilateral primary osteoarthritis, left knee: Secondary | ICD-10-CM | POA: Insufficient documentation

## 2013-08-29 MED ORDER — IBUPROFEN 800 MG PO TABS: 800.0000 mg | ORAL_TABLET | Freq: Three times a day (TID) | ORAL | Status: DC

## 2013-08-29 NOTE — Progress Notes (Signed)
MRN: [id number] Name: Marissa Joseph  Sex: female Age: 33 y.o. DOB: 04/25/1981  Allergies: Shellfish allergy and Avocado  Chief Complaint  Patient presents with  . Follow-up    HPI: Patient is 33 y.o. female who comes today reported to have left knee pain on and off for several months, EMR reviewed patient had x-ray done January reported to have osteoarthritis, patient is requesting pain medication, occasionally pain is more worse, patient reports family history of arthritis. Patient is also obese trying to lose weight.  Past Medical History  Diagnosis Date  . Asthma   . Fibroids   . Hypertension   . Obesity   . Heart murmur   . Eczema   . MRSA (methicillin resistant staph aureus) culture positive 05/2012    cellulits/ left leg  . Anxiety     no official dx    Past Surgical History  Procedure Laterality Date  . No past surgeries        Medication List       This list is accurate as of: 08/29/13  5:10 PM.  Always use your most recent med list.               albuterol 108 (90 BASE) MCG/ACT inhaler  Commonly known as:  PROVENTIL HFA;VENTOLIN HFA  Inhale 2 puffs into the lungs every 6 (six) hours as needed for wheezing.     budesonide-formoterol 160-4.5 MCG/ACT inhaler  Commonly known as:  SYMBICORT  Inhale 2 puffs into the lungs 2 (two) times daily as needed (shortness of breath).     hydrochlorothiazide 25 MG tablet  Commonly known as:  HYDRODIURIL  Take 25 mg by mouth daily.     HYDROcodone-acetaminophen 5-325 MG per tablet  Commonly known as:  NORCO/VICODIN  Take 1-2 tablets by mouth every 6 (six) hours as needed for moderate pain or severe pain.     ibuprofen 800 MG tablet  Commonly known as:  ADVIL,MOTRIN  Take 1 tablet (800 mg total) by mouth 3 (three) times daily. Take with food.     montelukast 10 MG tablet  Commonly known as:  SINGULAIR  Take 1 tablet (10 mg total) by mouth at bedtime.     potassium chloride SA 20 MEQ tablet    Commonly known as:  K-DUR,KLOR-CON  Take 0.5 tablets (10 mEq total) by mouth daily.     traMADol 50 MG tablet  Commonly known as:  ULTRAM  Take 50 mg by mouth every 6 (six) hours as needed for pain.     triamcinolone cream 0.1 %  Commonly known as:  KENALOG  Apply topically 2 (two) times daily.        Meds ordered this encounter  Medications  . ibuprofen (ADVIL,MOTRIN) 800 MG tablet    Sig: Take 1 tablet (800 mg total) by mouth 3 (three) times daily. Take with food.    Dispense:  30 tablet    Refill:  1    Order Specific Question:  Supervising Provider    Answer:  Noemi Chapel D [[id number]]    Immunization History  Administered Date(s) Administered  . Influenza Split 06/04/2012  . Pneumococcal Polysaccharide-23 06/04/2012  . Td 03/24/2008    Family History  Problem Relation Age of Onset  . Diabetes Father   . Diabetes Mother   . Cancer Maternal Uncle     prostate  . Cancer Paternal Aunt     lung  . Heart disease Maternal Grandmother   . Diabetes  Maternal Grandmother   . Stroke Maternal Grandfather     History  Substance Use Topics  . Smoking status: Current Every Day Smoker -- 0.25 packs/day for 1 years    Types: Cigarettes  . Smokeless tobacco: Never Used  . Alcohol Use: Yes     Comment: occ    Review of Systems   As noted in HPI  Filed Vitals:   08/29/13 1642  BP: 137/97  Pulse: 68  Temp: 98.4 F (36.9 C)  Resp: 16    Physical Exam  Physical Exam  Eyes: EOM are normal. Pupils are equal, round, and reactive to light.  Cardiovascular: Normal rate and regular rhythm.   Pulmonary/Chest: Breath sounds normal. No respiratory distress. She has no wheezes. She has no rales.  Musculoskeletal:  Left knee minimal tenderness medially slightly swollen compared to right knee, crepitation+    CBC    Component Value Date/Time   WBC 15.7* 06/21/2013 0218   RBC 4.32 06/21/2013 0218   HGB 15.0 06/21/2013 0223   HCT 44.0 06/21/2013 0223   PLT 229 06/21/2013  0218   MCV 92.8 06/21/2013 0218   LYMPHSABS 0.8 12/27/2012 0518   MONOABS 0.4 12/27/2012 0518   EOSABS 0.0 12/27/2012 0518   BASOSABS 0.0 12/27/2012 0518    CMP     Component Value Date/Time   NA 140 06/21/2013 0223   K 3.7 06/21/2013 0223   CL 103 06/21/2013 0223   CO2 29 12/30/2012 0620   GLUCOSE 113* 06/21/2013 0223   BUN 13 06/21/2013 0223   CREATININE 1.00 06/21/2013 0223   CALCIUM 8.9 12/30/2012 0620   PROT 6.8 12/27/2012 0518   ALBUMIN 2.9* 12/27/2012 0518   AST 11 12/27/2012 0518   ALT 9 12/27/2012 0518   ALKPHOS 56 12/27/2012 0518   BILITOT 0.5 12/27/2012 0518   GFRNONAA 72* 12/30/2012 0620   GFRAA 84* 12/30/2012 0620    Lab Results  Component Value Date/Time   CHOL 124 03/24/2008 10:16 PM    No components found with this basename: hga1c    Lab Results  Component Value Date/Time   AST 11 12/27/2012  5:18 AM    Assessment and Plan  Left knee pain - Plan: Ambulatory referral to Orthopedic Surgery, ibuprofen (ADVIL,MOTRIN) 800 MG tablet  Osteoarthritis of left knee - Plan: Ambulatory referral to Orthopedic Surgery, ibuprofen (ADVIL,MOTRIN) 800 MG tablet    Return if symptoms worsen or fail to improve.  Lorayne Marek, MD

## 2013-08-29 NOTE — Progress Notes (Signed)
Patient here for follow up Complains of pain and swelling to her left knee

## 2013-09-03 ENCOUNTER — Encounter: Payer: Self-pay | Admitting: Obstetrics & Gynecology

## 2013-09-03 ENCOUNTER — Other Ambulatory Visit (HOSPITAL_COMMUNITY)
Admission: RE | Admit: 2013-09-03 | Discharge: 2013-09-03 | Disposition: A | Discharge status: Home / Self Care | Payer: No Typology Code available for payment source | Source: Ambulatory Visit | Attending: Obstetrics & Gynecology | Admitting: Obstetrics & Gynecology

## 2013-09-03 ENCOUNTER — Ambulatory Visit (INDEPENDENT_AMBULATORY_CARE_PROVIDER_SITE_OTHER): Payer: No Typology Code available for payment source | Admitting: Obstetrics & Gynecology

## 2013-09-03 VITALS — BP 123/78 | HR 79 | Temp 97.6°F | Resp 20 | Ht 64.0 in | Wt 335.0 lb

## 2013-09-03 DIAGNOSIS — N870 Mild cervical dysplasia: Secondary | ICD-10-CM | POA: Insufficient documentation

## 2013-09-03 DIAGNOSIS — R87613 High grade squamous intraepithelial lesion on cytologic smear of cervix (HGSIL): Secondary | ICD-10-CM

## 2013-09-03 DIAGNOSIS — Z01812 Encounter for preprocedural laboratory examination: Secondary | ICD-10-CM

## 2013-09-03 LAB — POCT PREGNANCY, URINE: Preg Test, Ur: NEGATIVE

## 2013-09-03 NOTE — Progress Notes (Signed)
Pt has seen LEEP video but still does not completely understand the indication for the procedure.

## 2013-09-03 NOTE — Progress Notes (Signed)
  G0P0 Patient's last menstrual period was 08/12/2013. Procedre and indication explained.Patient identified, informed consent obtained, signed copy in chart, time out performed.  Pap smear and colposcopy reviewed.   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  Cervix visualized. Paracervical block placed.  Medium Fisher size loop used to remove cone of cervix using blend of cut and cautery on LEEP machine.  Edges/Base cauterized with Ball.  Monsel's solution used for hemostasis.  Patient tolerated procedure well.  Patient given post procedure instructions.  Follow up in 12 months for repeat pap and cotesting or as needed.  Woodroe Mode, MD 09/03/2013

## 2013-09-08 ENCOUNTER — Telehealth: Payer: Self-pay | Admitting: *Deleted

## 2013-09-08 NOTE — Telephone Encounter (Addendum)
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 There is no other contact number for pt at this time.  I asked her mother to give [name] a message to call us at her convenience.  She agreed. Diane Day RNC

## 2013-09-09 NOTE — Telephone Encounter (Signed)
Marissa Joseph called and left a message she is returning our call. Called both her home and mobile number and left message we are calling back with some information- please call clinic.

## 2013-09-10 NOTE — Telephone Encounter (Signed)
Called patient gave results, patient states she needs a follow up appointment for discharge.  Pt to call clinic for appointment when off work.

## 2013-09-19 ENCOUNTER — Ambulatory Visit: Payer: No Typology Code available for payment source | Admitting: Family Medicine

## 2013-09-23 ENCOUNTER — Ambulatory Visit (INDEPENDENT_AMBULATORY_CARE_PROVIDER_SITE_OTHER): Payer: No Typology Code available for payment source | Admitting: Family Medicine

## 2013-09-23 ENCOUNTER — Encounter: Payer: Self-pay | Admitting: Family Medicine

## 2013-09-23 VITALS — BP 122/84 | HR 89 | Ht 64.0 in | Wt 329.0 lb

## 2013-09-23 DIAGNOSIS — IMO0002 Reserved for concepts with insufficient information to code with codable children: Secondary | ICD-10-CM

## 2013-09-23 DIAGNOSIS — M1712 Unilateral primary osteoarthritis, left knee: Secondary | ICD-10-CM

## 2013-09-23 DIAGNOSIS — M25562 Pain in left knee: Secondary | ICD-10-CM

## 2013-09-23 DIAGNOSIS — M25569 Pain in unspecified knee: Secondary | ICD-10-CM

## 2013-09-23 DIAGNOSIS — M1710 Unilateral primary osteoarthritis, unspecified knee: Secondary | ICD-10-CM

## 2013-09-23 MED ORDER — METHYLPREDNISOLONE ACETATE 40 MG/ML IJ SUSP
40.0000 mg | Freq: Once | INTRAMUSCULAR | Status: AC
  Administered 2013-09-23: 40 mg via INTRA_ARTICULAR

## 2013-09-23 NOTE — Patient Instructions (Signed)
Your knee pain and swelling are due to arthritis. Take tylenol 500mg  1-2 tabs three times a day for pain. Aleve 1-2 tabs twice a day with food Glucosamine sulfate 750mg  twice a day is a supplement that may help. Capsaicin topically up to four times a day may also help with pain. Cortisone injections are an option - you were given one today. It's important that you continue to stay active. If you are overweight, try to lose weight through diet and exercise. Straight leg raises, knee extensions 3 sets of 10 once a day (add ankle weight if these become too easy). Consider physical therapy to strengthen muscles around the joint that hurts to take pressure off of the joint itself. Shoe inserts with good arch support may be helpful. Walker or cane if needed. Heat or ice 15 minutes at a time 3-4 times a day as needed to help with pain. Water aerobics and cycling with low resistance are the best two types of exercise for arthritis. Follow up with me as needed.

## 2013-09-25 ENCOUNTER — Encounter: Payer: Self-pay | Admitting: Family Medicine

## 2013-09-25 NOTE — Progress Notes (Signed)
Patient ID: Marissa Joseph, female   DOB: 01/02/1981, 33 y.o.   MRN: [id number]  PCP: Lorayne Marek, MD  Subjective:   HPI: Patient is a 33 y.o. female here for left knee pain.  Patietn reports she is on her feet a lot at work. Does not recall an acute injury though. Had pain for several months but started to be constant since January. X-rays showed mild to moderate arthritis. + swelling. Pain radiates to posterior knee. Tried ibuprofen, stretches, icing. No catching, locking, giving out.  Past Medical History  Diagnosis Date  . Asthma   . Fibroids   . Hypertension   . Obesity   . Heart murmur   . Eczema   . MRSA (methicillin resistant staph aureus) culture positive 05/2012    cellulits/ left leg  . Anxiety     no official dx    Current Outpatient Prescriptions on File Prior to Visit  Medication Sig Dispense Refill  . albuterol (PROVENTIL HFA;VENTOLIN HFA) 108 (90 BASE) MCG/ACT inhaler Inhale 2 puffs into the lungs every 6 (six) hours as needed for wheezing.  1 Inhaler  2  . budesonide-formoterol (SYMBICORT) 160-4.5 MCG/ACT inhaler Inhale 2 puffs into the lungs 2 (two) times daily as needed (shortness of breath).  3 Inhaler  4  . hydrochlorothiazide (HYDRODIURIL) 25 MG tablet Take 25 mg by mouth daily.      Marland Kitchen HYDROcodone-acetaminophen (NORCO/VICODIN) 5-325 MG per tablet Take 1-2 tablets by mouth every 6 (six) hours as needed for moderate pain or severe pain.  17 tablet  0  . ibuprofen (ADVIL,MOTRIN) 800 MG tablet Take 1 tablet (800 mg total) by mouth 3 (three) times daily. Take with food.  30 tablet  1  . montelukast (SINGULAIR) 10 MG tablet Take 1 tablet (10 mg total) by mouth at bedtime.  30 tablet  2  . potassium chloride SA (K-DUR,KLOR-CON) 20 MEQ tablet Take 0.5 tablets (10 mEq total) by mouth daily.  30 tablet  2  . traMADol (ULTRAM) 50 MG tablet Take 50 mg by mouth every 6 (six) hours as needed for pain.      Marland Kitchen triamcinolone cream (KENALOG) 0.1 % Apply topically  2 (two) times daily.  30 g  1   No current facility-administered medications on file prior to visit.    Past Surgical History  Procedure Laterality Date  . No past surgeries      Allergies  Allergen Reactions  . Shellfish Allergy Hives, Shortness Of Breath and Swelling  . Avocado     Throat itching and swelling    History   Social History  . Marital Status: Single    Spouse Name: N/A    Number of Children: N/A  . Years of Education: N/A   Occupational History  . Not on file.   Social History Main Topics  . Smoking status: Current Every Day Smoker -- 0.25 packs/day for 1 years    Types: Cigarettes  . Smokeless tobacco: Never Used  . Alcohol Use: Yes     Comment: occ  . Drug Use: No  . Sexual Activity: Yes    Birth Control/ Protection: Condom   Other Topics Concern  . Not on file   Social History Narrative  . No narrative on file    Family History  Problem Relation Age of Onset  . Diabetes Father   . Diabetes Mother   . Cancer Maternal Uncle     prostate  . Cancer Paternal Aunt  lung  . Heart disease Maternal Grandmother   . Diabetes Maternal Grandmother   . Stroke Maternal Grandfather     BP 122/84  Pulse 89  Ht 5\' 4"  (1.626 m)  Wt 329 lb (149.233 kg)  BMI 56.44 kg/m2  LMP 08/12/2013  Review of Systems: See HPI above.    Objective:  Physical Exam:  Gen: NAD  Left knee: No gross deformity, ecchymoses.  Mod effusion. TTP medial joint line.  No other tenderness. FROM. Negative ant/post drawers. Negative valgus/varus testing. Negative lachmanns. Negative mcmurrays, apleys, patellar apprehension, clarkes. NV intact distally.    Assessment & Plan:  1. Left knee pain - radiographs showed mild-moderate arthritis consistent with where she is tender.  Discussed options.  Reviewed tylenol, nsaids, glucosamine, capsaicin.  Home exercise program.  Intraarticular injection given under ultrasound guidance.  See instructions for further F/u  prn.  After informed written consent, patient was lying supine on exam table. Left knee was prepped with alcohol swab and utilizing superolateral approach under ultrasound guidance, patient's left knee was injected intraarticularly with 3:1 marcaine: depomedrol. Patient tolerated the procedure well without immediate complications.

## 2013-09-25 NOTE — Assessment & Plan Note (Signed)
radiographs showed mild-moderate arthritis consistent with where she is tender.  Discussed options.  Reviewed tylenol, nsaids, glucosamine, capsaicin.  Home exercise program.  Intraarticular injection given under ultrasound guidance.  See instructions for further F/u prn.  After informed written consent, patient was lying supine on exam table. Left knee was prepped with alcohol swab and utilizing superolateral approach under ultrasound guidance, patient's left knee was injected intraarticularly with 3:1 marcaine: depomedrol. Patient tolerated the procedure well without immediate complications.

## 2013-10-14 ENCOUNTER — Ambulatory Visit: Payer: No Typology Code available for payment source | Attending: Family Medicine | Admitting: Rehabilitation

## 2013-10-14 DIAGNOSIS — R609 Edema, unspecified: Secondary | ICD-10-CM | POA: Insufficient documentation

## 2013-10-14 DIAGNOSIS — IMO0001 Reserved for inherently not codable concepts without codable children: Secondary | ICD-10-CM | POA: Insufficient documentation

## 2013-10-14 DIAGNOSIS — E669 Obesity, unspecified: Secondary | ICD-10-CM | POA: Insufficient documentation

## 2013-10-14 DIAGNOSIS — M25569 Pain in unspecified knee: Secondary | ICD-10-CM | POA: Insufficient documentation

## 2013-10-14 DIAGNOSIS — G4730 Sleep apnea, unspecified: Secondary | ICD-10-CM | POA: Insufficient documentation

## 2013-10-14 DIAGNOSIS — J45909 Unspecified asthma, uncomplicated: Secondary | ICD-10-CM | POA: Insufficient documentation

## 2013-10-17 ENCOUNTER — Ambulatory Visit: Payer: No Typology Code available for payment source | Admitting: Rehabilitation

## 2013-10-22 ENCOUNTER — Ambulatory Visit: Payer: No Typology Code available for payment source | Admitting: Rehabilitation

## 2013-10-24 ENCOUNTER — Ambulatory Visit: Payer: No Typology Code available for payment source | Admitting: Rehabilitation

## 2013-10-28 ENCOUNTER — Ambulatory Visit: Payer: No Typology Code available for payment source | Admitting: Rehabilitation

## 2013-10-30 ENCOUNTER — Ambulatory Visit: Payer: No Typology Code available for payment source | Admitting: Rehabilitation

## 2013-11-03 ENCOUNTER — Ambulatory Visit: Payer: No Typology Code available for payment source | Attending: Family Medicine | Admitting: Rehabilitation

## 2013-11-03 DIAGNOSIS — R609 Edema, unspecified: Secondary | ICD-10-CM | POA: Insufficient documentation

## 2013-11-03 DIAGNOSIS — IMO0001 Reserved for inherently not codable concepts without codable children: Secondary | ICD-10-CM | POA: Insufficient documentation

## 2013-11-03 DIAGNOSIS — J45909 Unspecified asthma, uncomplicated: Secondary | ICD-10-CM | POA: Insufficient documentation

## 2013-11-03 DIAGNOSIS — G4730 Sleep apnea, unspecified: Secondary | ICD-10-CM | POA: Insufficient documentation

## 2013-11-03 DIAGNOSIS — E669 Obesity, unspecified: Secondary | ICD-10-CM | POA: Insufficient documentation

## 2013-11-03 DIAGNOSIS — M25569 Pain in unspecified knee: Secondary | ICD-10-CM | POA: Insufficient documentation

## 2013-11-06 ENCOUNTER — Ambulatory Visit: Payer: No Typology Code available for payment source | Admitting: Rehabilitation

## 2013-11-10 ENCOUNTER — Ambulatory Visit: Payer: No Typology Code available for payment source | Admitting: Rehabilitation

## 2013-11-13 ENCOUNTER — Ambulatory Visit: Payer: No Typology Code available for payment source | Admitting: Rehabilitation

## 2013-11-18 ENCOUNTER — Ambulatory Visit: Payer: No Typology Code available for payment source | Admitting: Rehabilitation

## 2013-11-20 ENCOUNTER — Ambulatory Visit: Payer: No Typology Code available for payment source | Admitting: Rehabilitation

## 2013-11-27 ENCOUNTER — Encounter: Payer: Self-pay | Admitting: Internal Medicine

## 2013-11-27 ENCOUNTER — Ambulatory Visit: Payer: No Typology Code available for payment source | Attending: Internal Medicine | Admitting: Internal Medicine

## 2013-11-27 VITALS — BP 141/94 | HR 79 | Temp 98.5°F | Resp 17 | Wt 331.2 lb

## 2013-11-27 DIAGNOSIS — J45909 Unspecified asthma, uncomplicated: Secondary | ICD-10-CM | POA: Insufficient documentation

## 2013-11-27 DIAGNOSIS — L0293 Carbuncle, unspecified: Secondary | ICD-10-CM

## 2013-11-27 DIAGNOSIS — A4902 Methicillin resistant Staphylococcus aureus infection, unspecified site: Secondary | ICD-10-CM | POA: Insufficient documentation

## 2013-11-27 DIAGNOSIS — F411 Generalized anxiety disorder: Secondary | ICD-10-CM | POA: Insufficient documentation

## 2013-11-27 DIAGNOSIS — F17200 Nicotine dependence, unspecified, uncomplicated: Secondary | ICD-10-CM | POA: Insufficient documentation

## 2013-11-27 DIAGNOSIS — K029 Dental caries, unspecified: Secondary | ICD-10-CM | POA: Insufficient documentation

## 2013-11-27 DIAGNOSIS — K089 Disorder of teeth and supporting structures, unspecified: Secondary | ICD-10-CM

## 2013-11-27 DIAGNOSIS — R011 Cardiac murmur, unspecified: Secondary | ICD-10-CM | POA: Insufficient documentation

## 2013-11-27 DIAGNOSIS — I10 Essential (primary) hypertension: Secondary | ICD-10-CM | POA: Insufficient documentation

## 2013-11-27 DIAGNOSIS — K0889 Other specified disorders of teeth and supporting structures: Secondary | ICD-10-CM

## 2013-11-27 DIAGNOSIS — L0292 Furuncle, unspecified: Secondary | ICD-10-CM

## 2013-11-27 DIAGNOSIS — E669 Obesity, unspecified: Secondary | ICD-10-CM | POA: Insufficient documentation

## 2013-11-27 MED ORDER — IBUPROFEN 800 MG PO TABS: 800.0000 mg | ORAL_TABLET | Freq: Three times a day (TID) | ORAL | Status: DC | PRN

## 2013-11-27 MED ORDER — AMOXICILLIN-POT CLAVULANATE 875-125 MG PO TABS: 1.0000 | ORAL_TABLET | Freq: Two times a day (BID) | ORAL | Status: DC

## 2013-11-27 NOTE — Progress Notes (Signed)
Patient complains of dental pain to the left side of her mouth for The past month.  Also complains of having a boil under her right arm

## 2013-11-27 NOTE — Progress Notes (Signed)
MRN: [id number] Name: Marissa Joseph  Sex: female Age: 33 y.o. DOB: [birth year]  Allergies: Shellfish allergy and Avocado  Chief Complaint  Patient presents with  . Dental Pain    HPI: Patient is 33 y.o. female who reported to have dental pain for the last one month, she has history of dental cavities and is per patient last here she saw a dentist, denies any fever chills but the symptoms are getting worse, she took ibuprofen which helped some, she also reported to have noticed a boil in her right underarm, she denies any discharge but she applied heating compress which helped her with the symptoms.   Past Medical History  Diagnosis Date  . Asthma   . Fibroids   . Hypertension   . Obesity   . Heart murmur   . Eczema   . MRSA (methicillin resistant staph aureus) culture positive 05/2012    cellulits/ left leg  . Anxiety     no official dx    Past Surgical History  Procedure Laterality Date  . No past surgeries        Medication List       This list is accurate as of: 11/27/13  9:56 AM.  Always use your most recent med list.               albuterol 108 (90 BASE) MCG/ACT inhaler  Commonly known as:  PROVENTIL HFA;VENTOLIN HFA  Inhale 2 puffs into the lungs every 6 (six) hours as needed for wheezing.     amoxicillin-clavulanate 875-125 MG per tablet  Commonly known as:  AUGMENTIN  Take 1 tablet by mouth 2 (two) times daily.     budesonide-formoterol 160-4.5 MCG/ACT inhaler  Commonly known as:  SYMBICORT  Inhale 2 puffs into the lungs 2 (two) times daily as needed (shortness of breath).     hydrochlorothiazide 25 MG tablet  Commonly known as:  HYDRODIURIL  Take 25 mg by mouth daily.     HYDROcodone-acetaminophen 5-325 MG per tablet  Commonly known as:  NORCO/VICODIN  Take 1-2 tablets by mouth every 6 (six) hours as needed for moderate pain or severe pain.     ibuprofen 800 MG tablet  Commonly known as:  ADVIL,MOTRIN  Take 1 tablet (800 mg total) by  mouth every 8 (eight) hours as needed. Take with food.     montelukast 10 MG tablet  Commonly known as:  SINGULAIR  Take 1 tablet (10 mg total) by mouth at bedtime.     potassium chloride SA 20 MEQ tablet  Commonly known as:  K-DUR,KLOR-CON  Take 0.5 tablets (10 mEq total) by mouth daily.     traMADol 50 MG tablet  Commonly known as:  ULTRAM  Take 50 mg by mouth every 6 (six) hours as needed for pain.     triamcinolone cream 0.1 %  Commonly known as:  KENALOG  Apply topically 2 (two) times daily.        Meds ordered this encounter  Medications  . amoxicillin-clavulanate (AUGMENTIN) 875-125 MG per tablet    Sig: Take 1 tablet by mouth 2 (two) times daily.    Dispense:  20 tablet    Refill:  0  . ibuprofen (ADVIL,MOTRIN) 800 MG tablet    Sig: Take 1 tablet (800 mg total) by mouth every 8 (eight) hours as needed. Take with food.    Dispense:  30 tablet    Refill:  1    Order Specific Question:  Supervising  Vedder Brittian    Answer:  Noemi Chapel D [[id number]]    Immunization History  Administered Date(s) Administered  . Influenza Split 06/04/2012  . Pneumococcal Polysaccharide-23 06/04/2012  . Td 03/24/2008    Family History  Problem Relation Age of Onset  . Diabetes Father   . Diabetes Mother   . Cancer Maternal Uncle     prostate  . Cancer Paternal Aunt     lung  . Heart disease Maternal Grandmother   . Diabetes Maternal Grandmother   . Stroke Maternal Grandfather     History  Substance Use Topics  . Smoking status: Current Every Day Smoker -- 0.25 packs/day for 1 years    Types: Cigarettes  . Smokeless tobacco: Never Used  . Alcohol Use: Yes     Comment: occ    Review of Systems   As noted in HPI  Filed Vitals:   11/27/13 0940  BP: 141/94  Pulse: 79  Temp: 98.5 F (36.9 C)  Resp: 17    Physical Exam  Physical Exam  Constitutional: No distress.  HENT:  Dental cavities molar teeth decay   Eyes: EOM are normal. Pupils are equal, round, and  reactive to light.  Cardiovascular: Normal rate and regular rhythm.   Pulmonary/Chest: Breath sounds normal. No respiratory distress. She has no wheezes. She has no rales.  Skin:  Localized boil on her right underarm no apparent discharge    CBC    Component Value Date/Time   WBC 15.7* 06/21/2013 0218   RBC 4.32 06/21/2013 0218   HGB 15.0 06/21/2013 0223   HCT 44.0 06/21/2013 0223   PLT 229 06/21/2013 0218   MCV 92.8 06/21/2013 0218   LYMPHSABS 0.8 12/27/2012 0518   MONOABS 0.4 12/27/2012 0518   EOSABS 0.0 12/27/2012 0518   BASOSABS 0.0 12/27/2012 0518    CMP     Component Value Date/Time   NA 140 06/21/2013 0223   K 3.7 06/21/2013 0223   CL 103 06/21/2013 0223   CO2 29 12/30/2012 0620   GLUCOSE 113* 06/21/2013 0223   BUN 13 06/21/2013 0223   CREATININE 1.00 06/21/2013 0223   CALCIUM 8.9 12/30/2012 0620   PROT 6.8 12/27/2012 0518   ALBUMIN 2.9* 12/27/2012 0518   AST 11 12/27/2012 0518   ALT 9 12/27/2012 0518   ALKPHOS 56 12/27/2012 0518   BILITOT 0.5 12/27/2012 0518   GFRNONAA 72* 12/30/2012 0620   GFRAA 84* 12/30/2012 0620    Lab Results  Component Value Date/Time   CHOL 124 03/24/2008 10:16 PM    No components found with this basename: hga1c    Lab Results  Component Value Date/Time   AST 11 12/27/2012  5:18 AM    Assessment and Plan  Pain, dental - Plan: amoxicillin-clavulanate (AUGMENTIN) 875-125 MG per tablet, ibuprofen (ADVIL,MOTRIN) 800 MG tablet, Ambulatory referral to Dentistry  Dental cavities - Plan: Ambulatory referral to Dentistry  Boil - Plan: amoxicillin-clavulanate (AUGMENTIN) 875-125 MG per tablet   Return if symptoms worsen or fail to improve.  Lorayne Marek, MD

## 2013-12-22 ENCOUNTER — Ambulatory Visit: Payer: No Typology Code available for payment source | Attending: Internal Medicine | Admitting: Internal Medicine

## 2013-12-22 ENCOUNTER — Encounter: Payer: Self-pay | Admitting: Internal Medicine

## 2013-12-22 VITALS — BP 138/95 | HR 61 | Temp 98.0°F | Resp 16 | Wt 324.4 lb

## 2013-12-22 DIAGNOSIS — Z0000 Encounter for general adult medical examination without abnormal findings: Secondary | ICD-10-CM

## 2013-12-22 DIAGNOSIS — H538 Other visual disturbances: Secondary | ICD-10-CM | POA: Insufficient documentation

## 2013-12-22 DIAGNOSIS — IMO0001 Reserved for inherently not codable concepts without codable children: Secondary | ICD-10-CM

## 2013-12-22 DIAGNOSIS — I10 Essential (primary) hypertension: Secondary | ICD-10-CM | POA: Insufficient documentation

## 2013-12-22 DIAGNOSIS — J45909 Unspecified asthma, uncomplicated: Secondary | ICD-10-CM | POA: Insufficient documentation

## 2013-12-22 DIAGNOSIS — R030 Elevated blood-pressure reading, without diagnosis of hypertension: Secondary | ICD-10-CM

## 2013-12-22 DIAGNOSIS — Z79899 Other long term (current) drug therapy: Secondary | ICD-10-CM | POA: Insufficient documentation

## 2013-12-22 DIAGNOSIS — F17200 Nicotine dependence, unspecified, uncomplicated: Secondary | ICD-10-CM | POA: Insufficient documentation

## 2013-12-22 DIAGNOSIS — L259 Unspecified contact dermatitis, unspecified cause: Secondary | ICD-10-CM | POA: Insufficient documentation

## 2013-12-22 DIAGNOSIS — E669 Obesity, unspecified: Secondary | ICD-10-CM | POA: Insufficient documentation

## 2013-12-22 LAB — COMPLETE METABOLIC PANEL WITH GFR
ALBUMIN: 3.6 g/dL (ref 3.5–5.2)
ALT: 9 U/L (ref 0–35)
AST: 13 U/L (ref 0–37)
Alkaline Phosphatase: 59 U/L (ref 39–117)
BUN: 16 mg/dL (ref 6–23)
CALCIUM: 8.9 mg/dL (ref 8.4–10.5)
CHLORIDE: 105 meq/L (ref 96–112)
CO2: 27 mEq/L (ref 19–32)
Creat: 0.86 mg/dL (ref 0.50–1.10)
GFR, Est African American: >89 mL/min
GFR, Est Non African American: >89 mL/min
Glucose, Bld: 88 mg/dL (ref 70–99)
POTASSIUM: 4.1 meq/L (ref 3.5–5.3)
Sodium: 138 mEq/L (ref 135–145)
Total Bilirubin: 0.5 mg/dL (ref 0.2–1.2)
Total Protein: 7.4 g/dL (ref 6.0–8.3)

## 2013-12-22 NOTE — Progress Notes (Signed)
Patient here for her annual physical.

## 2013-12-22 NOTE — Progress Notes (Signed)
Patient Demographics  Marissa Joseph, is a 33 y.o. female  KDT:[id number]  DXI:[id number]  DOB - Jun 09, 1980  CC:  Chief Complaint  Patient presents with  . Annual Exam       HPI: Marissa Joseph is a 33 y.o. female here today for annual physical examination. History of asthma and is on Symbicort and albuterol when necessary, also taking Singulair history she smokes cigarettes, I have advised patient to quit smoking, denies any acute symptoms, reported to have blurry vision on and off, she wears corrective glasses, patient has not had any recent blood work done, previous blood work noticed her blood sugar was elevated as per patient she is fasting today. Patient has No headache, No chest pain, No abdominal pain - No Nausea, No new weakness tingling or numbness, No Cough - SOB.  Allergies  Allergen Reactions  . Shellfish Allergy Hives, Shortness Of Breath and Swelling  . Avocado     Throat itching and swelling   Past Medical History  Diagnosis Date  . Asthma   . Fibroids   . Hypertension   . Obesity   . Heart murmur   . Eczema   . MRSA (methicillin resistant staph aureus) culture positive 05/2012    cellulits/ left leg  . Anxiety     no official dx   Current Outpatient Prescriptions on File Prior to Visit  Medication Sig Dispense Refill  . albuterol (PROVENTIL HFA;VENTOLIN HFA) 108 (90 BASE) MCG/ACT inhaler Inhale 2 puffs into the lungs every 6 (six) hours as needed for wheezing.  1 Inhaler  2  . amoxicillin-clavulanate (AUGMENTIN) 875-125 MG per tablet Take 1 tablet by mouth 2 (two) times daily.  20 tablet  0  . budesonide-formoterol (SYMBICORT) 160-4.5 MCG/ACT inhaler Inhale 2 puffs into the lungs 2 (two) times daily as needed (shortness of breath).  3 Inhaler  4  . hydrochlorothiazide (HYDRODIURIL) 25 MG tablet Take 25 mg by mouth daily.      Marland Kitchen HYDROcodone-acetaminophen (NORCO/VICODIN) 5-325 MG per tablet Take 1-2 tablets by mouth every 6 (six) hours as needed  for moderate pain or severe pain.  17 tablet  0  . ibuprofen (ADVIL,MOTRIN) 800 MG tablet Take 1 tablet (800 mg total) by mouth every 8 (eight) hours as needed. Take with food.  30 tablet  1  . montelukast (SINGULAIR) 10 MG tablet Take 1 tablet (10 mg total) by mouth at bedtime.  30 tablet  2  . potassium chloride SA (K-DUR,KLOR-CON) 20 MEQ tablet Take 0.5 tablets (10 mEq total) by mouth daily.  30 tablet  2  . traMADol (ULTRAM) 50 MG tablet Take 50 mg by mouth every 6 (six) hours as needed for pain.      Marland Kitchen triamcinolone cream (KENALOG) 0.1 % Apply topically 2 (two) times daily.  30 g  1   No current facility-administered medications on file prior to visit.   Family History  Problem Relation Age of Onset  . Diabetes Father   . Diabetes Mother   . Cancer Maternal Uncle     prostate  . Cancer Paternal Aunt     lung  . Heart disease Maternal Grandmother   . Diabetes Maternal Grandmother   . Stroke Maternal Grandfather    History   Social History  . Marital Status: Single    Spouse Name: N/A    Number of Children: N/A  . Years of Education: N/A   Occupational History  . Not on file.   Social History  Main Topics  . Smoking status: Current Every Day Smoker -- 0.25 packs/day for 1 years    Types: Cigarettes  . Smokeless tobacco: Never Used  . Alcohol Use: Yes     Comment: occ  . Drug Use: No  . Sexual Activity: Yes    Birth Control/ Protection: Condom   Other Topics Concern  . Not on file   Social History Narrative  . No narrative on file    Review of Systems: Constitutional: Negative for fever, chills, diaphoresis, activity change, appetite change and fatigue. HENT: Negative for ear pain, nosebleeds, congestion, facial swelling, rhinorrhea, neck pain, neck stiffness and ear discharge.  Eyes: Negative for pain, discharge, redness, itching and visual disturbance. Respiratory: Negative for cough, choking, chest tightness, shortness of breath, wheezing and stridor.    Cardiovascular: Negative for chest pain, palpitations and leg swelling. Gastrointestinal: Negative for abdominal distention. Genitourinary: Negative for dysuria, urgency, frequency, hematuria, flank pain, decreased urine volume, difficulty urinating and dyspareunia.  Musculoskeletal: Negative for back pain, joint swelling, arthralgia and gait problem. Neurological: Negative for dizziness, tremors, seizures, syncope, facial asymmetry, speech difficulty, weakness, light-headedness, numbness and headaches.  Hematological: Negative for adenopathy. Does not bruise/bleed easily. Psychiatric/Behavioral: Negative for hallucinations, behavioral problems, confusion, dysphoric mood, decreased concentration and agitation.    Objective:   Filed Vitals:   12/22/13 1118  BP: 138/95  Pulse: 61  Temp: 98 F (36.7 C)  Resp: 16    Physical Exam: Constitutional: Obese female sitting comfortably not in acute distress HENT: Normocephalic, atraumatic, External right and left ear normal. Oropharynx is clear and moist. Both TMs visualized not congested. Eyes: Conjunctivae and EOM are normal. PERRLA, no scleral icterus. Neck: Normal ROM. Neck supple. No JVD. No tracheal deviation. No thyromegaly. CVS: RRR, S1/S2 +, no murmurs, no gallops, no carotid bruit.  Pulmonary: Effort and breath sounds normal, no stridor, rhonchi, wheezes, rales.  Abdominal: Soft. BS +, no distension, tenderness, rebound or guarding.  Musculoskeletal: Normal range of motion. No edema and no tenderness.  Neuro: Alert. Normal reflexes, muscle tone coordination. No cranial nerve deficit. Skin: Skin is warm and dry. No rash noted. Not diaphoretic. No erythema. No pallor. Psychiatric: Normal mood and affect. Behavior, judgment, thought content normal.  Lab Results  Component Value Date   WBC 15.7* 06/21/2013   HGB 15.0 06/21/2013   HCT 44.0 06/21/2013   MCV 92.8 06/21/2013   PLT 229 06/21/2013   Lab Results  Component Value Date    CREATININE 1.00 06/21/2013   BUN 13 06/21/2013   NA 140 06/21/2013   K 3.7 06/21/2013   CL 103 06/21/2013   CO2 29 12/30/2012    Lab Results  Component Value Date   HGBA1C 5.6 08/20/2012   Lipid Panel     Component Value Date/Time   CHOL 124 03/24/2008 2216   TRIG 66 03/24/2008 2216   HDL 39* 03/24/2008 2216   CHOLHDL 3.2 Ratio 03/24/2008 2216   VLDL 13 03/24/2008 2216   LDLCALC 72 03/24/2008 2216       Assessment and plan:   1. Annual physical exam We'll do baseline fasting blood work.  - COMPLETE METABOLIC PANEL WITH GFR - TSH - Vit D  25 hydroxy (rtn osteoporosis monitoring)  2. Unspecified asthma(493.90) Continue with Symbicort, albuterol when necessary  3. Smoking Advise patient to quit smoking.  4. Elevated BP Blood pressure is borderline elevated, I have advised patient for DASH diet.  5. Obesity, unspecified Encouraged diet and exercise.  6. Blurry vision  -  Ambulatory referral to Ophthalmology       Health Maintenance  -Pap Smear: uptodate    Return in about 6 months (around 06/24/2014), or if symptoms worsen or fail to improve, for asthma.   Lorayne Marek, MD

## 2013-12-22 NOTE — Patient Instructions (Signed)
DASH Eating Plan  DASH stands for "Dietary Approaches to Stop Hypertension." The DASH eating plan is a healthy eating plan that has been shown to reduce high blood pressure (hypertension). Additional health benefits may include reducing the risk of type 2 diabetes mellitus, heart disease, and stroke. The DASH eating plan may also help with weight loss.  WHAT DO I NEED TO KNOW ABOUT THE DASH EATING PLAN?  For the DASH eating plan, you will follow these general guidelines:  · Choose foods with a percent daily value for sodium of less than 5% (as listed on the food label).  · Use salt-free seasonings or herbs instead of table salt or sea salt.  · Check with your health care provider or pharmacist before using salt substitutes.  · Eat lower-sodium products, often labeled as "lower sodium" or "no salt added."  · Eat fresh foods.  · Eat more vegetables, fruits, and low-fat dairy products.  · Choose whole grains. Look for the word "whole" as the first word in the ingredient list.  · Choose fish and skinless chicken or turkey more often than red meat. Limit fish, poultry, and meat to 6 oz (170 g) each day.  · Limit sweets, desserts, sugars, and sugary drinks.  · Choose heart-healthy fats.  · Limit cheese to 1 oz (28 g) per day.  · Eat more home-cooked food and less restaurant, buffet, and fast food.  · Limit fried foods.  · Cook foods using methods other than frying.  · Limit canned vegetables. If you do use them, rinse them well to decrease the sodium.  · When eating at a restaurant, ask that your food be prepared with less salt, or no salt if possible.  WHAT FOODS CAN I EAT?  Seek help from a dietitian for individual calorie needs.  Grains  Whole grain or whole wheat bread. Brown rice. Whole grain or whole wheat pasta. Quinoa, bulgur, and whole grain cereals. Low-sodium cereals. Corn or whole wheat flour tortillas. Whole grain cornbread. Whole grain crackers. Low-sodium crackers.  Vegetables  Fresh or frozen vegetables  (raw, steamed, roasted, or grilled). Low-sodium or reduced-sodium tomato and vegetable juices. Low-sodium or reduced-sodium tomato sauce and paste. Low-sodium or reduced-sodium canned vegetables.   Fruits  All fresh, canned (in natural juice), or frozen fruits.  Meat and Other Protein Products  Ground beef (85% or leaner), grass-fed beef, or beef trimmed of fat. Skinless chicken or turkey. Ground chicken or turkey. Pork trimmed of fat. All fish and seafood. Eggs. Dried beans, peas, or lentils. Unsalted nuts and seeds. Unsalted canned beans.  Dairy  Low-fat dairy products, such as skim or 1% milk, 2% or reduced-fat cheeses, low-fat ricotta or cottage cheese, or plain low-fat yogurt. Low-sodium or reduced-sodium cheeses.  Fats and Oils  Tub margarines without trans fats. Light or reduced-fat mayonnaise and salad dressings (reduced sodium). Avocado. Safflower, olive, or canola oils. Natural peanut or almond butter.  Other  Unsalted popcorn and pretzels.  The items listed above may not be a complete list of recommended foods or beverages. Contact your dietitian for more options.  WHAT FOODS ARE NOT RECOMMENDED?  Grains  White bread. White pasta. White rice. Refined cornbread. Bagels and croissants. Crackers that contain trans fat.  Vegetables  Creamed or fried vegetables. Vegetables in a cheese sauce. Regular canned vegetables. Regular canned tomato sauce and paste. Regular tomato and vegetable juices.  Fruits  Dried fruits. Canned fruit in light or heavy syrup. Fruit juice.  Meat and Other Protein   Products  Fatty cuts of meat. Ribs, chicken wings, bacon, sausage, bologna, salami, chitterlings, fatback, hot dogs, bratwurst, and packaged luncheon meats. Salted nuts and seeds. Canned beans with salt.  Dairy  Whole or 2% milk, cream, half-and-half, and cream cheese. Whole-fat or sweetened yogurt. Full-fat cheeses or blue cheese. Nondairy creamers and whipped toppings. Processed cheese, cheese spreads, or cheese  curds.  Condiments  Onion and garlic salt, seasoned salt, table salt, and sea salt. Canned and packaged gravies. Worcestershire sauce. Tartar sauce. Barbecue sauce. Teriyaki sauce. Soy sauce, including reduced sodium. Steak sauce. Fish sauce. Oyster sauce. Cocktail sauce. Horseradish. Ketchup and mustard. Meat flavorings and tenderizers. Bouillon cubes. Hot sauce. Tabasco sauce. Marinades. Taco seasonings. Relishes.  Fats and Oils  Butter, stick margarine, lard, shortening, ghee, and bacon fat. Coconut, palm kernel, or palm oils. Regular salad dressings.  Other  Pickles and olives. Salted popcorn and pretzels.  The items listed above may not be a complete list of foods and beverages to avoid. Contact your dietitian for more information.  WHERE CAN I FIND MORE INFORMATION?  National Heart, [employer], and [employer] Institute: www.nhlbi.nih.gov/health/health-topics/topics/dash/  Document Released: 05/11/2011 Document Revised: 05/27/2013 Document Reviewed: 03/26/2013  [employer]® Patient Information ©2015 [employer]. This information is not intended to replace advice given to you by your health care provider. Make sure you discuss any questions you have with your health care provider.

## 2013-12-23 ENCOUNTER — Telehealth: Payer: Self-pay | Admitting: *Deleted

## 2013-12-23 DIAGNOSIS — E559 Vitamin D deficiency, unspecified: Secondary | ICD-10-CM

## 2013-12-23 LAB — TSH: TSH: 1.214 u[IU]/mL (ref 0.350–4.500)

## 2013-12-23 LAB — VITAMIN D 25 HYDROXY (VIT D DEFICIENCY, FRACTURES): Vit D, 25-Hydroxy: 22 ng/mL — ABNORMAL LOW (ref 30–89)

## 2013-12-23 MED ORDER — VITAMIN D (ERGOCALCIFEROL) 1.25 MG (50000 UNIT) PO CAPS: 50000.0000 [IU] | ORAL_CAPSULE | ORAL | Status: DC

## 2013-12-23 NOTE — Telephone Encounter (Signed)
Pt is aware of her lab results and she will pick up her rx today.

## 2013-12-23 NOTE — Telephone Encounter (Signed)
Message copied by Joan Mayans on Tue Dec 23, 2013  9:50 AM ------      Message from: Lorayne Marek      Created: Tue Dec 23, 2013  9:24 AM       Blood work reviewed, noticed low vitamin D, call patient advise to start ergocalciferol 50,000 units once a week for the duration of  12 weeks.       ------

## 2013-12-24 ENCOUNTER — Telehealth: Payer: Self-pay

## 2013-12-24 NOTE — Telephone Encounter (Signed)
Message copied by Dorothe Pea on Wed Dec 24, 2013  9:59 AM ------      Message from: Lorayne Marek      Created: Tue Dec 23, 2013  9:24 AM       Blood work reviewed, noticed low vitamin D, call patient advise to start ergocalciferol 50,000 units once a week for the duration of  12 weeks.       ------

## 2014-01-27 ENCOUNTER — Encounter: Payer: Self-pay | Admitting: General Practice

## 2014-03-09 ENCOUNTER — Emergency Department (HOSPITAL_COMMUNITY)
Admission: EM | Admit: 2014-03-09 | Discharge: 2014-03-09 | Disposition: A | Discharge status: Home / Self Care | Payer: Self-pay | Attending: Emergency Medicine | Admitting: Emergency Medicine

## 2014-03-09 ENCOUNTER — Encounter (HOSPITAL_COMMUNITY): Payer: Self-pay | Admitting: Emergency Medicine

## 2014-03-09 DIAGNOSIS — Z7951 Long term (current) use of inhaled steroids: Secondary | ICD-10-CM | POA: Insufficient documentation

## 2014-03-09 DIAGNOSIS — Z791 Long term (current) use of non-steroidal anti-inflammatories (NSAID): Secondary | ICD-10-CM | POA: Insufficient documentation

## 2014-03-09 DIAGNOSIS — Z87448 Personal history of other diseases of urinary system: Secondary | ICD-10-CM | POA: Insufficient documentation

## 2014-03-09 DIAGNOSIS — L03115 Cellulitis of right lower limb: Secondary | ICD-10-CM | POA: Insufficient documentation

## 2014-03-09 DIAGNOSIS — J45909 Unspecified asthma, uncomplicated: Secondary | ICD-10-CM | POA: Insufficient documentation

## 2014-03-09 DIAGNOSIS — B9562 Methicillin resistant Staphylococcus aureus infection as the cause of diseases classified elsewhere: Secondary | ICD-10-CM | POA: Insufficient documentation

## 2014-03-09 DIAGNOSIS — Z79899 Other long term (current) drug therapy: Secondary | ICD-10-CM | POA: Insufficient documentation

## 2014-03-09 DIAGNOSIS — Z872 Personal history of diseases of the skin and subcutaneous tissue: Secondary | ICD-10-CM | POA: Insufficient documentation

## 2014-03-09 DIAGNOSIS — Z720 Tobacco use: Secondary | ICD-10-CM | POA: Insufficient documentation

## 2014-03-09 DIAGNOSIS — E669 Obesity, unspecified: Secondary | ICD-10-CM | POA: Insufficient documentation

## 2014-03-09 DIAGNOSIS — I10 Essential (primary) hypertension: Secondary | ICD-10-CM | POA: Insufficient documentation

## 2014-03-09 DIAGNOSIS — Z8659 Personal history of other mental and behavioral disorders: Secondary | ICD-10-CM | POA: Insufficient documentation

## 2014-03-09 DIAGNOSIS — L0390 Cellulitis, unspecified: Secondary | ICD-10-CM

## 2014-03-09 DIAGNOSIS — R011 Cardiac murmur, unspecified: Secondary | ICD-10-CM | POA: Insufficient documentation

## 2014-03-09 MED ORDER — TRAMADOL HCL 50 MG PO TABS: 50.0000 mg | ORAL_TABLET | Freq: Four times a day (QID) | ORAL | Status: DC | PRN

## 2014-03-09 MED ORDER — CEPHALEXIN 250 MG PO CAPS
500.0000 mg | ORAL_CAPSULE | Freq: Once | ORAL | Status: AC
Start: 2014-03-09 — End: 2014-03-09
  Administered 2014-03-09: 500 mg via ORAL
  Filled 2014-03-09: qty 2

## 2014-03-09 MED ORDER — SULFAMETHOXAZOLE-TRIMETHOPRIM 800-160 MG PO TABS: 114.0000 | ORAL_TABLET | Freq: Two times a day (BID) | ORAL | Status: DC

## 2014-03-09 MED ORDER — SULFAMETHOXAZOLE-TMP DS 800-160 MG PO TABS
1.0000 | ORAL_TABLET | Freq: Once | ORAL | Status: AC
  Administered 2014-03-09: 1 via ORAL
  Filled 2014-03-09: qty 1

## 2014-03-09 MED ORDER — CEPHALEXIN 500 MG PO CAPS: 500.0000 mg | ORAL_CAPSULE | Freq: Four times a day (QID) | ORAL | Status: DC

## 2014-03-09 MED ORDER — HYDROCODONE-ACETAMINOPHEN 5-325 MG PO TABS
2.0000 | ORAL_TABLET | Freq: Once | ORAL | Status: AC
  Administered 2014-03-09: 2 via ORAL
  Filled 2014-03-09: qty 2

## 2014-03-09 NOTE — Discharge Instructions (Signed)
It was our pleasure to provide your ER care today - we hope that you feel better.  Keep skin very clean/dry. Take antibiotics (keflex and bactrim) as prescribed. Take motrin as need. You may also take ultram as need for pain - no driving when taking. Follow up with primary care doctor in the next few days for recheck if symptoms fail to improve/resolve.   Return to ER if worse, new symptoms, spreading redness, high fevers, severe pain, weak/faint, other concern.  You were given pain medication in the ER - no driving for the next 4 hours.     Cellulitis Cellulitis is an infection of the skin and the tissue beneath it. The infected area is usually red and tender. Cellulitis occurs most often in the arms and lower legs.  CAUSES  Cellulitis is caused by bacteria that enter the skin through cracks or cuts in the skin. The most common types of bacteria that cause cellulitis are staphylococci and streptococci. SIGNS AND SYMPTOMS   Redness and warmth.  Swelling.  Tenderness or pain.  Fever. DIAGNOSIS  Your health care provider can usually determine what is wrong based on a physical exam. Blood tests may also be done. TREATMENT  Treatment usually involves taking an antibiotic medicine. HOME CARE INSTRUCTIONS   Take your antibiotic medicine as directed by your health care provider. Finish the antibiotic even if you start to feel better.  Keep the infected arm or leg elevated to reduce swelling.  Apply a warm cloth to the affected area up to 4 times per day to relieve pain.  Take medicines only as directed by your health care provider.  Keep all follow-up visits as directed by your health care provider. SEEK MEDICAL CARE IF:   You notice red streaks coming from the infected area.  Your red area gets larger or turns dark in color.  Your bone or joint underneath the infected area becomes painful after the skin has healed.  Your infection returns in the same area or another  area.  You notice a swollen bump in the infected area.  You develop new symptoms.  You have a fever. SEEK IMMEDIATE MEDICAL CARE IF:   You feel very sleepy.  You develop vomiting or diarrhea.  You have a general ill feeling (malaise) with muscle aches and pains. MAKE SURE YOU:   Understand these instructions.  Will watch your condition.  Will get help right away if you are not doing well or get worse. Document Released: 03/01/2005 Document Revised: 10/06/2013 Document Reviewed: 08/07/2011 [facility] Patient Information 2015 [place], [place]. This information is not intended to replace advice given to you by your health care provider. Make sure you discuss any questions you have with your health care provider.    MRSA Infection MRSA stands for methicillin-resistant Staphylococcus aureus. This type of infection is caused by Staphylococcus aureus bacteria that are no longer affected by the medicines used to kill them (drug resistant). Staphylococcus (staph) bacteria are normally found on the skin or in the nose of healthy people. In most cases, these bacteria do not cause infection. But if these resistant bacteria enter your body through a cut or sore, they can cause a serious infection on your skin or in other parts of your body. There is a slight chance that the staph on your skin or in your nose is MRSA. There are two types of MRSA infections:  Hospital-acquired MRSA is bacteria that you get in the hospital.  Community-acquired MRSA is bacteria that you  get somewhere other than in a hospital. RISK FACTORS Hospital-acquired MRSA is more common. You could be at risk for this infection if you are in the hospital and you:  Have surgery or a procedure.  Have an IV access or a catheter tube placed in your body.  Have weak resistance to germs (weakened immune system).  Are elderly.  Are on kidney dialysis. You could be at risk for community-acquired MRSA if you have a break in your  skin and come into contact with MRSA. This may happen if you:  Play sports where there is skin-to-skin contact.  Live in a crowded setting, like a dormitory or a [employer]  Share towels, razors, or sports equipment with other people. SYMPTOMS  Symptoms of hospital-acquired MRSA depend on where MRSA has spread. Symptoms may include:  Wound infection.  Skin infection.  Rash.  Pneumonia.  Fever and chills.  Difficulty breathing.  Chest pain. Community-acquired MRSA is most likely to start as a scratch or cut that becomes infected. Symptoms may include:  A pus-filled pimple.  A boil on your skin.  Pus draining from your skin.  A sore (abscess) under your skin or somewhere in your body.  Fever with or without chills. DIAGNOSIS  The diagnosis of MRSA is made by taking a sample from an infected area and sending it to a lab for testing. A lab technician can grow (culture) MRSA and check it under a microscope. The cultured MRSA can be tested to see which type of antibiotic medicine will work to treat it. Newer tests can identify MRSA more quickly by testing bacteria samples for MRSA genes. Your health care provider can diagnose MRSA using samples from:   Cuts or wounds in infected areas.  Nasal swabs.  Saliva or cough specimens from deep in the lungs (sputum).  Urine.  Blood. You may also have:  Imaging studies (such as X-ray or MRI) to check if the infection has spread to the lungs, bones, or joints.  A culture and sensitivity test of blood or fluids from inside the joints. TREATMENT  Treatment depends on how severe, deep, or extensive the infection is. Very bad infections may require a hospital stay.  Some skin infections, such as a small boil or sore (abscess), may be treated by draining pus from the site of the infection.  More extensive surgery to drain pus may be necessary for deeper or more widespread soft tissue infections.  You may then have to take  antibiotic medicine given by mouth or through a vein. You may start antibiotic treatment right away or after testing can be done to see what antibiotic medicine should be used. HOME CARE INSTRUCTIONS   Take your antibiotics as directed by your health care provider. Take the medicine as prescribed until it is finished.  Avoid close contact with those around you as much as possible. Do not use towels, razors, toothbrushes, bedding, or other items that will be used by others.  Wash your hands frequently for 15 seconds with soap and water. Dry your hands with a clean or disposable towel.  When you are not able to wash your hands, use hand sanitizer that is more than 60 percent alcohol.  Wash towels, sheets, or clothes in the washing machine with detergent and hot water. Dry them in a hot dryer.  Follow your health care provider's instructions for wound care. Wash your hands before and after changing your bandages.  Always shower after exercising.  Keep all cuts and  scrapes clean and covered with a bandage.  Be sure to tell all your health care providers that you have MRSA so they are aware of your infection. SEEK MEDICAL CARE IF:  You have a cut, scrape, pimple, or boil that becomes red, swollen, or painful or has pus in it.  You have pus draining from your skin.  You have an abscess under your skin or somewhere in your body. SEEK IMMEDIATE MEDICAL CARE IF:   You have symptoms of a skin infection with a fever or chills.  You have trouble breathing.  You have chest pain.  You have a skin wound and you become nauseous or start vomiting. MAKE SURE YOU:  Understand these instructions.  Will watch your condition.  Will get help right away if you are not doing well or get worse. Document Released: 05/22/2005 Document Revised: 05/27/2013 Document Reviewed: 03/14/2013 [facility] Patient Information 2015 [place], [place]. This information is not intended to replace advice given to you by  your health care provider. Make sure you discuss any questions you have with your health care provider.

## 2014-03-09 NOTE — ED Provider Notes (Addendum)
CSN: [id number]     Arrival date & time 03/09/14  9326 History   First MD Initiated Contact with Patient 03/09/14 [phone]     Chief Complaint  Patient presents with  . Leg Pain     (Consider location/radiation/quality/duration/timing/severity/associated sxs/prior Treatment) Patient is a 33 y.o. female presenting with leg pain. The history is provided by the patient.  Leg Pain Associated symptoms: no back pain, no fever and no neck pain   pt c/o area redness, increased warmth to right lower leg.  Constant. Mild, non radiating. States hx mrsa/cellulitis in legs previously.  Has a couple circular mildly tender lesions c/w mrsa as well, however no fluctuance/abscess.  Pt denies fever or chills. No nv. States does not feel ill or sick.  No hx diabetes. No trauma to legs. No calf pain or swelling.     Past Medical History  Diagnosis Date  . Asthma   . Fibroids   . Hypertension   . Obesity   . Heart murmur   . Eczema   . MRSA (methicillin resistant staph aureus) culture positive 05/2012    cellulits/ left leg  . Anxiety     no official dx   Past Surgical History  Procedure Laterality Date  . No past surgeries     Family History  Problem Relation Age of Onset  . Diabetes Father   . Diabetes Mother   . Cancer Maternal Uncle     prostate  . Cancer Paternal Aunt     lung  . Heart disease Maternal Grandmother   . Diabetes Maternal Grandmother   . Stroke Maternal Grandfather    History  Substance Use Topics  . Smoking status: Current Every Day Smoker -- 0.25 packs/day for 1 years    Types: Cigarettes  . Smokeless tobacco: Never Used  . Alcohol Use: Yes     Comment: occ   OB History   Grav Para Term Preterm Abortions TAB SAB Ect Mult Living   0              Review of Systems  Constitutional: Negative for fever, chills and diaphoresis.  HENT: Negative for sore throat.   Eyes: Negative for redness.  Respiratory: Negative for shortness of breath.   Cardiovascular: Negative  for chest pain.  Gastrointestinal: Negative for nausea, vomiting and abdominal pain.  Endocrine: Negative for polyuria.  Genitourinary: Negative for flank pain.  Musculoskeletal: Negative for back pain and neck pain.  Skin: Negative for wound.  Neurological: Negative for headaches.  Hematological: Does not bruise/bleed easily.  Psychiatric/Behavioral: Negative for confusion.      Allergies  Avocado and Shellfish allergy  Home Medications   Prior to Admission medications   Medication Sig Start Date End Date Taking? Authorizing Provider  albuterol (PROVENTIL HFA;VENTOLIN HFA) 108 (90 BASE) MCG/ACT inhaler Inhale 2 puffs into the lungs every 6 (six) hours as needed for wheezing. 04/03/13  Yes Shanker Kristeen Mans, MD  budesonide-formoterol (SYMBICORT) 160-4.5 MCG/ACT inhaler Inhale 2 puffs into the lungs 2 (two) times daily as needed (shortness of breath). 07/10/13  Yes Tresa Garter, MD  hydrochlorothiazide (HYDRODIURIL) 25 MG tablet Take 25 mg by mouth daily. 04/03/13  Yes Shanker Kristeen Mans, MD  HYDROcodone-acetaminophen (NORCO/VICODIN) 5-325 MG per tablet Take 1-2 tablets by mouth every 6 (six) hours as needed for moderate pain or severe pain. 06/21/13  Yes Antonietta Breach, PA-C  ibuprofen (ADVIL,MOTRIN) 800 MG tablet Take 800 mg by mouth every 8 (eight) hours as needed for moderate  pain.   Yes Historical Provider, MD  montelukast (SINGULAIR) 10 MG tablet Take 1 tablet (10 mg total) by mouth at bedtime. 04/03/13  Yes Shanker Kristeen Mans, MD  triamcinolone cream (KENALOG) 0.1 % Apply 1 application topically 2 (two) times daily.   Yes Historical Provider, MD   BP 130/83  Pulse 74  Temp(Src) 98.1 F (36.7 C)  Resp 18  SpO2 99% Physical Exam  Nursing note and vitals reviewed. Constitutional: She appears well-developed and well-nourished. No distress.  HENT:  Mouth/Throat: Oropharynx is clear and moist.  Eyes: Conjunctivae are normal. No scleral icterus.  Neck: Neck supple. No tracheal  deviation present.  Cardiovascular: Normal rate, regular rhythm, normal heart sounds and intact distal pulses.   Pulmonary/Chest: Effort normal and breath sounds normal. No respiratory distress.  Abdominal: Soft. Normal appearance. She exhibits no distension. There is no tenderness.  obese  Musculoskeletal:  Few skin lesions bil lower legs felt c/w mrsa.  Area to right lower leg posterior/medially w erythema and increased warmth c/w cellulitis. No areas of fluctuance or abscess requiring I and D.  Distal pulses palp. No calf tenderness.   Neurological: She is alert.  Skin: Skin is warm and dry. No rash noted.  Psychiatric: She has a normal mood and affect.    ED Course  Procedures (including critical care time) Labs Review   MDM   Reviewed nursing notes and prior charts for additional history.   Exam c/w mild/early cellulitis.  Pt also w hx mrsa/mrsa cellulitis.  On review prior charts, appears responded well in past to rx bactrim and keflex.  Confirmed nkda w pt.    Pt also noted to have a few prior vascular doppler studies of legs negative for dvt.   No drainable abscess.  Pt states did not drive here, has ride home.  No meds for pain pta. vicodin po.    Pt afeb. Vitals normal.  Appears stable for d/c.     1  Mirna Mires, MD 03/09/14 [phone]

## 2014-03-09 NOTE — ED Notes (Signed)
Per pt sts possible cellulitis to right leg. sts recently treated for same in left leg. sts red swollen and painful.

## 2014-03-13 NOTE — Discharge Planning (Signed)
[facility] was not able to see patient, [facility] orange card information and resources will be sent to the address provided.

## 2014-04-13 ENCOUNTER — Ambulatory Visit: Payer: Self-pay | Attending: Internal Medicine

## 2015-03-29 ENCOUNTER — Encounter (HOSPITAL_COMMUNITY): Payer: Self-pay | Admitting: Emergency Medicine

## 2015-03-29 ENCOUNTER — Emergency Department (HOSPITAL_COMMUNITY): Payer: No Typology Code available for payment source

## 2015-03-29 ENCOUNTER — Emergency Department (HOSPITAL_COMMUNITY)
Admission: EM | Admit: 2015-03-29 | Discharge: 2015-03-29 | Disposition: A | Discharge status: Home / Self Care | Payer: No Typology Code available for payment source | Attending: Emergency Medicine | Admitting: Emergency Medicine

## 2015-03-29 DIAGNOSIS — E669 Obesity, unspecified: Secondary | ICD-10-CM | POA: Diagnosis not present

## 2015-03-29 DIAGNOSIS — Z872 Personal history of diseases of the skin and subcutaneous tissue: Secondary | ICD-10-CM | POA: Insufficient documentation

## 2015-03-29 DIAGNOSIS — J45909 Unspecified asthma, uncomplicated: Secondary | ICD-10-CM | POA: Diagnosis present

## 2015-03-29 DIAGNOSIS — I10 Essential (primary) hypertension: Secondary | ICD-10-CM | POA: Diagnosis not present

## 2015-03-29 DIAGNOSIS — Z8659 Personal history of other mental and behavioral disorders: Secondary | ICD-10-CM | POA: Insufficient documentation

## 2015-03-29 DIAGNOSIS — Z86018 Personal history of other benign neoplasm: Secondary | ICD-10-CM | POA: Diagnosis not present

## 2015-03-29 DIAGNOSIS — Z720 Tobacco use: Secondary | ICD-10-CM | POA: Insufficient documentation

## 2015-03-29 DIAGNOSIS — J4521 Mild intermittent asthma with (acute) exacerbation: Secondary | ICD-10-CM | POA: Insufficient documentation

## 2015-03-29 DIAGNOSIS — Z79899 Other long term (current) drug therapy: Secondary | ICD-10-CM | POA: Insufficient documentation

## 2015-03-29 DIAGNOSIS — R011 Cardiac murmur, unspecified: Secondary | ICD-10-CM | POA: Insufficient documentation

## 2015-03-29 MED ORDER — IPRATROPIUM-ALBUTEROL 0.5-2.5 (3) MG/3ML IN SOLN
RESPIRATORY_TRACT | Status: AC
  Filled 2015-03-29: qty 3

## 2015-03-29 MED ORDER — PREDNISONE 20 MG PO TABS: 40.0000 mg | ORAL_TABLET | Freq: Every day | ORAL | Status: DC

## 2015-03-29 MED ORDER — HYDROCHLOROTHIAZIDE 25 MG PO TABS: 25.0000 mg | ORAL_TABLET | Freq: Every day | ORAL | Status: DC

## 2015-03-29 MED ORDER — IPRATROPIUM-ALBUTEROL 0.5-2.5 (3) MG/3ML IN SOLN
3.0000 mL | Freq: Once | RESPIRATORY_TRACT | Status: AC
  Administered 2015-03-29: 3 mL via RESPIRATORY_TRACT

## 2015-03-29 MED ORDER — MONTELUKAST SODIUM 10 MG PO TABS: 10.0000 mg | ORAL_TABLET | Freq: Every day | ORAL | Status: DC

## 2015-03-29 MED ORDER — DEXAMETHASONE SODIUM PHOSPHATE 10 MG/ML IJ SOLN
10.0000 mg | Freq: Once | INTRAMUSCULAR | Status: AC
  Administered 2015-03-29: 10 mg via INTRAMUSCULAR
  Filled 2015-03-29: qty 1

## 2015-03-29 MED ORDER — BUDESONIDE-FORMOTEROL FUMARATE 160-4.5 MCG/ACT IN AERO: 2.0000 | INHALATION_SPRAY | Freq: Two times a day (BID) | RESPIRATORY_TRACT | Status: DC | PRN

## 2015-03-29 NOTE — Discharge Instructions (Signed)
Asthma, Adult Asthma is a recurring condition in which the airways tighten and narrow. Asthma can make it difficult to breathe. It can cause coughing, wheezing, and shortness of breath. Asthma episodes, also called asthma attacks, range from minor to life-threatening. Asthma cannot be cured, but medicines and lifestyle changes can help control it. CAUSES Asthma is believed to be caused by inherited (genetic) and environmental factors, but its exact cause is unknown. Asthma may be triggered by allergens, lung infections, or irritants in the air. Asthma triggers are different for each person. Common triggers include:   Animal dander.  Dust mites.  Cockroaches.  Pollen from trees or grass.  Mold.  Smoke.  Air pollutants such as dust, household cleaners, hair sprays, aerosol sprays, paint fumes, strong chemicals, or strong odors.  Cold air, weather changes, and winds (which increase molds and pollens in the air).  Strong emotional expressions such as crying or laughing hard.  Stress.  Certain medicines (such as aspirin) or types of drugs (such as beta-blockers).  Sulfites in foods and drinks. Foods and drinks that may contain sulfites include dried fruit, potato chips, and sparkling grape juice.  Infections or inflammatory conditions such as the flu, a cold, or an inflammation of the nasal membranes (rhinitis).  Gastroesophageal reflux disease (GERD).  Exercise or strenuous activity. SYMPTOMS Symptoms may occur immediately after asthma is triggered or many hours later. Symptoms include:  Wheezing.  Excessive nighttime or early morning coughing.  Frequent or severe coughing with a common cold.  Chest tightness.  Shortness of breath. DIAGNOSIS  The diagnosis of asthma is made by a review of your medical history and a physical exam. Tests may also be performed. These may include:  Lung function studies. These tests show how much air you breathe in and out.  Allergy  tests.  Imaging tests such as X-rays. TREATMENT  Asthma cannot be cured, but it can usually be controlled. Treatment involves identifying and avoiding your asthma triggers. It also involves medicines. There are 2 classes of medicine used for asthma treatment:   Controller medicines. These prevent asthma symptoms from occurring. They are usually taken every day.  Reliever or rescue medicines. These quickly relieve asthma symptoms. They are used as needed and provide short-term relief. Your health care provider will help you create an asthma action plan. An asthma action plan is a written plan for managing and treating your asthma attacks. It includes a list of your asthma triggers and how they may be avoided. It also includes information on when medicines should be taken and when their dosage should be changed. An action plan may also involve the use of a device called a peak flow meter. A peak flow meter measures how well the lungs are working. It helps you monitor your condition. HOME CARE INSTRUCTIONS   Take medicines only as directed by your health care provider. Speak with your health care provider if you have questions about how or when to take the medicines.  Use a peak flow meter as directed by your health care provider. Record and keep track of readings.  Understand and use the action plan to help minimize or stop an asthma attack without needing to seek medical care.  Control your home environment in the following ways to help prevent asthma attacks:  Do not smoke. Avoid being exposed to secondhand smoke.  Change your heating and air conditioning filter regularly.  Limit your use of fireplaces and wood stoves.  Get rid of pests (such as roaches   and mice) and their droppings.  Throw away plants if you see mold on them.  Clean your floors and dust regularly. Use unscented cleaning products.  Try to have someone else vacuum for you regularly. Stay out of rooms while they are  being vacuumed and for a short while afterward. If you vacuum, use a dust mask from a hardware store, a double-layered or microfilter vacuum cleaner bag, or a vacuum cleaner with a HEPA filter.  Replace carpet with wood, tile, or vinyl flooring. Carpet can trap dander and dust.  Use allergy-proof pillows, mattress covers, and box spring covers.  Wash bed sheets and blankets every week in hot water and dry them in a dryer.  Use blankets that are made of polyester or cotton.  Clean bathrooms and kitchens with bleach. If possible, have someone repaint the walls in these rooms with mold-resistant paint. Keep out of the rooms that are being cleaned and painted.  Wash hands frequently. SEEK MEDICAL CARE IF:   You have wheezing, shortness of breath, or a cough even if taking medicine to prevent attacks.  The colored mucus you cough up (sputum) is thicker than usual.  Your sputum changes from clear or white to yellow, green, gray, or bloody.  You have any problems that may be related to the medicines you are taking (such as a rash, itching, swelling, or trouble breathing).  You are using a reliever medicine more than 2-3 times per week.  Your peak flow is still at 50-79% of your personal best after following your action plan for 1 hour.  You have a fever. SEEK IMMEDIATE MEDICAL CARE IF:   You seem to be getting worse and are unresponsive to treatment during an asthma attack.  You are short of breath even at rest.  You get short of breath when doing very little physical activity.  You have difficulty eating, drinking, or talking due to asthma symptoms.  You develop chest pain.  You develop a fast heartbeat.  You have a bluish color to your lips or fingernails.  You are light-headed, dizzy, or faint.  Your peak flow is less than 50% of your personal best.   This information is not intended to replace advice given to you by your health care provider. Make sure you discuss any  questions you have with your health care provider.   Document Released: 05/22/2005 Document Revised: 02/10/2015 Document Reviewed: 12/19/2012 Elsevier Interactive Patient Education 2016 [employer]  

## 2015-03-29 NOTE — ED Provider Notes (Signed)
CSN: [id number]     Arrival date & time 03/29/15  1660 History   First MD Initiated Contact with Patient 03/29/15 [phone]     Chief Complaint  Patient presents with  . Asthma     (Consider location/radiation/quality/duration/timing/severity/associated sxs/prior Treatment) HPI   Marissa Joseph This 34 year old female with a past medical history of asthma who presents with chief complaint of asthma exacerbation. Patient states that she's had worsening symptoms over the past few days. She denies any recent upper respiratory infections. She states that she burnt popcorn in her home yesterday and feels the fumes set her off. She's had excessive coughing, worse at night. Patient has albuterol at home but is out of her Singulair and Symbicort which she normally takes to control her symptoms. She has never been hospitalized or intubated for her asthma. She denies fevers, productive cough, chest pain.   Past Medical History  Diagnosis Date  . Asthma   . Fibroids   . Hypertension   . Obesity   . Heart murmur   . Eczema   . MRSA (methicillin resistant staph aureus) culture positive 05/2012    cellulits/ left leg  . Anxiety     no official dx   Past Surgical History  Procedure Laterality Date  . No past surgeries     Family History  Problem Relation Age of Onset  . Diabetes Father   . Diabetes Mother   . Cancer Maternal Uncle     prostate  . Cancer Paternal Aunt     lung  . Heart disease Maternal Grandmother   . Diabetes Maternal Grandmother   . Stroke Maternal Grandfather    Social History  Substance Use Topics  . Smoking status: Current Every Day Smoker -- 0.25 packs/day for 1 years    Types: Cigarettes  . Smokeless tobacco: Never Used  . Alcohol Use: Yes     Comment: occ   OB History    Gravida Para Term Preterm AB TAB SAB Ectopic Multiple Living   0              Review of Systems  Ten systems reviewed and are negative for acute change, except as noted in the  HPI.    Allergies  Avocado and Shellfish allergy  Home Medications   Prior to Admission medications   Medication Sig Start Date End Date Taking? Authorizing Provider  albuterol (PROVENTIL HFA;VENTOLIN HFA) 108 (90 BASE) MCG/ACT inhaler Inhale 2 puffs into the lungs every 6 (six) hours as needed for wheezing. 04/03/13  Yes Shanker Kristeen Mans, MD  hydrochlorothiazide (HYDRODIURIL) 25 MG tablet Take 25 mg by mouth daily. 04/03/13  Yes Shanker Kristeen Mans, MD  ibuprofen (ADVIL,MOTRIN) 200 MG tablet Take 800 mg by mouth every 6 (six) hours as needed for moderate pain.   Yes Historical Provider, MD  montelukast (SINGULAIR) 10 MG tablet Take 1 tablet (10 mg total) by mouth at bedtime. 04/03/13  Yes Shanker Kristeen Mans, MD  budesonide-formoterol (SYMBICORT) 160-4.5 MCG/ACT inhaler Inhale 2 puffs into the lungs 2 (two) times daily as needed (shortness of breath). Patient not taking: Reported on 03/29/2015 07/10/13   Tresa Garter, MD   BP 141/57 mmHg  Pulse 77  Temp(Src) 98.3 F (36.8 C) (Oral)  Resp 22  SpO2 100%  LMP 03/23/2015 Physical Exam  Constitutional: She is oriented to person, place, and time. She appears well-developed and well-nourished. No distress.  HENT:  Head: Normocephalic and atraumatic.  Eyes: Conjunctivae are normal. No scleral  icterus.  Neck: Normal range of motion.  Cardiovascular: Normal rate, regular rhythm and normal heart sounds.  Exam reveals no gallop and no friction rub.   No murmur heard. Pulmonary/Chest: Effort normal. No respiratory distress.  Tight cough with mild expiratory wheeze  Abdominal: Soft. Bowel sounds are normal. She exhibits no distension and no mass. There is no tenderness. There is no guarding.  Neurological: She is alert and oriented to person, place, and time.  Skin: Skin is warm and dry. She is not diaphoretic.  Nursing note and vitals reviewed.   ED Course  Procedures (including critical care time) Labs Review Labs Reviewed - No data  to display  Imaging Review Dg Chest 2 View  03/29/2015  CLINICAL DATA:  Cough, congestion, shortness of breath, wheezing and chest tightness. EXAM: CHEST  2 VIEW COMPARISON:  PA and lateral chest 11/11/2012. FINDINGS: Heart size and mediastinal contours are within normal limits. Both lungs are clear. Visualized skeletal structures are unremarkable. IMPRESSION: Negative exam. Electronically Signed   By: Inge Rise M.D.   On: 03/29/2015 09:17   I have personally reviewed and evaluated these images and lab results as part of my medical decision-making.   EKG Interpretation None      MDM   Final diagnoses:  Asthma, mild intermittent, with acute exacerbation    Patient here with asthma exacerbation, given albuterol treatment, Decadron. Improved. Patient discharged with prednisone, refill her normal medications. She is hypertensive today. Chest x-ray is without acute abnormality. Follow up with primary care physician.    Margarita Mail, PA-C 03/29/15 [place], MD 03/30/15 [phone]

## 2015-03-29 NOTE — ED Notes (Signed)
Per pt, states URI has exacerbated asthma symptoms-going on since Friday-cough, chest tight-inhalers are not working

## 2015-03-29 NOTE — ED Notes (Signed)
Pt walked once around the entire ED floor, O2 stats were from 96% to 99% heart rate was from 96 to 100 .

## 2015-04-12 ENCOUNTER — Encounter (HOSPITAL_COMMUNITY): Payer: Self-pay | Admitting: Emergency Medicine

## 2015-04-12 ENCOUNTER — Emergency Department (HOSPITAL_COMMUNITY)
Admission: EM | Admit: 2015-04-12 | Discharge: 2015-04-12 | Discharge status: Against Medical Advice | Payer: No Typology Code available for payment source | Attending: Emergency Medicine | Admitting: Emergency Medicine

## 2015-04-12 DIAGNOSIS — J45909 Unspecified asthma, uncomplicated: Secondary | ICD-10-CM | POA: Insufficient documentation

## 2015-04-12 DIAGNOSIS — R21 Rash and other nonspecific skin eruption: Secondary | ICD-10-CM | POA: Diagnosis present

## 2015-04-12 DIAGNOSIS — E669 Obesity, unspecified: Secondary | ICD-10-CM | POA: Diagnosis not present

## 2015-04-12 DIAGNOSIS — I10 Essential (primary) hypertension: Secondary | ICD-10-CM | POA: Insufficient documentation

## 2015-04-12 DIAGNOSIS — R011 Cardiac murmur, unspecified: Secondary | ICD-10-CM | POA: Insufficient documentation

## 2015-04-12 DIAGNOSIS — Z720 Tobacco use: Secondary | ICD-10-CM | POA: Insufficient documentation

## 2015-04-12 NOTE — ED Notes (Signed)
Per pt, states right lower extremity edema with rash-states some bumps are leaking

## 2015-06-22 ENCOUNTER — Encounter (HOSPITAL_COMMUNITY): Payer: Self-pay | Admitting: Emergency Medicine

## 2015-06-22 ENCOUNTER — Emergency Department (HOSPITAL_COMMUNITY)
Admission: EM | Admit: 2015-06-22 | Discharge: 2015-06-22 | Disposition: A | Discharge status: Home / Self Care | Payer: BLUE CROSS/BLUE SHIELD | Attending: Emergency Medicine | Admitting: Emergency Medicine

## 2015-06-22 DIAGNOSIS — Z86018 Personal history of other benign neoplasm: Secondary | ICD-10-CM | POA: Diagnosis not present

## 2015-06-22 DIAGNOSIS — L03115 Cellulitis of right lower limb: Secondary | ICD-10-CM | POA: Insufficient documentation

## 2015-06-22 DIAGNOSIS — I10 Essential (primary) hypertension: Secondary | ICD-10-CM | POA: Insufficient documentation

## 2015-06-22 DIAGNOSIS — R21 Rash and other nonspecific skin eruption: Secondary | ICD-10-CM

## 2015-06-22 DIAGNOSIS — F17210 Nicotine dependence, cigarettes, uncomplicated: Secondary | ICD-10-CM | POA: Insufficient documentation

## 2015-06-22 DIAGNOSIS — Z7952 Long term (current) use of systemic steroids: Secondary | ICD-10-CM | POA: Diagnosis not present

## 2015-06-22 DIAGNOSIS — Z79899 Other long term (current) drug therapy: Secondary | ICD-10-CM | POA: Diagnosis not present

## 2015-06-22 DIAGNOSIS — E669 Obesity, unspecified: Secondary | ICD-10-CM | POA: Insufficient documentation

## 2015-06-22 DIAGNOSIS — J45909 Unspecified asthma, uncomplicated: Secondary | ICD-10-CM | POA: Insufficient documentation

## 2015-06-22 DIAGNOSIS — Z8659 Personal history of other mental and behavioral disorders: Secondary | ICD-10-CM | POA: Diagnosis not present

## 2015-06-22 DIAGNOSIS — R011 Cardiac murmur, unspecified: Secondary | ICD-10-CM | POA: Diagnosis not present

## 2015-06-22 DIAGNOSIS — Z8614 Personal history of Methicillin resistant Staphylococcus aureus infection: Secondary | ICD-10-CM | POA: Insufficient documentation

## 2015-06-22 LAB — URINALYSIS, ROUTINE W REFLEX MICROSCOPIC
BILIRUBIN URINE: NEGATIVE
Glucose, UA: NEGATIVE mg/dL
Hgb urine dipstick: NEGATIVE
Ketones, ur: 15 mg/dL — AB
NITRITE: NEGATIVE
PROTEIN: NEGATIVE mg/dL
SPECIFIC GRAVITY, URINE: 1.03 (ref 1.005–1.030)
pH: 6 (ref 5.0–8.0)

## 2015-06-22 LAB — URINE MICROSCOPIC-ADD ON

## 2015-06-22 LAB — BASIC METABOLIC PANEL
Anion gap: 10 (ref 5–15)
BUN: 17 mg/dL (ref 6–20)
CHLORIDE: 105 mmol/L (ref 101–111)
CO2: 26 mmol/L (ref 22–32)
CREATININE: 0.85 mg/dL (ref 0.44–1.00)
Calcium: 9 mg/dL (ref 8.9–10.3)
GFR calc non Af Amer: >60 mL/min (ref >60)
GLUCOSE: 94 mg/dL (ref 65–99)
Potassium: 3.7 mmol/L (ref 3.5–5.1)
Sodium: 141 mmol/L (ref 135–145)

## 2015-06-22 LAB — I-STAT CG4 LACTIC ACID, ED: Lactic Acid, Venous: 0.57 mmol/L (ref 0.5–2.0)

## 2015-06-22 LAB — CBC
HCT: 40.5 % (ref 36.0–46.0)
HEMOGLOBIN: 13.4 g/dL (ref 12.0–15.0)
MCH: 30.9 pg (ref 26.0–34.0)
MCHC: 33.1 g/dL (ref 30.0–36.0)
MCV: 93.3 fL (ref 78.0–100.0)
Platelets: 210 10*3/uL (ref 150–400)
RBC: 4.34 MIL/uL (ref 3.87–5.11)
RDW: 12.7 % (ref 11.5–15.5)
WBC: 6.5 10*3/uL (ref 4.0–10.5)

## 2015-06-22 LAB — CBG MONITORING, ED: GLUCOSE-CAPILLARY: 84 mg/dL (ref 65–99)

## 2015-06-22 MED ORDER — DOXYCYCLINE HYCLATE 100 MG PO CAPS: 100.0000 mg | ORAL_CAPSULE | Freq: Two times a day (BID) | ORAL | Status: DC

## 2015-06-22 MED ORDER — CLOTRIMAZOLE 1 % EX CREA: TOPICAL_CREAM | CUTANEOUS | Status: DC

## 2015-06-22 MED ORDER — DOXYCYCLINE HYCLATE 100 MG PO TABS
100.0000 mg | ORAL_TABLET | Freq: Once | ORAL | Status: AC
  Administered 2015-06-22: 100 mg via ORAL
  Filled 2015-06-22: qty 1

## 2015-06-22 MED ORDER — HYDROCODONE-ACETAMINOPHEN 5-325 MG PO TABS
1.0000 | ORAL_TABLET | Freq: Once | ORAL | Status: AC
  Administered 2015-06-22: 1 via ORAL
  Filled 2015-06-22: qty 1

## 2015-06-22 MED ORDER — CLOTRIMAZOLE 1 % EX CREA
TOPICAL_CREAM | Freq: Two times a day (BID) | CUTANEOUS | Status: DC
  Administered 2015-06-22: 21:00:00 via TOPICAL
  Filled 2015-06-22 (×2): qty 15

## 2015-06-22 NOTE — ED Notes (Signed)
Waiting on medication from pharmacy; pharmacy made aware

## 2015-06-22 NOTE — ED Notes (Signed)
Contacted pharmacy about missing dose.

## 2015-06-22 NOTE — ED Provider Notes (Signed)
CSN: [id number]     Arrival date & time 06/22/15  1324 History   First MD Initiated Contact with Patient 06/22/15 1758     Chief Complaint  Patient presents with  . Rash  . Weakness     (Consider location/radiation/quality/duration/timing/severity/associated sxs/prior Treatment) HPI Comments: Patient presents to the emergency department with chief complaint of rash. She reports a rash on her right ankle and foot. She states that the rash comes and goes, but recently worsened over the past couple of days. She reports that it has had more pain than usual, and states that it is normally only pruritic. She states that she has been seen before for the same on the contralateral leg, and has had to be admitted for IV antibiotics after having failed outpatient therapy. Patient has not tried taking anything for this current episode on her right leg. Symptoms are worsened with palpation.  She denies any fevers, but has had subjective chills. She states that she has mild amount of nausea, but no vomiting. She denies any injury to the area. There are no alleviating factors.  The history is provided by the patient. No language interpreter was used.    Past Medical History  Diagnosis Date  . Asthma   . Fibroids   . Hypertension   . Obesity   . Heart murmur   . Eczema   . MRSA (methicillin resistant staph aureus) culture positive 05/2012    cellulits/ left leg  . Anxiety     no official dx   Past Surgical History  Procedure Laterality Date  . No past surgeries     Family History  Problem Relation Age of Onset  . Diabetes Father   . Diabetes Mother   . Cancer Maternal Uncle     prostate  . Cancer Paternal Aunt     lung  . Heart disease Maternal Grandmother   . Diabetes Maternal Grandmother   . Stroke Maternal Grandfather    Social History  Substance Use Topics  . Smoking status: Current Every Day Smoker -- 0.25 packs/day for 1 years    Types: Cigarettes  . Smokeless tobacco: Never  Used  . Alcohol Use: Yes     Comment: occ   OB History    Gravida Para Term Preterm AB TAB SAB Ectopic Multiple Living   0              Review of Systems  Skin: Positive for rash.  All other systems reviewed and are negative.     Allergies  Avocado and Shellfish allergy  Home Medications   Prior to Admission medications   Medication Sig Start Date End Date Taking? Authorizing Provider  acetaminophen (TYLENOL) 500 MG chewable tablet Chew 1,000 mg by mouth every 6 (six) hours as needed for pain.   Yes Historical Provider, MD  albuterol (PROVENTIL HFA;VENTOLIN HFA) 108 (90 BASE) MCG/ACT inhaler Inhale 2 puffs into the lungs every 6 (six) hours as needed for wheezing. 04/03/13  Yes Shanker Kristeen Mans, MD  budesonide-formoterol (SYMBICORT) 160-4.5 MCG/ACT inhaler Inhale 2 puffs into the lungs 2 (two) times daily as needed (shortness of breath). 03/29/15  Yes Margarita Mail, PA-C  hydrochlorothiazide (HYDRODIURIL) 25 MG tablet Take 1 tablet (25 mg total) by mouth daily. 03/29/15  Yes Margarita Mail, PA-C  hydrocortisone cream 0.5 % Apply 1 application topically 2 (two) times daily. To ankle.   Yes Historical Provider, MD  ibuprofen (ADVIL,MOTRIN) 200 MG tablet Take 800 mg by mouth every 6 (six)  hours as needed for moderate pain.   Yes Historical Provider, MD  montelukast (SINGULAIR) 10 MG tablet Take 1 tablet (10 mg total) by mouth at bedtime. 03/29/15  Yes Margarita Mail, PA-C  neomycin-bacitracin-polymyxin (NEOSPORIN) [phone] ointment Apply 1 application topically as directed. Per package.   Yes Historical Provider, MD   BP 166/105 mmHg  Pulse 73  Temp(Src) 99.5 F (37.5 C) (Oral)  Resp 17  SpO2 100% Physical Exam Physical Exam  Constitutional: Pt appears well-developed and well-nourished. No distress.  HENT:  Head: Normocephalic and atraumatic.  Eyes: Conjunctivae are normal.  Neck: Normal range of motion.  Cardiovascular: Normal rate, regular rhythm and intact distal  pulses.   Capillary refill < 3 sec  Pulmonary/Chest: Effort normal and breath sounds normal.  Musculoskeletal: Pt moderate swelling about the lateral aspect of the right ankle, no bony abnormality or deformity. ROM: Right ankle 4/5  Neurological: Pt  is alert. Coordination normal.  Sensation 5/5 Strength 5/5  Skin: Skin is warm and dry.  There is a dry scaly/flaky rash on ankle and toes. No evidence of abscess. Pt is not diaphoretic.  No tenting of the skin  Psychiatric: Pt has a normal mood and affect.  Nursing note and vitals reviewed.         ED Course  Procedures (including critical care time)    MDM   Final diagnoses:  Cellulitis of right lower extremity  Rash    Patient with rash and mild swelling of right ankle. She reports having had this problem before on the contralateral leg. She has been admitted to the hospital for failure of outpatient antibiotics for cellulitis on the contralateral leg. She is concerned that she has seen problem going on today on her right lower extremity. Initial vital signs are stable, CBC is normal, no leukocytosis, electrolytes are normal. Patient does not have diabetes. She has ambulatory. I suspect that she might be developing some cellulitis on the overlying rash. Patient states the rash is very itchy, so I suspect that her scratching has led to slight infection. Will treat with doxycycline. She states that this worked in the past, when [place] had failed. Consider tinea, will also treat with clotrimazole.  7:43 PM Lactate negative.    Montine Circle, PA-C 06/22/15 1943  Charlesetta Shanks, MD 06/23/15 0001

## 2015-06-22 NOTE — ED Notes (Addendum)
Pt states over the past couple days right inner ankle has cellulitis flaring up. Hx of being admitted for same on left ankle. Rash evident and draining to inner right ankle, having pain, redness and swelling visible at sight. States she can feel a painful lymphnode up into her right groin area, saying "my heart feels funny, like it's fluttering," and that she's feeling weaker than usual. Vitals stable in triage.

## 2015-06-22 NOTE — ED Notes (Signed)
Discharge instructions, follow up care, and rx x2 reviewed with patient. Patient verbalized understanding. 

## 2015-06-22 NOTE — Discharge Instructions (Signed)

## 2015-09-06 ENCOUNTER — Encounter: Payer: Self-pay | Admitting: Internal Medicine

## 2015-09-06 ENCOUNTER — Ambulatory Visit (INDEPENDENT_AMBULATORY_CARE_PROVIDER_SITE_OTHER): Payer: BLUE CROSS/BLUE SHIELD | Admitting: Internal Medicine

## 2015-09-06 VITALS — BP 146/104 | HR 70 | Temp 98.6°F | Resp 16 | Wt 297.0 lb

## 2015-09-06 DIAGNOSIS — E669 Obesity, unspecified: Secondary | ICD-10-CM | POA: Diagnosis not present

## 2015-09-06 DIAGNOSIS — R202 Paresthesia of skin: Secondary | ICD-10-CM

## 2015-09-06 DIAGNOSIS — L309 Dermatitis, unspecified: Secondary | ICD-10-CM

## 2015-09-06 DIAGNOSIS — J4520 Mild intermittent asthma, uncomplicated: Secondary | ICD-10-CM

## 2015-09-06 DIAGNOSIS — R7303 Prediabetes: Secondary | ICD-10-CM

## 2015-09-06 DIAGNOSIS — R600 Localized edema: Secondary | ICD-10-CM

## 2015-09-06 DIAGNOSIS — Z720 Tobacco use: Secondary | ICD-10-CM

## 2015-09-06 DIAGNOSIS — F17200 Nicotine dependence, unspecified, uncomplicated: Secondary | ICD-10-CM

## 2015-09-06 DIAGNOSIS — I10 Essential (primary) hypertension: Secondary | ICD-10-CM | POA: Insufficient documentation

## 2015-09-06 MED ORDER — BUDESONIDE-FORMOTEROL FUMARATE 160-4.5 MCG/ACT IN AERO: 2.0000 | INHALATION_SPRAY | Freq: Two times a day (BID) | RESPIRATORY_TRACT | Status: DC | PRN

## 2015-09-06 MED ORDER — MONTELUKAST SODIUM 10 MG PO TABS: 10.0000 mg | ORAL_TABLET | Freq: Every day | ORAL | Status: DC

## 2015-09-06 MED ORDER — HYDROCHLOROTHIAZIDE 25 MG PO TABS: 25.0000 mg | ORAL_TABLET | Freq: Every day | ORAL | Status: DC

## 2015-09-06 MED ORDER — TRIAMCINOLONE ACETONIDE 0.1 % EX CREA: 1.0000 "application " | TOPICAL_CREAM | Freq: Two times a day (BID) | CUTANEOUS | Status: DC

## 2015-09-06 MED ORDER — ALBUTEROL SULFATE HFA 108 (90 BASE) MCG/ACT IN AERS: 2.0000 | INHALATION_SPRAY | Freq: Four times a day (QID) | RESPIRATORY_TRACT | Status: DC | PRN

## 2015-09-06 NOTE — Patient Instructions (Addendum)
For gyn in the past you saw Dr. Emeterio Reeve  [facility] [place], [place] [address] from [place]  [facility] [place], [place]   Have blood work done next week.  Follow up 2 weeks.

## 2015-09-06 NOTE — Assessment & Plan Note (Signed)
Triamcinolone cream as needed Declined seeing derm at this time

## 2015-09-06 NOTE — Assessment & Plan Note (Signed)
Restarted hctz - she was not taking this and it was originally prescribed for her leg edema Check cmp next week Stressed low sodium diet Regular exercise  Weight loss Follow up in 2 weeks

## 2015-09-06 NOTE — Assessment & Plan Note (Signed)
Stressed smoking cessation - she is close to quitting and will continue to work on it

## 2015-09-06 NOTE — Assessment & Plan Note (Signed)
Check a1c Work on exercise Work on [employer] loss Low sugar/carb diet

## 2015-09-06 NOTE — Assessment & Plan Note (Signed)
Intermittent symptoms - typically with allergies Restart singulair symbicort as needed Albuterol as needed No current symptoms

## 2015-09-06 NOTE — Assessment & Plan Note (Signed)
Start regular exercise Healthy diet, decrease portions

## 2015-09-06 NOTE — Assessment & Plan Note (Signed)
Has some mild edema Restart hctz Stressed low sodium diet Weight loss, exercise

## 2015-09-06 NOTE — Progress Notes (Signed)
Pre visit review using our clinic review tool, if applicable. No additional management support is needed unless otherwise documented below in the visit note. 

## 2015-09-06 NOTE — Progress Notes (Signed)
Subjective:    Patient ID: Marissa Joseph, female    DOB: [birth year], 35 y.o.   MRN: [id number]  HPI She is here to establish with a new pcp.    She has lost a lot of weight.  She was doing zumba and was doing herbal life.  She is not doing that now and knows she needs to loose more weight. She has been lazy with getting back into the exercise.    Numbness in fingers:  She has numbness/tingling in her fingers in the morning.  Shaking her hands or moving around helps and it will go away.  The numbness/tingling is the whole hand.  The left hand seems a little weak.  This started this year.  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  Anemia:  She knows she is anemic and has fibroids. She has heavy menses - they are monthly.  She has not seen a gyn in a few years because she has not had insurance.   She has a pain in her right foot at the first MCP joint.  That started last year.  She has pain when she wears her shoes.  Eczema:  She has eczema in her right ankle and has had it on her hands.  She does have a history of cellulitis in her legs as well.  She uses a steroid cream for the eczema.   Headaches:  She has frequent headaches.  They typically come once a month.  The headaches is often in the temples bilaterally.  She denies aura and nausea with headaches.    Asthma:  Her asthma has improved as she has gotten older.  She uses the symbicort as needed.  She has only needed the albuterol as needed.  She is not taking the singulair.  Leg edema:  She was taking hctz for leg swelling.  She was not taking it for her blood pressure.  She denies a history of hypertension.  She does not monitor her BP.   Medications and allergies reviewed with patient and updated if appropriate.  Patient Active Problem List   Diagnosis Date Noted  . Prediabetes 09/06/2015  . Smoking 12/22/2013  . 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 Osteoarthritis  of left knee 08/29/2013  . Pap smear of cervix with ASCUS, cannot exclude HGSIL 06/25/2013  . Fibroid uterus 05/22/2013  . Dysmenorrhea 05/22/2013  . Monilial intertrigo 01/06/2013  . Cellulitis, leg 03/06/2012  . ALLERGIC RHINITIS 04/10/2008  . TINEA PEDIS 11/22/2007  . HEEL PAIN, LEFT 11/22/2007  . DEPENDENT EDEMA, LEGS, BILATERAL 08/06/2007  . SLEEP APNEA, OBSTRUCTIVE, SEVERE 03/25/2007  . ECZEMA 02/21/2007  . SKIN RASH 02/21/2007  . OBESITY NOS 01/21/2007  . ASTHMA 01/21/2007  . CARIES, DENTAL NEC 01/21/2007    Current Outpatient Prescriptions on File Prior to Visit  Medication Sig Dispense Refill  . albuterol (PROVENTIL HFA;VENTOLIN HFA) 108 (90 BASE) MCG/ACT inhaler Inhale 2 puffs into the lungs every 6 (six) hours as needed for wheezing. 1 Inhaler 2  . budesonide-formoterol (SYMBICORT) 160-4.5 MCG/ACT inhaler Inhale 2 puffs into the lungs 2 (two) times daily as needed (shortness of breath). 3 Inhaler 4  . clotrimazole (LOTRIMIN) 1 % cream Apply to affected area 2 times daily 15 g 0  . hydrochlorothiazide (HYDRODIURIL) 25 MG tablet Take 1 tablet (25 mg total) by mouth daily. 30 tablet 1  . hydrocortisone cream 0.5 % Apply 1 application topically 2 (  two) times daily. To ankle.    . montelukast (SINGULAIR) 10 MG tablet Take 1 tablet (10 mg total) by mouth at bedtime. 30 tablet 2  . neomycin-bacitracin-polymyxin (NEOSPORIN) [phone] ointment Apply 1 application topically as directed. Per package.     No current facility-administered medications on file prior to visit.    Past Medical History  Diagnosis Date  . Asthma   . Fibroids   . Hypertension   . Obesity   . Heart murmur   . Eczema   . MRSA (methicillin resistant staph aureus) culture positive 05/2012    cellulits/ left leg  . Anxiety     no official dx  . GONORRHEA 03/25/2008    Qualifier: Diagnosis of  By: Isla Pence      Past Surgical History  Procedure Laterality Date  . No past surgeries       Social History   Social History  . Marital Status: Single    Spouse Name: N/A  . Number of Children: N/A  . Years of Education: N/A   Social History Main Topics  . Smoking status: Current Every Day Smoker -- 0.25 packs/day for 1 years    Types: Cigarettes  . Smokeless tobacco: Never Used  . Alcohol Use: Yes     Comment: occ  . Drug Use: No  . Sexual Activity: Yes    Birth Control/ Protection: Condom   Other Topics Concern  . None   Social History Narrative    Family History  Problem Relation Age of Onset  . Diabetes Father   . Diabetes Mother   . Cancer Maternal Uncle     prostate  . Cancer Paternal Aunt     lung  . Heart disease Maternal Grandmother   . Diabetes Maternal Grandmother   . Stroke Maternal Grandfather     Review of Systems  Constitutional: Negative for fever.  Eyes: Positive for visual disturbance (has seen eye doctor).  Respiratory: Positive for cough, shortness of breath (with strenous activity only) and wheezing (rare).   Cardiovascular: Positive for chest pain (1-2 a month, sharp pains), palpitations (occasional - regular, forceful beats) and leg swelling.  Gastrointestinal: Positive for nausea. Negative for abdominal pain, diarrhea, constipation and blood in stool.  Endocrine: Positive for cold intolerance (? related to anemia).  Genitourinary: Negative for dysuria and hematuria.  Neurological: Positive for dizziness, light-headedness and headaches.  Psychiatric/Behavioral: Positive for dysphoric mood. Negative for suicidal ideas. The patient is nervous/anxious.        Objective:   Filed Vitals:   09/06/15 1513  BP: 146/104  Pulse: 70  Temp: 98.6 F (37 C)  Resp: 16   Filed Weights   09/06/15 1513  Weight: 297 lb (134.718 kg)   Body mass index is 50.95 kg/(m^2).   Physical Exam Constitutional: She appears well-developed and well-nourished. No distress.  HENT:  Head: Normocephalic and atraumatic.  Right Ear: External ear  normal. Normal ear canal and TM Left Ear: External ear normal.  Normal ear canal and TM Mouth/Throat: Oropharynx is clear and moist.  Eyes: Conjunctivae and EOM are normal.  Neck: Neck supple. No tracheal deviation present. No thyromegaly present.  No carotid bruit  Cardiovascular: Normal rate, regular rhythm and normal heart sounds.   No murmur heard.  1 + edema b/l LE with chronic skin changes Pulmonary/Chest: Effort normal and breath sounds normal. No respiratory distress. She has no wheezes. She has no rales.  Abdominal: Soft. Obese. She exhibits no distension. There is no  tenderness.  Lymphadenopathy: She has no cervical adenopathy.  Skin: Skin is warm and dry. She is not diaphoretic.  Psychiatric: She has a normal mood and affect. Her behavior is normal.        Assessment & Plan:   See Problem List for Assessment and Plan of chronic medical problems.  Blood work next week  Follow up with me in 2 weeks

## 2015-09-15 ENCOUNTER — Other Ambulatory Visit (INDEPENDENT_AMBULATORY_CARE_PROVIDER_SITE_OTHER): Payer: BLUE CROSS/BLUE SHIELD

## 2015-09-15 DIAGNOSIS — F17200 Nicotine dependence, unspecified, uncomplicated: Secondary | ICD-10-CM

## 2015-09-15 DIAGNOSIS — E669 Obesity, unspecified: Secondary | ICD-10-CM

## 2015-09-15 DIAGNOSIS — R202 Paresthesia of skin: Secondary | ICD-10-CM

## 2015-09-15 DIAGNOSIS — Z720 Tobacco use: Secondary | ICD-10-CM

## 2015-09-15 DIAGNOSIS — R7303 Prediabetes: Secondary | ICD-10-CM

## 2015-09-15 LAB — CBC WITH DIFFERENTIAL/PLATELET
BASOS PCT: 0.6 % (ref 0.0–3.0)
Basophils Absolute: 0 10*3/uL (ref 0.0–0.1)
EOS PCT: 2.3 % (ref 0.0–5.0)
Eosinophils Absolute: 0.1 10*3/uL (ref 0.0–0.7)
HEMATOCRIT: 40.5 % (ref 36.0–46.0)
HEMOGLOBIN: 13.4 g/dL (ref 12.0–15.0)
LYMPHS PCT: 33.9 % (ref 12.0–46.0)
Lymphs Abs: 2 10*3/uL (ref 0.7–4.0)
MCHC: 33.2 g/dL (ref 30.0–36.0)
MCV: 92.3 fl (ref 78.0–100.0)
Monocytes Absolute: 0.5 10*3/uL (ref 0.1–1.0)
Monocytes Relative: 8.3 % (ref 3.0–12.0)
Neutro Abs: 3.2 10*3/uL (ref 1.4–7.7)
Neutrophils Relative %: 54.9 % (ref 43.0–77.0)
Platelets: 230 10*3/uL (ref 150.0–400.0)
RBC: 4.38 Mil/uL (ref 3.87–5.11)
RDW: 15 % (ref 11.5–15.5)
WBC: 5.8 10*3/uL (ref 4.0–10.5)

## 2015-09-15 LAB — COMPREHENSIVE METABOLIC PANEL
ALBUMIN: 3.5 g/dL (ref 3.5–5.2)
ALT: 8 U/L (ref 0–35)
AST: 14 U/L (ref 0–37)
Alkaline Phosphatase: 53 U/L (ref 39–117)
BUN: 17 mg/dL (ref 6–23)
CALCIUM: 9.2 mg/dL (ref 8.4–10.5)
CHLORIDE: 108 meq/L (ref 96–112)
CO2: 27 meq/L (ref 19–32)
Creatinine, Ser: 0.88 mg/dL (ref 0.40–1.20)
GFR: 94.37 mL/min (ref >60.00)
Glucose, Bld: 90 mg/dL (ref 70–99)
POTASSIUM: 4.6 meq/L (ref 3.5–5.1)
Sodium: 141 mEq/L (ref 135–145)
Total Bilirubin: 0.3 mg/dL (ref 0.2–1.2)
Total Protein: 7.1 g/dL (ref 6.0–8.3)

## 2015-09-15 LAB — LIPID PANEL
Cholesterol: 104 mg/dL (ref 0–200)
HDL: 38 mg/dL — ABNORMAL LOW (ref >39.00)
LDL Cholesterol: 59 mg/dL (ref 0–99)
NONHDL: 66.43
TRIGLYCERIDES: 39 mg/dL (ref 0.0–149.0)
Total CHOL/HDL Ratio: 3
VLDL: 7.8 mg/dL (ref 0.0–40.0)

## 2015-09-15 LAB — VITAMIN B12: Vitamin B-12: 348 pg/mL (ref 211–911)

## 2015-09-15 LAB — HEMOGLOBIN A1C: HEMOGLOBIN A1C: 5.1 % (ref 4.6–6.5)

## 2015-09-15 LAB — TSH: TSH: 1.52 u[IU]/mL (ref 0.35–4.50)

## 2015-09-27 ENCOUNTER — Ambulatory Visit (INDEPENDENT_AMBULATORY_CARE_PROVIDER_SITE_OTHER): Payer: BLUE CROSS/BLUE SHIELD | Admitting: Internal Medicine

## 2015-09-27 ENCOUNTER — Encounter: Payer: Self-pay | Admitting: Internal Medicine

## 2015-09-27 VITALS — BP 126/86 | HR 72 | Temp 98.5°F | Resp 18 | Wt 296.0 lb

## 2015-09-27 DIAGNOSIS — I10 Essential (primary) hypertension: Secondary | ICD-10-CM

## 2015-09-27 DIAGNOSIS — E669 Obesity, unspecified: Secondary | ICD-10-CM | POA: Diagnosis not present

## 2015-09-27 DIAGNOSIS — G4733 Obstructive sleep apnea (adult) (pediatric): Secondary | ICD-10-CM | POA: Diagnosis not present

## 2015-09-27 DIAGNOSIS — R7303 Prediabetes: Secondary | ICD-10-CM

## 2015-09-27 NOTE — Assessment & Plan Note (Addendum)
Not using cpap - did not tolerate it Discussed potential consequences of untreated sleep apnea Stressed weight loss Will refer to pulmonary to be retested

## 2015-09-27 NOTE — Patient Instructions (Addendum)
  All other Health Maintenance issues reviewed.   All recommended immunizations and age-appropriate screenings are up-to-date or discussed.  No immunizations administered today.   Medications reviewed and updated.  No changes recommended at this time.  A referral was ordered for pulmonary to re-evaluate your sleep apnea  Please followup in 6 months

## 2015-09-27 NOTE — Progress Notes (Signed)
Pre visit review using our clinic review tool, if applicable. No additional management support is needed unless otherwise documented below in the visit note. 

## 2015-09-27 NOTE — Assessment & Plan Note (Signed)
Most recent A1c in normal range Continue to improve diet and work on weight loss Stressed regular exercise

## 2015-09-27 NOTE — Assessment & Plan Note (Signed)
BP well controlled Current regimen effective and well tolerated Continue current medications at current doses Follow-up in 6 months 

## 2015-09-27 NOTE — Progress Notes (Signed)
Subjective:    Patient ID: Marissa Joseph, female    DOB: Mar 26, 1981, 35 y.o.   MRN: [id number]  HPI She is here for follow up.  Hypertension: She is taking her medication daily. She is compliant with a low sodium diet.  She denies chest pain, palpitations, edema, shortness of breath and regular headaches. She is not exercising regularly.  She does not monitor her blood pressure at home.    Obesity: She knows she needs to lose weight. She is currently not exercising regularly, but she knows she needs to. She is trying to change some of her eating habits.  She continues to smoke, but he is working on decreasing the amount that she smokes.   Medications and allergies reviewed with patient and updated if appropriate.  Patient Active Problem List   Diagnosis Date Noted  . Prediabetes 09/06/2015  . Essential hypertension, benign 09/06/2015  . Smoking 12/22/2013  . 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 Osteoarthritis of left knee 08/29/2013  . Pap smear of cervix with ASCUS, cannot exclude HGSIL 06/25/2013  . Fibroid uterus 05/22/2013  . Dysmenorrhea 05/22/2013  . Monilial intertrigo 01/06/2013  . Cellulitis, leg 03/06/2012  . ALLERGIC RHINITIS 04/10/2008  . HEEL PAIN, LEFT 11/22/2007  . DEPENDENT EDEMA, LEGS, BILATERAL 08/06/2007  . SLEEP APNEA, OBSTRUCTIVE, SEVERE 03/25/2007  . Eczema 02/21/2007  . OBESITY NOS 01/21/2007  . Asthma 01/21/2007  . CARIES, DENTAL NEC 01/21/2007    Current Outpatient Prescriptions on File Prior to Visit  Medication Sig Dispense Refill  . albuterol (PROVENTIL HFA;VENTOLIN HFA) 108 (90 Base) MCG/ACT inhaler Inhale 2 puffs into the lungs every 6 (six) hours as needed for wheezing. 1 Inhaler 5  . budesonide-formoterol (SYMBICORT) 160-4.5 MCG/ACT inhaler Inhale 2 puffs into the lungs 2 (two) times daily as needed (shortness of breath). 1 Inhaler 5  . clotrimazole (LOTRIMIN) 1 % cream Apply to  affected area 2 times daily 15 g 0  . hydrochlorothiazide (HYDRODIURIL) 25 MG tablet Take 1 tablet (25 mg total) by mouth daily. 30 tablet 5  . montelukast (SINGULAIR) 10 MG tablet Take 1 tablet (10 mg total) by mouth at bedtime. 30 tablet 5  . triamcinolone cream (KENALOG) 0.1 % Apply 1 application topically 2 (two) times daily. 30 g 5   No current facility-administered medications on file prior to visit.    Past Medical History  Diagnosis Date  . Asthma   . Fibroids   . Hypertension   . Obesity   . Heart murmur   . Eczema   . MRSA (methicillin resistant staph aureus) culture positive 05/2012    cellulits/ left leg  . Anxiety     no official dx  . GONORRHEA 03/25/2008    Qualifier: Diagnosis of  By: Isla Pence      Past Surgical History  Procedure Laterality Date  . No past surgeries      Social History   Social History  . Marital Status: Single    Spouse Name: N/A  . Number of Children: N/A  . Years of Education: N/A   Social History Main Topics  . Smoking status: Current Every Day Smoker -- 0.25 packs/day for 1 years    Types: Cigarettes  . Smokeless tobacco: Never Used  . Alcohol Use: Yes     Comment: occ  . Drug Use: No  . Sexual Activity: Yes    Birth Control/ Protection: Condom   Other Topics Concern  .  None   Social History Narrative    Family History  Problem Relation Age of Onset  . Diabetes Father   . Diabetes Mother   . Cancer Maternal Uncle     prostate  . Cancer Paternal Aunt     lung  . Heart disease Maternal Grandmother   . Diabetes Maternal Grandmother   . Stroke Maternal Grandfather     Review of Systems  Constitutional: Negative for fever.  Respiratory: Negative for cough, shortness of breath and wheezing.   Cardiovascular: Negative for chest pain, palpitations and leg swelling.  Neurological: Positive for headaches (in morning). Negative for light-headedness.       Objective:   Filed Vitals:   09/27/15 1551  BP:  126/86  Pulse: 72  Temp: 98.5 F (36.9 C)  Resp: 18   Filed Weights   09/27/15 1551  Weight: 296 lb (134.265 kg)   Body mass index is 50.78 kg/(m^2).   Physical Exam Constitutional: Appears well-developed and well-nourished. No distress.  Neck: Neck supple. No tracheal deviation present. No thyromegaly present.  No carotid bruit. No cervical adenopathy.   Cardiovascular: Normal rate, regular rhythm and normal heart sounds.   1/6 systolic murmur heard.  Trace  edema Pulmonary/Chest: Effort normal and breath sounds normal. No respiratory distress. No wheezes.        Assessment & Plan:   See Problem List for Assessment and Plan of chronic medical problems.   F/u in 6 months

## 2015-09-27 NOTE — Assessment & Plan Note (Signed)
Exercise, decreasing portions, healthy diet

## 2015-12-13 ENCOUNTER — Ambulatory Visit (INDEPENDENT_AMBULATORY_CARE_PROVIDER_SITE_OTHER): Payer: Self-pay | Admitting: Internal Medicine

## 2015-12-13 ENCOUNTER — Encounter: Payer: Self-pay | Admitting: Internal Medicine

## 2015-12-13 ENCOUNTER — Encounter: Payer: Self-pay | Admitting: Pulmonary Disease

## 2015-12-13 VITALS — BP 126/72 | HR 89 | Ht 64.0 in | Wt 291.4 lb

## 2015-12-13 DIAGNOSIS — G4733 Obstructive sleep apnea (adult) (pediatric): Secondary | ICD-10-CM

## 2015-12-13 DIAGNOSIS — J4520 Mild intermittent asthma, uncomplicated: Secondary | ICD-10-CM

## 2015-12-13 NOTE — Assessment & Plan Note (Signed)
Mild intermittent asthma and a smoker. Uncomplicated. She described anxiety and misunderstanding about albuterol. Education done.

## 2015-12-13 NOTE — Progress Notes (Signed)
12/13/2015-35 year old female smoker referred courtesy of Dr Karlene Einstein Study 2008-attached. Pt not on CPAP.  NPSG-03/07/2007 AHI 121.4  BIPAP titration 04/22/2007 to 22/18,  Weight 335 lbs  (weight recorded at today's visit 291 pounds) She lives and sleeps alone but has been told that she snores. Admits daytime sleepiness. Some awareness of kicking at night. No ENT surgery. Sleep study in 2008 had confirmed very severe obstructive sleep apnea and she was titrated with BiPAP. She says she was afraid to use it and indicates significant lack of understanding about the medical condition, CPAP/BiPAP therapy. She was anxious during her sleep studies but she was afraid of the wires. Active smoker cigarettes and THC. Has had albuterol for diagnosis of asthma but dislikes using it because she thinks it is a steroid.  Prior to Admission medications   Medication Sig Start Date End Date Taking? Authorizing Provider  albuterol (PROVENTIL HFA;VENTOLIN HFA) 108 (90 Base) MCG/ACT inhaler Inhale 2 puffs into the lungs every 6 (six) hours as needed for wheezing. 09/06/15  Yes Binnie Rail, MD  budesonide-formoterol (SYMBICORT) 160-4.5 MCG/ACT inhaler Inhale 2 puffs into the lungs 2 (two) times daily as needed (shortness of breath). 09/06/15  Yes Binnie Rail, MD  clotrimazole (LOTRIMIN) 1 % cream Apply to affected area 2 times daily 06/22/15  Yes Montine Circle, PA-C  hydrochlorothiazide (HYDRODIURIL) 25 MG tablet Take 1 tablet (25 mg total) by mouth daily. 09/06/15  Yes Binnie Rail, MD  montelukast (SINGULAIR) 10 MG tablet Take 1 tablet (10 mg total) by mouth at bedtime. 09/06/15  Yes Binnie Rail, MD  triamcinolone cream (KENALOG) 0.1 % Apply 1 application topically 2 (two) times daily. 09/06/15  Yes Binnie Rail, MD   Past Medical History  Diagnosis Date  . Asthma   . Fibroids   . Hypertension   . Obesity   . Heart murmur   . Eczema   . MRSA (methicillin resistant staph aureus) culture positive 05/2012     cellulits/ left leg  . Anxiety     no official dx  . GONORRHEA 03/25/2008    Qualifier: Diagnosis of  By: Isla Pence     Past Surgical History  Procedure Laterality Date  . No past surgeries     Family History  Problem Relation Age of Onset  . Diabetes Father   . Diabetes Mother   . Cancer Maternal Uncle     prostate  . Cancer Paternal Aunt     lung  . Heart disease Maternal Grandmother   . Diabetes Maternal Grandmother   . Stroke Maternal Grandfather    Social History   Social History  . Marital Status: Single    Spouse Name: N/A  . Number of Children: N/A  . Years of Education: N/A   Occupational History  . Not on file.   Social History Main Topics  . Smoking status: Current Every Day Smoker -- 0.20 packs/day for 5 years    Types: Cigarettes  . Smokeless tobacco: Never Used  . Alcohol Use: 0.0 oz/week    0 Standard drinks or equivalent per week     Comment: occ  . Drug Use: Yes     Comment: pot-about 1 joint BID  . Sexual Activity: Yes    Birth Control/ Protection: Condom   Other Topics Concern  . Not on file   Social History Narrative   ROS-see HPI   Negative unless "+" Constitutional:    weight loss, night sweats, fevers, chills, +  fatigue, lassitude. HEENT:    headaches, difficulty swallowing, tooth/dental problems, sore throat,       sneezing, itching, ear ache, nasal congestion, post nasal drip, snoring CV:    chest pain, orthopnea, PND, swelling in lower extremities, anasarca,                                                     dizziness, palpitations Resp:   shortness of breath with exertion or at rest.                productive cough,   non-productive cough, coughing up of blood.              change in color of mucus.  wheezing.   Skin:    rash or lesions. GI:  No-   heartburn, indigestion, abdominal pain, nausea, vomiting, diarrhea,                 change in bowel habits, loss of appetite GU: dysuria, change in color of urine, no  urgency or frequency.   flank pain. MS:   joint pain, stiffness, decreased range of motion, back pain. Neuro-     nothing unusual Psych:  change in mood or affect.  + depression or anxiety.   memory loss.  OBJ- Physical Exam General- Alert, Oriented, + mildly anxious, Distress- none acute, + morbidly obese Skin- rash-none, lesions- none, excoriation- none Lymphadenopathy- none Head- atraumatic            Eyes- Gross vision intact, PERRLA, conjunctivae and secretions clear            Ears- Hearing, canals-normal            Nose- Clear, no-Septal dev, mucus, polyps, erosion, perforation             Throat- Mallampati III , mucosa clear , drainage- none, tonsils- atrophic Neck- flexible , trachea midline, no stridor , thyroid nl, carotid no bruit Chest - symmetrical excursion , unlabored           Heart/CV- RRR , no murmur , no gallop  , no rub, nl s1 s2                           - JVD- none , edema- none, stasis changes- none, varices- none           Lung- clear to P&A, wheeze- none, cough- none , dullness-none, rub- none           Chest wall-  Abd-  Br/ Gen/ Rectal- Not done, not indicated Extrem- cyanosis- none, clubbing, none, atrophy- none, strength- nl Neuro- grossly intact to observation

## 2015-12-13 NOTE — Assessment & Plan Note (Signed)
Severe obstructive sleep apnea diagnosed by sleep study in 2008. She was afraid to try CPAP because she didn't understand it. Living alone now but aware that she snores and is sleepy during the daytime. Does report 44 pound weight loss since her original study. Needs update documentation. We have begun a more detailed discussion of obstructive sleep apnea, medical issues, importance of weight, good sleep hygiene, her responsibility to drive safely, treatment choices. Plan-update sleep study

## 2015-12-13 NOTE — Patient Instructions (Addendum)
Order- schedule unattended home sleep test or split NPSG     Dx OSA  We will work with you to get you back to go over the results once we know when your study is scheduled. If you have your study and don't know when to come back, please call so we can help.

## 2016-01-28 ENCOUNTER — Telehealth: Payer: Self-pay | Admitting: *Deleted

## 2016-01-28 DIAGNOSIS — G4733 Obstructive sleep apnea (adult) (pediatric): Secondary | ICD-10-CM

## 2016-01-28 NOTE — Telephone Encounter (Signed)
-----   Message from Ilona Sorrel sent at 01/27/2016  4:15 PM EDT ----- Regarding: in-lab test instead of hst She was supposed to come back for hst appt today.  I just called her.  She said she does have Medicaid now & will have to call us with the Medicaid #.  So, she needs to have an order for an in-lab study.  Can you cancel the order for the hst?  She had an appt scheduled for hst follow up on 9/8.  I cancelled that appt.  Judeen Hammans

## 2016-01-28 NOTE — Telephone Encounter (Signed)
Order has been placed as requested.

## 2016-02-11 ENCOUNTER — Ambulatory Visit: Payer: Self-pay | Admitting: Internal Medicine

## 2016-03-03 ENCOUNTER — Encounter (HOSPITAL_COMMUNITY): Payer: Self-pay | Admitting: *Deleted

## 2016-03-03 ENCOUNTER — Inpatient Hospital Stay (HOSPITAL_COMMUNITY)
Admission: AD | Admit: 2016-03-03 | Discharge: 2016-03-04 | Disposition: A | Discharge status: Home / Self Care | Payer: Self-pay | Source: Ambulatory Visit | Attending: Obstetrics and Gynecology | Admitting: Obstetrics and Gynecology

## 2016-03-03 DIAGNOSIS — R109 Unspecified abdominal pain: Secondary | ICD-10-CM | POA: Insufficient documentation

## 2016-03-03 DIAGNOSIS — Z3202 Encounter for pregnancy test, result negative: Secondary | ICD-10-CM | POA: Insufficient documentation

## 2016-03-03 LAB — CBC
HCT: 38.4 % (ref 36.0–46.0)
Hemoglobin: 13.2 g/dL (ref 12.0–15.0)
MCH: 31.7 pg (ref 26.0–34.0)
MCHC: 34.4 g/dL (ref 30.0–36.0)
MCV: 92.1 fL (ref 78.0–100.0)
PLATELETS: 214 10*3/uL (ref 150–400)
RBC: 4.17 MIL/uL (ref 3.87–5.11)
RDW: 12.7 % (ref 11.5–15.5)
WBC: 5.8 10*3/uL (ref 4.0–10.5)

## 2016-03-03 LAB — URINE MICROSCOPIC-ADD ON

## 2016-03-03 LAB — COMPREHENSIVE METABOLIC PANEL
ALT: 14 U/L (ref 14–54)
AST: 17 U/L (ref 15–41)
Albumin: 4.1 g/dL (ref 3.5–5.0)
Alkaline Phosphatase: 50 U/L (ref 38–126)
Anion gap: 6 (ref 5–15)
BUN: 21 mg/dL — ABNORMAL HIGH (ref 6–20)
CHLORIDE: 104 mmol/L (ref 101–111)
CO2: 28 mmol/L (ref 22–32)
Calcium: 9.4 mg/dL (ref 8.9–10.3)
Creatinine, Ser: 0.91 mg/dL (ref 0.44–1.00)
Glucose, Bld: 87 mg/dL (ref 65–99)
POTASSIUM: 3.6 mmol/L (ref 3.5–5.1)
SODIUM: 138 mmol/L (ref 135–145)
Total Bilirubin: 0.5 mg/dL (ref 0.3–1.2)
Total Protein: 7.4 g/dL (ref 6.5–8.1)

## 2016-03-03 LAB — URINALYSIS, ROUTINE W REFLEX MICROSCOPIC
BILIRUBIN URINE: NEGATIVE
Glucose, UA: NEGATIVE mg/dL
Hgb urine dipstick: NEGATIVE
KETONES UR: NEGATIVE mg/dL
NITRITE: NEGATIVE
Protein, ur: NEGATIVE mg/dL
pH: 6 (ref 5.0–8.0)

## 2016-03-03 LAB — WET PREP, GENITAL
Sperm: NONE SEEN
TRICH WET PREP: NONE SEEN
Yeast Wet Prep HPF POC: NONE SEEN

## 2016-03-03 LAB — POCT PREGNANCY, URINE: PREG TEST UR: NEGATIVE

## 2016-03-03 LAB — AMYLASE: AMYLASE: 52 U/L (ref 28–100)

## 2016-03-03 LAB — LIPASE, BLOOD: LIPASE: 16 U/L (ref 11–51)

## 2016-03-03 MED ORDER — KETOROLAC TROMETHAMINE 60 MG/2ML IM SOLN
60.0000 mg | Freq: Once | INTRAMUSCULAR | Status: AC
  Administered 2016-03-03: 60 mg via INTRAMUSCULAR
  Filled 2016-03-03: qty 2

## 2016-03-03 NOTE — MAU Note (Signed)
Pt presents to MAU with complaints of pain in her abdomen, states it feels like a pulling in her belly button, took 800mg  Motrin this 1000 and states after the motrin wore off the pain got worse than before

## 2016-03-04 DIAGNOSIS — R109 Unspecified abdominal pain: Secondary | ICD-10-CM

## 2016-03-04 LAB — RPR: RPR: NONREACTIVE

## 2016-03-04 LAB — HIV ANTIBODY (ROUTINE TESTING W REFLEX): HIV SCREEN 4TH GENERATION: NONREACTIVE

## 2016-03-04 MED ORDER — IBUPROFEN 800 MG PO TABS: 800.0000 mg | ORAL_TABLET | Freq: Three times a day (TID) | ORAL | 1 refills | Status: DC | PRN

## 2016-03-04 MED ORDER — OXYCODONE-ACETAMINOPHEN 5-325 MG PO TABS: 1.0000 | ORAL_TABLET | Freq: Four times a day (QID) | ORAL | 0 refills | Status: DC | PRN

## 2016-03-04 NOTE — Discharge Instructions (Signed)
Abdominal Pain, Adult Many things can cause abdominal pain. Usually, abdominal pain is not caused by a disease and will improve without treatment. It can often be observed and treated at home. Your health care provider will do a physical exam and possibly order blood tests and X-rays to help determine the seriousness of your pain. However, in many cases, more time must pass before a clear cause of the pain can be found. Before that point, your health care provider may not know if you need more testing or further treatment. HOME CARE INSTRUCTIONS Monitor your abdominal pain for any changes. The following actions may help to alleviate any discomfort you are experiencing:  Only take over-the-counter or prescription medicines as directed by your health care provider.  Do not take laxatives unless directed to do so by your health care provider.  Try a clear liquid diet (broth, tea, or water) as directed by your health care provider. Slowly move to a bland diet as tolerated. SEEK MEDICAL CARE IF:  You have unexplained abdominal pain.  You have abdominal pain associated with nausea or diarrhea.  You have pain when you urinate or have a bowel movement.  You experience abdominal pain that wakes you in the night.  You have abdominal pain that is worsened or improved by eating food.  You have abdominal pain that is worsened with eating fatty foods.  You have a fever. SEEK IMMEDIATE MEDICAL CARE IF:  Your pain does not go away within 2 hours.  You keep throwing up (vomiting).  Your pain is felt only in portions of the abdomen, such as the right side or the left lower portion of the abdomen.  You pass bloody or black tarry stools. MAKE SURE YOU:  Understand these instructions.  Will watch your condition.  Will get help right away if you are not doing well or get worse.   This information is not intended to replace advice given to you by your health care provider. Make sure you discuss  any questions you have with your health care provider.   Document Released: 03/01/2005 Document Revised: 02/10/2015 Document Reviewed: 01/29/2013 Elsevier Interactive Patient Education 2016 [employer].  [facility] [place] Guide (Revised August 2014)   Chronic Pain Problems:   [place] [facility]:  [phone]           Patients need to be referred by their primary care doctor/specialist  Insufficient Money for Medicine:           United Way: call "211"    MAP Program at [place] or [place] [phone]            No Primary Care Doctor:  To locate a primary care doctor that accepts your insurance or provides certain services:           [place]: [phone]           Physician Referral Service: [phone] ask for My [place]  If no insurance, you need to see if you qualify for [facility] orange card, call to set      up appointment for eligibility/enrollment at [phone] or [phone] or visit [place] ([address], [place] and [place]) to meet with a [facility] enrollment specialist.  Agencies that provide inexpensive (sliding fee scale) medical care:       [facility] - Family Medicine at [place] - [phone]  [facility]  -  [place] [facility] - [phone]     [place] [phone]     [facility] for Children - [place] [phone]  [facility] - [place] @ [place] [phone]-     [phone]  [facility] - [place] @ [place] - [phone]  [facility]: [phone]   Women's Clinic: [phone]   Planned Parenthood: [phone]   [facility] [place]    [place] Providers:           [place] (No Family Planning accepted)          [address], [phone], Mon-Fri 9am-5pm          [place] [phone]  [place], [address] [place], Mon-Thursday 8am-5pm, Fri 8am-noon  [place] - [place], [address], Mon-Fri 7:30am-4:30pm          [place] - [phone]          [address], [facility] - [phone] [address], [address]          Only accepts [employer] patients after they have their name applied to their card  Self Pay (no insurance) in [facility]:           Sickle Cell Patients:   [place], [phone] [facility]:  [address] [place] [phone]       [facility] and Wellness  [address] [place] [phone]  [facility]:  [address] [phone]          [facility]           [place], [phone] [facility] for [place], [phone]           [facility] Urgent [place]           [place] [address], [place] [address]           [phone], Mon-Fri 9am-7pm, [place] 9am-1pm          [facility] - Family Medicine @ [facility]          [place], [place]          [facility] - [facility]           [address], [place] [facility] [place]  [address] [place] [phone]          [place], [place]  [facility] - [place]   [phone]  [place], [phone] [facility] - [place]  [address], [phone]  Dr. Vista Lawman           [address], [place], [place]           [address], [phone]          [facility]             [address], [phone]          [facility]           [place], [place], 1st & 3rd Saturday every month, 10am-1pm  OTHERS:  [facility]  ([place] [facility] Only)  [phone] (Thursday only)  Strategies for finding a Primary Care Provider:  1) Find a Doctor and Pay Out of Pocket  Although you won't have to find out who is covered by your insurance plan, it is a good idea to ask around and get recommendations. You will then need to call the office and see if the doctor you have chosen will accept you as a new patient and what types of options they offer for patients who are self-pay. Some doctors offer discounts or will set up payment plans for their patients who do not have insurance, but you will need to ask so you aren't surprised when you get to your appointment.  2) [place] - To see if you qualify for orange card access to healthcare safety net providers.  Call for appointment for eligibility/enrollment at [phone] or [phone]. (Uninsured, 0-200% FPL, qualifying info)  Applicants for [facility] are first required to see if they are eligible to enroll in the [facility] Marketplace before enrolling in [facility] (and get an exemption if they are not).  GCCN Criteria for acceptance is:  ? Proof of [employer] exemption - form or documentation  ? Valid photo ID (driver's license, state identification card, passport, home country ID)  ? Proof of [facility] residency (e.g. drivers license, lease/landlord information, pay stubs with address, utility bill, bank statement, etc.)   ? Proof of income (1040, last year's tax return, W2, 4 current pay stubs, other income proof)  ? Proof of assets (current bank statement + 3 most recent, disability paperwork, life insurance info, tax value on autos, etc.)  3) [place] Department  Not all health departments have doctors that can see patients for sick visits, but many do, so it is worth a call to see if yours does. If you don't know where your local health department is, you can check in your phone book. The CDC also has a tool to help you locate your state's health department, and many state websites also have listings of all of their local health departments.  4) Find a [place] [facility]  If your illness is not likely to be very severe or complicated, you may want to try a walk in clinic. These are popping up all over the country in pharmacies, drugstores, and shopping centers. They're usually staffed by nurse practitioners or physician assistants that have been trained to treat common illnesses and complaints. They're usually fairly quick and inexpensive. However, if you have serious medical issues or chronic medical problems, these are probably not your best option

## 2016-03-04 NOTE — Progress Notes (Signed)
[place] and verbal d/c instructions given and understanding voiced.  

## 2016-03-06 LAB — GC/CHLAMYDIA PROBE AMP (~~LOC~~) NOT AT ARMC
CHLAMYDIA, DNA PROBE: NEGATIVE
NEISSERIA GONORRHEA: NEGATIVE

## 2016-03-28 ENCOUNTER — Encounter: Payer: BLUE CROSS/BLUE SHIELD | Admitting: Internal Medicine

## 2016-03-28 NOTE — Progress Notes (Signed)
Subjective:    Patient ID: Marissa Joseph, female    DOB: [birth year], 35 y.o.   MRN: [id number]  HPI NO SHOW   Medications and allergies reviewed with patient and updated if appropriate.  Patient Active Problem List   Diagnosis Date Noted  . Prediabetes 09/06/2015  . Essential hypertension, benign 09/06/2015  . Smoking 12/22/2013  . 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 Osteoarthritis of left knee 08/29/2013  . Pap smear of cervix with ASCUS, cannot exclude HGSIL 06/25/2013  . Fibroid uterus 05/22/2013  . Dysmenorrhea 05/22/2013  . Monilial intertrigo 01/06/2013  . Cellulitis, leg 03/06/2012  . ALLERGIC RHINITIS 04/10/2008  . HEEL PAIN, LEFT 11/22/2007  . DEPENDENT EDEMA, LEGS, BILATERAL 08/06/2007  . Obstructive sleep apnea 03/25/2007  . Eczema 02/21/2007  . OBESITY NOS 01/21/2007  . Asthma 01/21/2007  . CARIES, DENTAL NEC 01/21/2007    Current Outpatient Prescriptions on File Prior to Visit  Medication Sig Dispense Refill  . albuterol (PROVENTIL HFA;VENTOLIN HFA) 108 (90 Base) MCG/ACT inhaler Inhale 2 puffs into the lungs every 6 (six) hours as needed for wheezing. 1 Inhaler 5  . budesonide-formoterol (SYMBICORT) 160-4.5 MCG/ACT inhaler Inhale 2 puffs into the lungs 2 (two) times daily as needed (shortness of breath). 1 Inhaler 5  . clotrimazole (LOTRIMIN) 1 % cream Apply to affected area 2 times daily 15 g 0  . hydrochlorothiazide (HYDRODIURIL) 25 MG tablet Take 1 tablet (25 mg total) by mouth daily. 30 tablet 5  . ibuprofen (ADVIL,MOTRIN) 800 MG tablet Take 1 tablet (800 mg total) by mouth every 8 (eight) hours as needed. 60 tablet 1  . montelukast (SINGULAIR) 10 MG tablet Take 1 tablet (10 mg total) by mouth at bedtime. 30 tablet 5  . oxyCODONE-acetaminophen (PERCOCET/ROXICET) 5-325 MG tablet Take 1-2 tablets by mouth every 6 (six) hours as needed for severe pain. 6 tablet 0  . triamcinolone cream (KENALOG) 0.1 %  Apply 1 application topically 2 (two) times daily. 30 g 5   No current facility-administered medications on file prior to visit.     Past Medical History:  Diagnosis Date  . Anxiety    no official dx  . Asthma   . Eczema   . Fibroids   . GONORRHEA 03/25/2008   Qualifier: Diagnosis of  By: Isla Pence    . Heart murmur   . Heart murmur   . Hypertension   . MRSA (methicillin resistant staph aureus) culture positive 05/2012   cellulits/ left leg  . Obesity     Past Surgical History:  Procedure Laterality Date  . NO PAST SURGERIES      Social History   Social History  . Marital status: Single    Spouse name: N/A  . Number of children: N/A  . Years of education: N/A   Social History Main Topics  . Smoking status: Current Every Day Smoker    Packs/day: 0.20    Years: 5.00    Types: Cigarettes  . Smokeless tobacco: Never Used  . Alcohol use 0.0 oz/week     Comment: occ  . Drug use:     Types: Marijuana     Comment: pot-about 1 joint BID  . Sexual activity: Yes    Birth control/ protection: Condom   Other Topics Concern  . Not on file   Social History Narrative  . No narrative on file    Family History  Problem Relation Age of Onset  .  Heart disease Maternal Grandmother   . Diabetes Maternal Grandmother   . Diabetes Father   . Diabetes Mother   . Cancer Maternal Uncle     prostate  . Cancer Paternal Aunt     lung  . Stroke Maternal Grandfather     Review of Systems     Objective:  There were no vitals filed for this visit. There were no vitals filed for this visit. There is no height or weight on file to calculate BMI.   Physical Exam         Assessment & Plan:    See Problem List for Assessment and Plan of chronic medical problems.    This encounter was created in error - please disregard.

## 2016-05-30 ENCOUNTER — Encounter: Payer: Self-pay | Admitting: Internal Medicine

## 2016-05-30 NOTE — Telephone Encounter (Signed)
Not sure what this is regarding?  Is her insurance changing?  Do we accept it?

## 2016-07-05 ENCOUNTER — Ambulatory Visit: Payer: Self-pay | Admitting: Internal Medicine

## 2016-07-11 ENCOUNTER — Ambulatory Visit: Payer: Self-pay | Admitting: Internal Medicine

## 2016-10-16 ENCOUNTER — Encounter: Payer: Self-pay | Admitting: Internal Medicine

## 2016-10-30 ENCOUNTER — Emergency Department (HOSPITAL_COMMUNITY)
Admission: EM | Admit: 2016-10-30 | Discharge: 2016-10-30 | Disposition: A | Discharge status: Home / Self Care | Payer: Self-pay | Attending: Emergency Medicine | Admitting: Emergency Medicine

## 2016-10-30 ENCOUNTER — Encounter (HOSPITAL_COMMUNITY): Payer: Self-pay | Admitting: Emergency Medicine

## 2016-10-30 DIAGNOSIS — H6991 Unspecified Eustachian tube disorder, right ear: Secondary | ICD-10-CM | POA: Insufficient documentation

## 2016-10-30 DIAGNOSIS — I10 Essential (primary) hypertension: Secondary | ICD-10-CM | POA: Insufficient documentation

## 2016-10-30 DIAGNOSIS — H938X1 Other specified disorders of right ear: Secondary | ICD-10-CM

## 2016-10-30 DIAGNOSIS — H6981 Other specified disorders of Eustachian tube, right ear: Secondary | ICD-10-CM

## 2016-10-30 DIAGNOSIS — J309 Allergic rhinitis, unspecified: Secondary | ICD-10-CM | POA: Insufficient documentation

## 2016-10-30 DIAGNOSIS — F17210 Nicotine dependence, cigarettes, uncomplicated: Secondary | ICD-10-CM | POA: Insufficient documentation

## 2016-10-30 MED ORDER — FLUTICASONE PROPIONATE 50 MCG/ACT NA SUSP: 2.0000 | Freq: Every day | NASAL | 0 refills | Status: DC

## 2016-10-30 NOTE — ED Triage Notes (Signed)
Pt complaint of right ear fulling and nasal congestion worsening over past 3 weeks.

## 2016-10-30 NOTE — ED Provider Notes (Signed)
[facility] DEPT Provider Note   CSN: [id number] Arrival date & time: 10/30/16  1703   By signing my name below, I, Neta Mends, attest that this documentation has been prepared under the direction and in the presence of [address], [employer]. Electronically Signed: Neta Mends, ED Scribe. 10/30/2016. 6:14 PM.   History   Chief Complaint Chief Complaint  Patient presents with  . Nasal Congestion  . Ear Fullness    The history is provided by the patient and medical records. No language interpreter was used.  Ear Fullness  This is a new problem. The current episode started more than 1 week ago. The problem occurs constantly. The problem has not changed since onset.Pertinent negatives include no chest pain, no abdominal pain and no shortness of breath. Nothing aggravates the symptoms. Relieved by: ibuprofen, nasal spray, allegra D. Treatments tried: ibuprofen, nasal spray, allegra D. The treatment provided mild relief.    HPI Comments: Marissa Joseph is a 36 y.o. female with a PMHx of atopy, asthma, environmental allergies, and eczema, who presents to the Emergency Department complaining of ongoing right ear fullness and waxing/waning nasal congestion x 3 weeks. Pt states her nasal congestion waxes and wanes based on the weather changes. Pt denies any ear pain/drainage, but reports difficulty hearing on the R side due to the fullness. Pt reports going swimming yesterday, but no swimming prior to onset of symptoms. No known sick contacts. Pt also complains of associated rhinorrhea. Pt has used drops for swimmer's ear with no relief of symptoms, and used azelastine nasal spray, ibuprofen, and Allegra D with mild relief but admits she's missed doses and taken it inconsistently. She denies ear pain/drainage, sore throat, cough, fevers, chills, CP, SOB, abd pain, N/V/D/C, hematuria, dysuria, myalgias, arthralgias, numbness, tingling, focal weakness, rashes, or any other  complaints at this time.     Past Medical History:  Diagnosis Date  . Anxiety    no official dx  . Asthma   . Eczema   . Fibroids   . GONORRHEA 03/25/2008   Qualifier: Diagnosis of  By: Isla Pence    . Heart murmur   . Heart murmur   . Hypertension   . MRSA (methicillin resistant staph aureus) culture positive 05/2012   cellulits/ left leg  . Obesity     Patient Active Problem List   Diagnosis Date Noted  . Prediabetes 09/06/2015  . Essential hypertension, benign 09/06/2015  . Smoking 12/22/2013  . 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 Osteoarthritis of left knee 08/29/2013  . Pap smear of cervix with ASCUS, cannot exclude HGSIL 06/25/2013  . Fibroid uterus 05/22/2013  . Dysmenorrhea 05/22/2013  . Monilial intertrigo 01/06/2013  . ALLERGIC RHINITIS 04/10/2008  . HEEL PAIN, LEFT 11/22/2007  . DEPENDENT EDEMA, LEGS, BILATERAL 08/06/2007  . Obstructive sleep apnea 03/25/2007  . Eczema 02/21/2007  . OBESITY NOS 01/21/2007  . Asthma 01/21/2007    Past Surgical History:  Procedure Laterality Date  . NO PAST SURGERIES      OB History    Gravida Para Term Preterm AB Living   0             SAB TAB Ectopic Multiple Live Births                   Home Medications    Prior to Admission medications   Medication Sig Start Date End Date Taking? Authorizing Provider  albuterol (PROVENTIL HFA;VENTOLIN  HFA) 108 (90 Base) MCG/ACT inhaler Inhale 2 puffs into the lungs every 6 (six) hours as needed for wheezing. 09/06/15   Binnie Rail, MD  budesonide-formoterol (SYMBICORT) 160-4.5 MCG/ACT inhaler Inhale 2 puffs into the lungs 2 (two) times daily as needed (shortness of breath). 09/06/15   Binnie Rail, MD  clotrimazole (LOTRIMIN) 1 % cream Apply to affected area 2 times daily 06/22/15   Montine Circle, PA-C  hydrochlorothiazide (HYDRODIURIL) 25 MG tablet Take 1 tablet (25 mg total) by mouth daily. 09/06/15   Binnie Rail, MD  ibuprofen (ADVIL,MOTRIN) 800 MG tablet Take 1 tablet (800 mg total) by mouth every 8 (eight) hours as needed. 03/04/16   Leftwich-Kirby, Kathie Dike, CNM  montelukast (SINGULAIR) 10 MG tablet Take 1 tablet (10 mg total) by mouth at bedtime. 09/06/15   Binnie Rail, MD  oxyCODONE-acetaminophen (PERCOCET/ROXICET) 5-325 MG tablet Take 1-2 tablets by mouth every 6 (six) hours as needed for severe pain. 03/04/16   Leftwich-Kirby, Kathie Dike, CNM  triamcinolone cream (KENALOG) 0.1 % Apply 1 application topically 2 (two) times daily. 09/06/15   Binnie Rail, MD    Family History Family History  Problem Relation Age of Onset  . Heart disease Maternal Grandmother   . Diabetes Maternal Grandmother   . Diabetes Father   . Diabetes Mother   . Cancer Maternal Uncle        prostate  . Cancer Paternal Aunt        lung  . Stroke Maternal Grandfather     Social History Social History  Substance Use Topics  . Smoking status: Current Every Day Smoker    Packs/day: 0.20    Years: 5.00    Types: Cigarettes  . Smokeless tobacco: Never Used  . Alcohol use 0.0 oz/week     Comment: occ     Allergies   Avocado and Shellfish allergy   Review of Systems Review of Systems  Constitutional: Negative for chills and fever.  HENT: Positive for congestion, hearing loss and rhinorrhea. Negative for ear discharge, ear pain and sore throat.        +ear fullness  Respiratory: Negative for cough and shortness of breath.   Cardiovascular: Negative for chest pain.  Gastrointestinal: Negative for abdominal pain, constipation, diarrhea, nausea and vomiting.  Genitourinary: Negative for dysuria and hematuria.  Musculoskeletal: Negative for arthralgias and myalgias.  Skin: Negative for color change and rash.  Allergic/Immunologic: Positive for environmental allergies. Negative for immunocompromised state.  Neurological: Negative for weakness and numbness.  Psychiatric/Behavioral: Negative for confusion.   All  systems reviewed and are negative for acute change except as noted in the HPI.   Physical Exam Updated Vital Signs BP 136/87 (BP Location: Left Wrist)   Pulse 67   Temp 98.2 F (36.8 C) (Oral)   Resp 14   Ht 5\' 4"  (1.626 m)   Wt 274 lb (124.3 kg)   LMP 09/13/2016   SpO2 100%   BMI 47.03 kg/m   Physical Exam  Constitutional: She is oriented to person, place, and time. Vital signs are normal. She appears well-developed and well-nourished.  Non-toxic appearance. No distress.  Afebrile, nontoxic, NAD  HENT:  Head: Normocephalic and atraumatic.  Right Ear: Hearing, external ear and ear canal normal. Tympanic membrane is not injected, not erythematous and not bulging. A middle ear effusion is present.  Left Ear: Hearing, tympanic membrane, external ear and ear canal normal.  Nose: Mucosal edema present. No rhinorrhea.  Mouth/Throat: Uvula  is midline, oropharynx is clear and moist and mucous membranes are normal. No trismus in the jaw. No uvula swelling.  Right TM with serous effusion but otherwise clear. Ears are otherwise clear bilaterally. Nose mildly congested. Oropharynx clear and moist, without uvular swelling or deviation, no trismus or drooling, no tonsillar swelling or erythema, no exudates.    Eyes: Conjunctivae and EOM are normal. Right eye exhibits no discharge. Left eye exhibits no discharge.  Neck: Normal range of motion. Neck supple.  Cardiovascular: Normal rate and intact distal pulses.   Pulmonary/Chest: Effort normal. No respiratory distress.  Abdominal: Normal appearance. She exhibits no distension.  Musculoskeletal: Normal range of motion.  Neurological: She is alert and oriented to person, place, and time. She has normal strength. No sensory deficit.  Skin: Skin is warm, dry and intact. No rash noted.  Psychiatric: She has a normal mood and affect. Her behavior is normal.  Nursing note and vitals reviewed.    ED Treatments / Results  DIAGNOSTIC  STUDIES:  Oxygen Saturation is 100% on RA, normal by my interpretation.    COORDINATION OF CARE:  6:06 PM Discussed treatment plan with pt at bedside and pt agreed to plan.   Labs (all labs ordered are listed, but only abnormal results are displayed) Labs Reviewed - No data to display  EKG  EKG Interpretation None       Radiology No results found.  Procedures Procedures (including critical care time)  Medications Ordered in ED Medications - No data to display   Initial Impression / Assessment and Plan / ED Course  I have reviewed the triage vital signs and the nursing notes.  Pertinent labs & imaging results that were available during my care of the patient were reviewed by me and considered in my medical decision making (see chart for details).     36 y.o. female here with ongoing nasal congestion which waxes/wanes with the weather changes, and R ear fullness. Has been inconsistently using allegra and azelastine nasal spray with minimal improvement. No ear pain/drainage reported. On exam, R ear with mild serous effusion but otherwise ears are clear, no evidence of infection; mild nasal congestion without rhinorrhea. Likely eustachian tube dysfunction from allergic rhinitis. Advised strict adherence to meds including her nasal spray, daily antihistamine, and use of netipot; also will rx flonase since this will add another layer of benefit via nasal steroid. Discussed OTC remedies for symptom relief, f/up with PCP in 1wk for recheck. I explained the diagnosis and have given explicit precautions to return to the ER including for any other new or worsening symptoms. The patient understands and accepts the medical plan as it's been dictated and I have answered their questions. Discharge instructions concerning home care and prescriptions have been given. The patient is STABLE and is discharged to home in good condition.   I personally performed the services described in this  documentation, which was scribed in my presence. The recorded information has been reviewed and is accurate.    Final Clinical Impressions(s) / ED Diagnoses   Final diagnoses:  Allergic rhinitis, unspecified seasonality, unspecified trigger  Ear fullness, right  Dysfunction of right eustachian tube    New Prescriptions New Prescriptions   FLUTICASONE (FLONASE) 50 MCG/ACT NASAL SPRAY    Place 2 sprays into both nostrils daily.     [address], [place], [place] 10/30/16 1835    Daleen Bo, MD 10/31/16 [phone]

## 2016-10-30 NOTE — Discharge Instructions (Signed)
Continue to stay well-hydrated. Continue to alternate between Tylenol and Ibuprofen for pain or fever. Use Mucinex for cough suppression/expectoration of mucus. Use netipot and flonase in addition to daily antihistamines (zyrtec/claritin/allegra/etc) and your other nasal spray to help with nasal congestion and help with symptoms. You must be diligent about using these in order to really get adequate benefit from them. Follow up with your primary care doctor in 5-7 days for recheck of ongoing symptoms. Return to emergency department for emergent changing or worsening of symptoms.

## 2016-11-24 ENCOUNTER — Encounter: Payer: Self-pay | Admitting: Internal Medicine

## 2016-11-24 ENCOUNTER — Ambulatory Visit (INDEPENDENT_AMBULATORY_CARE_PROVIDER_SITE_OTHER): Payer: BLUE CROSS/BLUE SHIELD | Admitting: Internal Medicine

## 2016-11-24 VITALS — BP 124/88 | HR 83 | Temp 98.4°F | Resp 16 | Wt 273.0 lb

## 2016-11-24 DIAGNOSIS — J0100 Acute maxillary sinusitis, unspecified: Secondary | ICD-10-CM

## 2016-11-24 DIAGNOSIS — H9190 Unspecified hearing loss, unspecified ear: Secondary | ICD-10-CM

## 2016-11-24 MED ORDER — AMOXICILLIN-POT CLAVULANATE 875-125 MG PO TABS: 1.0000 | ORAL_TABLET | Freq: Two times a day (BID) | ORAL | 0 refills | Status: DC

## 2016-11-24 MED ORDER — FLUCONAZOLE 150 MG PO TABS: 150.0000 mg | ORAL_TABLET | Freq: Once | ORAL | 0 refills | Status: AC

## 2016-11-24 NOTE — Assessment & Plan Note (Signed)
Has some chronic hearing loss, but seems to have gotten worse Possibly related to eustachian tube dysfunction, but concern for other chronic issue We will refer to ENT Continue Flonase We will treat possible sinus infection

## 2016-11-24 NOTE — Progress Notes (Signed)
Subjective:    Patient ID: Marissa Joseph, female    DOB: 1981-04-03, 36 y.o.   MRN: [id number]  HPI She is here for an acute visit.   For a while she has been having a feeling of both ears being plugged.her right ear is worse than her left. She has decreased hearing deficit in the right ear, but even in the left ear. This is somewhat chronic and she is concerned about her hearing. She states some mild nasal congestion, sinus pressure, ear discomfort, cough, shortness breath, occasional wheeze and headaches. She does have asthma and her asthma is not as well-controlled with her allergies this year.   She has been taking sinus sinus.  She used allegra - D, claritin.   She uses the flonase daily.  Nothing seems to be helping.   Medications and allergies reviewed with patient and updated if appropriate.  Patient Active Problem List   Diagnosis Date Noted  . Prediabetes 09/06/2015  . Essential hypertension, benign 09/06/2015  . Smoking 12/22/2013  . 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 Osteoarthritis of left knee 08/29/2013  . Pap smear of cervix with ASCUS, cannot exclude HGSIL 06/25/2013  . Fibroid uterus 05/22/2013  . Dysmenorrhea 05/22/2013  . Monilial intertrigo 01/06/2013  . ALLERGIC RHINITIS 04/10/2008  . HEEL PAIN, LEFT 11/22/2007  . DEPENDENT EDEMA, LEGS, BILATERAL 08/06/2007  . Obstructive sleep apnea 03/25/2007  . Eczema 02/21/2007  . OBESITY NOS 01/21/2007  . Asthma 01/21/2007    Current Outpatient Prescriptions on File Prior to Visit  Medication Sig Dispense Refill  . albuterol (PROVENTIL HFA;VENTOLIN HFA) 108 (90 Base) MCG/ACT inhaler Inhale 2 puffs into the lungs every 6 (six) hours as needed for wheezing. 1 Inhaler 5  . budesonide-formoterol (SYMBICORT) 160-4.5 MCG/ACT inhaler Inhale 2 puffs into the lungs 2 (two) times daily as needed (shortness of breath). 1 Inhaler 5  . clotrimazole (LOTRIMIN) 1 % cream  Apply to affected area 2 times daily 15 g 0  . fluticasone (FLONASE) 50 MCG/ACT nasal spray Place 2 sprays into both nostrils daily. 16 g 0  . hydrochlorothiazide (HYDRODIURIL) 25 MG tablet Take 1 tablet (25 mg total) by mouth daily. 30 tablet 5  . ibuprofen (ADVIL,MOTRIN) 800 MG tablet Take 1 tablet (800 mg total) by mouth every 8 (eight) hours as needed. 60 tablet 1  . montelukast (SINGULAIR) 10 MG tablet Take 1 tablet (10 mg total) by mouth at bedtime. 30 tablet 5  . oxyCODONE-acetaminophen (PERCOCET/ROXICET) 5-325 MG tablet Take 1-2 tablets by mouth every 6 (six) hours as needed for severe pain. 6 tablet 0  . triamcinolone cream (KENALOG) 0.1 % Apply 1 application topically 2 (two) times daily. 30 g 5   No current facility-administered medications on file prior to visit.     Past Medical History:  Diagnosis Date  . Anxiety    no official dx  . Asthma   . Eczema   . Fibroids   . GONORRHEA 03/25/2008   Qualifier: Diagnosis of  By: Isla Pence    . Heart murmur   . Heart murmur   . Hypertension   . MRSA (methicillin resistant staph aureus) culture positive 05/2012   cellulits/ left leg  . Obesity     Past Surgical History:  Procedure Laterality Date  . NO PAST SURGERIES      Social History   Social History  . Marital status: Single    Spouse name: N/A  .  Number of children: N/A  . Years of education: N/A   Social History Main Topics  . Smoking status: Current Every Day Smoker    Packs/day: 0.20    Years: 5.00    Types: Cigarettes  . Smokeless tobacco: Never Used  . Alcohol use 0.0 oz/week     Comment: occ  . Drug use: Yes    Types: Marijuana     Comment: pot-about 1 joint BID  . Sexual activity: Yes    Birth control/ protection: Condom   Other Topics Concern  . Not on file   Social History Narrative  . No narrative on file    Family History  Problem Relation Age of Onset  . Heart disease Maternal Grandmother   . Diabetes Maternal Grandmother   .  Diabetes Father   . Diabetes Mother   . Cancer Maternal Uncle        prostate  . Cancer Paternal Aunt        lung  . Stroke Maternal Grandfather     Review of Systems  Constitutional: Negative for chills and fever.  HENT: Positive for congestion (some ), ear pain (sometimes from pressure), hearing loss (R > L) and sinus pressure. Negative for sinus pain and sore throat.   Respiratory: Positive for cough, shortness of breath (increased) and wheezing (a little).   Cardiovascular: Positive for palpitations and leg swelling (at work only when on her feet a lot). Negative for chest pain.  Endocrine: Positive for cold intolerance.  Neurological: Positive for dizziness (sometimes) and headaches (right side of head and frontal region). Negative for light-headedness.       Objective:   Vitals:   11/24/16 1621  BP: 124/88  Pulse: 83  Resp: 16  Temp: 98.4 F (36.9 C)   Filed Weights   11/24/16 1621  Weight: 273 lb (123.8 kg)   Body mass index is 46.86 kg/m.  Wt Readings from Last 3 Encounters:  11/24/16 273 lb (123.8 kg)  10/30/16 274 lb (124.3 kg)  03/03/16 281 lb (127.5 kg)     Physical Exam GENERAL APPEARANCE: Appears stated age, well appearing, NAD EYES: conjunctiva clear, no icterus HEENT: bilateral tympanic membranes and ear canals normal, oropharynx with no erythema, no thyromegaly, trachea midline, no cervical or supraclavicular lymphadenopathy; poor dentition LUNGS: Clear to auscultation without wheeze or crackles, unlabored breathing, good air entry bilaterally HEART: Normal S1,S2 without murmurs        Assessment & Plan:   See Problem List for Assessment and Plan of chronic medical problems.

## 2016-11-24 NOTE — Patient Instructions (Signed)

## 2016-11-24 NOTE — Assessment & Plan Note (Signed)
Concern for sinus infection No improvement with over-the-counter medications including Flonase and oral antihistamine  Has been symptomatic for over a month We'll treat with an antibiotic-Augmentin Continue oral antihistamine and Flonase

## 2016-12-14 ENCOUNTER — Other Ambulatory Visit: Payer: Self-pay | Admitting: *Deleted

## 2016-12-14 ENCOUNTER — Encounter: Payer: Self-pay | Admitting: Internal Medicine

## 2016-12-14 MED ORDER — BUDESONIDE-FORMOTEROL FUMARATE 160-4.5 MCG/ACT IN AERO: 2.0000 | INHALATION_SPRAY | Freq: Two times a day (BID) | RESPIRATORY_TRACT | 0 refills | Status: DC | PRN

## 2016-12-14 MED ORDER — ALBUTEROL SULFATE HFA 108 (90 BASE) MCG/ACT IN AERS: 2.0000 | INHALATION_SPRAY | Freq: Four times a day (QID) | RESPIRATORY_TRACT | 0 refills | Status: DC | PRN

## 2017-04-24 ENCOUNTER — Encounter (HOSPITAL_COMMUNITY): Payer: Self-pay | Admitting: Emergency Medicine

## 2017-04-24 ENCOUNTER — Emergency Department (HOSPITAL_COMMUNITY)
Admission: EM | Admit: 2017-04-24 | Discharge: 2017-04-24 | Disposition: A | Discharge status: Home / Self Care | Payer: BLUE CROSS/BLUE SHIELD | Attending: Emergency Medicine | Admitting: Emergency Medicine

## 2017-04-24 DIAGNOSIS — R404 Transient alteration of awareness: Secondary | ICD-10-CM | POA: Diagnosis not present

## 2017-04-24 DIAGNOSIS — R51 Headache: Secondary | ICD-10-CM | POA: Diagnosis present

## 2017-04-24 DIAGNOSIS — R42 Dizziness and giddiness: Secondary | ICD-10-CM | POA: Diagnosis not present

## 2017-04-24 DIAGNOSIS — I10 Essential (primary) hypertension: Secondary | ICD-10-CM | POA: Insufficient documentation

## 2017-04-24 DIAGNOSIS — R112 Nausea with vomiting, unspecified: Secondary | ICD-10-CM | POA: Diagnosis not present

## 2017-04-24 DIAGNOSIS — J45909 Unspecified asthma, uncomplicated: Secondary | ICD-10-CM | POA: Insufficient documentation

## 2017-04-24 DIAGNOSIS — F17210 Nicotine dependence, cigarettes, uncomplicated: Secondary | ICD-10-CM | POA: Diagnosis not present

## 2017-04-24 DIAGNOSIS — Z79899 Other long term (current) drug therapy: Secondary | ICD-10-CM | POA: Diagnosis not present

## 2017-04-24 DIAGNOSIS — N946 Dysmenorrhea, unspecified: Secondary | ICD-10-CM | POA: Diagnosis not present

## 2017-04-24 LAB — CBC WITH DIFFERENTIAL/PLATELET
Basophils Absolute: 0 10*3/uL (ref 0.0–0.1)
Basophils Relative: 0 %
Eosinophils Absolute: 0.2 10*3/uL (ref 0.0–0.7)
Eosinophils Relative: 4 %
HEMATOCRIT: 39.1 % (ref 36.0–46.0)
HEMOGLOBIN: 13 g/dL (ref 12.0–15.0)
LYMPHS ABS: 1.6 10*3/uL (ref 0.7–4.0)
LYMPHS PCT: 34 %
MCH: 31.6 pg (ref 26.0–34.0)
MCHC: 33.2 g/dL (ref 30.0–36.0)
MCV: 94.9 fL (ref 78.0–100.0)
MONO ABS: 0.5 10*3/uL (ref 0.1–1.0)
MONOS PCT: 11 %
NEUTROS ABS: 2.4 10*3/uL (ref 1.7–7.7)
Neutrophils Relative %: 51 %
Platelets: 231 10*3/uL (ref 150–400)
RBC: 4.12 MIL/uL (ref 3.87–5.11)
RDW: 13.3 % (ref 11.5–15.5)
WBC: 4.7 10*3/uL (ref 4.0–10.5)

## 2017-04-24 LAB — URINALYSIS, ROUTINE W REFLEX MICROSCOPIC
BACTERIA UA: NONE SEEN
Bilirubin Urine: NEGATIVE
GLUCOSE, UA: NEGATIVE mg/dL
Ketones, ur: NEGATIVE mg/dL
NITRITE: NEGATIVE
PH: 5 (ref 5.0–8.0)
PROTEIN: NEGATIVE mg/dL
SPECIFIC GRAVITY, URINE: 1.029 (ref 1.005–1.030)

## 2017-04-24 LAB — BASIC METABOLIC PANEL
Anion gap: 7 (ref 5–15)
BUN: 18 mg/dL (ref 6–20)
CALCIUM: 9.2 mg/dL (ref 8.9–10.3)
CHLORIDE: 107 mmol/L (ref 101–111)
CO2: 25 mmol/L (ref 22–32)
CREATININE: 0.79 mg/dL (ref 0.44–1.00)
GFR calc Af Amer: >60 mL/min (ref >60)
GFR calc non Af Amer: >60 mL/min (ref >60)
GLUCOSE: 93 mg/dL (ref 65–99)
Potassium: 3.8 mmol/L (ref 3.5–5.1)
Sodium: 139 mmol/L (ref 135–145)

## 2017-04-24 LAB — PREGNANCY, URINE: Preg Test, Ur: NEGATIVE

## 2017-04-24 MED ORDER — KETOROLAC TROMETHAMINE 30 MG/ML IJ SOLN
30.0000 mg | Freq: Once | INTRAMUSCULAR | Status: AC
  Administered 2017-04-24: 30 mg via INTRAVENOUS
  Filled 2017-04-24: qty 1

## 2017-04-24 MED ORDER — SODIUM CHLORIDE 0.9 % IV BOLUS (SEPSIS)
1000.0000 mL | Freq: Once | INTRAVENOUS | Status: AC
  Administered 2017-04-24: 1000 mL via INTRAVENOUS

## 2017-04-24 MED ORDER — ONDANSETRON HCL 4 MG/2ML IJ SOLN
4.0000 mg | Freq: Once | INTRAMUSCULAR | Status: AC
  Administered 2017-04-24: 4 mg via INTRAVENOUS
  Filled 2017-04-24: qty 2

## 2017-04-24 MED ORDER — ONDANSETRON HCL 4 MG PO TABS: 4.0000 mg | ORAL_TABLET | Freq: Four times a day (QID) | ORAL | 0 refills | Status: DC

## 2017-04-24 NOTE — ED Triage Notes (Signed)
Patient brought in by [facility] from work for headache that started this morning then when she got to work got dizzy so called EMS. Patient took advil 1000mg  around 750ish this morning.

## 2017-04-24 NOTE — ED Triage Notes (Signed)
Patient adds that her menstrual cycle came on this morning and has bad cycles due to fibroids.

## 2017-04-24 NOTE — ED Provider Notes (Signed)
[place] DEPT Provider Note   CSN: [id number] Arrival date & time: 04/24/17  1000     History   Chief Complaint Chief Complaint  Patient presents with  . Headache  . Dizziness  . Oligomenorrhea    HPI Marissa Joseph is a 36 y.o. female.  Headache and dizziness this morning at work.  Patient just started her menstrual period and has had significant suprapubic discomfort.  No fever, sweats, chills, neuro deficits, dysuria.  She has been taking ibuprofen for her menstrual pain which seems to help.      Past Medical History:  Diagnosis Date  . Anxiety    no official dx  . Asthma   . Eczema   . Fibroids   . GONORRHEA 03/25/2008   Qualifier: Diagnosis of  By: Isla Pence    . Heart murmur   . Heart murmur   . Hypertension   . MRSA (methicillin resistant staph aureus) culture positive 05/2012   cellulits/ left leg  . Obesity     Patient Active Problem List   Diagnosis Date Noted  . Subacute maxillary sinusitis 11/24/2016  . Hearing difficulty 11/24/2016  . Prediabetes 09/06/2015  . Essential hypertension, benign 09/06/2015  . Smoking 12/22/2013  . 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 Osteoarthritis of left knee 08/29/2013  . Pap smear of cervix with ASCUS, cannot exclude HGSIL 06/25/2013  . Fibroid uterus 05/22/2013  . Dysmenorrhea 05/22/2013  . Monilial intertrigo 01/06/2013  . ALLERGIC RHINITIS 04/10/2008  . HEEL PAIN, LEFT 11/22/2007  . DEPENDENT EDEMA, LEGS, BILATERAL 08/06/2007  . Obstructive sleep apnea 03/25/2007  . Eczema 02/21/2007  . OBESITY NOS 01/21/2007  . Asthma 01/21/2007    Past Surgical History:  Procedure Laterality Date  . NO PAST SURGERIES      OB History    Gravida Para Term Preterm AB Living   0             SAB TAB Ectopic Multiple Live Births                   Home Medications    Prior to Admission medications   Medication Sig  Start Date End Date Taking? Authorizing Provider  albuterol (PROVENTIL HFA;VENTOLIN HFA) 108 (90 Base) MCG/ACT inhaler Inhale 2 puffs into the lungs every 6 (six) hours as needed for wheezing. Must see provider for future refills 12/14/16  Yes Burns, Claudina Lick, MD  montelukast (SINGULAIR) 10 MG tablet Take 1 tablet (10 mg total) by mouth at bedtime. 09/06/15  Yes Burns, Claudina Lick, MD  triamcinolone cream (KENALOG) 0.1 % Apply 1 application topically 2 (two) times daily. 09/06/15  Yes Burns, Claudina Lick, MD  amoxicillin-clavulanate (AUGMENTIN) 875-125 MG tablet Take 1 tablet by mouth 2 (two) times daily. Patient not taking: Reported on 04/24/2017 11/24/16   Binnie Rail, MD  budesonide-formoterol [facility]) 160-4.5 MCG/ACT inhaler Inhale 2 puffs into the lungs 2 (two) times daily as needed (shortness of breath). Must see provider for future refills Patient not taking: Reported on 04/24/2017 12/14/16   Binnie Rail, MD  clotrimazole (LOTRIMIN) 1 % cream Apply to affected area 2 times daily Patient not taking: Reported on 04/24/2017 06/22/15   Montine Circle, PA-C  fluticasone [facility]) 50 MCG/ACT nasal spray Place 2 sprays into both nostrils daily. Patient not taking: Reported on 04/24/2017 10/30/16   Street, Burke, PA-C  hydrochlorothiazide (HYDRODIURIL) 25 MG tablet Take 1 tablet (25 mg  total) by mouth daily. Patient not taking: Reported on 04/24/2017 09/06/15   Binnie Rail, MD  ibuprofen (ADVIL,MOTRIN) 800 MG tablet Take 1 tablet (800 mg total) by mouth every 8 (eight) hours as needed. Patient not taking: Reported on 04/24/2017 03/04/16   Leftwich-Kirby, Lattie Haw A, CNM  ondansetron (ZOFRAN) 4 MG tablet Take 1 tablet (4 mg total) by mouth every 6 (six) hours. 04/24/17   Nat Christen, MD    Family History Family History  Problem Relation Age of Onset  . Heart disease Maternal Grandmother   . Diabetes Maternal Grandmother   . Diabetes Father   . Diabetes Mother   . Cancer Maternal Uncle        prostate   . Cancer Paternal Aunt        lung  . Stroke Maternal Grandfather     Social History Social History   Tobacco Use  . Smoking status: Current Every Day Smoker    Packs/day: 0.20    Years: 5.00    Pack years: 1.00    Types: Cigarettes  . Smokeless tobacco: Never Used  Substance Use Topics  . Alcohol use: Yes    Alcohol/week: 0.0 oz    Comment: occ  . Drug use: Yes    Types: Marijuana    Comment: pot-about 1 joint BID     Allergies   Avocado and Shellfish allergy   Review of Systems Review of Systems  All other systems reviewed and are negative.    Physical Exam Updated Vital Signs BP (!) 153/87   Pulse (!) 56   Temp 97.7 F (36.5 C) (Oral)   Resp 15   Ht 5\' 4"  (1.626 m)   Wt 119 kg (262 lb 4 oz)   LMP 04/24/2017   SpO2 100%   BMI 45.02 kg/m   Physical Exam  Constitutional: She is oriented to person, place, and time. She appears well-developed and well-nourished.  nad  HENT:  Head: Normocephalic and atraumatic.  Eyes: Conjunctivae are normal.  Neck: Neck supple.  Cardiovascular: Normal rate and regular rhythm.  Pulmonary/Chest: Effort normal and breath sounds normal.  Abdominal: Soft. Bowel sounds are normal.  Minimal suprapubic tenderness  Musculoskeletal: Normal range of motion.  Neurological: She is alert and oriented to person, place, and time.  Skin: Skin is warm and dry.  Psychiatric: She has a normal mood and affect. Her behavior is normal.  Nursing note and vitals reviewed.    ED Treatments / Results  Labs (all labs ordered are listed, but only abnormal results are displayed) Labs Reviewed  URINALYSIS, ROUTINE W REFLEX MICROSCOPIC - Abnormal; Notable for the following components:      Result Value   Hgb urine dipstick MODERATE (*)    Leukocytes, UA SMALL (*)    Squamous Epithelial / LPF 0-5 (*)    All other components within normal limits  PREGNANCY, URINE  CBC WITH DIFFERENTIAL/PLATELET  BASIC METABOLIC PANEL    EKG  EKG  Interpretation None       Radiology No results found.  Procedures Procedures (including critical care time)  Medications Ordered in ED Medications  sodium chloride 0.9 % bolus 1,000 mL (0 mLs Intravenous Stopped 04/24/17 1230)  ondansetron (ZOFRAN) injection 4 mg (4 mg Intravenous Given 04/24/17 1125)  ketorolac (TORADOL) 30 MG/ML injection 30 mg (30 mg Intravenous Given 04/24/17 1123)     Initial Impression / Assessment and Plan / ED Course  I have reviewed the triage vital signs and the nursing notes.  Pertinent labs & imaging results that were available during my care of the patient were reviewed by me and considered in my medical decision making (see chart for details).   No acute abdomen on physical exam.  Patient feels much better after IV fluids, IV Toradol, IV Zofran.  Discharge medications Zofran 4 mg    Final Clinical Impressions(s) / ED Diagnoses   Final diagnoses:  Intractable vomiting with nausea, unspecified vomiting type  Dysmenorrhea    ED Discharge Orders        Ordered    ondansetron (ZOFRAN) 4 MG tablet  Every 6 hours     04/24/17 1458       Nat Christen, MD 04/24/17 [phone]

## 2017-04-24 NOTE — Discharge Instructions (Signed)
Continue ibuprofen for menstrual cramps.  Prescription for nausea medication.  Increase fluids.

## 2017-07-12 NOTE — Progress Notes (Signed)
Subjective:    Patient ID: Marissa Joseph, female    DOB: 1981/01/20, 37 y.o.   MRN: [id number]  HPI The patient is here for an acute visit.   Stomach pain and cramping:  This started over two months ago.  She will notice it prior to her menses.  It is related to what she eats.  Salad can ball up in her middle abdomen - she gets a big knot in her umbilicus and it is uncomfortable - it feels like it is going to pop.    She has a bowel movement 2-3 times a day and it is soft stool.    These episodes occur about twice a month - she has cramping, a ball in the middle of the abdomen, she feels constipated and nauseous.  Sometimes ibuprofen helps for a couple of hours.  Once the pain goes away she can have a bowel movement and she will feel better.  The knot in the central abdomen.    She denies fever/chills.  Her appetite is just ok.  She is working as a Electrical engineer.   Hypertension:  She is not currently taking her medication. She did not have insurance for a while and that is why.  She has lost weight and is very active.  She does not monitor her bp.    Asthma:  She uses her albuterol as needed but not often.  She often needs it with activity or when hot.  She needs a refill  Medications and allergies reviewed with patient and updated if appropriate.  Patient Active Problem List   Diagnosis Date Noted  . Hearing difficulty 11/24/2016  . Prediabetes 09/06/2015  . Essential hypertension, benign 09/06/2015  . Smoking 12/22/2013  . 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 Osteoarthritis of left knee 08/29/2013  . Pap smear of cervix with ASCUS, cannot exclude HGSIL 06/25/2013  . Fibroid uterus 05/22/2013  . Dysmenorrhea 05/22/2013  . Monilial intertrigo 01/06/2013  . ALLERGIC RHINITIS 04/10/2008  . HEEL PAIN, LEFT 11/22/2007  . DEPENDENT EDEMA, LEGS, BILATERAL 08/06/2007  . Obstructive sleep apnea 03/25/2007  . Eczema 02/21/2007    . OBESITY NOS 01/21/2007  . Asthma 01/21/2007    Current Outpatient Medications on File Prior to Visit  Medication Sig Dispense Refill  . albuterol (PROVENTIL HFA;VENTOLIN HFA) 108 (90 Base) MCG/ACT inhaler Inhale 2 puffs into the lungs every 6 (six) hours as needed for wheezing. Must see provider for future refills 1 Inhaler 0  . hydrochlorothiazide (HYDRODIURIL) 25 MG tablet Take 1 tablet (25 mg total) by mouth daily. 30 tablet 5  . montelukast (SINGULAIR) 10 MG tablet Take 1 tablet (10 mg total) by mouth at bedtime. 30 tablet 5  . ondansetron (ZOFRAN) 4 MG tablet Take 1 tablet (4 mg total) by mouth every 6 (six) hours. 8 tablet 0  . triamcinolone cream (KENALOG) 0.1 % Apply 1 application topically 2 (two) times daily. 30 g 5   No current facility-administered medications on file prior to visit.     Past Medical History:  Diagnosis Date  . Anxiety    no official dx  . Asthma   . Eczema   . Fibroids   . GONORRHEA 03/25/2008   Qualifier: Diagnosis of  By: Isla Pence    . Heart murmur   . Heart murmur   . Hypertension   . MRSA (methicillin resistant staph aureus) culture positive 05/2012   cellulits/ left leg  .  Obesity     Past Surgical History:  Procedure Laterality Date  . NO PAST SURGERIES      Social History   Socioeconomic History  . Marital status: Single    Spouse name: None  . Number of children: None  . Years of education: None  . Highest education level: None  Social Needs  . Financial resource strain: None  . Food insecurity - worry: None  . Food insecurity - inability: None  . Transportation needs - medical: None  . Transportation needs - non-medical: None  Occupational History  . None  Tobacco Use  . Smoking status: Current Every Day Smoker    Packs/day: 0.20    Years: 5.00    Pack years: 1.00    Types: Cigarettes  . Smokeless tobacco: Never Used  Substance and Sexual Activity  . Alcohol use: Yes    Alcohol/week: 0.0 oz    Comment:  occ  . Drug use: Yes    Types: Marijuana    Comment: pot-about 1 joint BID  . Sexual activity: Yes    Birth control/protection: Condom  Other Topics Concern  . None  Social History Narrative  . None    Family History  Problem Relation Age of Onset  . Heart disease Maternal Grandmother   . Diabetes Maternal Grandmother   . Diabetes Father   . Diabetes Mother   . Cancer Maternal Uncle        prostate  . Cancer Paternal Aunt        lung  . Stroke Maternal Grandfather     Review of Systems  Constitutional: Negative for chills and fever.  Gastrointestinal: Positive for abdominal pain, constipation (with episodes) and nausea (with episodes). Negative for blood in stool and diarrhea.  Genitourinary: Negative for difficulty urinating, dysuria and hematuria.  Neurological: Positive for headaches. Negative for light-headedness.       Objective:   Vitals:   07/13/17 0750  BP: (!) 144/90  Pulse: 64  Resp: 16  Temp: 98.4 F (36.9 C)  SpO2: 97%   Wt Readings from Last 3 Encounters:  07/13/17 251 lb (113.9 kg)  04/24/17 262 lb 4 oz (119 kg)  11/24/16 273 lb (123.8 kg)   Body mass index is 43.08 kg/m.   Physical Exam  Constitutional: She appears well-developed and well-nourished. No distress.  HENT:  Head: Normocephalic and atraumatic.  Cardiovascular: Normal rate and regular rhythm.  Pulmonary/Chest: Effort normal and breath sounds normal.  Abdominal: Soft. She exhibits no distension and no mass. There is tenderness (superior umbilicus sligtly tender, palpable hernia). There is no rebound and no guarding.  obese  Skin: Skin is warm and dry. She is not diaphoretic. No erythema.           Assessment & Plan:    See Problem List for Assessment and Plan of chronic medical problems.

## 2017-07-13 ENCOUNTER — Ambulatory Visit: Payer: BLUE CROSS/BLUE SHIELD | Admitting: Internal Medicine

## 2017-07-13 ENCOUNTER — Encounter: Payer: Self-pay | Admitting: Internal Medicine

## 2017-07-13 VITALS — BP 144/90 | HR 64 | Temp 98.4°F | Resp 16 | Wt 251.0 lb

## 2017-07-13 DIAGNOSIS — I10 Essential (primary) hypertension: Secondary | ICD-10-CM

## 2017-07-13 DIAGNOSIS — J4520 Mild intermittent asthma, uncomplicated: Secondary | ICD-10-CM | POA: Diagnosis not present

## 2017-07-13 DIAGNOSIS — K439 Ventral hernia without obstruction or gangrene: Secondary | ICD-10-CM | POA: Diagnosis not present

## 2017-07-13 MED ORDER — ALBUTEROL SULFATE HFA 108 (90 BASE) MCG/ACT IN AERS: 2.0000 | INHALATION_SPRAY | Freq: Four times a day (QID) | RESPIRATORY_TRACT | 8 refills | Status: DC | PRN

## 2017-07-13 MED ORDER — HYDROCHLOROTHIAZIDE 25 MG PO TABS: 25.0000 mg | ORAL_TABLET | Freq: Every day | ORAL | 5 refills | Status: DC

## 2017-07-13 NOTE — Assessment & Plan Note (Signed)
Has not been taking hctz - did not have insurance  BP elevated Restart hctz Encouraged her to monitor BP Advised CPE annually Blood work a few months ago reviewed - normal CMP

## 2017-07-13 NOTE — Patient Instructions (Addendum)
Your abdominal pain is from a hernia.  If your pain gets worse or does not go away you may need to consider surgery.  Let me know if you want a referral to see a surgeon.      Ventral Hernia A ventral hernia is a bulge of tissue from inside the abdomen that pushes through a weak area of the muscles that form the front wall of the abdomen. The tissues inside the abdomen are inside a sac (peritoneum). These tissues include the small intestine, large intestine, and the fatty tissue that covers the intestines (omentum). Sometimes, the bulge that forms a hernia contains intestines. Other hernias contain only fat. Ventral hernias do not go away without surgical treatment. There are several types of ventral hernias. You may have:  A hernia at an incision site from previous abdominal surgery (incisional hernia).  A hernia just above the belly button (epigastric hernia), or at the belly button (umbilical hernia). These types of hernias can develop from heavy lifting or straining.  A hernia that comes and goes (reducible hernia). It may be visible only when you lift or strain. This type of hernia can be pushed back into the abdomen (reduced).  A hernia that traps abdominal tissue inside the hernia (incarcerated hernia). This type of hernia does not reduce.  A hernia that cuts off blood flow to the tissues inside the hernia (strangulated hernia). The tissues can start to die if this happens. This is a very painful bulge that cannot be reduced. A strangulated hernia is a medical emergency.  What are the causes? This condition is caused by abdominal tissue putting pressure on an area of weakness in the abdominal muscles. What increases the risk? The following factors may make you more likely to develop this condition:  Being female.  Being 64 or older.  Being overweight or obese.  Having had previous abdominal surgery, especially if there was an infection after surgery.  Having had an injury to the  abdominal wall.  Having had several pregnancies.  Having a buildup of fluid inside the abdomen (ascites).  What are the signs or symptoms? The only symptom of a ventral hernia may be a painless bulge in the abdomen. A reducible hernia may be visible only when you strain, cough, or lift. Other symptoms may include:  Dull pain.  A feeling of pressure.  Signs and symptoms of a strangulated hernia may include:  Increasing pain.  Nausea and vomiting.  Pain when pressing on the hernia.  The skin over the hernia turning red or purple.  Constipation.  Blood in the stool (feces).  How is this diagnosed? This condition may be diagnosed based on:  Your symptoms.  Your medical history.  A physical exam. You may be asked to cough or strain while standing. These actions increase the pressure inside your abdomen and force the hernia through the opening in your muscles. Your health care provider may try to reduce the hernia by pressing on it.  Imaging studies, such as an ultrasound or CT scan.  How is this treated? This condition is treated with surgery. If you have a strangulated hernia, surgery is done as soon as possible. If your hernia is small and not incarcerated, you may be asked to lose some weight before surgery. Follow these instructions at home:  Follow instructions from your health care provider about eating or drinking restrictions.  If you are overweight, your health care provider may recommend that you increase your activity level and eat  a healthier diet.  Do not lift anything that is heavier than 10 lb (4.5 kg).  Return to your normal activities as told by your health care provider. Ask your health care provider what activities are safe for you. You may need to avoid activities that increase pressure on your hernia.  Take over-the-counter and prescription medicines only as told by your health care provider.  Keep all follow-up visits as told by your health care  provider. This is important. Contact a health care provider if:  Your hernia gets larger.  Your hernia becomes painful. Get help right away if:  Your hernia becomes increasingly painful.  You have pain along with any of the following: ? Changes in skin color in the area of the hernia. ? Nausea. ? Vomiting. ? Fever. Summary  A ventral hernia is a bulge of tissue from inside the abdomen that pushes through a weak area of the muscles that form the front wall of the abdomen.  This condition is treated with surgery, which may be urgent depending on your hernia.  Do not lift anything that is heavier than 10 lb (4.5 kg), and follow activity instructions from your health care provider. This information is not intended to replace advice given to you by your health care provider. Make sure you discuss any questions you have with your health care provider. Document Released: 05/08/2012 Document Revised: 01/07/2016 Document Reviewed: 01/07/2016 Elsevier Interactive Patient Education  [employer].

## 2017-07-13 NOTE — Assessment & Plan Note (Signed)
Mild, intermittent Uses inhaler as needed - not using often Will refill

## 2017-07-13 NOTE — Assessment & Plan Note (Signed)
Abdominal pain likely from a ventral hernia Discussed cause and treatment Discussed surgery referral - she would like to hold off for now She will monitor and let me know if she wants a referral Discussed triggers for pain Continue weight loss efforts

## 2017-08-04 ENCOUNTER — Encounter: Payer: Self-pay | Admitting: Internal Medicine

## 2017-08-09 ENCOUNTER — Ambulatory Visit: Payer: BLUE CROSS/BLUE SHIELD | Admitting: Family Medicine

## 2017-08-09 DIAGNOSIS — Z0289 Encounter for other administrative examinations: Secondary | ICD-10-CM

## 2017-08-09 NOTE — Progress Notes (Deleted)
  LANDRI DORSAINVIL - 37 y.o. female MRN [id number]  Date of birth: [birth year]  SUBJECTIVE:  Including CC & ROS.  No chief complaint on file.   BEVA REMUND is a 37 y.o. female that is  ***.  ***   Review of Systems  HISTORY: Past Medical, Surgical, Social, and Family History Reviewed & Updated per EMR.   Pertinent Historical Findings include:  Past Medical History:  Diagnosis Date  . Anxiety    no official dx  . Asthma   . Eczema   . Fibroids   . GONORRHEA 03/25/2008   Qualifier: Diagnosis of  By: Isla Pence    . Heart murmur   . Heart murmur   . Hypertension   . MRSA (methicillin resistant staph aureus) culture positive 05/2012   cellulits/ left leg  . Obesity     Past Surgical History:  Procedure Laterality Date  . NO PAST SURGERIES      Allergies  Allergen Reactions  . Avocado Other (See Comments)    Throat itching and swelling  . Shellfish Allergy Hives, Shortness Of Breath and Swelling    Family History  Problem Relation Age of Onset  . Heart disease Maternal Grandmother   . Diabetes Maternal Grandmother   . Diabetes Father   . Diabetes Mother   . Cancer Maternal Uncle        prostate  . Cancer Paternal Aunt        lung  . Stroke Maternal Grandfather      Social History   Socioeconomic History  . Marital status: Single    Spouse name: Not on file  . Number of children: Not on file  . Years of education: Not on file  . Highest education level: Not on file  Social Needs  . Financial resource strain: Not on file  . Food insecurity - worry: Not on file  . Food insecurity - inability: Not on file  . Transportation needs - medical: Not on file  . Transportation needs - non-medical: Not on file  Occupational History  . Not on file  Tobacco Use  . Smoking status: Current Every Day Smoker    Packs/day: 0.20    Years: 5.00    Pack years: 1.00    Types: Cigarettes  . Smokeless tobacco: Never Used  Substance and Sexual Activity   . Alcohol use: Yes    Alcohol/week: 0.0 oz    Comment: occ  . Drug use: Yes    Types: Marijuana    Comment: pot-about 1 joint BID  . Sexual activity: Yes    Birth control/protection: Condom  Other Topics Concern  . Not on file  Social History Narrative  . Not on file     PHYSICAL EXAM:  VS: There were no vitals taken for this visit. Physical Exam Gen: NAD, alert, cooperative with exam, well-appearing ENT: normal lips, normal nasal mucosa,  Eye: normal EOM, normal conjunctiva and lids CV:  no edema, +2 pedal pulses   Resp: no accessory muscle use, non-labored,  GI: no masses or tenderness, no hernia  Skin: no rashes, no areas of induration  Neuro: normal tone, normal sensation to touch Psych:  normal insight, alert and oriented MSK:  ***      ASSESSMENT & PLAN:   No problem-specific Assessment & Plan notes found for this encounter.

## 2017-08-13 NOTE — Progress Notes (Signed)
Subjective:    Patient ID: Rolanda Lundborg, female    DOB: [birth year], 37 y.o.   MRN: [id number]  HPI She is here for an acute visit for cold symptoms.  Her symptoms started 2 weeks ago.  10 days ago her symptoms worsened and shortness of breath and cough was worse.    She is experiencing fever at home, congestion, ear pain, runny nose, sinus pressure, sore throat, hoarseness, chest tightness/wheeze, sob and dry cough, nausea, myalgias, headaches and lightheadedness.    She has taken mucinex, theraflu, albuterol and ibuprofen  Medications and allergies reviewed with patient and updated if appropriate.  Patient Active Problem List   Diagnosis Date Noted  . Ventral hernia without obstruction or gangrene 07/13/2017  . Hearing difficulty 11/24/2016  . Prediabetes 09/06/2015  . Essential hypertension, benign 09/06/2015  . Smoking 12/22/2013  . 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 Osteoarthritis of left knee 08/29/2013  . Pap smear of cervix with ASCUS, cannot exclude HGSIL 06/25/2013  . Fibroid uterus 05/22/2013  . Dysmenorrhea 05/22/2013  . Monilial intertrigo 01/06/2013  . ALLERGIC RHINITIS 04/10/2008  . HEEL PAIN, LEFT 11/22/2007  . DEPENDENT EDEMA, LEGS, BILATERAL 08/06/2007  . Obstructive sleep apnea 03/25/2007  . Eczema 02/21/2007  . OBESITY NOS 01/21/2007  . Asthma 01/21/2007    Current Outpatient Medications on File Prior to Visit  Medication Sig Dispense Refill  . albuterol (PROVENTIL HFA;VENTOLIN HFA) 108 (90 Base) MCG/ACT inhaler Inhale 2 puffs into the lungs every 6 (six) hours as needed for wheezing. 1 Inhaler 8  . hydrochlorothiazide (HYDRODIURIL) 25 MG tablet Take 1 tablet (25 mg total) by mouth daily. 30 tablet 5  . montelukast (SINGULAIR) 10 MG tablet Take 1 tablet (10 mg total) by mouth at bedtime. 30 tablet 5  . ondansetron (ZOFRAN) 4 MG tablet Take 1 tablet (4 mg total) by mouth every 6 (six) hours. 8  tablet 0  . triamcinolone cream (KENALOG) 0.1 % Apply 1 application topically 2 (two) times daily. 30 g 5   No current facility-administered medications on file prior to visit.     Past Medical History:  Diagnosis Date  . Anxiety    no official dx  . Asthma   . Eczema   . Fibroids   . GONORRHEA 03/25/2008   Qualifier: Diagnosis of  By: Isla Pence    . Heart murmur   . Heart murmur   . Hypertension   . MRSA (methicillin resistant staph aureus) culture positive 05/2012   cellulits/ left leg  . Obesity     Past Surgical History:  Procedure Laterality Date  . NO PAST SURGERIES      Social History   Socioeconomic History  . Marital status: Single    Spouse name: None  . Number of children: None  . Years of education: None  . Highest education level: None  Social Needs  . Financial resource strain: None  . Food insecurity - worry: None  . Food insecurity - inability: None  . Transportation needs - medical: None  . Transportation needs - non-medical: None  Occupational History  . None  Tobacco Use  . Smoking status: Current Every Day Smoker    Packs/day: 0.20    Years: 5.00    Pack years: 1.00    Types: Cigarettes  . Smokeless tobacco: Never Used  Substance and Sexual Activity  . Alcohol use: Yes    Alcohol/week: 0.0 oz  Comment: occ  . Drug use: Yes    Types: Marijuana    Comment: pot-about 1 joint BID  . Sexual activity: Yes    Birth control/protection: Condom  Other Topics Concern  . None  Social History Narrative  . None    Family History  Problem Relation Age of Onset  . Heart disease Maternal Grandmother   . Diabetes Maternal Grandmother   . Diabetes Father   . Diabetes Mother   . Cancer Maternal Uncle        prostate  . Cancer Paternal Aunt        lung  . Stroke Maternal Grandfather     Review of Systems  Constitutional: Positive for fever (intermittent at home).  HENT: Positive for congestion, ear pain, rhinorrhea, sinus  pressure, sore throat and voice change. Negative for sinus pain.   Respiratory: Positive for cough (dry), chest tightness, shortness of breath and wheezing.   Cardiovascular: Positive for chest pain.  Gastrointestinal: Positive for nausea. Negative for diarrhea (loose stools).  Musculoskeletal: Positive for myalgias.  Neurological: Positive for light-headedness and headaches.       Objective:   Vitals:   08/14/17 0757  BP: (!) 130/98  Pulse: 67  Resp: 16  Temp: 98.4 F (36.9 C)  SpO2: 98%   Filed Weights   08/14/17 0757  Weight: 254 lb (115.2 kg)   Body mass index is 43.6 kg/m.  Wt Readings from Last 3 Encounters:  08/14/17 254 lb (115.2 kg)  07/13/17 251 lb (113.9 kg)  04/24/17 262 lb 4 oz (119 kg)     Physical Exam GENERAL APPEARANCE: Appears stated age, well appearing, NAD EYES: conjunctiva clear, no icterus HEENT: bilateral tympanic membranes and ear canals normal, oropharynx with mild erythema, no thyromegaly, trachea midline, no cervical or supraclavicular lymphadenopathy LUNGS: Clear to auscultation without wheeze or crackles, unlabored breathing, good air entry bilaterally CARDIOVASCULAR: Normal S1,S2 without murmurs, no edema SKIN: warm, dry      Assessment & Plan:   See Problem List for Assessment and Plan of chronic medical problems.

## 2017-08-14 ENCOUNTER — Ambulatory Visit: Payer: BLUE CROSS/BLUE SHIELD | Admitting: Internal Medicine

## 2017-08-14 ENCOUNTER — Encounter: Payer: Self-pay | Admitting: Internal Medicine

## 2017-08-14 VITALS — BP 130/98 | HR 67 | Temp 98.4°F | Resp 16 | Wt 254.0 lb

## 2017-08-14 DIAGNOSIS — J069 Acute upper respiratory infection, unspecified: Secondary | ICD-10-CM | POA: Diagnosis not present

## 2017-08-14 DIAGNOSIS — J4531 Mild persistent asthma with (acute) exacerbation: Secondary | ICD-10-CM

## 2017-08-14 MED ORDER — ALBUTEROL SULFATE HFA 108 (90 BASE) MCG/ACT IN AERS: 2.0000 | INHALATION_SPRAY | Freq: Four times a day (QID) | RESPIRATORY_TRACT | 8 refills | Status: DC | PRN

## 2017-08-14 MED ORDER — BUDESONIDE-FORMOTEROL FUMARATE 80-4.5 MCG/ACT IN AERO: 2.0000 | INHALATION_SPRAY | Freq: Two times a day (BID) | RESPIRATORY_TRACT | 3 refills | Status: DC

## 2017-08-14 MED ORDER — BENZONATATE 200 MG PO CAPS: 200.0000 mg | ORAL_CAPSULE | Freq: Three times a day (TID) | ORAL | 0 refills | Status: DC | PRN

## 2017-08-14 MED ORDER — MONTELUKAST SODIUM 10 MG PO TABS: 10.0000 mg | ORAL_TABLET | Freq: Every day | ORAL | 5 refills | Status: DC

## 2017-08-14 MED ORDER — AZITHROMYCIN 250 MG PO TABS: ORAL_TABLET | ORAL | 0 refills | Status: DC

## 2017-08-14 NOTE — Assessment & Plan Note (Signed)
Mild asthma exac - lungs sound clear Start symbicort -has not been using Albuterol as needed Tessalon perles Call if no improvement

## 2017-08-14 NOTE — Patient Instructions (Addendum)
Stop smoking!  Take the antibiotic, cough medication, use your inhalers and take singulair.  Continue the over- the -counter medication.   Call if no improvement     Your prescription(s) have been submitted to your pharmacy or been printed and provided for you. Please take as directed and contact our office if you believe you are having problem(s) with the medication(s) or have any questions.  If your symptoms worsen or fail to improve, please contact our office for further instruction, or in case of emergency go directly to the emergency room at the closest medical facility.   General Recommendations:    Please drink plenty of fluids.  Get plenty of rest   Sleep in humidified air  Use saline nasal sprays  Netti pot  OTC Medications:  Decongestants - helps relieve congestion   Flonase (generic fluticasone) or Nasacort (generic triamcinolone) - please make sure to use the "cross-over" technique at a 45 degree angle towards the opposite eye as opposed to straight up the nasal passageway.   Sudafed (generic pseudoephedrine - Note this is the one that is available behind the pharmacy counter); Products with phenylephrine (-PE) may also be used but is often not as effective as pseudoephedrine.   If you have HIGH BLOOD PRESSURE - Coricidin HBP; AVOID any product that is -D as this contains pseudoephedrine which may increase your blood pressure.  Afrin (oxymetazoline) every 6-8 hours for up to 3 days.  Allergies - helps relieve runny nose, itchy eyes and sneezing   Claritin (generic loratidine), Allegra (fexofenidine), or Zyrtec (generic cyrterizine) for runny nose. These medications should not cause drowsiness.  Note - Benadryl (generic diphenhydramine) may be used however may cause drowsiness  Cough -   Delsym or Robitussin (generic dextromethorphan)  Expectorants - helps loosen mucus to ease removal   Mucinex (generic guaifenesin) as directed on the  package.  Headaches / General Aches   Tylenol (generic acetaminophen) - DO NOT EXCEED 3 grams (3,000 mg) in a 24 hour time period  Advil/Motrin (generic ibuprofen)  Sore Throat -   Salt water gargle   Chloraseptic (generic benzocaine) spray or lozenges / Sucrets (generic dyclonine)

## 2017-08-14 NOTE — Assessment & Plan Note (Signed)
Start zpak, inhalers, tessalon, otc cold medications Rest, fluids Call if no improvement

## 2017-09-18 ENCOUNTER — Emergency Department (HOSPITAL_COMMUNITY)
Admission: EM | Admit: 2017-09-18 | Discharge: 2017-09-18 | Disposition: A | Discharge status: Home / Self Care | Payer: BLUE CROSS/BLUE SHIELD | Attending: Emergency Medicine | Admitting: Emergency Medicine

## 2017-09-18 ENCOUNTER — Other Ambulatory Visit: Payer: Self-pay

## 2017-09-18 ENCOUNTER — Encounter (HOSPITAL_COMMUNITY): Payer: Self-pay | Admitting: Emergency Medicine

## 2017-09-18 DIAGNOSIS — M545 Low back pain, unspecified: Secondary | ICD-10-CM

## 2017-09-18 DIAGNOSIS — Z79899 Other long term (current) drug therapy: Secondary | ICD-10-CM | POA: Diagnosis not present

## 2017-09-18 DIAGNOSIS — J45909 Unspecified asthma, uncomplicated: Secondary | ICD-10-CM | POA: Diagnosis not present

## 2017-09-18 DIAGNOSIS — I10 Essential (primary) hypertension: Secondary | ICD-10-CM | POA: Insufficient documentation

## 2017-09-18 DIAGNOSIS — F17210 Nicotine dependence, cigarettes, uncomplicated: Secondary | ICD-10-CM | POA: Insufficient documentation

## 2017-09-18 MED ORDER — METHOCARBAMOL 500 MG PO TABS: 500.0000 mg | ORAL_TABLET | Freq: Two times a day (BID) | ORAL | 0 refills | Status: DC

## 2017-09-18 NOTE — ED Provider Notes (Signed)
Patient placed in Quick Look pathway, seen and evaluated   Chief Complaint: Back Pain  HPI:   37 y.o. F who presents for evaluation of 3 weeks of back pain to the left side.  Reports it is worse with movement.  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  Has been taking Advil for pain.  Reports minimal improvement.  Reports pain is worsened with ambulation. Denies fevers, weight loss, numbness/weakness of upper and lower extremities, bowel/bladder incontinence, saddle anesthesia, history of back surgery, history of IVDA.   ROS: Back pain   Physical Exam:   Gen: No distress  Neuro: Awake and Alert  Skin: Warm    Focused Exam: Diffuse muscular tenderness overlying the left paraspinal muscles of the lumbar region.  Follows commands, Moves all extremities   5/5 strength to BUE and BLE   Sensation intact throughout all major nerve distributions    Initiation of care has begun. The patient has been counseled on the process, plan, and necessity for staying for the completion/evaluation, and the remainder of the medical screening examination    Volanda Napoleon, PA-C 09/18/17 1356    Lajean Saver, MD 09/19/17 1247

## 2017-09-18 NOTE — ED Triage Notes (Addendum)
Patient complains of back pain that started a couple of weeks ago, states she thinks she has a pinched nerve. States that when pain began a few weeks ago she was able to relieve pain by stretching but that stretching no longer works.  Denies any other complaints, no neuro deficits.

## 2017-09-18 NOTE — Discharge Instructions (Addendum)
Please read attached information. If you experience any new or worsening signs or symptoms please return to the emergency room for evaluation. Please follow-up with your primary care provider or specialist as discussed. Please use medication prescribed only as directed and discontinue taking if you have any concerning signs or symptoms.   °

## 2017-09-27 NOTE — ED Provider Notes (Signed)
[facility] EMERGENCY DEPARTMENT Provider Note   CSN: [id number] Arrival date & time: 09/18/17  1335     History   Chief Complaint Chief Complaint  Patient presents with  . Back Pain    HPI Marissa Joseph is a 37 y.o. female.  HPI   37 year old female presents today with complaints of back pain.  Patient reports 3-week history of back pain to the left lower lumbar region.  She notes this is worse with movement.  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  Patient notes Advil at home has not improved her symptoms.  Patient denies any distal neurological deficits, abdominal pain, fever, trauma, or any other red flags.  Past Medical History:  Diagnosis Date  . Anxiety    no official dx  . Asthma   . Eczema   . Fibroids   . GONORRHEA 03/25/2008   Qualifier: Diagnosis of  By: Isla Pence    . Heart murmur   . Heart murmur   . Hypertension   . MRSA (methicillin resistant staph aureus) culture positive 05/2012   cellulits/ left leg  . Obesity     Patient Active Problem List   Diagnosis Date Noted  . Upper respiratory tract infection 08/14/2017  . Mild persistent asthma with exacerbation 08/14/2017  . Ventral hernia without obstruction or gangrene 07/13/2017  . Hearing difficulty 11/24/2016  . Prediabetes 09/06/2015  . Essential hypertension, benign 09/06/2015  . Smoking 12/22/2013  . 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 Osteoarthritis of left knee 08/29/2013  . Pap smear of cervix with ASCUS, cannot exclude HGSIL 06/25/2013  . Fibroid uterus 05/22/2013  . Dysmenorrhea 05/22/2013  . Monilial intertrigo 01/06/2013  . ALLERGIC RHINITIS 04/10/2008  . HEEL PAIN, LEFT 11/22/2007  . DEPENDENT EDEMA, LEGS, BILATERAL 08/06/2007  . Obstructive sleep apnea 03/25/2007  . Eczema 02/21/2007  . OBESITY NOS 01/21/2007  . Asthma 01/21/2007    Past Surgical History:    Procedure Laterality Date  . NO PAST SURGERIES       OB History    Gravida  0   Para      Term      Preterm      AB      Living        SAB      TAB      Ectopic      Multiple      Live Births               Home Medications    Prior to Admission medications   Medication Sig Start Date End Date Taking? Authorizing Provider  albuterol (PROVENTIL HFA;VENTOLIN HFA) 108 (90 Base) MCG/ACT inhaler Inhale 2 puffs into the lungs every 6 (six) hours as needed for wheezing. 08/14/17   Binnie Rail, MD  azithromycin (ZITHROMAX) 250 MG tablet Take two tabs the first day and then one tab daily for four days 08/14/17   Binnie Rail, MD  benzonatate (TESSALON) 200 MG capsule Take 1 capsule (200 mg total) by mouth 3 (three) times daily as needed for cough. 08/14/17   Binnie Rail, MD  budesonide-formoterol (SYMBICORT) 80-4.5 MCG/ACT inhaler Inhale 2 puffs into the lungs 2 (two) times daily. 08/14/17   Binnie Rail, MD  hydrochlorothiazide (HYDRODIURIL) 25 MG tablet Take 1 tablet (25 mg total) by mouth daily. 07/13/17   Binnie Rail, MD  methocarbamol (ROBAXIN) 500  MG tablet Take 1 tablet (500 mg total) by mouth 2 (two) times daily. 09/18/17   Caliber Landess, Dellis Filbert, PA-C  montelukast (SINGULAIR) 10 MG tablet Take 1 tablet (10 mg total) by mouth at bedtime. 08/14/17   Binnie Rail, MD  ondansetron (ZOFRAN) 4 MG tablet Take 1 tablet (4 mg total) by mouth every 6 (six) hours. 04/24/17   Nat Christen, MD  triamcinolone cream (KENALOG) 0.1 % Apply 1 application topically 2 (two) times daily. 09/06/15   Binnie Rail, MD    Family History Family History  Problem Relation Age of Onset  . Heart disease Maternal Grandmother   . Diabetes Maternal Grandmother   . Diabetes Father   . Diabetes Mother   . Cancer Maternal Uncle        prostate  . Cancer Paternal Aunt        lung  . Stroke Maternal Grandfather     Social History Social History   Tobacco Use  . Smoking status: Current  Every Day Smoker    Packs/day: 0.20    Years: 5.00    Pack years: 1.00    Types: Cigarettes  . Smokeless tobacco: Never Used  Substance Use Topics  . Alcohol use: Yes    Alcohol/week: 0.0 oz    Comment: occ  . Drug use: Yes    Types: Marijuana    Comment: pot-about 1 joint BID     Allergies   Avocado and Shellfish allergy   Review of Systems Review of Systems  All other systems reviewed and are negative.    Physical Exam Updated Vital Signs BP 136/87 (BP Location: Left Arm)   Pulse 67   Temp 97.8 F (36.6 C) (Oral)   Resp 16   SpO2 100%   Physical Exam  Constitutional: She is oriented to person, place, and time. She appears well-developed and well-nourished.  HENT:  Head: Normocephalic and atraumatic.  Eyes: Pupils are equal, round, and reactive to light. Conjunctivae are normal. Right eye exhibits no discharge. Left eye exhibits no discharge. No scleral icterus.  Neck: Normal range of motion. No JVD present. No tracheal deviation present.  Pulmonary/Chest: Effort normal. No stridor.  Musculoskeletal:  Tenderness palpation left lower lumbar musculature, no midline tenderness distal sensation strength and motor function intact  Neurological: She is alert and oriented to person, place, and time. Coordination normal.  Psychiatric: She has a normal mood and affect. Her behavior is normal. Judgment and thought content normal.  Nursing note and vitals reviewed.   ED Treatments / Results  Labs (all labs ordered are listed, but only abnormal results are displayed) Labs Reviewed - No data to display  EKG None  Radiology No results found.  Procedures Procedures (including critical care time)  Medications Ordered in ED Medications - No data to display   Initial Impression / Assessment and Plan / ED Course  I have reviewed the triage vital signs and the nursing notes.  Pertinent labs & imaging results that were available during my care of the patient were  reviewed by me and considered in my medical decision making (see chart for details).     Patient with uncomplicated back pain likely muscular nature discharged home with muscle relaxers symptomatic care and strict return cautions.  Patient verbalized understanding and agreement to today's plan had no further questions or concerns the time discharge.  Final Clinical Impressions(s) / ED Diagnoses   Final diagnoses:  Acute left-sided low back pain without sciatica    ED  Discharge Orders        Ordered    methocarbamol (ROBAXIN) 500 MG tablet  2 times daily     09/18/17 1725       Francee Gentile 09/27/17 2215    Carmin Muskrat, MD 09/29/17 1501

## 2017-10-09 ENCOUNTER — Encounter: Payer: Self-pay | Admitting: Internal Medicine

## 2017-10-10 ENCOUNTER — Encounter: Payer: Self-pay | Admitting: Internal Medicine

## 2017-10-10 ENCOUNTER — Ambulatory Visit (INDEPENDENT_AMBULATORY_CARE_PROVIDER_SITE_OTHER): Payer: BLUE CROSS/BLUE SHIELD | Admitting: Internal Medicine

## 2017-10-10 VITALS — BP 142/96 | HR 79 | Temp 98.9°F | Resp 16 | Wt 248.0 lb

## 2017-10-10 DIAGNOSIS — I10 Essential (primary) hypertension: Secondary | ICD-10-CM | POA: Diagnosis not present

## 2017-10-10 DIAGNOSIS — N912 Amenorrhea, unspecified: Secondary | ICD-10-CM

## 2017-10-10 LAB — POCT URINE PREGNANCY: Preg Test, Ur: NEGATIVE

## 2017-10-10 MED ORDER — OLMESARTAN MEDOXOMIL 20 MG PO TABS: 20.0000 mg | ORAL_TABLET | Freq: Every day | ORAL | 5 refills | Status: DC

## 2017-10-10 NOTE — Assessment & Plan Note (Addendum)
L last menses approximately 1 month ago She is sexually active and has not used protection Possibility of being pregnant-urine pregnancy test today, which is negative Advised that if she plans or thinks she may get pregnant and we need to switch her medications

## 2017-10-10 NOTE — Progress Notes (Signed)
Subjective:    Patient ID: Marissa Joseph, female    DOB: 09-02-1980, 37 y.o.   MRN: [id number]  HPI The patient is here for an acute visit.  Elevated blood pressure, headache: At 2am she work up with a headache in the back of her head.  Her BP later was 178/108.  Nothing was helping the headache.  She took her bp medication when she got home later than day. bp last night was 153/83.    Taking hctz daily, may have missed one day.  She may be consuming more sodium than she should.  She is active at work, but not exercising outside of work.  She is still smoking.  Besides headache she feels lightheaded at times.  She also states some shortness of breath and tightness in her upper chest and neck area.  She denies any specific chest pain or palpitations.  She has experienced some mild leg edema.  She finds a better controlled when she takes hydrochlorothiazide on a regular basis.  She is not actively trying to get pregnant, but states there is a possibility she could be pregnant.  Medications and allergies reviewed with patient and updated if appropriate.  Patient Active Problem List   Diagnosis Date Noted  . Upper respiratory tract infection 08/14/2017  . Mild persistent asthma with exacerbation 08/14/2017  . Ventral hernia without obstruction or gangrene 07/13/2017  . Hearing difficulty 11/24/2016  . Prediabetes 09/06/2015  . Essential hypertension, benign 09/06/2015  . Smoking 12/22/2013  . 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 Osteoarthritis of left knee 08/29/2013  . Pap smear of cervix with ASCUS, cannot exclude HGSIL 06/25/2013  . Fibroid uterus 05/22/2013  . Dysmenorrhea 05/22/2013  . Monilial intertrigo 01/06/2013  . ALLERGIC RHINITIS 04/10/2008  . HEEL PAIN, LEFT 11/22/2007  . DEPENDENT EDEMA, LEGS, BILATERAL 08/06/2007  . Obstructive sleep apnea 03/25/2007  . Eczema 02/21/2007  . OBESITY NOS 01/21/2007  . Asthma  01/21/2007    Current Outpatient Medications on File Prior to Visit  Medication Sig Dispense Refill  . albuterol (PROVENTIL HFA;VENTOLIN HFA) 108 (90 Base) MCG/ACT inhaler Inhale 2 puffs into the lungs every 6 (six) hours as needed for wheezing. 1 Inhaler 8  . budesonide-formoterol (SYMBICORT) 80-4.5 MCG/ACT inhaler Inhale 2 puffs into the lungs 2 (two) times daily. 1 Inhaler 3  . hydrochlorothiazide (HYDRODIURIL) 25 MG tablet Take 1 tablet (25 mg total) by mouth daily. 30 tablet 5  . methocarbamol (ROBAXIN) 500 MG tablet Take 1 tablet (500 mg total) by mouth 2 (two) times daily. 20 tablet 0  . montelukast (SINGULAIR) 10 MG tablet Take 1 tablet (10 mg total) by mouth at bedtime. 30 tablet 5  . ondansetron (ZOFRAN) 4 MG tablet Take 1 tablet (4 mg total) by mouth every 6 (six) hours. 8 tablet 0  . triamcinolone cream (KENALOG) 0.1 % Apply 1 application topically 2 (two) times daily. 30 g 5   No current facility-administered medications on file prior to visit.     Past Medical History:  Diagnosis Date  . Anxiety    no official dx  . Asthma   . Eczema   . Fibroids   . GONORRHEA 03/25/2008   Qualifier: Diagnosis of  By: Isla Pence    . Heart murmur   . Heart murmur   . Hypertension   . MRSA (methicillin resistant staph aureus) culture positive 05/2012   cellulits/ left leg  . Obesity  Past Surgical History:  Procedure Laterality Date  . NO PAST SURGERIES      Social History   Socioeconomic History  . Marital status: Single    Spouse name: Not on file  . Number of children: Not on file  . Years of education: Not on file  . Highest education level: Not on file  Occupational History  . Not on file  Social Needs  . Financial resource strain: Not on file  . Food insecurity:    Worry: Not on file    Inability: Not on file  . Transportation needs:    Medical: Not on file    Non-medical: Not on file  Tobacco Use  . Smoking status: Current Every Day Smoker     Packs/day: 0.20    Years: 5.00    Pack years: 1.00    Types: Cigarettes  . Smokeless tobacco: Never Used  Substance and Sexual Activity  . Alcohol use: Yes    Alcohol/week: 0.0 oz    Comment: occ  . Drug use: Yes    Types: Marijuana    Comment: pot-about 1 joint BID  . Sexual activity: Yes    Birth control/protection: Condom  Lifestyle  . Physical activity:    Days per week: Not on file    Minutes per session: Not on file  . Stress: Not on file  Relationships  . Social connections:    Talks on phone: Not on file    Gets together: Not on file    Attends religious service: Not on file    Active member of club or organization: Not on file    Attends meetings of clubs or organizations: Not on file    Relationship status: Not on file  Other Topics Concern  . Not on file  Social History Narrative  . Not on file    Family History  Problem Relation Age of Onset  . Heart disease Maternal Grandmother   . Diabetes Maternal Grandmother   . Diabetes Father   . Diabetes Mother   . Cancer Maternal Uncle        prostate  . Cancer Paternal Aunt        lung  . Stroke Maternal Grandfather     Review of Systems  Constitutional: Negative for chills and fever.  Respiratory: Positive for shortness of breath. Negative for cough and wheezing.   Cardiovascular: Positive for leg swelling. Negative for chest pain and palpitations.  Neurological: Positive for light-headedness and headaches.       Objective:   Vitals:   10/10/17 1059  BP: (!) 142/96  Pulse: 79  Resp: 16  Temp: 98.9 F (37.2 C)  SpO2: 98%   BP Readings from Last 3 Encounters:  10/10/17 (!) 142/96  09/18/17 136/87  08/14/17 (!) 130/98   Wt Readings from Last 3 Encounters:  10/10/17 248 lb (112.5 kg)  08/14/17 254 lb (115.2 kg)  07/13/17 251 lb (113.9 kg)   Body mass index is 42.57 kg/m.   Physical Exam    Constitutional: Appears well-developed and well-nourished. No distress.  HENT:  Head:  Normocephalic and atraumatic.  Neck: Neck supple. No tracheal deviation present. No thyromegaly present.  No cervical lymphadenopathy Cardiovascular: Normal rate, regular rhythm and normal heart sounds.   No murmur heard. No carotid bruit .  No edema Pulmonary/Chest: Effort normal and breath sounds normal. No respiratory distress. No has no wheezes. No rales.  Skin: Skin is warm and dry. Not diaphoretic.  Psychiatric: Normal mood  and affect. Behavior is normal.       Assessment & Plan:    See Problem List for Assessment and Plan of chronic medical problems.

## 2017-10-10 NOTE — Assessment & Plan Note (Signed)
Blood pressure not well controlled Stressed the importance of taking her hydrochlorothiazide on a daily basis Stressed better compliance with a low-sodium diet Stressed smoking cessation Encouraged exercise outside of work Stressed the importance of weight loss Add olmesartan 20 mg daily-advised if this is too expensive to call and we will get an alternative Follow-up in 1 month She will continue to monitor blood pressure at home-discussed goal of less than 130/80

## 2017-10-10 NOTE — Patient Instructions (Addendum)
Your goal BP is < 130/80  Monitor your BP at home.    Medications reviewed and updated.  Changes include starting benicar for your blood pressure.   Your prescription(s) have been submitted to your pharmacy. Please take as directed and contact our office if you believe you are having problem(s) with the medication(s).   Please followup in 1 month

## 2017-10-10 NOTE — Telephone Encounter (Signed)
Appt scheduled

## 2017-11-08 NOTE — Progress Notes (Signed)
Subjective:    Patient ID: Marissa Joseph, female    DOB: 04-Dec-1980, 37 y.o.   MRN: [id number]  HPI The patient is here for follow up.  Hypertension: We added Benicar one month ago, but she states she never picked this up.  She is waiting to get enough money so that she was able to pick it up.  She has been taking hydrochlorothiazide on a daily basis, which she was not doing previously.   She is compliant with a low sodium diet.  She denies chest pain, palpitations, regular shortness of breath and regular headaches. She is not exercising regularly.  She does not monitor her blood pressure at home.    Prediabetes:  She is compliant with a low sugar/carbohydrate diet.  She is very active, but not exercising regularly.   Medications and allergies reviewed with patient and updated if appropriate.  Patient Active Problem List   Diagnosis Date Noted  . Amenorrhea 10/10/2017  . Mild persistent asthma with exacerbation 08/14/2017  . Ventral hernia without obstruction or gangrene 07/13/2017  . Hearing difficulty 11/24/2016  . Prediabetes 09/06/2015  . Essential hypertension, benign 09/06/2015  . Smoking 12/22/2013  . 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 Osteoarthritis of left knee 08/29/2013  . Pap smear of cervix with ASCUS, cannot exclude HGSIL 06/25/2013  . Fibroid uterus 05/22/2013  . Dysmenorrhea 05/22/2013  . Monilial intertrigo 01/06/2013  . ALLERGIC RHINITIS 04/10/2008  . HEEL PAIN, LEFT 11/22/2007  . DEPENDENT EDEMA, LEGS, BILATERAL 08/06/2007  . Obstructive sleep apnea 03/25/2007  . Eczema 02/21/2007  . OBESITY NOS 01/21/2007  . Asthma 01/21/2007    Current Outpatient Medications on File Prior to Visit  Medication Sig Dispense Refill  . albuterol (PROVENTIL HFA;VENTOLIN HFA) 108 (90 Base) MCG/ACT inhaler Inhale 2 puffs into the lungs every 6 (six) hours as needed for wheezing. 1 Inhaler 8  . budesonide-formoterol  (SYMBICORT) 80-4.5 MCG/ACT inhaler Inhale 2 puffs into the lungs 2 (two) times daily. 1 Inhaler 3  . hydrochlorothiazide (HYDRODIURIL) 25 MG tablet Take 1 tablet (25 mg total) by mouth daily. 30 tablet 5  . methocarbamol (ROBAXIN) 500 MG tablet Take 1 tablet (500 mg total) by mouth 2 (two) times daily. 20 tablet 0  . montelukast (SINGULAIR) 10 MG tablet Take 1 tablet (10 mg total) by mouth at bedtime. 30 tablet 5  . olmesartan (BENICAR) 20 MG tablet Take 1 tablet (20 mg total) by mouth daily. 30 tablet 5  . penicillin v potassium (VEETID) 500 MG tablet   0  . triamcinolone cream (KENALOG) 0.1 % Apply 1 application topically 2 (two) times daily. 30 g 5   No current facility-administered medications on file prior to visit.     Past Medical History:  Diagnosis Date  . Anxiety    no official dx  . Asthma   . Eczema   . Fibroids   . GONORRHEA 03/25/2008   Qualifier: Diagnosis of  By: Isla Pence    . Heart murmur   . Heart murmur   . Hypertension   . MRSA (methicillin resistant staph aureus) culture positive 05/2012   cellulits/ left leg  . Obesity     Past Surgical History:  Procedure Laterality Date  . NO PAST SURGERIES      Social History   Socioeconomic History  . Marital status: Single    Spouse name: Not on file  . Number of children: Not on file  .  Years of education: Not on file  . Highest education level: Not on file  Occupational History  . Not on file  Social Needs  . Financial resource strain: Not on file  . Food insecurity:    Worry: Not on file    Inability: Not on file  . Transportation needs:    Medical: Not on file    Non-medical: Not on file  Tobacco Use  . Smoking status: Current Every Day Smoker    Packs/day: 0.20    Years: 5.00    Pack years: 1.00    Types: Cigarettes  . Smokeless tobacco: Never Used  Substance and Sexual Activity  . Alcohol use: Yes    Alcohol/week: 0.0 oz    Comment: occ  . Drug use: Yes    Types: Marijuana     Comment: pot-about 1 joint BID  . Sexual activity: Yes    Birth control/protection: Condom  Lifestyle  . Physical activity:    Days per week: Not on file    Minutes per session: Not on file  . Stress: Not on file  Relationships  . Social connections:    Talks on phone: Not on file    Gets together: Not on file    Attends religious service: Not on file    Active member of club or organization: Not on file    Attends meetings of clubs or organizations: Not on file    Relationship status: Not on file  Other Topics Concern  . Not on file  Social History Narrative  . Not on file    Family History  Problem Relation Age of Onset  . Heart disease Maternal Grandmother   . Diabetes Maternal Grandmother   . Diabetes Father   . Diabetes Mother   . Cancer Maternal Uncle        prostate  . Cancer Paternal Aunt        lung  . Stroke Maternal Grandfather     Review of Systems  Constitutional: Negative for fever.  Respiratory: Positive for shortness of breath (with changes in weather) and wheezing (with changes in weather). Negative for cough.   Cardiovascular: Positive for leg swelling (mild ). Negative for chest pain and palpitations.  Neurological: Positive for light-headedness (mild at times- rare) and headaches (occasional only).       Objective:   Vitals:   11/09/17 1125  BP: 124/86  Pulse: 69  Resp: 16  Temp: 98.4 F (36.9 C)  SpO2: 98%   BP Readings from Last 3 Encounters:  11/09/17 124/86  10/10/17 (!) 142/96  09/18/17 136/87   Wt Readings from Last 3 Encounters:  11/09/17 255 lb (115.7 kg)  10/10/17 248 lb (112.5 kg)  08/14/17 254 lb (115.2 kg)   Body mass index is 43.77 kg/m.   Physical Exam    Constitutional: Appears well-developed and well-nourished. No distress.  HENT:  Head: Normocephalic and atraumatic.  Neck: Neck supple. No tracheal deviation present. No thyromegaly present.  No cervical lymphadenopathy Cardiovascular: Normal rate, regular  rhythm and normal heart sounds.   No murmur heard. No carotid bruit .  Mild edema left lower extremity with chronic skin changes - hyperpigmentation and skin thickening, trace edema right lower extremity with mild hyperpigmentation Pulmonary/Chest: Effort normal and breath sounds normal. No respiratory distress. No has no wheezes. No rales.  Skin: Skin is warm and dry. Not diaphoretic.  Psychiatric: Normal mood and affect. Behavior is normal.      Assessment & Plan:  See Problem List for Assessment and Plan of chronic medical problems.

## 2017-11-09 ENCOUNTER — Encounter: Payer: Self-pay | Admitting: Internal Medicine

## 2017-11-09 ENCOUNTER — Ambulatory Visit: Payer: BLUE CROSS/BLUE SHIELD | Admitting: Internal Medicine

## 2017-11-09 ENCOUNTER — Other Ambulatory Visit (INDEPENDENT_AMBULATORY_CARE_PROVIDER_SITE_OTHER): Payer: BLUE CROSS/BLUE SHIELD

## 2017-11-09 VITALS — BP 124/86 | HR 69 | Temp 98.4°F | Resp 16 | Wt 255.0 lb

## 2017-11-09 DIAGNOSIS — R7303 Prediabetes: Secondary | ICD-10-CM | POA: Diagnosis not present

## 2017-11-09 DIAGNOSIS — Z23 Encounter for immunization: Secondary | ICD-10-CM | POA: Diagnosis not present

## 2017-11-09 DIAGNOSIS — I10 Essential (primary) hypertension: Secondary | ICD-10-CM | POA: Diagnosis not present

## 2017-11-09 LAB — COMPREHENSIVE METABOLIC PANEL
ALT: 10 U/L (ref 0–35)
AST: 13 U/L (ref 0–37)
Albumin: 3.6 g/dL (ref 3.5–5.2)
Alkaline Phosphatase: 54 U/L (ref 39–117)
BUN: 19 mg/dL (ref 6–23)
CHLORIDE: 106 meq/L (ref 96–112)
CO2: 27 meq/L (ref 19–32)
Calcium: 8.9 mg/dL (ref 8.4–10.5)
Creatinine, Ser: 0.84 mg/dL (ref 0.40–1.20)
GFR: 98.35 mL/min (ref >60.00)
Glucose, Bld: 77 mg/dL (ref 70–99)
Potassium: 4 mEq/L (ref 3.5–5.1)
Sodium: 139 mEq/L (ref 135–145)
Total Bilirubin: 0.3 mg/dL (ref 0.2–1.2)
Total Protein: 7 g/dL (ref 6.0–8.3)

## 2017-11-09 LAB — HEMOGLOBIN A1C: Hgb A1c MFr Bld: 5.2 % (ref 4.6–6.5)

## 2017-11-09 MED ORDER — OLMESARTAN MEDOXOMIL 5 MG PO TABS
5.0000 mg | ORAL_TABLET | Freq: Every day | ORAL | 5 refills | Status: DC
Start: 2017-11-09 — End: 2018-05-10

## 2017-11-09 NOTE — Patient Instructions (Addendum)
  Test(s) ordered today. Your results will be released to [facility] (or called to you) after review, usually within 72hours after test completion. If any changes need to be made, you will be notified at that same time.   tetanus administered today.   Medications reviewed and updated.  Changes include starting olmesartan 5 mg daily.  Your prescription(s) have been submitted to your pharmacy. Please take as directed and contact our office if you believe you are having problem(s) with the medication(s).    Please followup in 6 months

## 2017-11-09 NOTE — Assessment & Plan Note (Signed)
Blood pressure much better She is not monitoring it herself and is hard to know what degree of control she has Continue hydrochlorothiazide 25 mg daily We will change the Benicar to 5 mg daily-she will pick up today Continue low-sodium diet Work on increasing exercise and weight loss CMP

## 2017-11-09 NOTE — Assessment & Plan Note (Signed)
Check a1c Low sugar / carb diet Stressed regular exercise, weight loss  

## 2017-12-10 ENCOUNTER — Encounter: Payer: Self-pay | Admitting: Internal Medicine

## 2018-05-09 NOTE — Patient Instructions (Addendum)
  Tests ordered today. Your results will be released to [facility] (or called to you) after review, usually within 72hours after test completion. If any changes need to be made, you will be notified at that same time.   Medications reviewed and updated.  Changes include :   Wellbutrin 150 mg daily in the morning  Your prescription(s) have been submitted to your pharmacy. Please take as directed and contact our office if you believe you are having problem(s) with the medication(s).  Please followup in 6 months   [place]  [place], [place] [address]  Main: [place] [address] [place], [place] [address] [phone]   [facility] [address] [place], [place] Across the street from [facility] [phone]

## 2018-05-09 NOTE — Progress Notes (Signed)
Subjective:    Patient ID: Marissa Joseph, female    DOB: [birth year], 37 y.o.   MRN: [id number]  HPI The patient is here for follow up.  Hypertension: She was out of her BP medications for one month - she restarted them yesterday.  She is taking her medication daily. She is compliant with a low sodium diet.  She denies chest pain, palpitations, shortness of breath and regular headaches. She is active, but not exercising regularly.  She does not monitor her blood pressure at home.    Prediabetes:  She is not compliant with a low sugar/carbohydrate diet.  She is not exercising regularly, but walks all day at work.  She has gained 32 lbs since June.    Asthma: Her asthma is mild and intermittent.  She uses the Symbicort twice daily as needed only.  She uses the albuterol inhaler as needed.  She is taking singular daily.  She feels her asthma is currently well controlled.  Medications and allergies reviewed with patient and updated if appropriate.  Patient Active Problem List   Diagnosis Date Noted  . Amenorrhea 10/10/2017  . Ventral hernia without obstruction or gangrene 07/13/2017  . Hearing difficulty 11/24/2016  . Prediabetes 09/06/2015  . Essential hypertension, benign 09/06/2015  . Smoking 12/22/2013  . Osteoarthritis of left knee 08/29/2013  . Pap smear of cervix with ASCUS, cannot exclude HGSIL 06/25/2013  . Fibroid uterus 05/22/2013  . Dysmenorrhea 05/22/2013  . Monilial intertrigo 01/06/2013  . ALLERGIC RHINITIS 04/10/2008  . HEEL PAIN, LEFT 11/22/2007  . DEPENDENT EDEMA, LEGS, BILATERAL 08/06/2007  . Obstructive sleep apnea 03/25/2007  . Eczema 02/21/2007  . OBESITY NOS 01/21/2007  . Asthma 01/21/2007    Current Outpatient Medications on File Prior to Visit  Medication Sig Dispense Refill  . albuterol (PROVENTIL HFA;VENTOLIN HFA) 108 (90 Base) MCG/ACT inhaler Inhale 2 puffs into the lungs every 6 (six) hours as needed for wheezing. 1 Inhaler 8  .  budesonide-formoterol (SYMBICORT) 80-4.5 MCG/ACT inhaler Inhale 2 puffs into the lungs 2 (two) times daily. 1 Inhaler 3  . hydrochlorothiazide (HYDRODIURIL) 25 MG tablet Take 1 tablet (25 mg total) by mouth daily. 30 tablet 5  . methocarbamol (ROBAXIN) 500 MG tablet Take 1 tablet (500 mg total) by mouth 2 (two) times daily. 20 tablet 0  . montelukast (SINGULAIR) 10 MG tablet Take 1 tablet (10 mg total) by mouth at bedtime. 30 tablet 5  . olmesartan (BENICAR) 5 MG tablet Take 1 tablet (5 mg total) by mouth daily. 30 tablet 5  . penicillin v potassium (VEETID) 500 MG tablet   0  . triamcinolone cream (KENALOG) 0.1 % Apply 1 application topically 2 (two) times daily. 30 g 5   No current facility-administered medications on file prior to visit.     Past Medical History:  Diagnosis Date  . Anxiety    no official dx  . Asthma   . Eczema   . Fibroids   . GONORRHEA 03/25/2008   Qualifier: Diagnosis of  By: Isla Pence    . Heart murmur   . Heart murmur   . Hypertension   . MRSA (methicillin resistant staph aureus) culture positive 05/2012   cellulits/ left leg  . Obesity     Past Surgical History:  Procedure Laterality Date  . NO PAST SURGERIES      Social History   Socioeconomic History  . Marital status: Single    Spouse name: Not on file  . Number  of children: Not on file  . Years of education: Not on file  . Highest education level: Not on file  Occupational History  . Not on file  Social Needs  . Financial resource strain: Not on file  . Food insecurity:    Worry: Not on file    Inability: Not on file  . Transportation needs:    Medical: Not on file    Non-medical: Not on file  Tobacco Use  . Smoking status: Current Every Day Smoker    Packs/day: 0.20    Years: 5.00    Pack years: 1.00    Types: Cigarettes  . Smokeless tobacco: Never Used  Substance and Sexual Activity  . Alcohol use: Yes    Alcohol/week: 0.0 standard drinks    Comment: occ  . Drug use:  Yes    Types: Marijuana    Comment: pot-about 1 joint BID  . Sexual activity: Yes    Birth control/protection: Condom  Lifestyle  . Physical activity:    Days per week: Not on file    Minutes per session: Not on file  . Stress: Not on file  Relationships  . Social connections:    Talks on phone: Not on file    Gets together: Not on file    Attends religious service: Not on file    Active member of club or organization: Not on file    Attends meetings of clubs or organizations: Not on file    Relationship status: Not on file  Other Topics Concern  . Not on file  Social History Narrative  . Not on file    Family History  Problem Relation Age of Onset  . Heart disease Maternal Grandmother   . Diabetes Maternal Grandmother   . Diabetes Father   . Diabetes Mother   . Cancer Maternal Uncle        prostate  . Cancer Paternal Aunt        lung  . Stroke Maternal Grandfather     Review of Systems  Constitutional: Negative for chills and fever.  Respiratory: Positive for cough (with the cold weather). Negative for shortness of breath and wheezing.   Cardiovascular: Positive for leg swelling. Negative for chest pain and palpitations.  Neurological: Positive for headaches (occ). Negative for light-headedness.       Objective:   Vitals:   05/10/18 1525  BP: (!) 154/100  Pulse: 72  Resp: 16  Temp: 98.1 F (36.7 C)  SpO2: 97%   BP Readings from Last 3 Encounters:  05/10/18 (!) 154/100  11/09/17 124/86  10/10/17 (!) 142/96   Wt Readings from Last 3 Encounters:  05/10/18 287 lb (130.2 kg)  11/09/17 255 lb (115.7 kg)  10/10/17 248 lb (112.5 kg)   Body mass index is 49.26 kg/m.   Physical Exam    Constitutional: Appears well-developed and well-nourished. No distress.  HENT:  Head: Normocephalic and atraumatic.  Neck: Neck supple. No tracheal deviation present. No thyromegaly present.  No cervical lymphadenopathy Cardiovascular: Normal rate, regular rhythm and  normal heart sounds.   No murmur heard. No carotid bruit .  Mild edema b/l LE Pulmonary/Chest: Effort normal and breath sounds normal. No respiratory distress. No has no wheezes. No rales.  Skin: Skin is warm and dry. Not diaphoretic.  Psychiatric: Normal mood and affect. Behavior is normal.      Assessment & Plan:    See Problem List for Assessment and Plan of chronic medical problems.

## 2018-05-10 ENCOUNTER — Ambulatory Visit: Payer: BLUE CROSS/BLUE SHIELD | Admitting: Internal Medicine

## 2018-05-10 ENCOUNTER — Other Ambulatory Visit (INDEPENDENT_AMBULATORY_CARE_PROVIDER_SITE_OTHER): Payer: BLUE CROSS/BLUE SHIELD

## 2018-05-10 VITALS — BP 154/100 | HR 72 | Temp 98.1°F | Resp 16 | Ht 64.0 in | Wt 287.0 lb

## 2018-05-10 DIAGNOSIS — R7303 Prediabetes: Secondary | ICD-10-CM

## 2018-05-10 DIAGNOSIS — J4520 Mild intermittent asthma, uncomplicated: Secondary | ICD-10-CM

## 2018-05-10 DIAGNOSIS — F3289 Other specified depressive episodes: Secondary | ICD-10-CM

## 2018-05-10 DIAGNOSIS — I10 Essential (primary) hypertension: Secondary | ICD-10-CM

## 2018-05-10 DIAGNOSIS — F4323 Adjustment disorder with mixed anxiety and depressed mood: Secondary | ICD-10-CM | POA: Insufficient documentation

## 2018-05-10 DIAGNOSIS — F329 Major depressive disorder, single episode, unspecified: Secondary | ICD-10-CM | POA: Insufficient documentation

## 2018-05-10 LAB — COMPREHENSIVE METABOLIC PANEL
ALBUMIN: 3.8 g/dL (ref 3.5–5.2)
ALT: 15 U/L (ref 0–35)
AST: 16 U/L (ref 0–37)
Alkaline Phosphatase: 59 U/L (ref 39–117)
BUN: 19 mg/dL (ref 6–23)
CALCIUM: 9.2 mg/dL (ref 8.4–10.5)
CO2: 29 mEq/L (ref 19–32)
Chloride: 105 mEq/L (ref 96–112)
Creatinine, Ser: 0.8 mg/dL (ref 0.40–1.20)
GFR: 103.76 mL/min (ref >60.00)
Glucose, Bld: 78 mg/dL (ref 70–99)
Potassium: 3.8 mEq/L (ref 3.5–5.1)
Sodium: 138 mEq/L (ref 135–145)
Total Bilirubin: 0.3 mg/dL (ref 0.2–1.2)
Total Protein: 7.3 g/dL (ref 6.0–8.3)

## 2018-05-10 LAB — HEMOGLOBIN A1C: Hgb A1c MFr Bld: 5.1 % (ref 4.6–6.5)

## 2018-05-10 MED ORDER — MONTELUKAST SODIUM 10 MG PO TABS: 10.0000 mg | ORAL_TABLET | Freq: Every day | ORAL | 1 refills | Status: DC

## 2018-05-10 MED ORDER — BUPROPION HCL ER (XL) 150 MG PO TB24: 150.0000 mg | ORAL_TABLET | Freq: Every day | ORAL | 5 refills | Status: DC

## 2018-05-10 MED ORDER — OLMESARTAN MEDOXOMIL 5 MG PO TABS: 5.0000 mg | ORAL_TABLET | Freq: Every day | ORAL | 1 refills | Status: DC

## 2018-05-10 MED ORDER — HYDROCHLOROTHIAZIDE 25 MG PO TABS: 25.0000 mg | ORAL_TABLET | Freq: Every day | ORAL | 1 refills | Status: DC

## 2018-05-11 ENCOUNTER — Encounter: Payer: Self-pay | Admitting: Internal Medicine

## 2018-05-11 NOTE — Assessment & Plan Note (Signed)
Mild, intermittent Controlled Singulair daily Symbicort, albuterol as needed

## 2018-05-11 NOTE — Assessment & Plan Note (Signed)
Check a1c Low sugar / carb diet Stressed regular exercise, weight loss  

## 2018-05-11 NOTE — Assessment & Plan Note (Signed)
Has been out of her medication for a while - restarted yesterday - stressed that she can not go without her medication Discussed side effects of uncontrolled htn cmp

## 2018-05-11 NOTE — Assessment & Plan Note (Signed)
New She is living with her sister, barely makes any money and is very stress and depressed Trial of well butrin

## 2018-07-09 ENCOUNTER — Ambulatory Visit: Payer: BLUE CROSS/BLUE SHIELD | Admitting: Internal Medicine

## 2018-07-09 VITALS — BP 128/84 | HR 79 | Temp 98.8°F | Ht 64.0 in | Wt 281.0 lb

## 2018-07-09 DIAGNOSIS — R6889 Other general symptoms and signs: Secondary | ICD-10-CM | POA: Diagnosis not present

## 2018-07-09 DIAGNOSIS — R7303 Prediabetes: Secondary | ICD-10-CM | POA: Diagnosis not present

## 2018-07-09 DIAGNOSIS — J4520 Mild intermittent asthma, uncomplicated: Secondary | ICD-10-CM

## 2018-07-09 DIAGNOSIS — J101 Influenza due to other identified influenza virus with other respiratory manifestations: Secondary | ICD-10-CM

## 2018-07-09 DIAGNOSIS — I10 Essential (primary) hypertension: Secondary | ICD-10-CM

## 2018-07-09 LAB — POCT INFLUENZA A/B
Influenza A, POC: NEGATIVE
Influenza B, POC: POSITIVE — AB

## 2018-07-09 MED ORDER — HYDROCODONE-HOMATROPINE 5-1.5 MG/5ML PO SYRP: 5.0000 mL | ORAL_SOLUTION | Freq: Four times a day (QID) | ORAL | 0 refills | Status: AC | PRN

## 2018-07-09 MED ORDER — OSELTAMIVIR PHOSPHATE 75 MG PO CAPS: 75.0000 mg | ORAL_CAPSULE | Freq: Two times a day (BID) | ORAL | 0 refills | Status: AC

## 2018-07-09 NOTE — Assessment & Plan Note (Signed)
stable overall by history and exam, recent data reviewed with pt, and pt to continue medical treatment as before,  to f/u any worsening symptoms or concerns  

## 2018-07-09 NOTE — Patient Instructions (Signed)
Please take all new medication as prescribed - the tamiflu (virus antibiotic), and cough medicine as needed  You are given the work note  Please continue all other medications as before, and refills have been done if requested.  Please have the pharmacy call with any other refills you may need.  Please keep your appointments with your specialists as you may have planned

## 2018-07-09 NOTE — Assessment & Plan Note (Signed)
Mild to mod, for valtrex course, cough med prn, work note given,  to f/u any worsening symptoms or concerns

## 2018-07-09 NOTE — Progress Notes (Signed)
Subjective:    Patient ID: Marissa Joseph, female    DOB: 07/06/1980, 38 y.o.   MRN: [id number]  HPI  Here with 1-2 days onset flu like symptoms with fever, myalgias, cough, congestion with clear color sputum, fatigue and malaise. Pt denies chest pain, increased sob or doe, wheezing, orthopnea, PND, increased LE swelling, palpitations, dizziness or syncope.  Pt denies new neurological symptoms such as new headache, or facial or extremity weakness or numbness   Pt denies polydipsia, polyuria.  Denies worsening reflux, abd pain, dysphagia, n/v, bowel change or blood. Past Medical History:  Diagnosis Date  . Anxiety    no official dx  . Asthma   . Eczema   . Fibroids   . GONORRHEA 03/25/2008   Qualifier: Diagnosis of  By: Isla Pence    . Heart murmur   . Heart murmur   . Hypertension   . MRSA (methicillin resistant staph aureus) culture positive 05/2012   cellulits/ left leg  . Obesity    Past Surgical History:  Procedure Laterality Date  . NO PAST SURGERIES      reports that she has been smoking cigarettes. She has a 1.00 pack-year smoking history. She has never used smokeless tobacco. She reports current alcohol use. She reports current drug use. Drug: Marijuana. family history includes Cancer in her maternal uncle and paternal aunt; Diabetes in her father, maternal grandmother, and mother; Heart disease in her maternal grandmother; Stroke in her maternal grandfather. Allergies  Allergen Reactions  . Avocado Other (See Comments)    Throat itching and swelling  . Shellfish Allergy Hives, Shortness Of Breath and Swelling   Current Outpatient Medications on File Prior to Visit  Medication Sig Dispense Refill  . albuterol (PROVENTIL HFA;VENTOLIN HFA) 108 (90 Base) MCG/ACT inhaler Inhale 2 puffs into the lungs every 6 (six) hours as needed for wheezing. 1 Inhaler 8  . budesonide-formoterol (SYMBICORT) 80-4.5 MCG/ACT inhaler Inhale 2 puffs into the lungs 2 (two) times  daily. 1 Inhaler 3  . buPROPion (WELLBUTRIN XL) 150 MG 24 hr tablet Take 1 tablet (150 mg total) by mouth daily. 30 tablet 5  . hydrochlorothiazide (HYDRODIURIL) 25 MG tablet Take 1 tablet (25 mg total) by mouth daily. 90 tablet 1  . methocarbamol (ROBAXIN) 500 MG tablet Take 1 tablet (500 mg total) by mouth 2 (two) times daily. 20 tablet 0  . montelukast (SINGULAIR) 10 MG tablet Take 1 tablet (10 mg total) by mouth at bedtime. 90 tablet 1  . olmesartan (BENICAR) 5 MG tablet Take 1 tablet (5 mg total) by mouth daily. 90 tablet 1  . triamcinolone cream (KENALOG) 0.1 % Apply 1 application topically 2 (two) times daily. 30 g 5   No current facility-administered medications on file prior to visit.    Review of Systems  Constitutional: Negative for other unusual diaphoresis or sweats HENT: Negative for ear discharge or swelling Eyes: Negative for other worsening visual disturbances Respiratory: Negative for stridor or other swelling  Gastrointestinal: Negative for worsening distension or other blood Genitourinary: Negative for retention or other urinary change Musculoskeletal: Negative for other MSK pain or swelling Skin: Negative for color change or other new lesions Neurological: Negative for worsening tremors and other numbness  Psychiatric/Behavioral: Negative for worsening agitation or other fatigue All other system neg per pt    Objective:   Physical Exam BP 128/84   Pulse 79   Temp 98.8 F (37.1 C) (Oral)   Ht 5\' 4"  (1.626 m)  Wt 281 lb (127.5 kg)   SpO2 98%   BMI 48.23 kg/m  VS noted, mild ill Constitutional: Pt appears in NAD HENT: Head: NCAT.  Right Ear: External ear normal.  Left Ear: External ear normal.  Bilat tm's with mild erythema.  Max sinus areas non tender.  Pharynx with mild erythema, no exudate Eyes: . Pupils are equal, round, and reactive to light. Conjunctivae and EOM are normal Nose: without d/c or deformity Neck: Neck supple. Gross normal  ROM Cardiovascular: Normal rate and regular rhythm.   Pulmonary/Chest: Effort normal and breath sounds without rales or wheezing.  Neurological: Pt is alert. At baseline orientation, motor grossly intact Skin: Skin is warm. No rashes, other new lesions, no LE edema Psychiatric: Pt behavior is normal without agitation  No other exam findings  POCT Influenza A/B  Order: [id number]  Status:  Final result Visible to patient:  No (Not Released) Dx:  Flu-like symptoms   Ref Range & Units 16:14  Influenza A, POC Negative Negative   Influenza B, POC Negative PositiveAbnormal             Assessment & Plan:

## 2018-08-07 ENCOUNTER — Encounter: Payer: Self-pay | Admitting: Obstetrics & Gynecology

## 2018-08-07 ENCOUNTER — Ambulatory Visit (INDEPENDENT_AMBULATORY_CARE_PROVIDER_SITE_OTHER): Payer: BLUE CROSS/BLUE SHIELD | Admitting: Obstetrics & Gynecology

## 2018-08-07 VITALS — BP 156/97 | HR 56 | Wt 283.2 lb

## 2018-08-07 DIAGNOSIS — Z113 Encounter for screening for infections with a predominantly sexual mode of transmission: Secondary | ICD-10-CM | POA: Diagnosis not present

## 2018-08-07 DIAGNOSIS — Z01419 Encounter for gynecological examination (general) (routine) without abnormal findings: Secondary | ICD-10-CM | POA: Diagnosis not present

## 2018-08-07 DIAGNOSIS — D219 Benign neoplasm of connective and other soft tissue, unspecified: Secondary | ICD-10-CM

## 2018-08-07 DIAGNOSIS — Z124 Encounter for screening for malignant neoplasm of cervix: Secondary | ICD-10-CM | POA: Diagnosis not present

## 2018-08-07 DIAGNOSIS — Z1151 Encounter for screening for human papillomavirus (HPV): Secondary | ICD-10-CM | POA: Diagnosis not present

## 2018-08-07 NOTE — Progress Notes (Signed)
Subjective:    Marissa Joseph is a 38 y.o. single G0 who presents for an annual exam. The patient has no complaints today. The patient is sexually active with a partner of 2.5 years. She is not using contraception.  She reports that she uses condoms occasionally, but is interested in the near future.  GYN screening history: last pap: was normal. The patient wears seatbelts: yes. The patient participates in regular exercise: yes. Has the patient ever been transfused or tattooed?: tattoo on left wrist. The patient reports that there is not domestic violence in her life.   Menstrual History: OB History    Gravida  0   Para      Term      Preterm      AB      Living        SAB      TAB      Ectopic      Multiple      Live Births              Menarche age: 38 yo Patient's last menstrual period was 07/26/2018 (exact date).    The following portions of the patient's history were reviewed and updated as appropriate: allergies, current medications, past family history, past medical history, past social history, past surgical history and problem list.  Review of Systems PH- no personal history of breast, cervical, ovarian or colon cancer  FH- Breast cancer (paternal aunt), no colon, ovarian or cervical cancer Contraception: none, intermittent condom use Monogamous for  2/12 years Works in Education administrator  Objective:    BP (!) 156/97   Pulse (!) 56   Wt 283 lb 3.2 oz (128.5 kg)   LMP 07/26/2018 (Exact Date)   BMI 48.61 kg/m   General Appearance:    Alert, cooperative, no distress, appears stated age  Head:    Normocephalic, without obvious abnormality, atraumatic  Eyes:    PERRL, conjunctiva/corneas clear, EOM's intact, fundi    benign, both eyes  Ears:    Normal TM's and external ear canals, both ears  Nose:   Nares normal, septum midline, mucosa normal, no drainage    or sinus tenderness  Throat:   Lips, mucosa, and tongue normal; teeth and gums normal  Neck:    Supple, symmetrical, trachea midline, no adenopathy;    thyroid:  no enlargement/tenderness/nodules; no carotid   bruit or JVD  Back:     Symmetric, no curvature, ROM normal, no CVA tenderness  Lungs:     Clear to auscultation bilaterally, respirations unlabored  Chest Wall:    No tenderness or deformity   Heart:    Regular rate and rhythm, S1 and S2 normal, no murmur, rub   or gallop  Breast Exam:    No tenderness, masses, or nipple abnormality  Abdomen:     Soft, non-tender, bowel sounds active all four quadrants,    no masses, no organomegaly  Genitalia:    Normal female without lesion, discharge or tenderness, 10 week size uterus (she tells me that she has fibroids)     Extremities:   Extremities normal, atraumatic, no cyanosis or edema  Pulses:   2+ and symmetric all extremities  Skin:   Skin color, texture, turgor normal, no rashes or lesions  Lymph nodes:   Cervical, supraclavicular, and axillary nodes normal  Neurologic:   CNII-XII intact, normal strength, sensation and reflexes    throughout  .    Assessment:    Healthy female  exam.   H/o fibroids   Plan:     Thin prep Pap smear. with cotesting STI testing requested Rec MVI daily to help prevent ONTDs Rec weight loss Gyn u/s

## 2018-08-07 NOTE — Progress Notes (Signed)
Ultrasound scheduled for Tuesday 3/10 @ 1500.

## 2018-08-08 LAB — HEPATITIS B SURFACE ANTIGEN: Hepatitis B Surface Ag: NEGATIVE

## 2018-08-08 LAB — RPR: RPR Ser Ql: NONREACTIVE

## 2018-08-08 LAB — HIV ANTIBODY (ROUTINE TESTING W REFLEX): HIV Screen 4th Generation wRfx: NONREACTIVE

## 2018-08-08 LAB — HEPATITIS C ANTIBODY: Hep C Virus Ab: <0.1 s/co ratio (ref 0.0–0.9)

## 2018-08-09 LAB — CYTOLOGY - PAP
Chlamydia: NEGATIVE
Diagnosis: NEGATIVE
HPV: NOT DETECTED
Neisseria Gonorrhea: NEGATIVE

## 2018-08-13 ENCOUNTER — Ambulatory Visit (HOSPITAL_COMMUNITY): Payer: BLUE CROSS/BLUE SHIELD

## 2018-08-14 ENCOUNTER — Telehealth: Payer: Self-pay

## 2018-08-14 MED ORDER — METRONIDAZOLE 500 MG PO TABS: 2000.0000 mg | ORAL_TABLET | Freq: Once | ORAL | 0 refills | Status: AC

## 2018-08-14 NOTE — Telephone Encounter (Signed)
Per protocol e-prescribed Flaygl 2g po once for tx of Trich.  Pt notified via [facility].

## 2018-09-03 ENCOUNTER — Encounter: Payer: Self-pay | Admitting: Internal Medicine

## 2018-09-04 ENCOUNTER — Other Ambulatory Visit: Payer: Self-pay

## 2018-09-04 MED ORDER — BUPROPION HCL ER (XL) 150 MG PO TB24: 150.0000 mg | ORAL_TABLET | Freq: Every day | ORAL | 1 refills | Status: DC

## 2018-09-04 MED ORDER — MONTELUKAST SODIUM 10 MG PO TABS: 10.0000 mg | ORAL_TABLET | Freq: Every day | ORAL | 1 refills | Status: DC

## 2018-09-04 MED ORDER — HYDROCHLOROTHIAZIDE 25 MG PO TABS: 25.0000 mg | ORAL_TABLET | Freq: Every day | ORAL | 1 refills | Status: DC

## 2018-09-04 MED ORDER — OLMESARTAN MEDOXOMIL 5 MG PO TABS: 5.0000 mg | ORAL_TABLET | Freq: Every day | ORAL | 1 refills | Status: DC

## 2018-09-25 ENCOUNTER — Encounter: Payer: Self-pay | Admitting: Internal Medicine

## 2018-09-25 ENCOUNTER — Ambulatory Visit (INDEPENDENT_AMBULATORY_CARE_PROVIDER_SITE_OTHER): Payer: BLUE CROSS/BLUE SHIELD | Admitting: Internal Medicine

## 2018-09-25 DIAGNOSIS — M549 Dorsalgia, unspecified: Secondary | ICD-10-CM | POA: Insufficient documentation

## 2018-09-25 DIAGNOSIS — M546 Pain in thoracic spine: Secondary | ICD-10-CM

## 2018-09-25 DIAGNOSIS — G8929 Other chronic pain: Secondary | ICD-10-CM | POA: Insufficient documentation

## 2018-09-25 MED ORDER — PREDNISONE 20 MG PO TABS: 40.0000 mg | ORAL_TABLET | Freq: Every day | ORAL | 0 refills | Status: DC

## 2018-09-25 MED ORDER — MELOXICAM 15 MG PO TABS: 15.0000 mg | ORAL_TABLET | Freq: Every day | ORAL | 0 refills | Status: DC

## 2018-09-25 MED ORDER — ALBUTEROL SULFATE HFA 108 (90 BASE) MCG/ACT IN AERS: 2.0000 | INHALATION_SPRAY | Freq: Four times a day (QID) | RESPIRATORY_TRACT | 8 refills | Status: DC | PRN

## 2018-09-25 MED ORDER — METHOCARBAMOL 500 MG PO TABS: 500.0000 mg | ORAL_TABLET | Freq: Three times a day (TID) | ORAL | 1 refills | Status: DC | PRN

## 2018-09-25 NOTE — Progress Notes (Signed)
Virtual Visit via Video Note  I connected with Marissa Joseph on 09/25/18 at  2:00 PM EDT by a video enabled telemedicine application and verified that I am speaking with the correct person using two identifiers.   I discussed the limitations of evaluation and management by telemedicine and the availability of in person appointments. The patient expressed understanding and agreed to proceed.  The patient is currently at home and I am in the office.    No referring provider.    History of Present Illness: This is an acute visit for back pain.  Back pain: She started having back pain 2 weeks ago.  She does not recall any specific injury or unusual activities.  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  Working does make the pain worse.  The pain is also worse when she gets up in the morning.  She states the pain is in the right middle back and her kidney region and near her spine.  2 days ago her right shoulder area and spine were hurting more.  She thinks it is a little bit better during the day, but still painful.  She has been taking ibuprofen and it has helped.  She has not had any numbness, tingling or weakness.  She states she does feel it is muscular, but is not sure how to make it get better because she is working every day and she is not sure but feels like sleeping makes it worse.    Review of Systems  Constitutional: Negative for chills and fever.  Respiratory: Positive for cough (mild, dry - chronic) and shortness of breath. Negative for wheezing.   Genitourinary: Negative for dysuria, frequency and hematuria.  Neurological: Negative for tingling and weakness.     Social History   Socioeconomic History  . Marital status: Single    Spouse name: Not on file  . Number of children: Not on file  . Years of education: Not on file  . Highest education level: Not on file   Occupational History  . Not on file  Social Needs  . Financial resource strain: Not on file  . Food insecurity:    Worry: Not on file    Inability: Not on file  . Transportation needs:    Medical: Not on file    Non-medical: Not on file  Tobacco Use  . Smoking status: Current Every Day Smoker    Packs/day: 0.20    Years: 5.00    Pack years: 1.00    Types: Cigarettes  . Smokeless tobacco: Never Used  Substance and Sexual Activity  . Alcohol use: Yes    Alcohol/week: 0.0 standard drinks    Comment: occ  . Drug use: Yes    Types: Marijuana    Comment: pot-about 1 joint BID  . Sexual activity: Yes    Birth control/protection: Condom  Lifestyle  . Physical activity:    Days per week: Not on file    Minutes per session: Not on file  . Stress: Not on file  Relationships  . Social connections:    Talks on phone: Not on file    Gets together: Not on file    Attends religious service: Not on file    Active member of club or organization: Not on file    Attends meetings of clubs or organizations: Not on file    Relationship status:  Not on file  Other Topics Concern  . Not on file  Social History Narrative  . Not on file     Observations/Objective: Appears well in NAD   Assessment and Plan:  See Problem List for Assessment and Plan of chronic medical problems.   Follow Up Instructions:    I discussed the assessment and treatment plan with the patient. The patient was provided an opportunity to ask questions and all were answered. The patient agreed with the plan and demonstrated an understanding of the instructions.   The patient was advised to call back or seek an in-person evaluation if the symptoms worsen or if the condition fails to improve as anticipated.    Binnie Rail, MD

## 2018-09-25 NOTE — Assessment & Plan Note (Signed)
Her pain sounds muscular in nature Discussed options-I did give her a note for work so that she can leave early if her pain is too much so she can get a little bit of a break.  We can temporarily take her out of work if needed, but she does prefer to work Methocarbamol 3 times daily as needed-she has taken this in the past and tolerated it well Prednisone 40 mg daily for 5 days.  After she completes the prednisone she will start meloxicam 15 mg daily Discussed that she cannot take any ibuprofen or Aleve while taking the meloxicam Heat, stretching Call if no improvement

## 2018-10-03 ENCOUNTER — Encounter: Payer: Self-pay | Admitting: Internal Medicine

## 2018-10-03 DIAGNOSIS — M549 Dorsalgia, unspecified: Secondary | ICD-10-CM

## 2018-10-04 NOTE — Telephone Encounter (Signed)
Referral ordered.   Gareth Eagle can you schedule with Tamala Julian or schmitz

## 2018-10-07 ENCOUNTER — Other Ambulatory Visit: Payer: Self-pay

## 2018-10-07 ENCOUNTER — Encounter: Payer: Self-pay | Admitting: Family Medicine

## 2018-10-07 ENCOUNTER — Ambulatory Visit (INDEPENDENT_AMBULATORY_CARE_PROVIDER_SITE_OTHER): Payer: BLUE CROSS/BLUE SHIELD | Admitting: Family Medicine

## 2018-10-07 DIAGNOSIS — G2589 Other specified extrapyramidal and movement disorders: Secondary | ICD-10-CM | POA: Diagnosis not present

## 2018-10-07 MED ORDER — DICLOFENAC SODIUM 2 % TD SOLN: 2.0000 g | Freq: Two times a day (BID) | TRANSDERMAL | 3 refills | Status: DC

## 2018-10-07 MED ORDER — VITAMIN D (ERGOCALCIFEROL) 1.25 MG (50000 UNIT) PO CAPS: 50000.0000 [IU] | ORAL_CAPSULE | ORAL | 0 refills | Status: DC

## 2018-10-07 MED ORDER — GABAPENTIN 100 MG PO CAPS: 200.0000 mg | ORAL_CAPSULE | Freq: Every day | ORAL | 3 refills | Status: DC

## 2018-10-07 NOTE — Patient Instructions (Signed)
Good to see you  Exercises 3 times a week.   Ice 20 minutes 2 times daily. Usually after activity and before bed. pennsaid pinkie amount topically 2 times daily as needed.  Gabapentin 200mg  at night to help with sleep and pain  Once weekly vitamin D for 12weeks  See me again in 5-6 weeks

## 2018-10-07 NOTE — Progress Notes (Signed)
[facility] [address] [place], [place] [address] Phone: [phone] Subjective:    I'm seeing this patient by the request  of:  Binnie Rail, MD   CC: Back pain   [id number]  Marissa Joseph is a 38 y.o. female coming in with complaint of thoracic spine pain. Patient has been having pain in thoracic spine pain. Pain got bad enough she felt she was having trouble breating. Was on prednisone for a week about 3 weeks ago. Also using meloxicam. Does have this same feeling when she has an asthma attack. Pain occurs in morning when she gets up. Feels that stress and the way she sleeps is causing her pain. 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       Past Medical History:  Diagnosis Date  . Anxiety    no official dx  . Asthma   . Eczema   . Fibroids   . GONORRHEA 03/25/2008   Qualifier: Diagnosis of  By: Isla Pence    . Heart murmur   . Heart murmur   . Hypertension   . MRSA (methicillin resistant staph aureus) culture positive 05/2012   cellulits/ left leg  . Obesity    Past Surgical History:  Procedure Laterality Date  . NO PAST SURGERIES     Social History   Socioeconomic History  . Marital status: Single    Spouse name: Not on file  . Number of children: Not on file  . Years of education: Not on file  . Highest education level: Not on file  Occupational History  . Not on file  Social Needs  . Financial resource strain: Not on file  . Food insecurity:    Worry: Not on file    Inability: Not on file  . Transportation needs:    Medical: Not on file    Non-medical: Not on file  Tobacco Use  . Smoking status: Current Every Day Smoker    Packs/day: 0.20    Years: 5.00    Pack years: 1.00    Types: Cigarettes  . Smokeless tobacco: Never Used  Substance and Sexual Activity  . Alcohol use: Yes    Alcohol/week: 0.0 standard drinks    Comment: occ  . Drug use: Yes    Types: Marijuana    Comment:  pot-about 1 joint BID  . Sexual activity: Yes    Birth control/protection: Condom  Lifestyle  . Physical activity:    Days per week: Not on file    Minutes per session: Not on file  . Stress: Not on file  Relationships  . Social connections:    Talks on phone: Not on file    Gets together: Not on file    Attends religious service: Not on file    Active member of club or organization: Not on file    Attends meetings of clubs or organizations: Not on file    Relationship status: Not on file  Other Topics Concern  . Not on file  Social History Narrative  . Not on file   Allergies  Allergen Reactions  . Avocado Other (See Comments)    Throat itching and swelling  . Shellfish Allergy Hives, Shortness Of Breath and Swelling   Family History  Problem Relation Age of Onset  . Heart disease Maternal Grandmother   . Diabetes Maternal Grandmother   . Diabetes Father   . Diabetes Mother   . Cancer Maternal Uncle  prostate  . Cancer Paternal Aunt        lung  . Stroke Maternal Grandfather     Current Outpatient Medications (Endocrine & Metabolic):  .  predniSONE (DELTASONE) 20 MG tablet, Take 2 tablets (40 mg total) by mouth daily with breakfast.  Current Outpatient Medications (Cardiovascular):  .  hydrochlorothiazide (HYDRODIURIL) 25 MG tablet, Take 1 tablet (25 mg total) by mouth daily. Marland Kitchen  olmesartan (BENICAR) 5 MG tablet, Take 1 tablet (5 mg total) by mouth daily.  Current Outpatient Medications (Respiratory):  .  albuterol (VENTOLIN HFA) 108 (90 Base) MCG/ACT inhaler, Inhale 2 puffs into the lungs every 6 (six) hours as needed for wheezing. .  budesonide-formoterol (SYMBICORT) 80-4.5 MCG/ACT inhaler, Inhale 2 puffs into the lungs 2 (two) times daily. .  montelukast (SINGULAIR) 10 MG tablet, Take 1 tablet (10 mg total) by mouth at bedtime.  Current Outpatient Medications (Analgesics):  .  meloxicam (MOBIC) 15 MG tablet, Take 1 tablet (15 mg total) by mouth daily. Do  not take any ibuprofen while taking this medication.  Start after completing prednisone.   Current Outpatient Medications (Other):  Marland Kitchen  buPROPion (WELLBUTRIN XL) 150 MG 24 hr tablet, Take 1 tablet (150 mg total) by mouth daily. .  methocarbamol (ROBAXIN) 500 MG tablet, Take 1 tablet (500 mg total) by mouth every 8 (eight) hours as needed for muscle spasms. .  Diclofenac Sodium 2 % SOLN, Place 2 g onto the skin 2 (two) times daily. Marland Kitchen  gabapentin (NEURONTIN) 100 MG capsule, Take 2 capsules (200 mg total) by mouth at bedtime. .  Vitamin D, Ergocalciferol, (DRISDOL) 1.25 MG (50000 UT) CAPS capsule, Take 1 capsule (50,000 Units total) by mouth every 7 (seven) days.    Past medical history, social, surgical and family history all reviewed in electronic medical record.  No pertanent information unless stated regarding to the chief complaint.   Review of Systems:  No headache, visual changes, nausea, vomiting, diarrhea, constipation, dizziness, abdominal pain, skin rash, fevers, chills, night sweats, weight loss, swollen lymph nodes, body aches, joint swelling, \ chest pain, shortness of breath, mood changes.  Positive muscle aches  Objective  Blood pressure 132/84, pulse 87, height 5\' 4"  (1.626 m), weight 280 lb (127 kg), SpO2 98 %.    General: No apparent distress alert and oriented x3 mood and affect normal, dressed appropriately.  HEENT: Pupils equal, extraocular movements intact  Respiratory: Patient's speak in full sentences and does not appear short of breath  Cardiovascular: No lower extremity edema, non tender, no erythema  Skin: Warm dry intact with no signs of infection or rash on extremities or on axial skeleton.  Abdomen: Soft nontender significant pannus secondary to patient's morbid obesity Neuro: Cranial nerves II through XII are intact, neurovascularly intact in all extremities with 2+ DTRs and 2+ pulses.  Lymph: No lymphadenopathy of posterior or anterior cervical chain or  axillae bilaterally.  Gait mild antalgic MSK:  Non tender with full range of motion and good stability and symmetric strength and tone of shoulders, elbows, wrist, hip, knee and ankles bilaterally.  Patient found to have bilateral scapular dyskinesis.  Does have tightness bilaterally.  Patient does have some difficulty with the inferior scapula and not moving appropriately.  Patient shoulder some very mild impingement. Mild loss of lordosis present patient does have near full range of motion with negative Spurling's.   Impression and Recommendations:     This case required medical decision making of moderate complexity. The above documentation has  been reviewed and is accurate and complete Lyndal Pulley, DO       Note: This dictation was prepared with Dragon dictation along with smaller phrase technology. Any transcriptional errors that result from this process are unintentional.

## 2018-10-07 NOTE — Assessment & Plan Note (Signed)
Scapular dyskinesis.  Discussed icing regimen and home exercise.  Discussed which activities of doing which wants to avoid.  Patient is to increase activity slowly over the course of next several weeks.  We discussed that patient's body habitus and breast tissue is also likely contributing.  Patient given posture and ergonomics.  Patient work with Product/process development scientist.  Gabapentin to help with nighttime pain.  Follow-up again 4 to 6 weeks

## 2018-10-18 ENCOUNTER — Encounter: Payer: Self-pay | Admitting: Internal Medicine

## 2018-11-04 ENCOUNTER — Ambulatory Visit: Payer: BLUE CROSS/BLUE SHIELD | Admitting: Family Medicine

## 2018-11-12 NOTE — Progress Notes (Signed)
Virtual Visit via Video Note  I connected with Marissa Joseph on 11/13/18 at  3:00 PM EDT by a video enabled telemedicine application and verified that I am speaking with the correct person using two identifiers.   I discussed the limitations of evaluation and management by telemedicine and the availability of in person appointments. The patient expressed understanding and agreed to proceed.  The patient is currently at home and I am in the office.    No referring provider.    History of Present Illness: She is here for follow up of her chronic medical conditions.   She will be starting a new job.    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     Hypertension: She is taking her medication daily. She is compliant with a low sodium diet.  She denies chest pain, palpitations, edema, shortness of breath and regular headaches. She does not monitor her blood pressure at home.    Prediabetes:  She is compliant with a low sugar/carbohydrate diet.  She is not exercising regularly.  Asthma:  Her asthma is mild, intermittent.  She uses Symbicort twice daily as needed.  She takes albuterol prn.  She takes singulair daily.  Her asthma is controlled.    Depression: She is taking her medication daily as prescribed. She denies any side effects from the medication. She feels her depression is well controlled and she is happy with her current dose of medication.   Body pain, thoracic back pain:  I saw her recently for mid back pain.  The pain has resolved.  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  She finds the meloxicam helps more than the advil she was taking prior to getting the meloxicam.  She would like to continue it as needed.     Review of Systems  Constitutional: Negative for chills and fever.  Respiratory: Positive for shortness of breath (with humidity) and wheezing (rare). Negative for cough.   Cardiovascular: Positive for leg  swelling (when she forgets to take her medication - usualy on the weekends). Negative for chest pain.  Neurological: Positive for headaches (occ).     Social History   Socioeconomic History  . Marital status: Single    Spouse name: Not on file  . Number of children: Not on file  . Years of education: Not on file  . Highest education level: Not on file  Occupational History  . Not on file  Social Needs  . Financial resource strain: Not on file  . Food insecurity:    Worry: Not on file    Inability: Not on file  . Transportation needs:    Medical: Not on file    Non-medical: Not on file  Tobacco Use  . Smoking status: Current Every Day Smoker    Packs/day: 0.20    Years: 5.00    Pack years: 1.00    Types: Cigarettes  . Smokeless tobacco: Never Used  Substance and Sexual Activity  . Alcohol use: Yes    Alcohol/week: 0.0 standard drinks    Comment: occ  . Drug use: Yes    Types: Marijuana    Comment: pot-about 1 joint BID  . Sexual activity: Yes    Birth control/protection: Condom  Lifestyle  . Physical activity:    Days per week: Not on file    Minutes per session: Not on file  . Stress: Not on file  Relationships  .  Social connections:    Talks on phone: Not on file    Gets together: Not on file    Attends religious service: Not on file    Active member of club or organization: Not on file    Attends meetings of clubs or organizations: Not on file    Relationship status: Not on file  Other Topics Concern  . Not on file  Social History Narrative  . Not on file     Observations/Objective: Appears well in NAD   Assessment and Plan:  See Problem List for Assessment and Plan of chronic medical problems.   Follow Up Instructions:    I discussed the assessment and treatment plan with the patient. The patient was provided an opportunity to ask questions and all were answered. The patient agreed with the plan and demonstrated an understanding of the  instructions.   The patient was advised to call back or seek an in-person evaluation if the symptoms worsen or if the condition fails to improve as anticipated.  FU in 6 months - will hold off on blood work until then  Binnie Rail, MD

## 2018-11-13 ENCOUNTER — Other Ambulatory Visit: Payer: Self-pay

## 2018-11-13 ENCOUNTER — Encounter: Payer: Self-pay | Admitting: Internal Medicine

## 2018-11-13 ENCOUNTER — Ambulatory Visit (INDEPENDENT_AMBULATORY_CARE_PROVIDER_SITE_OTHER): Payer: BLUE CROSS/BLUE SHIELD | Admitting: Internal Medicine

## 2018-11-13 DIAGNOSIS — R7303 Prediabetes: Secondary | ICD-10-CM

## 2018-11-13 DIAGNOSIS — F3289 Other specified depressive episodes: Secondary | ICD-10-CM

## 2018-11-13 DIAGNOSIS — I10 Essential (primary) hypertension: Secondary | ICD-10-CM

## 2018-11-13 DIAGNOSIS — M546 Pain in thoracic spine: Secondary | ICD-10-CM

## 2018-11-13 DIAGNOSIS — J4520 Mild intermittent asthma, uncomplicated: Secondary | ICD-10-CM

## 2018-11-13 DIAGNOSIS — M791 Myalgia, unspecified site: Secondary | ICD-10-CM | POA: Insufficient documentation

## 2018-11-13 MED ORDER — MELOXICAM 15 MG PO TABS: 15.0000 mg | ORAL_TABLET | Freq: Every day | ORAL | 0 refills | Status: DC | PRN

## 2018-11-13 MED ORDER — HYDROCHLOROTHIAZIDE 25 MG PO TABS: 25.0000 mg | ORAL_TABLET | Freq: Every day | ORAL | 1 refills | Status: DC

## 2018-11-13 MED ORDER — OLMESARTAN MEDOXOMIL 5 MG PO TABS: 5.0000 mg | ORAL_TABLET | Freq: Every day | ORAL | 1 refills | Status: DC

## 2018-11-13 NOTE — Assessment & Plan Note (Signed)
BP Readings from Last 3 Encounters:  10/07/18 132/84  08/07/18 (!) 156/97  07/09/18 128/84    BP well controlled Current regimen effective and well tolerated Continue current medications at current doses

## 2018-11-13 NOTE — Assessment & Plan Note (Signed)
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

## 2018-11-13 NOTE — Assessment & Plan Note (Signed)
Mild, intermittent Taking symbicort prn and albuterol on occasion continue

## 2018-11-13 NOTE — Assessment & Plan Note (Signed)
Resolved after steroids and methocarbamol and meloxicam

## 2018-11-13 NOTE — Assessment & Plan Note (Signed)
Low sugar / carb diet Stressed regular exercise    

## 2018-11-13 NOTE — Assessment & Plan Note (Signed)
Controlled, stable Continue current dose of medication  

## 2018-11-24 ENCOUNTER — Encounter: Payer: Self-pay | Admitting: Family Medicine

## 2018-11-28 ENCOUNTER — Ambulatory Visit: Payer: BLUE CROSS/BLUE SHIELD | Admitting: Family Medicine

## 2019-01-01 ENCOUNTER — Encounter: Payer: Self-pay | Admitting: Internal Medicine

## 2019-03-06 ENCOUNTER — Emergency Department (HOSPITAL_COMMUNITY): Payer: Self-pay

## 2019-03-06 ENCOUNTER — Emergency Department (HOSPITAL_COMMUNITY)
Admission: EM | Admit: 2019-03-06 | Discharge: 2019-03-06 | Disposition: A | Discharge status: Home / Self Care | Payer: Self-pay | Attending: Emergency Medicine | Admitting: Emergency Medicine

## 2019-03-06 ENCOUNTER — Encounter (HOSPITAL_COMMUNITY): Payer: Self-pay | Admitting: Emergency Medicine

## 2019-03-06 ENCOUNTER — Other Ambulatory Visit: Payer: Self-pay

## 2019-03-06 DIAGNOSIS — Z79899 Other long term (current) drug therapy: Secondary | ICD-10-CM | POA: Insufficient documentation

## 2019-03-06 DIAGNOSIS — M25561 Pain in right knee: Secondary | ICD-10-CM

## 2019-03-06 DIAGNOSIS — I10 Essential (primary) hypertension: Secondary | ICD-10-CM | POA: Insufficient documentation

## 2019-03-06 DIAGNOSIS — F1290 Cannabis use, unspecified, uncomplicated: Secondary | ICD-10-CM | POA: Insufficient documentation

## 2019-03-06 DIAGNOSIS — F17210 Nicotine dependence, cigarettes, uncomplicated: Secondary | ICD-10-CM | POA: Insufficient documentation

## 2019-03-06 DIAGNOSIS — M25461 Effusion, right knee: Secondary | ICD-10-CM | POA: Insufficient documentation

## 2019-03-06 MED ORDER — NAPROXEN 500 MG PO TABS: 500.0000 mg | ORAL_TABLET | Freq: Two times a day (BID) | ORAL | 0 refills | Status: DC

## 2019-03-06 MED ORDER — NAPROXEN 500 MG PO TABS
500.0000 mg | ORAL_TABLET | Freq: Once | ORAL | Status: AC
  Administered 2019-03-06: 11:00:00 500 mg via ORAL
  Filled 2019-03-06: qty 1

## 2019-03-06 NOTE — ED Provider Notes (Signed)
[facility] DEPT Provider Note   CSN: [id number] Arrival date & time: 03/06/19  0857     History   Chief Complaint Chief Complaint  Patient presents with  . Knee Pain    HPI Marissa Joseph is a 38 y.o. female.     The history is provided by the patient. No language interpreter was used.  Knee Pain  Marissa Joseph is a 38 y.o. female who presents to the Emergency Department complaining of knee pain. She presents to the emergency department complaining of right knee pain for the last 2 to 2 1/2 weeks. She does not recall an exact trauma but does state that she fell down some stairs several weeks ago. 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 The pain is located in the right knee and at times radiates down her right shin. It is worse with extending the leg fully and with weight bearing. She denies any fevers, chills, nausea, vomiting, numbness, weakness. No prior similar symptoms. She does have a history of cellulitis in the left leg. Symptoms are moderate, constant, worsening. She has been taking ibuprofen for the pain that does give her some relief but she did not take any today. Past Medical History:  Diagnosis Date  . Anxiety    no official dx  . Asthma   . Eczema   . Fibroids   . GONORRHEA 03/25/2008   Qualifier: Diagnosis of  By: Isla Pence    . Heart murmur   . Heart murmur   . Hypertension   . MRSA (methicillin resistant staph aureus) culture positive 05/2012   cellulits/ left leg  . Obesity     Patient Active Problem List   Diagnosis Date Noted  . Muscular pain 11/13/2018  . Scapular dyskinesis 10/07/2018  . Acute right-sided thoracic back pain 09/25/2018  . Depression 05/10/2018  . Amenorrhea 10/10/2017  . Ventral hernia without obstruction or gangrene 07/13/2017  . Hearing difficulty 11/24/2016  . Prediabetes 09/06/2015  . Essential hypertension, benign 09/06/2015  . Smoking  12/22/2013  . Osteoarthritis of left knee 08/29/2013  . Pap smear of cervix with ASCUS, cannot exclude HGSIL 06/25/2013  . Fibroid uterus 05/22/2013  . Dysmenorrhea 05/22/2013  . Monilial intertrigo 01/06/2013  . ALLERGIC RHINITIS 04/10/2008  . HEEL PAIN, LEFT 11/22/2007  . DEPENDENT EDEMA, LEGS, BILATERAL 08/06/2007  . Obstructive sleep apnea 03/25/2007  . Eczema 02/21/2007  . OBESITY NOS 01/21/2007  . Asthma 01/21/2007    Past Surgical History:  Procedure Laterality Date  . NO PAST SURGERIES       OB History    Gravida  0   Para      Term      Preterm      AB      Living        SAB      TAB      Ectopic      Multiple      Live Births               Home Medications    Prior to Admission medications   Medication Sig Start Date End Date Taking? Authorizing Provider  albuterol (VENTOLIN HFA) 108 (90 Base) MCG/ACT inhaler Inhale 2 puffs into the lungs every 6 (six) hours as needed for wheezing. 09/25/18   Binnie Rail, MD  budesonide-formoterol (SYMBICORT) 80-4.5 MCG/ACT inhaler Inhale 2 puffs into the lungs 2 (two)  times daily. 08/14/17   Binnie Rail, MD  buPROPion (WELLBUTRIN XL) 150 MG 24 hr tablet Take 1 tablet (150 mg total) by mouth daily. 09/04/18   Binnie Rail, MD  Diclofenac Sodium 2 % SOLN Place 2 g onto the skin 2 (two) times daily. 10/07/18   Lyndal Pulley, DO  gabapentin (NEURONTIN) 100 MG capsule Take 2 capsules (200 mg total) by mouth at bedtime. 10/07/18   Lyndal Pulley, DO  hydrochlorothiazide (HYDRODIURIL) 25 MG tablet Take 1 tablet (25 mg total) by mouth daily. 11/13/18   Burns, Claudina Lick, MD  montelukast (SINGULAIR) 10 MG tablet Take 1 tablet (10 mg total) by mouth at bedtime. 09/04/18   Binnie Rail, MD  naproxen (NAPROSYN) 500 MG tablet Take 1 tablet (500 mg total) by mouth 2 (two) times daily with a meal. 03/06/19   Quintella Reichert, MD  olmesartan (BENICAR) 5 MG tablet Take 1 tablet (5 mg total) by mouth daily. 11/13/18   Binnie Rail, MD  Vitamin D, Ergocalciferol, (DRISDOL) 1.25 MG (50000 UT) CAPS capsule Take 1 capsule (50,000 Units total) by mouth every 7 (seven) days. 10/07/18   Lyndal Pulley, DO    Family History Family History  Problem Relation Age of Onset  . Heart disease Maternal Grandmother   . Diabetes Maternal Grandmother   . Diabetes Father   . Diabetes Mother   . Cancer Maternal Uncle        prostate  . Cancer Paternal Aunt        lung  . Stroke Maternal Grandfather     Social History Social History   Tobacco Use  . Smoking status: Current Every Day Smoker    Packs/day: 0.20    Years: 5.00    Pack years: 1.00    Types: Cigarettes  . Smokeless tobacco: Never Used  Substance Use Topics  . Alcohol use: Yes    Alcohol/week: 0.0 standard drinks    Comment: occ  . Drug use: Yes    Types: Marijuana    Comment: pot-about 1 joint BID     Allergies   Avocado and Shellfish allergy   Review of Systems Review of Systems  All other systems reviewed and are negative.    Physical Exam Updated Vital Signs BP (!) 156/116   Pulse 76   Temp 98 F (36.7 C)   Resp 18   LMP 03/01/2019   SpO2 100%   Physical Exam Vitals signs and nursing note reviewed.  Constitutional:      Appearance: She is well-developed.  HENT:     Head: Normocephalic and atraumatic.  Cardiovascular:     Rate and Rhythm: Normal rate and regular rhythm.     Heart sounds: No murmur.  Pulmonary:     Effort: Pulmonary effort is normal. No respiratory distress.     Breath sounds: Normal breath sounds.  Abdominal:     Palpations: Abdomen is soft.     Tenderness: There is no abdominal tenderness. There is no guarding or rebound.  Musculoskeletal:     Comments: 2+ DP pulses bilaterally.  There is moderate swelling to the right knee.  Unable to feel effusion secondary to body habitus.  No significant warmth to the knee.  Flexion/extension intact at the knee.  Antalgic gait but able to bear weight.   Skin:     General: Skin is warm and dry.  Neurological:     Mental Status: She is alert and oriented to person, place,  and time.  Psychiatric:        Behavior: Behavior normal.      ED Treatments / Results  Labs (all labs ordered are listed, but only abnormal results are displayed) Labs Reviewed - No data to display  EKG None  Radiology Dg Knee Complete 4 Views Right  Result Date: 03/06/2019 CLINICAL DATA:  Chronic pain EXAM: RIGHT KNEE - COMPLETE 4+ VIEW COMPARISON:  None. FINDINGS: Frontal, lateral, and bilateral oblique views were obtained. There is no fracture or dislocation. There is a sizable joint effusion. There is extensive spurring in all compartments. There is slight narrowing medially and in the patellofemoral joint regions. No erosive changes evident. IMPRESSION: Sizable joint effusion. Osteoarthritic change in all compartments, more notable medially and in the patellofemoral joints than laterally. No fracture or dislocation. Electronically Signed   By: Lowella Grip III M.D.   On: 03/06/2019 09:27    Procedures Procedures (including critical care time)  Medications Ordered in ED Medications  naproxen (NAPROSYN) tablet 500 mg (has no administration in time range)     Initial Impression / Assessment and Plan / ED Course  I have reviewed the triage vital signs and the nursing notes.  Pertinent labs & imaging results that were available during my care of the patient were reviewed by me and considered in my medical decision making (see chart for details).        Patient here for evaluation of right knee pain. She is non-toxic appearing on evaluation. Examination is not consistent with gouty arthritis or septic arthritis. Imaging does demonstrate a knee effusion. Discussed with patient home care for knee pain, knee effusion. Discussed rest, NSAIDs, knee sleeve for compression, outpatient follow-up and return precautions.  Final Clinical Impressions(s) / ED Diagnoses    Final diagnoses:  Acute pain of right knee  Effusion of right knee    ED Discharge Orders         Ordered    naproxen (NAPROSYN) 500 MG tablet  2 times daily with meals     03/06/19 1014           Quintella Reichert, MD 03/06/19 1020

## 2019-03-06 NOTE — ED Triage Notes (Signed)
Pt c/o right knee pains for couple weeks.

## 2019-03-27 ENCOUNTER — Encounter: Payer: Self-pay | Admitting: Internal Medicine

## 2019-03-27 NOTE — Progress Notes (Deleted)
Subjective:    Patient ID: Marissa Joseph, female    DOB: 13-Jan-1981, 38 y.o.   MRN: [id number]  HPI The patient is here for an acute visit.   Foot pain:    Medications and allergies reviewed with patient and updated if appropriate.  Patient Active Problem List   Diagnosis Date Noted  . Muscular pain 11/13/2018  . Scapular dyskinesis 10/07/2018  . Acute right-sided thoracic back pain 09/25/2018  . Depression 05/10/2018  . Amenorrhea 10/10/2017  . Ventral hernia without obstruction or gangrene 07/13/2017  . Hearing difficulty 11/24/2016  . Prediabetes 09/06/2015  . Essential hypertension, benign 09/06/2015  . Smoking 12/22/2013  . Osteoarthritis of left knee 08/29/2013  . Pap smear of cervix with ASCUS, cannot exclude HGSIL 06/25/2013  . Fibroid uterus 05/22/2013  . Dysmenorrhea 05/22/2013  . Monilial intertrigo 01/06/2013  . ALLERGIC RHINITIS 04/10/2008  . HEEL PAIN, LEFT 11/22/2007  . DEPENDENT EDEMA, LEGS, BILATERAL 08/06/2007  . Obstructive sleep apnea 03/25/2007  . Eczema 02/21/2007  . OBESITY NOS 01/21/2007  . Asthma 01/21/2007    Current Outpatient Medications on File Prior to Visit  Medication Sig Dispense Refill  . albuterol (VENTOLIN HFA) 108 (90 Base) MCG/ACT inhaler Inhale 2 puffs into the lungs every 6 (six) hours as needed for wheezing. 1 Inhaler 8  . budesonide-formoterol (SYMBICORT) 80-4.5 MCG/ACT inhaler Inhale 2 puffs into the lungs 2 (two) times daily. 1 Inhaler 3  . buPROPion (WELLBUTRIN XL) 150 MG 24 hr tablet Take 1 tablet (150 mg total) by mouth daily. 90 tablet 1  . Diclofenac Sodium 2 % SOLN Place 2 g onto the skin 2 (two) times daily. 112 g 3  . gabapentin (NEURONTIN) 100 MG capsule Take 2 capsules (200 mg total) by mouth at bedtime. 60 capsule 3  . hydrochlorothiazide (HYDRODIURIL) 25 MG tablet Take 1 tablet (25 mg total) by mouth daily. 90 tablet 1  . montelukast (SINGULAIR) 10 MG tablet Take 1 tablet (10 mg total) by mouth at  bedtime. 90 tablet 1  . naproxen (NAPROSYN) 500 MG tablet Take 1 tablet (500 mg total) by mouth 2 (two) times daily with a meal. 14 tablet 0  . olmesartan (BENICAR) 5 MG tablet Take 1 tablet (5 mg total) by mouth daily. 90 tablet 1  . Vitamin D, Ergocalciferol, (DRISDOL) 1.25 MG (50000 UT) CAPS capsule Take 1 capsule (50,000 Units total) by mouth every 7 (seven) days. 12 capsule 0   No current facility-administered medications on file prior to visit.     Past Medical History:  Diagnosis Date  . Anxiety    no official dx  . Asthma   . Eczema   . Fibroids   . GONORRHEA 03/25/2008   Qualifier: Diagnosis of  By: Isla Pence    . Heart murmur   . Heart murmur   . Hypertension   . MRSA (methicillin resistant staph aureus) culture positive 05/2012   cellulits/ left leg  . Obesity     Past Surgical History:  Procedure Laterality Date  . NO PAST SURGERIES      Social History   Socioeconomic History  . Marital status: Single    Spouse name: Not on file  . Number of children: Not on file  . Years of education: Not on file  . Highest education level: Not on file  Occupational History  . Not on file  Social Needs  . Financial resource strain: Not on file  . Food insecurity    Worry:  Not on file    Inability: Not on file  . Transportation needs    Medical: Not on file    Non-medical: Not on file  Tobacco Use  . Smoking status: Current Every Day Smoker    Packs/day: 0.20    Years: 5.00    Pack years: 1.00    Types: Cigarettes  . Smokeless tobacco: Never Used  Substance and Sexual Activity  . Alcohol use: Yes    Alcohol/week: 0.0 standard drinks    Comment: occ  . Drug use: Yes    Types: Marijuana    Comment: pot-about 1 joint BID  . Sexual activity: Yes    Birth control/protection: Condom  Lifestyle  . Physical activity    Days per week: Not on file    Minutes per session: Not on file  . Stress: Not on file  Relationships  . Social Herbalist on  phone: Not on file    Gets together: Not on file    Attends religious service: Not on file    Active member of club or organization: Not on file    Attends meetings of clubs or organizations: Not on file    Relationship status: Not on file  Other Topics Concern  . Not on file  Social History Narrative  . Not on file    Family History  Problem Relation Age of Onset  . Heart disease Maternal Grandmother   . Diabetes Maternal Grandmother   . Diabetes Father   . Diabetes Mother   . Cancer Maternal Uncle        prostate  . Cancer Paternal Aunt        lung  . Stroke Maternal Grandfather     Review of Systems     Objective:  There were no vitals filed for this visit. BP Readings from Last 3 Encounters:  03/06/19 138/86  10/07/18 132/84  08/07/18 (!) 156/97   Wt Readings from Last 3 Encounters:  10/07/18 280 lb (127 kg)  08/07/18 283 lb 3.2 oz (128.5 kg)  07/09/18 281 lb (127.5 kg)   There is no height or weight on file to calculate BMI.   Physical Exam         Assessment & Plan:    See Problem List for Assessment and Plan of chronic medical problems.

## 2019-03-28 ENCOUNTER — Ambulatory Visit: Payer: Medicaid Other | Admitting: Internal Medicine

## 2019-04-27 ENCOUNTER — Encounter: Payer: Self-pay | Admitting: Internal Medicine

## 2019-04-27 DIAGNOSIS — M25562 Pain in left knee: Secondary | ICD-10-CM

## 2019-04-28 NOTE — Addendum Note (Signed)
Addended by: Binnie Rail on: 04/28/2019 04:07 PM   Modules accepted: Orders

## 2019-05-06 ENCOUNTER — Encounter: Payer: Self-pay | Admitting: Orthopaedic Surgery

## 2019-05-06 ENCOUNTER — Other Ambulatory Visit: Payer: Self-pay

## 2019-05-06 ENCOUNTER — Ambulatory Visit (INDEPENDENT_AMBULATORY_CARE_PROVIDER_SITE_OTHER): Payer: PRIVATE HEALTH INSURANCE | Admitting: Orthopaedic Surgery

## 2019-05-06 DIAGNOSIS — Z6841 Body Mass Index (BMI) 40.0 and over, adult: Secondary | ICD-10-CM | POA: Diagnosis not present

## 2019-05-06 DIAGNOSIS — M1711 Unilateral primary osteoarthritis, right knee: Secondary | ICD-10-CM | POA: Diagnosis not present

## 2019-05-06 MED ORDER — LIDOCAINE HCL 1 % IJ SOLN
2.0000 mL | INTRAMUSCULAR | Status: AC | PRN
  Administered 2019-05-06: 18:00:00 2 mL

## 2019-05-06 MED ORDER — BUPIVACAINE HCL 0.5 % IJ SOLN
2.0000 mL | INTRAMUSCULAR | Status: AC | PRN
  Administered 2019-05-06: 2 mL via INTRA_ARTICULAR

## 2019-05-06 MED ORDER — METHYLPREDNISOLONE ACETATE 40 MG/ML IJ SUSP
40.0000 mg | INTRAMUSCULAR | Status: AC | PRN
  Administered 2019-05-06: 40 mg via INTRA_ARTICULAR

## 2019-05-06 NOTE — Progress Notes (Signed)
Office Visit Note   Patient: Marissa Joseph           Date of Birth: 05-27-1981           MRN: [id number] Visit Date: 05/06/2019              Requested by: Binnie Rail, MD [place],  [place] [address] PCP: Binnie Rail, MD   Assessment & Plan: Visit Diagnoses:  1. Primary osteoarthritis of right knee   2. Body mass index 45.0-49.9, adult ([place])   3. Morbid obesity ([place])     Plan: I reviewed her x-rays which show a significant amount of degenerative joint disease which is age advanced for her.  I discussed that this is likely a result of her morbid obesity.  If she does not lose significant weight then she has a high probability of needing a knee replacement in the next 5 to 10 years if not sooner.  We will make a referral to Dr. Leafy Ro for weight loss counseling.  I did perform a knee aspiration and injection today to give her some relief.  I was able to aspirate 25 cc of joint fluid.  Questions encouraged and answered.  Follow-up as needed. Total face to face encounter time was greater than 45 minutes and over half of this time was spent in counseling and/or coordination of care.  The patient meets the AMA guidelines for Morbid (severe) obesity with a BMI > 40.0 and I have recommended weight loss.  Follow-Up Instructions: Return if symptoms worsen or fail to improve.   Orders:  Orders Placed This Encounter  Procedures  . Amb Ref to Medical Weight Management   No orders of the defined types were placed in this encounter.     Procedures: Large Joint Inj: R knee on 05/06/2019 5:32 PM Indications: pain Details: 22 G needle  Arthrogram: No  Medications: 40 mg methylPREDNISolone acetate 40 MG/ML; 2 mL lidocaine 1 %; 2 mL bupivacaine 0.5 % Consent was given by the patient. Patient was prepped and draped in the usual sterile fashion.       Clinical Data: No additional findings.   Subjective: Chief Complaint  Patient presents with  . Right Knee -  Pain    Patient is 38 year old female comes in for evaluation of right knee pain.  She had a semifall on 03/06/2019 which she sat down hard but she really did not fall onto her right knee.  She denies any numbness and tingling.  She endorses throbbing pain and swelling that is worse at night.  She is currently working.  She denies any mechanical symptoms.   Review of Systems  Constitutional: Negative.   HENT: Negative.   Eyes: Negative.   Respiratory: Negative.   Cardiovascular: Negative.   Endocrine: Negative.   Musculoskeletal: Negative.   Neurological: Negative.   Hematological: Negative.   Psychiatric/Behavioral: Negative.   All other systems reviewed and are negative.    Objective: Vital Signs: There were no vitals taken for this visit.  Physical Exam Vitals signs and nursing note reviewed.  Constitutional:      Appearance: She is well-developed.  HENT:     Head: Normocephalic and atraumatic.  Neck:     Musculoskeletal: Neck supple.  Pulmonary:     Effort: Pulmonary effort is normal.  Abdominal:     Palpations: Abdomen is soft.  Skin:    General: Skin is warm.     Capillary Refill: Capillary refill takes less  than 2 seconds.  Neurological:     Mental Status: She is alert and oriented to person, place, and time.  Psychiatric:        Behavior: Behavior normal.        Thought Content: Thought content normal.        Judgment: Judgment normal.     Ortho Exam Right knee exam shows a moderate joint effusion.  Collaterals and cruciates are stable.  Normal range of motion.  No joint line tenderness.  Extensor mechanism intact. Specialty Comments:  No specialty comments available.  Imaging: No results found.   PMFS History: Patient Active Problem List   Diagnosis Date Noted  . Muscular pain 11/13/2018  . Scapular dyskinesis 10/07/2018  . Acute right-sided thoracic back pain 09/25/2018  . Depression 05/10/2018  . Amenorrhea 10/10/2017  . Ventral hernia  without obstruction or gangrene 07/13/2017  . Hearing difficulty 11/24/2016  . Prediabetes 09/06/2015  . Essential hypertension, benign 09/06/2015  . Smoking 12/22/2013  . Osteoarthritis of left knee 08/29/2013  . Pap smear of cervix with ASCUS, cannot exclude HGSIL 06/25/2013  . Fibroid uterus 05/22/2013  . Dysmenorrhea 05/22/2013  . Monilial intertrigo 01/06/2013  . ALLERGIC RHINITIS 04/10/2008  . HEEL PAIN, LEFT 11/22/2007  . DEPENDENT EDEMA, LEGS, BILATERAL 08/06/2007  . Obstructive sleep apnea 03/25/2007  . Eczema 02/21/2007  . OBESITY NOS 01/21/2007  . Asthma 01/21/2007   Past Medical History:  Diagnosis Date  . Anxiety    no official dx  . Asthma   . Eczema   . Fibroids   . GONORRHEA 03/25/2008   Qualifier: Diagnosis of  By: Isla Pence    . Heart murmur   . Heart murmur   . Hypertension   . MRSA (methicillin resistant staph aureus) culture positive 05/2012   cellulits/ left leg  . Obesity     Family History  Problem Relation Age of Onset  . Heart disease Maternal Grandmother   . Diabetes Maternal Grandmother   . Diabetes Father   . Diabetes Mother   . Cancer Maternal Uncle        prostate  . Cancer Paternal Aunt        lung  . Stroke Maternal Grandfather     Past Surgical History:  Procedure Laterality Date  . NO PAST SURGERIES     Social History   Occupational History  . Not on file  Tobacco Use  . Smoking status: Current Every Day Smoker    Packs/day: 0.20    Years: 5.00    Pack years: 1.00    Types: Cigarettes  . Smokeless tobacco: Never Used  Substance and Sexual Activity  . Alcohol use: Yes    Alcohol/week: 0.0 standard drinks    Comment: occ  . Drug use: Yes    Types: Marijuana    Comment: pot-about 1 joint BID  . Sexual activity: Yes    Birth control/protection: Condom

## 2019-05-13 ENCOUNTER — Ambulatory Visit: Payer: BC Managed Care – PPO | Admitting: Internal Medicine

## 2019-05-13 NOTE — Patient Instructions (Addendum)
  Tests ordered today. Your results will be released to [facility] (or called to you) after review.  If any changes need to be made, you will be notified at that same time.    Medications reviewed and updated.  Changes include :  Increased olmesartan 10 mg daily   Your prescription(s) have been submitted to your pharmacy. Please take as directed and contact our office if you believe you are having problem(s) with the medication(s).    Please followup in 3 months

## 2019-05-13 NOTE — Progress Notes (Signed)
Subjective:    Patient ID: Marissa Joseph, female    DOB: [birth year], 38 y.o.   MRN: [id number]  HPI The patient is here for follow up.  She is not exercising regularly.    Headaches:  She has been having headaches.  It is on her right side and left side.  Usually during the day.  She took some sinus medication and it helped.  She does not monitor her blood pressure at home and it has been elevated.  She has had some issues with her teeth and wonders if it is related to that.  She does have a follow-up appointment with her dentist.  She used to grind her teeth, but with some teeth taken out she has not been grinding her teeth so she does not think it is that she typically does not wake up with headaches.  Obstructive sleep apnea: She has been diagnosed with severe sleep apnea.  She is not currently using her BiPAP.  She did mention that she needs to get back to pulmonary.  Obesity: She has gained significant weight over the past few months.  She is not eating like she should.  She is active at work, but not doing any other exercise.  She was referred to weight management clinic by orthopedics, but has not heard from them yet.  She knows she needs to work on the weight loss.  Hypertension: She is taking her medication daily. She is compliant with a low sodium diet.  She denies chest pain, shortness of breath. She does not monitor her blood pressure at home.    Prediabetes:  She is not compliant with a low sugar/carbohydrate diet.  She is not exercising regularly.  Asthma: she takes the symbicort as needed. She takes the albuterol prn.  She has wheezing in the colder weather.  She denies any coughing or shortness of breath.  Severe R knee OA:  She needs a TKR.  Her pain is a little better about 3/10 the past few days.  She is following with orthopedics.    Medications and allergies reviewed with patient and updated if appropriate.  Patient Active Problem List   Diagnosis Date  Noted  . Muscular pain 11/13/2018  . Scapular dyskinesis 10/07/2018  . Acute right-sided thoracic back pain 09/25/2018  . Depression 05/10/2018  . Amenorrhea 10/10/2017  . Ventral hernia without obstruction or gangrene 07/13/2017  . Hearing difficulty 11/24/2016  . Prediabetes 09/06/2015  . Essential hypertension, benign 09/06/2015  . Smoking 12/22/2013  . Osteoarthritis of left knee 08/29/2013  . Pap smear of cervix with ASCUS, cannot exclude HGSIL 06/25/2013  . Fibroid uterus 05/22/2013  . Dysmenorrhea 05/22/2013  . Monilial intertrigo 01/06/2013  . ALLERGIC RHINITIS 04/10/2008  . HEEL PAIN, LEFT 11/22/2007  . DEPENDENT EDEMA, LEGS, BILATERAL 08/06/2007  . Obstructive sleep apnea 03/25/2007  . Eczema 02/21/2007  . OBESITY NOS 01/21/2007  . Asthma 01/21/2007    Current Outpatient Medications on File Prior to Visit  Medication Sig Dispense Refill  . albuterol (VENTOLIN HFA) 108 (90 Base) MCG/ACT inhaler Inhale 2 puffs into the lungs every 6 (six) hours as needed for wheezing. 1 Inhaler 8  . budesonide-formoterol (SYMBICORT) 80-4.5 MCG/ACT inhaler Inhale 2 puffs into the lungs 2 (two) times daily. 1 Inhaler 3  . Diclofenac Sodium 2 % SOLN Place 2 g onto the skin 2 (two) times daily. 112 g 3  . gabapentin (NEURONTIN) 100 MG capsule Take 2 capsules (200 mg total) by mouth  at bedtime. 60 capsule 3  . hydrochlorothiazide (HYDRODIURIL) 25 MG tablet Take 1 tablet (25 mg total) by mouth daily. 90 tablet 1  . montelukast (SINGULAIR) 10 MG tablet Take 1 tablet (10 mg total) by mouth at bedtime. 90 tablet 1  . naproxen (NAPROSYN) 500 MG tablet Take 1 tablet (500 mg total) by mouth 2 (two) times daily with a meal. 14 tablet 0  . olmesartan (BENICAR) 5 MG tablet Take 1 tablet (5 mg total) by mouth daily. 90 tablet 1  . Vitamin D, Ergocalciferol, (DRISDOL) 1.25 MG (50000 UT) CAPS capsule Take 1 capsule (50,000 Units total) by mouth every 7 (seven) days. 12 capsule 0   No current  facility-administered medications on file prior to visit.     Past Medical History:  Diagnosis Date  . Anxiety    no official dx  . Asthma   . Eczema   . Fibroids   . GONORRHEA 03/25/2008   Qualifier: Diagnosis of  By: Isla Pence    . Heart murmur   . Heart murmur   . Hypertension   . MRSA (methicillin resistant staph aureus) culture positive 05/2012   cellulits/ left leg  . Obesity     Past Surgical History:  Procedure Laterality Date  . NO PAST SURGERIES      Social History   Socioeconomic History  . Marital status: Single    Spouse name: Not on file  . Number of children: Not on file  . Years of education: Not on file  . Highest education level: Not on file  Occupational History  . Not on file  Social Needs  . Financial resource strain: Not on file  . Food insecurity    Worry: Not on file    Inability: Not on file  . Transportation needs    Medical: Not on file    Non-medical: Not on file  Tobacco Use  . Smoking status: Current Every Day Smoker    Packs/day: 0.20    Years: 5.00    Pack years: 1.00    Types: Cigarettes  . Smokeless tobacco: Never Used  Substance and Sexual Activity  . Alcohol use: Yes    Alcohol/week: 0.0 standard drinks    Comment: occ  . Drug use: Yes    Types: Marijuana    Comment: pot-about 1 joint BID  . Sexual activity: Yes    Birth control/protection: Condom  Lifestyle  . Physical activity    Days per week: Not on file    Minutes per session: Not on file  . Stress: Not on file  Relationships  . Social Herbalist on phone: Not on file    Gets together: Not on file    Attends religious service: Not on file    Active member of club or organization: Not on file    Attends meetings of clubs or organizations: Not on file    Relationship status: Not on file  Other Topics Concern  . Not on file  Social History Narrative  . Not on file    Family History  Problem Relation Age of Onset  . Heart disease  Maternal Grandmother   . Diabetes Maternal Grandmother   . Diabetes Father   . Diabetes Mother   . Cancer Maternal Uncle        prostate  . Cancer Paternal Aunt        lung  . Stroke Maternal Grandfather     Review of Systems  Constitutional:  Negative for chills and fever.  Eyes: Positive for visual disturbance (blurry vision).  Respiratory: Positive for wheezing. Negative for cough and shortness of breath.   Cardiovascular: Positive for palpitations (occ) and leg swelling. Negative for chest pain.  Musculoskeletal: Positive for arthralgias.  Neurological: Positive for headaches. Negative for dizziness and light-headedness.       Objective:   Vitals:   05/14/19 1529  BP: (!) 150/98  Pulse: 80  Resp: 18  Temp: 98.3 F (36.8 C)  SpO2: 96%   BP Readings from Last 3 Encounters:  05/14/19 (!) 150/98  03/06/19 138/86  10/07/18 132/84   Wt Readings from Last 3 Encounters:  05/14/19 (!) 301 lb 12.8 oz (136.9 kg)  10/07/18 280 lb (127 kg)  08/07/18 283 lb 3.2 oz (128.5 kg)   Body mass index is 51.8 kg/m.   Physical Exam    Constitutional: Appears well-developed and well-nourished. No distress.  HENT:  Head: Normocephalic and atraumatic.  Neck: Neck supple. No tracheal deviation present. No thyromegaly present.  No cervical lymphadenopathy Cardiovascular: Normal rate, regular rhythm and normal heart sounds.  No murmur heard. No carotid bruit .  Mild bilateral lower extremity edema Pulmonary/Chest: Effort normal and breath sounds normal. No respiratory distress. No has no wheezes. No rales.  Skin: Skin is warm and dry. Not diaphoretic.  Psychiatric: Normal mood and affect. Behavior is normal.      Assessment & Plan:    See Problem List for Assessment and Plan of chronic medical problems.     This visit occurred during the SARS-CoV-2 public health emergency.  Safety protocols were in place, including screening questions prior to the visit, additional usage of  staff PPE, and extensive cleaning of exam room while observing appropriate contact time as indicated for disinfecting solutions.

## 2019-05-14 ENCOUNTER — Other Ambulatory Visit: Payer: Self-pay

## 2019-05-14 ENCOUNTER — Other Ambulatory Visit (INDEPENDENT_AMBULATORY_CARE_PROVIDER_SITE_OTHER): Payer: PRIVATE HEALTH INSURANCE

## 2019-05-14 ENCOUNTER — Ambulatory Visit (INDEPENDENT_AMBULATORY_CARE_PROVIDER_SITE_OTHER): Payer: PRIVATE HEALTH INSURANCE | Admitting: Internal Medicine

## 2019-05-14 ENCOUNTER — Encounter: Payer: Self-pay | Admitting: Internal Medicine

## 2019-05-14 VITALS — BP 150/98 | HR 80 | Temp 98.3°F | Resp 18 | Ht 64.0 in | Wt 301.8 lb

## 2019-05-14 DIAGNOSIS — I10 Essential (primary) hypertension: Secondary | ICD-10-CM

## 2019-05-14 DIAGNOSIS — R7303 Prediabetes: Secondary | ICD-10-CM

## 2019-05-14 DIAGNOSIS — J4520 Mild intermittent asthma, uncomplicated: Secondary | ICD-10-CM | POA: Diagnosis not present

## 2019-05-14 DIAGNOSIS — G4733 Obstructive sleep apnea (adult) (pediatric): Secondary | ICD-10-CM

## 2019-05-14 DIAGNOSIS — Z6841 Body Mass Index (BMI) 40.0 and over, adult: Secondary | ICD-10-CM

## 2019-05-14 LAB — COMPREHENSIVE METABOLIC PANEL
ALT: 10 U/L (ref 0–35)
AST: 13 U/L (ref 0–37)
Albumin: 3.6 g/dL (ref 3.5–5.2)
Alkaline Phosphatase: 54 U/L (ref 39–117)
BUN: 17 mg/dL (ref 6–23)
CO2: 28 mEq/L (ref 19–32)
Calcium: 8.8 mg/dL (ref 8.4–10.5)
Chloride: 105 mEq/L (ref 96–112)
Creatinine, Ser: 0.75 mg/dL (ref 0.40–1.20)
GFR: 104.61 mL/min (ref >60.00)
Glucose, Bld: 97 mg/dL (ref 70–99)
Potassium: 3.3 mEq/L — ABNORMAL LOW (ref 3.5–5.1)
Sodium: 138 mEq/L (ref 135–145)
Total Bilirubin: 0.3 mg/dL (ref 0.2–1.2)
Total Protein: 6.8 g/dL (ref 6.0–8.3)

## 2019-05-14 LAB — CBC WITH DIFFERENTIAL/PLATELET
Basophils Absolute: 0 10*3/uL (ref 0.0–0.1)
Basophils Relative: 0.6 % (ref 0.0–3.0)
Eosinophils Absolute: 0.2 10*3/uL (ref 0.0–0.7)
Eosinophils Relative: 2.6 % (ref 0.0–5.0)
HCT: 37.5 % (ref 36.0–46.0)
Hemoglobin: 12.5 g/dL (ref 12.0–15.0)
Lymphocytes Relative: 29.5 % (ref 12.0–46.0)
Lymphs Abs: 1.8 10*3/uL (ref 0.7–4.0)
MCHC: 33.2 g/dL (ref 30.0–36.0)
MCV: 94.9 fl (ref 78.0–100.0)
Monocytes Absolute: 0.7 10*3/uL (ref 0.1–1.0)
Monocytes Relative: 11.3 % (ref 3.0–12.0)
Neutro Abs: 3.4 10*3/uL (ref 1.4–7.7)
Neutrophils Relative %: 56 % (ref 43.0–77.0)
Platelets: 207 10*3/uL (ref 150.0–400.0)
RBC: 3.95 Mil/uL (ref 3.87–5.11)
RDW: 13 % (ref 11.5–15.5)
WBC: 6.1 10*3/uL (ref 4.0–10.5)

## 2019-05-14 LAB — LIPID PANEL
Cholesterol: 104 mg/dL (ref 0–200)
HDL: 36 mg/dL — ABNORMAL LOW (ref >39.00)
LDL Cholesterol: 58 mg/dL (ref 0–99)
NonHDL: 68.36
Total CHOL/HDL Ratio: 3
Triglycerides: 53 mg/dL (ref 0.0–149.0)
VLDL: 10.6 mg/dL (ref 0.0–40.0)

## 2019-05-14 MED ORDER — BUDESONIDE-FORMOTEROL FUMARATE 80-4.5 MCG/ACT IN AERO: 2.0000 | INHALATION_SPRAY | Freq: Two times a day (BID) | RESPIRATORY_TRACT | 3 refills | Status: DC

## 2019-05-14 MED ORDER — HYDROCHLOROTHIAZIDE 25 MG PO TABS: 25.0000 mg | ORAL_TABLET | Freq: Every day | ORAL | 1 refills | Status: DC

## 2019-05-14 MED ORDER — OLMESARTAN MEDOXOMIL 5 MG PO TABS: 10.0000 mg | ORAL_TABLET | Freq: Every day | ORAL | 1 refills | Status: DC

## 2019-05-14 NOTE — Assessment & Plan Note (Signed)
Blood pressure not controlled Goal less than 130/80 Continue hydrochlorothiazide 25 mg daily-I do not think she is taking this consistently.  It does increase her urination and she does not like taking it in the morning and sometimes forgets to take it later on.  The stressed the importance of taking this on a daily basis.  Discussed possibly changing it to something else, but she does need the water pill because of edema Increase olmesartan to 10 mg daily Stressed the importance of compliance with BiPAP and weight loss Follow-up in 3 months CBC, CMP, TSH

## 2019-05-14 NOTE — Assessment & Plan Note (Signed)
With uncontrolled hypertension, prediabetes, severe OSA, severe osteoarthritis Has been referred to weight management clinic-waiting for appointment Stressed the importance of weight loss and discussed that this can increase her risk of cancer, worsening arthritis, heart problems, lung problems She understands the importance and need for weight loss Discussed increasing lean protein and vegetables.  Decrease sugars/carbohydrates.  Decrease portions She is active with work, but discussed the importance of increasing her activity as much as possible Follow-up in 3 months

## 2019-05-14 NOTE — Assessment & Plan Note (Signed)
Check a1c Low sugar / carb diet Stressed regular exercise Stressed weight loss

## 2019-05-14 NOTE — Assessment & Plan Note (Signed)
Not using BiPAP Has gained significant amount of weight Stressed the importance of treating OSA and consequences of not treating it We will follow-up with pulmonary Stressed weight loss-has been referred to the weight management clinic

## 2019-05-14 NOTE — Assessment & Plan Note (Signed)
Mild, intermittent Using Symbicort and albuterol as needed Has wheezing recently related to cold weather, but denies any cough or wheeze Can continue inhalers as needed

## 2019-05-15 ENCOUNTER — Encounter: Payer: Self-pay | Admitting: Internal Medicine

## 2019-05-15 LAB — HEMOGLOBIN A1C: Hgb A1c MFr Bld: 5.2 % (ref 4.6–6.5)

## 2019-05-15 LAB — TSH: TSH: 0.84 u[IU]/mL (ref 0.35–4.50)

## 2019-06-19 ENCOUNTER — Encounter: Payer: Self-pay | Admitting: Internal Medicine

## 2019-06-24 ENCOUNTER — Encounter: Payer: Self-pay | Admitting: Orthopaedic Surgery

## 2019-06-24 ENCOUNTER — Other Ambulatory Visit: Payer: Self-pay

## 2019-06-25 ENCOUNTER — Other Ambulatory Visit: Payer: Self-pay

## 2019-06-25 DIAGNOSIS — M1711 Unilateral primary osteoarthritis, right knee: Secondary | ICD-10-CM

## 2019-06-25 NOTE — Telephone Encounter (Signed)
Let's get an MRI of the knee please.  Thanks.  Please let her know

## 2019-07-01 NOTE — Telephone Encounter (Signed)
We can take her out of work or put her on desk duty until follow up

## 2019-07-17 ENCOUNTER — Telehealth: Payer: Self-pay

## 2019-07-17 ENCOUNTER — Encounter (HOSPITAL_COMMUNITY): Payer: Self-pay

## 2019-07-17 ENCOUNTER — Ambulatory Visit (HOSPITAL_COMMUNITY)
Admission: EM | Admit: 2019-07-17 | Discharge: 2019-07-17 | Disposition: A | Discharge status: Home / Self Care | Payer: PRIVATE HEALTH INSURANCE | Attending: Internal Medicine | Admitting: Internal Medicine

## 2019-07-17 ENCOUNTER — Other Ambulatory Visit: Payer: Self-pay

## 2019-07-17 DIAGNOSIS — I10 Essential (primary) hypertension: Secondary | ICD-10-CM | POA: Diagnosis not present

## 2019-07-17 MED ORDER — MONTELUKAST SODIUM 10 MG PO TABS: 10.0000 mg | ORAL_TABLET | Freq: Every day | ORAL | 1 refills | Status: DC

## 2019-07-17 MED ORDER — DICLOFENAC SODIUM 1 % EX GEL: 4.0000 g | Freq: Four times a day (QID) | CUTANEOUS | 0 refills | Status: AC | PRN

## 2019-07-17 NOTE — ED Triage Notes (Signed)
Pt states she was at dental appointment and had BP measured. Pt states BP was 192/121 and was advised to come here for eval. Took HCTZ and only one pill of olmesartan (Rx 10mg  once/day). Denies HA at present.   Pt states in pain right knee s/p fall on Monday; wearing compression sleeve.  Also need refill of singulair.

## 2019-07-17 NOTE — Discharge Instructions (Signed)
Please take hydrochlorothiazide in AM and olmesartan at bedtime

## 2019-07-17 NOTE — Telephone Encounter (Signed)
Pt states elevated BP will not come down. Transferred call to [facility] for Nurse Triage.

## 2019-07-17 NOTE — ED Provider Notes (Signed)
[facility]    CSN: [id number] Arrival date & time: 07/17/19  1651      History   Chief Complaint Chief Complaint  Patient presents with  . Hypertension    HPI Marissa Joseph is a 39 y.o. female with a history of hypertension currently on hydrochlorothiazide and olmesartan comes to urgent care on the advice her dentist.  She went and got dental extraction today.  During that visit she was noted to have a blood pressure of 192/121.  She was advised to come to the urgent care to be evaluated.  Patient took hydrochlorothiazide only in the morning.  She is currently on 2 medications as stated above.  Patient had a mild headache at that time and also has chronic right knee pain.  She denies any headaches at this time, no chest pain or chest pressure and no abdominal pain.  No shortness of breath, orthopnea or paroxysmal nocturnal dyspnea.  No calf pain.  No fever or chills.   HPI  Past Medical History:  Diagnosis Date  . Anxiety    no official dx  . Asthma   . Eczema   . Fibroids   . GONORRHEA 03/25/2008   Qualifier: Diagnosis of  By: Isla Pence    . Heart murmur   . Heart murmur   . Hypertension   . MRSA (methicillin resistant staph aureus) culture positive 05/2012   cellulits/ left leg  . Obesity     Patient Active Problem List   Diagnosis Date Noted  . Muscular pain 11/13/2018  . Scapular dyskinesis 10/07/2018  . Acute right-sided thoracic back pain 09/25/2018  . Depression 05/10/2018  . Amenorrhea 10/10/2017  . Ventral hernia without obstruction or gangrene 07/13/2017  . Hearing difficulty 11/24/2016  . Prediabetes 09/06/2015  . Essential hypertension, benign 09/06/2015  . Smoking 12/22/2013  . Osteoarthritis of left knee 08/29/2013  . Pap smear of cervix with ASCUS, cannot exclude HGSIL 06/25/2013  . Fibroid uterus 05/22/2013  . Dysmenorrhea 05/22/2013  . Monilial intertrigo 01/06/2013  . ALLERGIC RHINITIS 04/10/2008  . HEEL PAIN, LEFT  11/22/2007  . DEPENDENT EDEMA, LEGS, BILATERAL 08/06/2007  . Obstructive sleep apnea 03/25/2007  . Eczema 02/21/2007  . OBESITY NOS 01/21/2007  . Asthma 01/21/2007    Past Surgical History:  Procedure Laterality Date  . NO PAST SURGERIES      OB History    Gravida  0   Para      Term      Preterm      AB      Living        SAB      TAB      Ectopic      Multiple      Live Births               Home Medications    Prior to Admission medications   Medication Sig Start Date End Date Taking? Authorizing Provider  albuterol (VENTOLIN HFA) 108 (90 Base) MCG/ACT inhaler Inhale 2 puffs into the lungs every 6 (six) hours as needed for wheezing. 09/25/18  Yes Burns, Claudina Lick, MD  budesonide-formoterol (SYMBICORT) 80-4.5 MCG/ACT inhaler Inhale 2 puffs into the lungs 2 (two) times daily. 05/14/19  Yes Burns, Claudina Lick, MD  hydrochlorothiazide (HYDRODIURIL) 25 MG tablet Take 1 tablet (25 mg total) by mouth daily. 05/14/19  Yes Burns, Claudina Lick, MD  olmesartan (BENICAR) 5 MG tablet Take 2 tablets (10 mg total) by mouth daily. 05/14/19  Yes Binnie Rail, MD  diclofenac Sodium (VOLTAREN) 1 % GEL Apply 4 g topically 4 (four) times daily as needed. 07/17/19   Digna Countess, Myrene Galas, MD  gabapentin (NEURONTIN) 100 MG capsule Take 2 capsules (200 mg total) by mouth at bedtime. 10/07/18   Lyndal Pulley, DO  montelukast (SINGULAIR) 10 MG tablet Take 1 tablet (10 mg total) by mouth at bedtime. 07/17/19   LampteyMyrene Galas, MD    Family History Family History  Problem Relation Age of Onset  . Heart disease Maternal Grandmother   . Diabetes Maternal Grandmother   . Diabetes Father   . Diabetes Mother   . Cancer Maternal Uncle        prostate  . Cancer Paternal Aunt        lung  . Stroke Maternal Grandfather     Social History Social History   Tobacco Use  . Smoking status: Current Every Day Smoker    Packs/day: 0.20    Years: 5.00    Pack years: 1.00    Types: Cigarettes  .  Smokeless tobacco: Never Used  Substance Use Topics  . Alcohol use: Yes    Alcohol/week: 0.0 standard drinks    Comment: occ  . Drug use: Yes    Types: Marijuana    Comment: pot-about 1 joint BID     Allergies   Avocado and Shellfish allergy   Review of Systems Review of Systems  Constitutional: Negative for activity change, chills, fatigue and fever.  Respiratory: Negative for chest tightness and stridor.   Cardiovascular: Negative for chest pain and palpitations.  Gastrointestinal: Negative for abdominal pain, nausea and vomiting.  Skin: Negative for wound.  Neurological: Negative for dizziness, light-headedness and headaches.  Psychiatric/Behavioral: Negative for confusion and decreased concentration.     Physical Exam Triage Vital Signs ED Triage Vitals [07/17/19 1714]  Enc Vitals Group     BP      Pulse      Resp      Temp      Temp src      SpO2      Weight      Height      Head Circumference      Peak Flow      Pain Score 7     Pain Loc      Pain Edu?      Excl. in [place]?    No data found.  Updated Vital Signs BP (!) 138/91 (BP Location: Left Arm)   Pulse 76   Temp 98.4 F (36.9 C) (Oral)   Resp 20   LMP 07/10/2019   SpO2 100%   Visual Acuity Right Eye Distance:   Left Eye Distance:   Bilateral Distance:    Right Eye Near:   Left Eye Near:    Bilateral Near:     Physical Exam Vitals and nursing note reviewed.  Constitutional:      Appearance: She is not ill-appearing or toxic-appearing.  Cardiovascular:     Rate and Rhythm: Normal rate and regular rhythm.     Pulses: Normal pulses.     Heart sounds: Normal heart sounds. No murmur. No friction rub.  Pulmonary:     Effort: Pulmonary effort is normal. No respiratory distress.     Breath sounds: Normal breath sounds. No wheezing or rhonchi.  Abdominal:     General: Bowel sounds are normal. There is no distension.     Hernia: No hernia is present.  Musculoskeletal:  General: No  swelling or tenderness. Normal range of motion.  Skin:    General: Skin is warm.     Capillary Refill: Capillary refill takes less than 2 seconds.     Coloration: Skin is not jaundiced.     Findings: No erythema.  Neurological:     General: No focal deficit present.     Mental Status: She is alert and oriented to person, place, and time.     Cranial Nerves: No cranial nerve deficit.     Sensory: No sensory deficit.      UC Treatments / Results  Labs (all labs ordered are listed, but only abnormal results are displayed) Labs Reviewed - No data to display  EKG   Radiology No results found.  Procedures Procedures (including critical care time)  Medications Ordered in UC Medications - No data to display  Initial Impression / Assessment and Plan / UC Course  I have reviewed the triage vital signs and the nursing notes.  Pertinent labs & imaging results that were available during my care of the patient were reviewed by me and considered in my medical decision making (see chart for details).     1.  Hypertension, controlled Patient is advised the be compliant with her medications.  Advised her to take hydrochlorothiazide in the morning and olmesartan at bedtime.  Previously she had some dizziness on taking both hydrochlorothiazide and 10 mg olmesartan at the same time.  Patient verbalized understanding of the plan.  Singulair refill was sent to the pharmacy. Final Clinical Impressions(s) / UC Diagnoses   Final diagnoses:  Essential hypertension     Discharge Instructions     Please take hydrochlorothiazide in AM and olmesartan at bedtime    ED Prescriptions    Medication Sig Dispense Auth. Provider   montelukast (SINGULAIR) 10 MG tablet Take 1 tablet (10 mg total) by mouth at bedtime. 90 tablet Anjeli Casad, Myrene Galas, MD   diclofenac Sodium (VOLTAREN) 1 % GEL Apply 4 g topically 4 (four) times daily as needed. 150 g Clarabel Marion, Myrene Galas, MD     PDMP not reviewed this  encounter.   Chase Picket, MD 07/17/19 [phone]

## 2019-07-18 NOTE — Telephone Encounter (Signed)
Spoke with pt. She states that she was having trouble with the increased olmesartan causing dizziness. She is going to try to take it at night and the hctz in the morning. She states she has been taking her BP meds as prescribed she just thinks she was nervous for the tooth extraction. She will keep check on BP over the weekend and an appointment was made for Tuesday.

## 2019-07-18 NOTE — Telephone Encounter (Signed)
Per med list she should be taking 10 mg olmesartan and hctz 25 mg daily -- is that what she is taking?   Will adjust now, but she needs to be seen Monday or Tuesday in office.  If she has any vision changes, severe HA, dizziness over the weekend needs to go to urgent care/ED

## 2019-07-18 NOTE — Telephone Encounter (Signed)
Noted  

## 2019-07-18 NOTE — Telephone Encounter (Signed)
Patient spoke with [facility] on  07/17/2019 4:10:34 PM Caller states that she is trying to get a tooth extracted but her blood pressure is 172/113 at 3:30pm 192/121 at 3:45pm. Caller takes olmesartan and would like to know if she needs to take a 2nd one. Caller states that she has a slight headache.No dizziness, no vision changes at this moment.  Patient was advised SEE HCP WITHIN 4 HOURS (OR PCP TRIAGE): * IF OFFICE WILL BE OPEN: You need to be seen within the next 3 or 4 hours. Call your doctor (or NP/PA) now or as soon as the office opens. CALL BACK IF: * Weakness or numbness of the face, arm or leg on one side of the body occurs * Difficulty walking, difficulty talking, or severe headache occurs * Chest pain or difficulty breathing occurs * You become worse. CARE ADVICE given per High Blood Pressure (Adult) guideline.

## 2019-07-21 NOTE — Patient Instructions (Addendum)
  Medications reviewed and updated.  Changes include :   none       

## 2019-07-21 NOTE — Progress Notes (Signed)
Subjective:    Patient ID: Marissa Joseph, female    DOB: 1980-08-23, 39 y.o.   MRN: [id number]  HPI The patient is here for follow up of their chronic medical problems, including hypertension.   Her BP has been high  --    2/11 she went to urgent care on advice of dentist.  She had a tooth extracted that day.  BP at dentist's office was 192/121.  She had a mild headache at the time.  She had no other symptoms.  She was having some dizziness from the olmesartan and hctz together.  She had decreased her olmesartan dose back to 5 mg because of that.  Urgent care advised her to take the benicar at night, which she has started doing and is taking the 10 mg.  BP at urgent care 138/91.  No changes in medication were made.     They day she went to the dentist she had fallen and was in pain from the fall.  She was concerned about the tooth and thinks that is why her BP was high.    She does not have a cuff at home.  She is taking her medication daily now, but was not previously.       Medications and allergies reviewed with patient and updated if appropriate.  Patient Active Problem List   Diagnosis Date Noted  . Muscular pain 11/13/2018  . Scapular dyskinesis 10/07/2018  . Acute right-sided thoracic back pain 09/25/2018  . Depression 05/10/2018  . Amenorrhea 10/10/2017  . Ventral hernia without obstruction or gangrene 07/13/2017  . Hearing difficulty 11/24/2016  . Prediabetes 09/06/2015  . Essential hypertension, benign 09/06/2015  . Smoking 12/22/2013  . Osteoarthritis of left knee 08/29/2013  . Pap smear of cervix with ASCUS, cannot exclude HGSIL 06/25/2013  . Fibroid uterus 05/22/2013  . Dysmenorrhea 05/22/2013  . Monilial intertrigo 01/06/2013  . ALLERGIC RHINITIS 04/10/2008  . HEEL PAIN, LEFT 11/22/2007  . DEPENDENT EDEMA, LEGS, BILATERAL 08/06/2007  . Obstructive sleep apnea 03/25/2007  . Eczema 02/21/2007  . OBESITY NOS 01/21/2007  . Asthma 01/21/2007     Current Outpatient Medications on File Prior to Visit  Medication Sig Dispense Refill  . albuterol (VENTOLIN HFA) 108 (90 Base) MCG/ACT inhaler Inhale 2 puffs into the lungs every 6 (six) hours as needed for wheezing. 1 Inhaler 8  . budesonide-formoterol (SYMBICORT) 80-4.5 MCG/ACT inhaler Inhale 2 puffs into the lungs 2 (two) times daily. 1 Inhaler 3  . diclofenac Sodium (VOLTAREN) 1 % GEL Apply 4 g topically 4 (four) times daily as needed. 150 g 0  . gabapentin (NEURONTIN) 100 MG capsule Take 2 capsules (200 mg total) by mouth at bedtime. 60 capsule 3  . hydrochlorothiazide (HYDRODIURIL) 25 MG tablet Take 1 tablet (25 mg total) by mouth daily. 90 tablet 1  . montelukast (SINGULAIR) 10 MG tablet Take 1 tablet (10 mg total) by mouth at bedtime. 90 tablet 1  . olmesartan (BENICAR) 5 MG tablet Take 2 tablets (10 mg total) by mouth daily. 180 tablet 1   No current facility-administered medications on file prior to visit.    Past Medical History:  Diagnosis Date  . Anxiety    no official dx  . Asthma   . Eczema   . Fibroids   . GONORRHEA 03/25/2008   Qualifier: Diagnosis of  By: Isla Pence    . Heart murmur   . Heart murmur   . Hypertension   . MRSA (methicillin resistant  staph aureus) culture positive 05/2012   cellulits/ left leg  . Obesity     Past Surgical History:  Procedure Laterality Date  . NO PAST SURGERIES      Social History   Socioeconomic History  . Marital status: Single    Spouse name: Not on file  . Number of children: Not on file  . Years of education: Not on file  . Highest education level: Not on file  Occupational History  . Not on file  Tobacco Use  . Smoking status: Current Every Day Smoker    Packs/day: 0.20    Years: 5.00    Pack years: 1.00    Types: Cigarettes  . Smokeless tobacco: Never Used  Substance and Sexual Activity  . Alcohol use: Yes    Alcohol/week: 0.0 standard drinks    Comment: occ  . Drug use: Yes    Types:  Marijuana    Comment: pot-about 1 joint BID  . Sexual activity: Yes    Birth control/protection: Condom  Other Topics Concern  . Not on file  Social History Narrative  . Not on file   Social Determinants of Health   Financial Resource Strain:   . Difficulty of Paying Living Expenses: Not on file  Food Insecurity:   . Worried About Charity fundraiser in the Last Year: Not on file  . Ran Out of Food in the Last Year: Not on file  Transportation Needs:   . Lack of Transportation (Medical): Not on file  . Lack of Transportation (Non-Medical): Not on file  Physical Activity:   . Days of Exercise per Week: Not on file  . Minutes of Exercise per Session: Not on file  Stress:   . Feeling of Stress : Not on file  Social Connections:   . Frequency of Communication with Friends and Family: Not on file  . Frequency of Social Gatherings with Friends and Family: Not on file  . Attends Religious Services: Not on file  . Active Member of Clubs or Organizations: Not on file  . Attends Archivist Meetings: Not on file  . Marital Status: Not on file    Family History  Problem Relation Age of Onset  . Heart disease Maternal Grandmother   . Diabetes Maternal Grandmother   . Diabetes Father   . Diabetes Mother   . Cancer Maternal Uncle        prostate  . Cancer Paternal Aunt        lung  . Stroke Maternal Grandfather     Review of Systems  Constitutional: Negative for fever.  Respiratory: Negative for cough, shortness of breath and wheezing.   Cardiovascular: Positive for leg swelling. Negative for chest pain and palpitations.  Neurological: Positive for headaches (occ).       Objective:   Vitals:   07/22/19 0805  BP: 138/86  Pulse: 76  Resp: 16  Temp: 98.2 F (36.8 C)  SpO2: 96%   BP Readings from Last 3 Encounters:  07/22/19 138/86  07/17/19 (!) 138/91  05/14/19 (!) 150/98   Wt Readings from Last 3 Encounters:  07/22/19 (!) 304 lb (137.9 kg)  05/14/19  (!) 301 lb 12.8 oz (136.9 kg)  10/07/18 280 lb (127 kg)   Body mass index is 52.18 kg/m.   Physical Exam    Constitutional: Appears well-developed and well-nourished. No distress.  HENT:  Head: Normocephalic and atraumatic.  Neck: Neck supple. No tracheal deviation present. No thyromegaly present.  No  cervical lymphadenopathy Cardiovascular: Normal rate, regular rhythm and normal heart sounds.   No murmur heard.   B/l mild LE edema Pulmonary/Chest: Effort normal and breath sounds normal. No respiratory distress. No has no wheezes. No rales.  Skin: Skin is warm and dry. Not diaphoretic.  Psychiatric: Normal mood and affect. Behavior is normal.      Assessment & Plan:    See Problem List for Assessment and Plan of chronic medical problems.    This visit occurred during the SARS-CoV-2 public health emergency.  Safety protocols were in place, including screening questions prior to the visit, additional usage of staff PPE, and extensive cleaning of exam room while observing appropriate contact time as indicated for disinfecting solutions.

## 2019-07-22 ENCOUNTER — Encounter: Payer: Self-pay | Admitting: Internal Medicine

## 2019-07-22 ENCOUNTER — Other Ambulatory Visit: Payer: Self-pay

## 2019-07-22 ENCOUNTER — Ambulatory Visit (INDEPENDENT_AMBULATORY_CARE_PROVIDER_SITE_OTHER): Payer: PRIVATE HEALTH INSURANCE | Admitting: Internal Medicine

## 2019-07-22 VITALS — BP 138/86 | HR 76 | Temp 98.2°F | Resp 16 | Ht 64.0 in | Wt 304.0 lb

## 2019-07-22 DIAGNOSIS — I10 Essential (primary) hypertension: Secondary | ICD-10-CM

## 2019-07-22 NOTE — Assessment & Plan Note (Signed)
Chronic High reading his week - likely partially related to pain and anxiety, but was also only taking 5 mg of benicar Has been taking hctz in am and 10 mg of benicar at night BP still high here today She is resistant to making any changes at this time - she wants to give it more time Discussed BP goal of < 130/80 and discussed concerns w/ uncontrolled htn Will followup next month  Stressed regular exercise, weight loss, low sodium diet

## 2019-07-26 ENCOUNTER — Other Ambulatory Visit: Payer: Self-pay

## 2019-07-26 ENCOUNTER — Ambulatory Visit
Admission: RE | Admit: 2019-07-26 | Discharge: 2019-07-26 | Disposition: A | Discharge status: Home / Self Care | Payer: PRIVATE HEALTH INSURANCE | Source: Ambulatory Visit | Attending: Orthopaedic Surgery | Admitting: Orthopaedic Surgery

## 2019-07-26 DIAGNOSIS — M1711 Unilateral primary osteoarthritis, right knee: Secondary | ICD-10-CM

## 2019-07-29 ENCOUNTER — Other Ambulatory Visit: Payer: Self-pay

## 2019-07-29 ENCOUNTER — Encounter: Payer: Self-pay | Admitting: Orthopaedic Surgery

## 2019-07-29 ENCOUNTER — Ambulatory Visit (INDEPENDENT_AMBULATORY_CARE_PROVIDER_SITE_OTHER): Payer: PRIVATE HEALTH INSURANCE | Admitting: Orthopaedic Surgery

## 2019-07-29 DIAGNOSIS — M2391 Unspecified internal derangement of right knee: Secondary | ICD-10-CM | POA: Insufficient documentation

## 2019-07-29 DIAGNOSIS — S83281A Other tear of lateral meniscus, current injury, right knee, initial encounter: Secondary | ICD-10-CM | POA: Diagnosis not present

## 2019-07-29 DIAGNOSIS — M1711 Unilateral primary osteoarthritis, right knee: Secondary | ICD-10-CM

## 2019-07-29 DIAGNOSIS — Z6841 Body Mass Index (BMI) 40.0 and over, adult: Secondary | ICD-10-CM

## 2019-07-29 DIAGNOSIS — S83241A Other tear of medial meniscus, current injury, right knee, initial encounter: Secondary | ICD-10-CM

## 2019-07-29 NOTE — Progress Notes (Signed)
Office Visit Note   Patient: Marissa Joseph           Date of Birth: 1981/02/03           MRN: [id number] Visit Date: 07/29/2019              Requested by: Binnie Rail, MD [place],   [id number] PCP: Binnie Rail, MD   Assessment & Plan: Visit Diagnoses:  1. Primary osteoarthritis of right knee   2. Acute lateral meniscus tear of right knee, initial encounter   3. Acute medial meniscus tear of right knee, initial encounter   4. Body mass index 50.0-59.9, adult ([place])   5. Morbid obesity ([place])     Plan: MRI of the right knee shows tricompartmental DJD.  She also has medial and lateral meniscal tears with a para meniscal cyst near the lateral meniscus.  She has moderate joint effusion with significant synovitis.  These findings were reviewed with the patient in detail.  After extensive discussion and treatment options patient will continue to work on weight loss.  Work note was provided today.  We discussed the possibility of arthroscopic debridement with the hopes of giving her some relief but she understands that there is no guarantee that this will provide her with the pain relief that she is looking for.  She would also like to do some research into the viscosupplementation.  Questions encouraged and answered.  We will see her back as needed.  Total face to face encounter time was greater than 25 minutes and over half of this time was spent in counseling and/or coordination of care.  Follow-Up Instructions: Return if symptoms worsen or fail to improve.   Orders:  No orders of the defined types were placed in this encounter.  No orders of the defined types were placed in this encounter.     Procedures: No procedures performed   Clinical Data: No additional findings.   Subjective: Chief Complaint  Patient presents with  . Right Knee - Pain    Patient returns today for MRI review of the right knee.  She has been wearing a compression  sleeve that goes all the way of her thigh which has helped.  She still continues to have lateral knee pain and she is falling as a result.  She has been contacted by Dr. Leafy Ro for weight loss management.  She is having significant difficulty performing her job at [employer].  She is very frustrated and upset that she is having a lot of knee problems.   Review of Systems   Objective: Vital Signs: LMP 07/10/2019   Physical Exam  Ortho Exam Right knee exam shows a recurrent joint effusion.  She has lateral joint line tenderness.  Pain with [place] testing lateral joint line. Specialty Comments:  No specialty comments available.  Imaging: No results found.   PMFS History: Patient Active Problem List   Diagnosis Date Noted  . Primary osteoarthritis of right knee 07/29/2019  . Acute lateral meniscus tear of right knee 07/29/2019  . Acute medial meniscus tear of right knee 07/29/2019  . Body mass index 50.0-59.9, adult ([place]) 07/29/2019  . Morbid obesity ([place]) 07/29/2019  . Muscular pain 11/13/2018  . Scapular dyskinesis 10/07/2018  . Acute right-sided thoracic back pain 09/25/2018  . Depression 05/10/2018  . Amenorrhea 10/10/2017  . Ventral hernia without obstruction or gangrene 07/13/2017  . Hearing difficulty 11/24/2016  . Prediabetes 09/06/2015  . Essential hypertension, benign 09/06/2015  .  Smoking 12/22/2013  . Osteoarthritis of left knee 08/29/2013  . Pap smear of cervix with ASCUS, cannot exclude HGSIL 06/25/2013  . Fibroid uterus 05/22/2013  . Dysmenorrhea 05/22/2013  . Monilial intertrigo 01/06/2013  . ALLERGIC RHINITIS 04/10/2008  . HEEL PAIN, LEFT 11/22/2007  . DEPENDENT EDEMA, LEGS, BILATERAL 08/06/2007  . Obstructive sleep apnea 03/25/2007  . Eczema 02/21/2007  . OBESITY NOS 01/21/2007  . Asthma 01/21/2007   Past Medical History:  Diagnosis Date  . Anxiety    no official dx  . Asthma   . Eczema   . Fibroids   . GONORRHEA 03/25/2008   Qualifier: Diagnosis  of  By: Isla Pence    . Heart murmur   . Heart murmur   . Hypertension   . MRSA (methicillin resistant staph aureus) culture positive 05/2012   cellulits/ left leg  . Obesity     Family History  Problem Relation Age of Onset  . Heart disease Maternal Grandmother   . Diabetes Maternal Grandmother   . Diabetes Father   . Diabetes Mother   . Cancer Maternal Uncle        prostate  . Cancer Paternal Aunt        lung  . Stroke Maternal Grandfather     Past Surgical History:  Procedure Laterality Date  . NO PAST SURGERIES     Social History   Occupational History  . Not on file  Tobacco Use  . Smoking status: Current Every Day Smoker    Packs/day: 0.20    Years: 5.00    Pack years: 1.00    Types: Cigarettes  . Smokeless tobacco: Never Used  Substance and Sexual Activity  . Alcohol use: Yes    Alcohol/week: 0.0 standard drinks    Comment: occ  . Drug use: Yes    Types: Marijuana    Comment: pot-about 1 joint BID  . Sexual activity: Yes    Birth control/protection: Condom

## 2019-08-11 NOTE — Progress Notes (Signed)
Subjective:    Patient ID: Marissa Joseph, female    DOB: 01/30/1981, 39 y.o.   MRN: [id number]  HPI The patient is here for follow up of their chronic medical problems, including hypertension  At her last visit she was resistant to making changes in her BP medications and stated she would monitor her BP at home.  BP has been < 140/90.  The other day she recalls it was 132/86.  She is not currently exercising.  She is trying to work on changes in her diet.       Medications and allergies reviewed with patient and updated if appropriate.  Patient Active Problem List   Diagnosis Date Noted  . Primary osteoarthritis of right knee 07/29/2019  . Acute lateral meniscus tear of right knee 07/29/2019  . Acute medial meniscus tear of right knee 07/29/2019  . Body mass index 50.0-59.9, adult ([place]) 07/29/2019  . Morbid obesity ([place]) 07/29/2019  . Muscular pain 11/13/2018  . Scapular dyskinesis 10/07/2018  . Acute right-sided thoracic back pain 09/25/2018  . Depression 05/10/2018  . Amenorrhea 10/10/2017  . Ventral hernia without obstruction or gangrene 07/13/2017  . Hearing difficulty 11/24/2016  . Prediabetes 09/06/2015  . Essential hypertension, benign 09/06/2015  . Smoking 12/22/2013  . Osteoarthritis of left knee 08/29/2013  . Pap smear of cervix with ASCUS, cannot exclude HGSIL 06/25/2013  . Fibroid uterus 05/22/2013  . Dysmenorrhea 05/22/2013  . Monilial intertrigo 01/06/2013  . ALLERGIC RHINITIS 04/10/2008  . HEEL PAIN, LEFT 11/22/2007  . DEPENDENT EDEMA, LEGS, BILATERAL 08/06/2007  . Obstructive sleep apnea 03/25/2007  . Eczema 02/21/2007  . OBESITY NOS 01/21/2007  . Asthma 01/21/2007    Current Outpatient Medications on File Prior to Visit  Medication Sig Dispense Refill  . albuterol (VENTOLIN HFA) 108 (90 Base) MCG/ACT inhaler Inhale 2 puffs into the lungs every 6 (six) hours as needed for wheezing. 1 Inhaler 8  . budesonide-formoterol (SYMBICORT) 80-4.5  MCG/ACT inhaler Inhale 2 puffs into the lungs 2 (two) times daily. 1 Inhaler 3  . diclofenac Sodium (VOLTAREN) 1 % GEL Apply 4 g topically 4 (four) times daily as needed. 150 g 0  . gabapentin (NEURONTIN) 100 MG capsule Take 2 capsules (200 mg total) by mouth at bedtime. 60 capsule 3  . montelukast (SINGULAIR) 10 MG tablet Take 1 tablet (10 mg total) by mouth at bedtime. 90 tablet 1   No current facility-administered medications on file prior to visit.    Past Medical History:  Diagnosis Date  . Anxiety    no official dx  . Asthma   . Eczema   . Fibroids   . GONORRHEA 03/25/2008   Qualifier: Diagnosis of  By: Isla Pence    . Heart murmur   . Heart murmur   . Hypertension   . MRSA (methicillin resistant staph aureus) culture positive 05/2012   cellulits/ left leg  . Obesity     Past Surgical History:  Procedure Laterality Date  . NO PAST SURGERIES      Social History   Socioeconomic History  . Marital status: Single    Spouse name: Not on file  . Number of children: Not on file  . Years of education: Not on file  . Highest education level: Not on file  Occupational History  . Not on file  Tobacco Use  . Smoking status: Current Every Day Smoker    Packs/day: 0.20    Years: 5.00    Pack years: 1.00  Types: Cigarettes  . Smokeless tobacco: Never Used  Substance and Sexual Activity  . Alcohol use: Yes    Alcohol/week: 0.0 standard drinks    Comment: occ  . Drug use: Yes    Types: Marijuana    Comment: pot-about 1 joint BID  . Sexual activity: Yes    Birth control/protection: Condom  Other Topics Concern  . Not on file  Social History Narrative  . Not on file   Social Determinants of Health   Financial Resource Strain:   . Difficulty of Paying Living Expenses: Not on file  Food Insecurity:   . Worried About Charity fundraiser in the Last Year: Not on file  . Ran Out of Food in the Last Year: Not on file  Transportation Needs:   . Lack of  Transportation (Medical): Not on file  . Lack of Transportation (Non-Medical): Not on file  Physical Activity:   . Days of Exercise per Week: Not on file  . Minutes of Exercise per Session: Not on file  Stress:   . Feeling of Stress : Not on file  Social Connections:   . Frequency of Communication with Friends and Family: Not on file  . Frequency of Social Gatherings with Friends and Family: Not on file  . Attends Religious Services: Not on file  . Active Member of Clubs or Organizations: Not on file  . Attends Archivist Meetings: Not on file  . Marital Status: Not on file    Family History  Problem Relation Age of Onset  . Heart disease Maternal Grandmother   . Diabetes Maternal Grandmother   . Diabetes Father   . Diabetes Mother   . Cancer Maternal Uncle        prostate  . Cancer Paternal Aunt        lung  . Stroke Maternal Grandfather     Review of Systems  Cardiovascular: Negative for chest pain and palpitations.  Neurological: Positive for headaches (mild, occ). Negative for dizziness and light-headedness.       Objective:   Vitals:   08/12/19 0916  BP: 136/90  Pulse: 77  Resp: 18  Temp: 98.2 F (36.8 C)  SpO2: 99%   BP Readings from Last 3 Encounters:  08/12/19 136/90  07/22/19 138/86  07/17/19 (!) 138/91   Wt Readings from Last 3 Encounters:  08/12/19 (!) 306 lb 9.6 oz (139.1 kg)  07/22/19 (!) 304 lb (137.9 kg)  05/14/19 (!) 301 lb 12.8 oz (136.9 kg)   Body mass index is 52.63 kg/m.   Physical Exam    Constitutional: Appears well-developed and well-nourished. No distress.  HENT:  Head: Normocephalic and atraumatic.  Neck: Neck supple. No tracheal deviation present. No thyromegaly present.  No cervical lymphadenopathy Cardiovascular: Normal rate, regular rhythm and normal heart sounds.   No murmur heard. No carotid bruit .  No edema Pulmonary/Chest: Effort normal and breath sounds normal. No respiratory distress. No has no wheezes.  No rales.  Skin: Skin is warm and dry. Not diaphoretic.  Psychiatric: Normal mood and affect. Behavior is normal.      Assessment & Plan:    See Problem List for Assessment and Plan of chronic medical problems.    This visit occurred during the SARS-CoV-2 public health emergency.  Safety protocols were in place, including screening questions prior to the visit, additional usage of staff PPE, and extensive cleaning of exam room while observing appropriate contact time as indicated for disinfecting solutions.

## 2019-08-12 ENCOUNTER — Ambulatory Visit (INDEPENDENT_AMBULATORY_CARE_PROVIDER_SITE_OTHER): Payer: PRIVATE HEALTH INSURANCE | Admitting: Internal Medicine

## 2019-08-12 ENCOUNTER — Other Ambulatory Visit: Payer: Self-pay

## 2019-08-12 ENCOUNTER — Encounter: Payer: Self-pay | Admitting: Internal Medicine

## 2019-08-12 VITALS — BP 136/90 | HR 77 | Temp 98.2°F | Resp 18 | Ht 64.0 in | Wt 306.6 lb

## 2019-08-12 DIAGNOSIS — I10 Essential (primary) hypertension: Secondary | ICD-10-CM | POA: Diagnosis not present

## 2019-08-12 MED ORDER — OLMESARTAN MEDOXOMIL-HCTZ 20-12.5 MG PO TABS: 1.0000 | ORAL_TABLET | Freq: Every day | ORAL | 1 refills | Status: DC

## 2019-08-12 NOTE — Patient Instructions (Addendum)
   Medications reviewed and updated.  Changes include :   Change medication to only losartan-hydrochlorothiazide 20-12.5 mg daily.    Continue to monitor your BP at home       Please followup in 6 months

## 2019-08-12 NOTE — Assessment & Plan Note (Signed)
Chronic Blood pressure not ideally controlled-goal would be less than 130/80 Will increase Benicar to 20 mg daily, but decrease HCTZ to 12.5 mg daily since it is hard for her to get to the bathroom while she is at work Hopefully the combined pill of these medications is covered by her insurance which will make it easier for her to take on a daily basis Advised monitoring her BP at home Discussed the importance of weight loss-healthy diet, decrease portions, increasing activity Follow-up in 6 months

## 2019-10-21 ENCOUNTER — Encounter: Payer: Self-pay | Admitting: Internal Medicine

## 2019-10-22 ENCOUNTER — Encounter: Payer: Self-pay | Admitting: Internal Medicine

## 2019-10-22 ENCOUNTER — Encounter (HOSPITAL_COMMUNITY): Payer: Self-pay | Admitting: Emergency Medicine

## 2019-10-22 ENCOUNTER — Other Ambulatory Visit: Payer: Self-pay

## 2019-10-22 ENCOUNTER — Emergency Department (HOSPITAL_COMMUNITY)
Admission: EM | Admit: 2019-10-22 | Discharge: 2019-10-22 | Disposition: A | Discharge status: Home / Self Care | Payer: PRIVATE HEALTH INSURANCE | Attending: Emergency Medicine | Admitting: Emergency Medicine

## 2019-10-22 DIAGNOSIS — R220 Localized swelling, mass and lump, head: Secondary | ICD-10-CM | POA: Insufficient documentation

## 2019-10-22 DIAGNOSIS — Z5321 Procedure and treatment not carried out due to patient leaving prior to being seen by health care provider: Secondary | ICD-10-CM | POA: Diagnosis not present

## 2019-10-22 DIAGNOSIS — K047 Periapical abscess without sinus: Secondary | ICD-10-CM | POA: Diagnosis not present

## 2019-10-22 NOTE — ED Notes (Signed)
No reply x4 not seen in lobby for some time.

## 2019-10-22 NOTE — ED Triage Notes (Signed)
Patient arrives to ED with complaints of right lower tooth abscess that has been bothering he the last two days. Patient states her dentist prescribed her Vicodin and amoxicillin yesterday. Patient is here today because now the right side of her face has started to swell. Patient in no acute distress.

## 2019-11-19 ENCOUNTER — Encounter: Payer: Self-pay | Admitting: Internal Medicine

## 2019-12-01 ENCOUNTER — Encounter: Payer: Self-pay | Admitting: Internal Medicine

## 2019-12-17 ENCOUNTER — Other Ambulatory Visit: Payer: Self-pay

## 2019-12-17 ENCOUNTER — Encounter (HOSPITAL_COMMUNITY): Payer: Self-pay

## 2019-12-17 ENCOUNTER — Emergency Department (HOSPITAL_COMMUNITY): Payer: Self-pay

## 2019-12-17 ENCOUNTER — Inpatient Hospital Stay (HOSPITAL_COMMUNITY)
Admission: EM | Admit: 2019-12-17 | Discharge: 2019-12-23 | DRG: 872 | Disposition: A | Discharge status: Home / Self Care | Payer: Self-pay | Attending: Internal Medicine | Admitting: Internal Medicine

## 2019-12-17 ENCOUNTER — Telehealth: Payer: Self-pay | Admitting: Internal Medicine

## 2019-12-17 DIAGNOSIS — Z833 Family history of diabetes mellitus: Secondary | ICD-10-CM

## 2019-12-17 DIAGNOSIS — I878 Other specified disorders of veins: Secondary | ICD-10-CM | POA: Diagnosis present

## 2019-12-17 DIAGNOSIS — L03116 Cellulitis of left lower limb: Secondary | ICD-10-CM | POA: Diagnosis present

## 2019-12-17 DIAGNOSIS — M1711 Unilateral primary osteoarthritis, right knee: Secondary | ICD-10-CM | POA: Diagnosis present

## 2019-12-17 DIAGNOSIS — A419 Sepsis, unspecified organism: Principal | ICD-10-CM | POA: Diagnosis present

## 2019-12-17 DIAGNOSIS — Z8249 Family history of ischemic heart disease and other diseases of the circulatory system: Secondary | ICD-10-CM

## 2019-12-17 DIAGNOSIS — J45909 Unspecified asthma, uncomplicated: Secondary | ICD-10-CM | POA: Diagnosis present

## 2019-12-17 DIAGNOSIS — Z8614 Personal history of Methicillin resistant Staphylococcus aureus infection: Secondary | ICD-10-CM

## 2019-12-17 DIAGNOSIS — F3289 Other specified depressive episodes: Secondary | ICD-10-CM

## 2019-12-17 DIAGNOSIS — R7881 Bacteremia: Secondary | ICD-10-CM

## 2019-12-17 DIAGNOSIS — M2391 Unspecified internal derangement of right knee: Secondary | ICD-10-CM | POA: Diagnosis present

## 2019-12-17 DIAGNOSIS — I10 Essential (primary) hypertension: Secondary | ICD-10-CM | POA: Diagnosis present

## 2019-12-17 DIAGNOSIS — L03119 Cellulitis of unspecified part of limb: Secondary | ICD-10-CM | POA: Diagnosis present

## 2019-12-17 DIAGNOSIS — L0390 Cellulitis, unspecified: Secondary | ICD-10-CM

## 2019-12-17 DIAGNOSIS — Z823 Family history of stroke: Secondary | ICD-10-CM

## 2019-12-17 DIAGNOSIS — Z79899 Other long term (current) drug therapy: Secondary | ICD-10-CM

## 2019-12-17 DIAGNOSIS — F329 Major depressive disorder, single episode, unspecified: Secondary | ICD-10-CM | POA: Diagnosis present

## 2019-12-17 DIAGNOSIS — I872 Venous insufficiency (chronic) (peripheral): Secondary | ICD-10-CM | POA: Diagnosis present

## 2019-12-17 DIAGNOSIS — M1712 Unilateral primary osteoarthritis, left knee: Secondary | ICD-10-CM | POA: Diagnosis present

## 2019-12-17 DIAGNOSIS — F17210 Nicotine dependence, cigarettes, uncomplicated: Secondary | ICD-10-CM | POA: Diagnosis present

## 2019-12-17 DIAGNOSIS — Z20822 Contact with and (suspected) exposure to covid-19: Secondary | ICD-10-CM | POA: Diagnosis present

## 2019-12-17 DIAGNOSIS — G4733 Obstructive sleep apnea (adult) (pediatric): Secondary | ICD-10-CM | POA: Diagnosis present

## 2019-12-17 DIAGNOSIS — M170 Bilateral primary osteoarthritis of knee: Secondary | ICD-10-CM | POA: Diagnosis present

## 2019-12-17 DIAGNOSIS — Z6841 Body Mass Index (BMI) 40.0 and over, adult: Secondary | ICD-10-CM

## 2019-12-17 DIAGNOSIS — F4323 Adjustment disorder with mixed anxiety and depressed mood: Secondary | ICD-10-CM | POA: Diagnosis present

## 2019-12-17 HISTORY — DX: Sleep apnea, unspecified: G47.30

## 2019-12-17 LAB — CBC WITH DIFFERENTIAL/PLATELET
Abs Immature Granulocytes: 0.15 10*3/uL — ABNORMAL HIGH (ref 0.00–0.07)
Basophils Absolute: 0 10*3/uL (ref 0.0–0.1)
Basophils Relative: 0 %
Eosinophils Absolute: 0 10*3/uL (ref 0.0–0.5)
Eosinophils Relative: 0 %
HCT: 39 % (ref 36.0–46.0)
Hemoglobin: 13.1 g/dL (ref 12.0–15.0)
Immature Granulocytes: 1 %
Lymphocytes Relative: 3 %
Lymphs Abs: 0.4 10*3/uL — ABNORMAL LOW (ref 0.7–4.0)
MCH: 32.1 pg (ref 26.0–34.0)
MCHC: 33.6 g/dL (ref 30.0–36.0)
MCV: 95.6 fL (ref 80.0–100.0)
Monocytes Absolute: 0.6 10*3/uL (ref 0.1–1.0)
Monocytes Relative: 4 %
Neutro Abs: 11.5 10*3/uL — ABNORMAL HIGH (ref 1.7–7.7)
Neutrophils Relative %: 92 %
Platelets: 213 10*3/uL (ref 150–400)
RBC: 4.08 MIL/uL (ref 3.87–5.11)
RDW: 13.1 % (ref 11.5–15.5)
WBC: 12.7 10*3/uL — ABNORMAL HIGH (ref 4.0–10.5)
nRBC: 0 % (ref 0.0–0.2)

## 2019-12-17 LAB — COMPREHENSIVE METABOLIC PANEL
ALT: 16 U/L (ref 0–44)
AST: 18 U/L (ref 15–41)
Albumin: 3.6 g/dL (ref 3.5–5.0)
Alkaline Phosphatase: 65 U/L (ref 38–126)
Anion gap: 13 (ref 5–15)
BUN: 24 mg/dL — ABNORMAL HIGH (ref 6–20)
CO2: 21 mmol/L — ABNORMAL LOW (ref 22–32)
Calcium: 9.1 mg/dL (ref 8.9–10.3)
Chloride: 101 mmol/L (ref 98–111)
Creatinine, Ser: 0.84 mg/dL (ref 0.44–1.00)
GFR calc Af Amer: >60 mL/min (ref >60)
GFR calc non Af Amer: >60 mL/min (ref >60)
Glucose, Bld: 136 mg/dL — ABNORMAL HIGH (ref 70–99)
Potassium: 3.8 mmol/L (ref 3.5–5.1)
Sodium: 135 mmol/L (ref 135–145)
Total Bilirubin: 0.4 mg/dL (ref 0.3–1.2)
Total Protein: 7.5 g/dL (ref 6.5–8.1)

## 2019-12-17 LAB — URINALYSIS, ROUTINE W REFLEX MICROSCOPIC
Bilirubin Urine: NEGATIVE
Glucose, UA: NEGATIVE mg/dL
Hgb urine dipstick: NEGATIVE
Ketones, ur: NEGATIVE mg/dL
Leukocytes,Ua: NEGATIVE
Nitrite: NEGATIVE
Protein, ur: 30 mg/dL — AB
Specific Gravity, Urine: 1.026 (ref 1.005–1.030)
pH: 6 (ref 5.0–8.0)

## 2019-12-17 LAB — SARS CORONAVIRUS 2 BY RT PCR (HOSPITAL ORDER, PERFORMED IN ~~LOC~~ HOSPITAL LAB): SARS Coronavirus 2: NEGATIVE

## 2019-12-17 LAB — LACTIC ACID, PLASMA
Lactic Acid, Venous: 2.1 mmol/L (ref 0.5–1.9)
Lactic Acid, Venous: 1.5 mmol/L (ref 0.5–1.9)

## 2019-12-17 LAB — I-STAT BETA HCG BLOOD, ED (MC, WL, AP ONLY): I-stat hCG, quantitative: <5 m[IU]/mL (ref <5)

## 2019-12-17 MED ORDER — OLMESARTAN MEDOXOMIL-HCTZ 20-12.5 MG PO TABS: 1.0000 | ORAL_TABLET | Freq: Every day | ORAL | Status: DC

## 2019-12-17 MED ORDER — HYDROMORPHONE HCL 1 MG/ML IJ SOLN
1.0000 mg | Freq: Once | INTRAMUSCULAR | Status: AC
  Administered 2019-12-17: 1 mg via INTRAVENOUS
  Filled 2019-12-17: qty 1

## 2019-12-17 MED ORDER — SODIUM CHLORIDE 0.9 % IV SOLN
2.0000 g | INTRAVENOUS | Status: DC
  Administered 2019-12-17: 2 g via INTRAVENOUS
  Filled 2019-12-17: qty 20

## 2019-12-17 MED ORDER — GABAPENTIN 100 MG PO CAPS
200.0000 mg | ORAL_CAPSULE | Freq: Every day | ORAL | Status: DC
  Administered 2019-12-17 – 2019-12-22 (×6): 200 mg via ORAL
  Filled 2019-12-17 (×6): qty 2

## 2019-12-17 MED ORDER — VANCOMYCIN HCL 2000 MG/400ML IV SOLN
2000.0000 mg | Freq: Once | INTRAVENOUS | Status: AC
  Administered 2019-12-17: 2000 mg via INTRAVENOUS
  Filled 2019-12-17: qty 400

## 2019-12-17 MED ORDER — MORPHINE SULFATE (PF) 2 MG/ML IV SOLN
1.0000 mg | INTRAVENOUS | Status: DC | PRN
  Administered 2019-12-18 – 2019-12-22 (×14): 1 mg via INTRAVENOUS
  Filled 2019-12-17 (×14): qty 1

## 2019-12-17 MED ORDER — ALBUTEROL SULFATE (2.5 MG/3ML) 0.083% IN NEBU
2.5000 mg | INHALATION_SOLUTION | Freq: Four times a day (QID) | RESPIRATORY_TRACT | Status: DC | PRN
  Administered 2019-12-19 – 2019-12-21 (×3): 2.5 mg via RESPIRATORY_TRACT
  Filled 2019-12-17 (×3): qty 3

## 2019-12-17 MED ORDER — IRBESARTAN 150 MG PO TABS
150.0000 mg | ORAL_TABLET | Freq: Every day | ORAL | Status: DC
  Administered 2019-12-17 – 2019-12-23 (×7): 150 mg via ORAL
  Filled 2019-12-17 (×9): qty 1

## 2019-12-17 MED ORDER — SODIUM CHLORIDE 0.9 % IV SOLN
1.0000 g | Freq: Once | INTRAVENOUS | Status: AC
  Administered 2019-12-17: 1 g via INTRAVENOUS
  Filled 2019-12-17: qty 1

## 2019-12-17 MED ORDER — SODIUM CHLORIDE 0.9 % IV BOLUS (SEPSIS)
800.0000 mL | Freq: Once | INTRAVENOUS | Status: AC
  Administered 2019-12-17: 800 mL via INTRAVENOUS

## 2019-12-17 MED ORDER — SODIUM CHLORIDE 0.9% FLUSH: 3.0000 mL | Freq: Once | INTRAVENOUS | Status: DC

## 2019-12-17 MED ORDER — SODIUM CHLORIDE 0.9 % IV SOLN
1.0000 g | Freq: Three times a day (TID) | INTRAVENOUS | Status: DC
  Administered 2019-12-18 (×2): 1 g via INTRAVENOUS
  Filled 2019-12-17 (×4): qty 1

## 2019-12-17 MED ORDER — ALBUTEROL SULFATE HFA 108 (90 BASE) MCG/ACT IN AERS: 2.0000 | INHALATION_SPRAY | Freq: Four times a day (QID) | RESPIRATORY_TRACT | Status: DC | PRN

## 2019-12-17 MED ORDER — ENOXAPARIN SODIUM 80 MG/0.8ML ~~LOC~~ SOLN
70.0000 mg | SUBCUTANEOUS | Status: DC
  Administered 2019-12-17 – 2019-12-22 (×6): 70 mg via SUBCUTANEOUS
  Filled 2019-12-17 (×4): qty 0.8
  Filled 2019-12-17: qty 0.7
  Filled 2019-12-17 (×2): qty 0.8

## 2019-12-17 MED ORDER — VANCOMYCIN HCL IN DEXTROSE 1-5 GM/200ML-% IV SOLN: 1000.0000 mg | Freq: Once | INTRAVENOUS | Status: DC

## 2019-12-17 MED ORDER — ACETAMINOPHEN 325 MG PO TABS
650.0000 mg | ORAL_TABLET | Freq: Once | ORAL | Status: AC | PRN
  Administered 2019-12-17: 650 mg via ORAL
  Filled 2019-12-17: qty 2

## 2019-12-17 MED ORDER — SODIUM CHLORIDE 0.9 % IV SOLN: INTRAVENOUS | Status: DC

## 2019-12-17 MED ORDER — MOMETASONE FURO-FORMOTEROL FUM 100-5 MCG/ACT IN AERO
2.0000 | INHALATION_SPRAY | Freq: Two times a day (BID) | RESPIRATORY_TRACT | Status: DC
  Administered 2019-12-17 – 2019-12-20 (×7): 2 via RESPIRATORY_TRACT
  Filled 2019-12-17: qty 8.8

## 2019-12-17 MED ORDER — VANCOMYCIN HCL 1750 MG/350ML IV SOLN
1750.0000 mg | Freq: Two times a day (BID) | INTRAVENOUS | Status: DC
  Administered 2019-12-18 – 2019-12-19 (×3): 1750 mg via INTRAVENOUS
  Filled 2019-12-17 (×4): qty 350

## 2019-12-17 MED ORDER — HYDROCHLOROTHIAZIDE 12.5 MG PO CAPS
12.5000 mg | ORAL_CAPSULE | Freq: Every day | ORAL | Status: DC
  Administered 2019-12-17 – 2019-12-23 (×7): 12.5 mg via ORAL
  Filled 2019-12-17 (×7): qty 1

## 2019-12-17 MED ORDER — SODIUM CHLORIDE 0.9 % IV BOLUS (SEPSIS)
1000.0000 mL | Freq: Once | INTRAVENOUS | Status: AC
  Administered 2019-12-17: 1000 mL via INTRAVENOUS

## 2019-12-17 MED ORDER — ONDANSETRON HCL 4 MG/2ML IJ SOLN
4.0000 mg | Freq: Once | INTRAMUSCULAR | Status: AC
  Administered 2019-12-17: 4 mg via INTRAVENOUS
  Filled 2019-12-17: qty 2

## 2019-12-17 NOTE — ED Provider Notes (Signed)
[facility] DEPT Provider Note   CSN: [id number] Arrival date & time: 12/17/19  0457     History Chief Complaint  Patient presents with  . Leg Pain    Marissa Joseph is a 39 y.o. female with pertinent past medical history of hypertension presents the emergency department today for left leg pain.  Patient states that she noticed that she started having right leg pain last night, has now severely progressed up into her leg over the past couple of hours.  Also admits to redness and swelling.  Patient has unfortunately been sitting on her waiting room for 9 hours before I was able to see her.  Swelling and pain and has not progressed up to mid calf.  States that this has rapidly progressed along with the pain.  Patient states that this feels like her previous cellulitis.Per chart review has had cellulitis in 2013 , 2015 and then again in 2017.  Has been admitted for her cellulitis previously.  States that she has not taken anything for this.  States that the pain is so bad she has been having to use a wheelchair since last night.  Denies any diabetes.  Denies any bug bites, URI-like symptoms.  Does admit to some fevers and chills.  Denies any shortness of breath or chest pain.  Denies any paresthesias in her legs.  Denies any previous clots, personal history of clotting disorder, recent surgery, chance of pregnancy, recent travel.  ,HPI     Past Medical History:  Diagnosis Date  . Anxiety    no official dx  . Asthma   . Eczema   . Fibroids   . GONORRHEA 03/25/2008   Qualifier: Diagnosis of  By: Isla Pence    . Heart murmur   . Heart murmur   . Hypertension   . MRSA (methicillin resistant staph aureus) culture positive 05/2012   cellulits/ left leg  . Obesity     Patient Active Problem List   Diagnosis Date Noted  . Sepsis due to cellulitis ([place]) 12/17/2019  . Primary osteoarthritis of right knee 07/29/2019  . Acute lateral meniscus tear of  right knee 07/29/2019  . Acute medial meniscus tear of right knee 07/29/2019  . Body mass index 50.0-59.9, adult ([place]) 07/29/2019  . Morbid obesity ([place]) 07/29/2019  . Muscular pain 11/13/2018  . Scapular dyskinesis 10/07/2018  . Acute right-sided thoracic back pain 09/25/2018  . Depression 05/10/2018  . Amenorrhea 10/10/2017  . Ventral hernia without obstruction or gangrene 07/13/2017  . Hearing difficulty 11/24/2016  . Prediabetes 09/06/2015  . Essential hypertension, benign 09/06/2015  . Smoking 12/22/2013  . Osteoarthritis of left knee 08/29/2013  . Pap smear of cervix with ASCUS, cannot exclude HGSIL 06/25/2013  . Fibroid uterus 05/22/2013  . Dysmenorrhea 05/22/2013  . Monilial intertrigo 01/06/2013  . ALLERGIC RHINITIS 04/10/2008  . HEEL PAIN, LEFT 11/22/2007  . DEPENDENT EDEMA, LEGS, BILATERAL 08/06/2007  . Obstructive sleep apnea 03/25/2007  . Eczema 02/21/2007  . OBESITY NOS 01/21/2007  . Asthma 01/21/2007    Past Surgical History:  Procedure Laterality Date  . NO PAST SURGERIES       OB History    Gravida  0   Para      Term      Preterm      AB      Living        SAB      TAB      Ectopic  Multiple      Live Births              Family History  Problem Relation Age of Onset  . Heart disease Maternal Grandmother   . Diabetes Maternal Grandmother   . Diabetes Father   . Diabetes Mother   . Cancer Maternal Uncle        prostate  . Cancer Paternal Aunt        lung  . Stroke Maternal Grandfather     Social History   Tobacco Use  . Smoking status: Current Every Day Smoker    Packs/day: 0.20    Years: 5.00    Pack years: 1.00    Types: Cigarettes  . Smokeless tobacco: Never Used  Vaping Use  . Vaping Use: Some days  Substance Use Topics  . Alcohol use: Yes    Alcohol/week: 0.0 standard drinks    Comment: occ  . Drug use: Yes    Types: Marijuana    Comment: pot-about 1 joint BID    Home Medications Prior to  Admission medications   Medication Sig Start Date End Date Taking? Authorizing Provider  albuterol (VENTOLIN HFA) 108 (90 Base) MCG/ACT inhaler Inhale 2 puffs into the lungs every 6 (six) hours as needed for wheezing. 09/25/18   Binnie Rail, MD  budesonide-formoterol (SYMBICORT) 80-4.5 MCG/ACT inhaler Inhale 2 puffs into the lungs 2 (two) times daily. 05/14/19   Binnie Rail, MD  diclofenac Sodium (VOLTAREN) 1 % GEL Apply 4 g topically 4 (four) times daily as needed. 07/17/19   Lamptey, Myrene Galas, MD  gabapentin (NEURONTIN) 100 MG capsule Take 2 capsules (200 mg total) by mouth at bedtime. 10/07/18   Lyndal Pulley, DO  montelukast (SINGULAIR) 10 MG tablet Take 1 tablet (10 mg total) by mouth at bedtime. 07/17/19   Chase Picket, MD  olmesartan-hydrochlorothiazide (BENICAR HCT) 20-12.5 MG tablet Take 1 tablet by mouth daily. 08/12/19   Binnie Rail, MD    Allergies    Avocado and Shellfish allergy  Review of Systems   Review of Systems  Constitutional: Negative for chills, diaphoresis, fatigue and fever.  HENT: Negative for congestion, sore throat and trouble swallowing.   Eyes: Negative for pain and visual disturbance.  Respiratory: Negative for cough, shortness of breath and wheezing.   Cardiovascular: Negative for chest pain, palpitations and leg swelling.  Gastrointestinal: Negative for abdominal distention, abdominal pain, diarrhea, nausea and vomiting.  Genitourinary: Negative for difficulty urinating.  Musculoskeletal: Positive for arthralgias, joint swelling and myalgias. Negative for back pain, neck pain and neck stiffness.  Skin: Positive for color change. Negative for pallor.  Neurological: Negative for dizziness, speech difficulty, weakness and headaches.  Psychiatric/Behavioral: Negative for confusion.    Physical Exam Updated Vital Signs BP (!) 155/135 (BP Location: Left Arm)   Pulse 100   Temp (!) 100.6 F (38.1 C) (Oral)   Resp 18   Ht 5\' 4"  (1.626 m)   Wt (!)  140.2 kg   SpO2 100%   BMI 53.04 kg/m   Physical Exam Constitutional:      General: She is not in acute distress.    Appearance: Normal appearance. She is not ill-appearing, toxic-appearing or diaphoretic.  HENT:     Mouth/Throat:     Mouth: Mucous membranes are moist.     Pharynx: Oropharynx is clear.  Eyes:     General: No scleral icterus.    Extraocular Movements: Extraocular movements intact.  Pupils: Pupils are equal, round, and reactive to light.  Cardiovascular:     Rate and Rhythm: Normal rate and regular rhythm.     Pulses: Normal pulses.     Heart sounds: Normal heart sounds.  Pulmonary:     Effort: Pulmonary effort is normal. No respiratory distress.     Breath sounds: Normal breath sounds. No stridor. No wheezing, rhonchi or rales.  Chest:     Chest wall: No tenderness.  Abdominal:     General: Abdomen is flat. There is no distension.     Palpations: Abdomen is soft.     Tenderness: There is no abdominal tenderness. There is no guarding or rebound.  Musculoskeletal:        General: No swelling or tenderness. Normal range of motion.     Cervical back: Normal range of motion and neck supple. No rigidity.     Right lower leg: No edema.     Left lower leg: Edema present.  Skin:    General: Skin is warm and dry.     Capillary Refill: Capillary refill takes less than 2 seconds.     Coloration: Skin is not pale.     Findings: Erythema present.     Comments: Warmth and erythema extending up into mid shin.  Extreme tenderness to light touch in this area.  No purulence noted.  Pitting edema on feet bilaterally.  Patient states that this is normal.  No overlying signs of infection past mid shin.  No pitting edema in the knee or thigh.  Right side without any acute abnormalities.  Patient is able to range bilateral legs in all directions.  Normal sensation throughout.  Cap refill less than 2 seconds.  PT and DP pulses 2+ bilaterally  Neurological:     General: No focal  deficit present.     Mental Status: She is alert and oriented to person, place, and time.  Psychiatric:        Mood and Affect: Mood normal.        Behavior: Behavior normal.       ED Results / Procedures / Treatments   Labs (all labs ordered are listed, but only abnormal results are displayed) Labs Reviewed  COMPREHENSIVE METABOLIC PANEL - Abnormal; Notable for the following components:      Result Value   CO2 21 (*)    Glucose, Bld 136 (*)    BUN 24 (*)    All other components within normal limits  CBC WITH DIFFERENTIAL/PLATELET - Abnormal; Notable for the following components:   WBC 12.7 (*)    Neutro Abs 11.5 (*)    Lymphs Abs 0.4 (*)    Abs Immature Granulocytes 0.15 (*)    All other components within normal limits  CULTURE, BLOOD (ROUTINE X 2)  CULTURE, BLOOD (ROUTINE X 2)  URINE CULTURE  LACTIC ACID, PLASMA  LACTIC ACID, PLASMA  URINALYSIS, ROUTINE W REFLEX MICROSCOPIC  I-STAT BETA HCG BLOOD, ED ([facility], [facility], [facility] ONLY)    EKG None  Radiology No results found.  Procedures Procedures (including critical care time)  Medications Ordered in ED Medications  sodium chloride flush (NS) 0.9 % injection 3 mL (3 mLs Intravenous Not Given 12/17/19 1357)  sodium chloride 0.9 % bolus 1,000 mL (1,000 mLs Intravenous New Bag/Given 12/17/19 1437)    And  sodium chloride 0.9 % bolus 800 mL (has no administration in time range)  cefTRIAXone (ROCEPHIN) 2 g in sodium chloride 0.9 % 100 mL IVPB (2 g  Intravenous New Bag/Given (Non-Interop) 12/17/19 1438)  ondansetron (ZOFRAN) injection 4 mg (has no administration in time range)  acetaminophen (TYLENOL) tablet 650 mg (650 mg Oral Given 12/17/19 0829)  HYDROmorphone (DILAUDID) injection 1 mg (1 mg Intravenous Given 12/17/19 1438)    ED Course  I have reviewed the triage vital signs and the nursing notes.  Pertinent labs & imaging results that were available during my care of the patient were reviewed by me and considered in my medical  decision making (see chart for details).    MDM Rules/Calculators/A&P                         Marissa Joseph is a 39 y.o. female with pertinent past medical history of hypertension presents the emergency department today for left leg pain.  Patient with cellulitis which has rapidly been progressing up until mid shin.  Has progressed over the past 8 hours.  Patient has unfortunately been waiting in the waiting room for 9 hours before we are able to assess her.  Temperature 101.3 when she arrived, patient is mildly tachycardic when assessing her today.  650Tylenol was given about 5 hours ago and patient is still febrile with mild tachycardia.  No hypotension.  Cellulitis of left leg, will call code sepsis at this time.  Lactic acid ordered along with blood cultures.  Will then start ceftriaxone, cellulitis is not purulent.  Will call for admission, patient has been admitted before for sepsis secondary to cellulitis.  Will get x-rays her leg at this time.  Vitals:   12/17/19 0812 12/17/19 1312  BP: (!) 147/98 (!) 155/135  Pulse: 98 100  Resp: 20 18  Temp: (!) 101.1 F (38.4 C) (!) 100.6 F (38.1 C)  SpO2: 96% 100%     253 spoke to hospitalist, Dr. Wyline Copas who will admit the patient.  Lactic acid still in process.  X-rays of tibia and fibula still in process.   The patient appears reasonably stabilized for admission considering the current resources, flow, and capabilities available in the ED at this time, and I doubt any other [facility] requiring further screening and/or treatment in the ED prior to admission.   I discussed this case with my attending physician who cosigned this note including patient's presenting symptoms, physical exam, and planned diagnostics and interventions. Attending physician stated agreement with plan or made changes to plan which were implemented.   Attending physician assessed patient at bedside.  Final Clinical Impression(s) / ED Diagnoses Final diagnoses:    Cellulitis of left lower extremity    Rx / DC Orders ED Discharge Orders    None       Alfredia Client, PA-C 12/17/19 1510    Milton Ferguson, MD 12/18/19 [phone]

## 2019-12-17 NOTE — Sepsis Progress Note (Signed)
Notified bedside nurse of need to draw repeat lactic acid when fluid resusitation is complete.

## 2019-12-17 NOTE — Telephone Encounter (Signed)
New message:   Pt's mother is calling and states the pt is currently in the ER and needs some advice on how to handle this situation. Please advise.

## 2019-12-17 NOTE — ED Triage Notes (Signed)
Patient arrived with complaints of lower left pain and left thigh pain that started last night. Reports a history of cellulitis stating this feels the same. No redness noted to leg, but pain and warm to touch.

## 2019-12-17 NOTE — ED Notes (Signed)
Pt was called x3 w/o answer.  Dylan B forgot to document try 2 and 3.

## 2019-12-17 NOTE — Progress Notes (Addendum)
Pharmacy Antibiotic Note  Marissa Joseph is a 39 y.o. female admitted on 12/17/2019 with rapidly spreading nonpurulent RLE cellulitis and fevers. Code sepsis called. Pharmacy has been consulted for vancomycin and meropenem dosing. Xray shows no bone or joint involvement. Lactic acid pending.  Plan:  Vancomycin 2000 mg IV now, then 1750 mg IV q12 hr based on previous vanc dosing/levels  Consider alternative MRSA agent if still needing coverage in 4-5 days  SCr q48 while on vanc  Meropenem 1g IV q8 hr  Height: 5\' 4"  (162.6 cm) Weight: (!) 140.2 kg (309 lb) IBW/kg (Calculated) : 54.7  Temp (24hrs), Avg:101 F (38.3 C), Min:100.6 F (38.1 C), Max:101.3 F (38.5 C)  Recent Labs  Lab 12/17/19 0538 12/17/19 1351  WBC 12.7*  --   CREATININE 0.84  --   LATICACIDVEN  --  2.1*    Estimated Creatinine Clearance: 127.4 mL/min (by C-G formula based on SCr of 0.84 mg/dL).    Allergies  Allergen Reactions  . Avocado Other (See Comments)    Throat itching and swelling  . Shellfish Allergy Hives, Shortness Of Breath and Swelling    Antimicrobials this admission: 7/14 vancomycin >> 7/14 meropenem >>   Dose adjustments this admission:  Microbiology results: 7/14 BCx: sent 7/14 UCx: sent   Thank you for allowing pharmacy to be a part of this patient's care.  Jericho Alcorn A 12/17/2019 4:19 PM

## 2019-12-17 NOTE — ED Notes (Addendum)
Patient moved to hospital bed for comfort due to holding in the ED

## 2019-12-17 NOTE — H&P (Addendum)
History and Physical    Marissa Joseph NID:[id number] DOB: [birth year] DOA: 12/17/2019  PCP: Binnie Rail, MD  Patient coming from: Home  Chief Complaint: Leg pain  HPI: Marissa Joseph is a 39 y.o. female with medical history significant of morbid obesity, recurrent LLE cellulitis who presents to the ED with acute LLE pain and swelling that started the evening prior to visit. 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 No active drainage. No pruritis. Given worsening pain, pt presented to ED for further work up.   Of note, pt was admitted multiple times for recurrent LLE cellulitis years ago, historically treated with keflex as well as doxycycline  ED Course: In the ED, pt noted to be febrile with tmax of 101.33F with tachycardia. WBC noted to be 12.7k. Blood cx ordered and pending. Tib/fib xray ordered in ED with no acute destructive bony lesions noted with diffuse subcutaneous edema, reviewed. Pt was started on broad spectrum abx. Hospitalist consulted for consideration for admission  Review of Systems:  Review of Systems  Constitutional: Positive for fever and malaise/fatigue. Negative for weight loss.  HENT: Negative for nosebleeds, sinus pain and tinnitus.   Eyes: Negative for double vision and discharge.  Respiratory: Negative for hemoptysis, sputum production and shortness of breath.   Cardiovascular: Positive for leg swelling. Negative for chest pain, palpitations and claudication.  Gastrointestinal: Negative for constipation, nausea and vomiting.  Genitourinary: Negative for flank pain, frequency and urgency.  Musculoskeletal: Negative for back pain, falls and neck pain.  Skin:       Warm, swollen area just above L ankle  Neurological: Negative for tingling, tremors, seizures, loss of consciousness and weakness.  Psychiatric/Behavioral: Negative for  hallucinations and memory loss. The patient is not nervous/anxious.     Past Medical History:  Diagnosis Date  . Anxiety    no official dx  . Asthma   . Eczema   . Fibroids   . GONORRHEA 03/25/2008   Qualifier: Diagnosis of  By: Isla Pence    . Heart murmur   . Heart murmur   . Hypertension   . MRSA (methicillin resistant staph aureus) culture positive 05/2012   cellulits/ left leg  . Obesity     Past Surgical History:  Procedure Laterality Date  . NO PAST SURGERIES       reports that she has been smoking cigarettes. She has a 1.00 pack-year smoking history. She has never used smokeless tobacco. She reports current alcohol use. She reports current drug use. Drug: Marijuana.  Allergies  Allergen Reactions  . Avocado Other (See Comments)    Throat itching and swelling  . Shellfish Allergy Hives, Shortness Of Breath and Swelling    Family History  Problem Relation Age of Onset  . Heart disease Maternal Grandmother   . Diabetes Maternal Grandmother   . Diabetes Father   . Diabetes Mother   . Cancer Maternal Uncle        prostate  . Cancer Paternal Aunt        lung  . Stroke Maternal Grandfather     Prior to Admission medications   Medication Sig Start Date End Date Taking? Authorizing Provider  albuterol (VENTOLIN HFA) 108 (90 Base) MCG/ACT inhaler Inhale 2 puffs into the lungs every 6 (six) hours as needed for wheezing. 09/25/18   Binnie Rail, MD  budesonide-formoterol (SYMBICORT) 80-4.5 MCG/ACT inhaler  Inhale 2 puffs into the lungs 2 (two) times daily. 05/14/19   Binnie Rail, MD  diclofenac Sodium (VOLTAREN) 1 % GEL Apply 4 g topically 4 (four) times daily as needed. 07/17/19   Lamptey, Myrene Galas, MD  gabapentin (NEURONTIN) 100 MG capsule Take 2 capsules (200 mg total) by mouth at bedtime. 10/07/18   Lyndal Pulley, DO  montelukast (SINGULAIR) 10 MG tablet Take 1 tablet (10 mg total) by mouth at bedtime. 07/17/19   Chase Picket, MD    olmesartan-hydrochlorothiazide (BENICAR HCT) 20-12.5 MG tablet Take 1 tablet by mouth daily. 08/12/19   Binnie Rail, MD    Physical Exam: Vitals:   12/17/19 0520 12/17/19 0812 12/17/19 1312  BP: (!) 143/81 (!) 147/98 (!) 155/135  Pulse: 88 98 100  Resp: 16 20 18   Temp: (!) 101.3 F (38.5 C) (!) 101.1 F (38.4 C) (!) 100.6 F (38.1 C)  TempSrc: Oral Oral Oral  SpO2: 100% 96% 100%  Weight: (!) 140.2 kg    Height: 5\' 4"  (1.626 m)      Constitutional: NAD, calm, comfortable Vitals:   12/17/19 0520 12/17/19 0812 12/17/19 1312  BP: (!) 143/81 (!) 147/98 (!) 155/135  Pulse: 88 98 100  Resp: 16 20 18   Temp: (!) 101.3 F (38.5 C) (!) 101.1 F (38.4 C) (!) 100.6 F (38.1 C)  TempSrc: Oral Oral Oral  SpO2: 100% 96% 100%  Weight: (!) 140.2 kg    Height: 5\' 4"  (1.626 m)     Eyes: PERRL, lids and conjunctivae normal ENMT: Mucous membranes are moist. Posterior pharynx clear of any exudate or lesions.Normal dentition.  Neck: normal, supple, no masses, no thyromegaly Respiratory: clear to auscultation bilaterally, no wheezing, no crackles. Normal respiratory effort. No accessory muscle use.  Cardiovascular: Regular rate and rhythm, s1, s2 Abdomen: no tenderness, no masses, morbidly obese Musculoskeletal: no clubbing / cyanosis. No joint deformity upper and lower extremities. Good ROM, no contractures. Normal muscle tone.  Skin: patch of tender erythema over LLE just over ankle, no active drainage Neurologic: CN 2-12 grossly intact. Sensation intact,. Strength 5/5 in all 4.  Psychiatric: Normal judgment and insight. Alert and oriented x 3. Normal mood.    Labs on Admission: I have personally reviewed following labs and imaging studies  CBC: Recent Labs  Lab 12/17/19 0538  WBC 12.7*  NEUTROABS 11.5*  HGB 13.1  HCT 39.0  MCV 95.6  PLT 338   Basic Metabolic Panel: Recent Labs  Lab 12/17/19 0538  NA 135  K 3.8  CL 101  CO2 21*  GLUCOSE 136*  BUN 24*  CREATININE 0.84   CALCIUM 9.1   GFR: Estimated Creatinine Clearance: 127.4 mL/min (by C-G formula based on SCr of 0.84 mg/dL). Liver Function Tests: Recent Labs  Lab 12/17/19 0538  AST 18  ALT 16  ALKPHOS 65  BILITOT 0.4  PROT 7.5  ALBUMIN 3.6   No results for input(s): LIPASE, AMYLASE in the last 168 hours. No results for input(s): AMMONIA in the last 168 hours. Coagulation Profile: No results for input(s): INR, PROTIME in the last 168 hours. Cardiac Enzymes: No results for input(s): CKTOTAL, CKMB, CKMBINDEX, TROPONINI in the last 168 hours. BNP (last 3 results) No results for input(s): PROBNP in the last 8760 hours. HbA1C: No results for input(s): HGBA1C in the last 72 hours. CBG: No results for input(s): GLUCAP in the last 168 hours. Lipid Profile: No results for input(s): CHOL, HDL, LDLCALC, TRIG, CHOLHDL, LDLDIRECT in the  last 72 hours. Thyroid Function Tests: No results for input(s): TSH, T4TOTAL, FREET4, T3FREE, THYROIDAB in the last 72 hours. Anemia Panel: No results for input(s): VITAMINB12, FOLATE, FERRITIN, TIBC, IRON, RETICCTPCT in the last 72 hours. Urine analysis:    Component Value Date/Time   COLORURINE YELLOW 04/24/2017 [place] 04/24/2017 1146   LABSPEC 1.029 04/24/2017 1146   PHURINE 5.0 04/24/2017 1146   GLUCOSEU NEGATIVE 04/24/2017 1146   HGBUR MODERATE (A) 04/24/2017 1146   HGBUR negative 03/24/2008 0817   BILIRUBINUR NEGATIVE 04/24/2017 1146   KETONESUR NEGATIVE 04/24/2017 1146   PROTEINUR NEGATIVE 04/24/2017 1146   UROBILINOGEN 1.0 03/23/2013 1435   NITRITE NEGATIVE 04/24/2017 1146   LEUKOCYTESUR SMALL (A) 04/24/2017 1146   Sepsis Labs: !!!!!!!!!!!!!!!!!!!!!!!!!!!!!!!!!!!!!!!!!!!! @LABRCNTIP (procalcitonin:4,lacticidven:4) )No results found for this or any previous visit (from the past 240 hour(s)).   Radiological Exams on Admission: No results found.  EKG: Independently reviewed. Sinus  Assessment/Plan Principal Problem:   Sepsis  due to cellulitis [facility]) Active Problems:   Obstructive sleep apnea   Asthma   Osteoarthritis of left knee   Essential hypertension, benign   Depression   Primary osteoarthritis of right knee   Morbid obesity ([place])   1. LLE cellulitis with sepsis present on admit 1. LLE markedly tender. Chart reviewed, pt has remote hx of recurrent LLE cellulitis in the past 2. Severe nonpurulent cellulitis. Febrile with mild leukocytosis, elevated lactate 3. Cellulitis order set ordered. Will continue pt on vanc, meropenem as per Cellulitis order set 4. Follow blood cx, pending 5. Continue tylenol as needed for fevers 6. Continue analgesics as needed 7. Recheck cbc in AM 2. Asthma 1. On minimal O2 at this time 2. No audible wheezing at this time 3. HTN 1. BP stable 2. Would cont home regimen as tolerated 4. Depression 1. Seems to be stable at this time 5. Morbid obesity 1. Recommend diet and lifestyle modification 6. Hx osteoarthritis 1. Seems to be stable at this time  DVT prophylaxis: Lovenox subq  Code Status: Full Family Communication: Pt in room, pt's boyfriend at bedside Disposition Plan: Uncertain at this time  Consults called:  Admission status: Inpatient as pt would require greater than 2 midnight stay for IV antibiotics and IV fluids given increased mortality related to sepsis   Marylu Lund MD [facility] Pager On Amion  If 7PM-7AM, please contact night-coverage  12/17/2019, 2:56 PM

## 2019-12-18 ENCOUNTER — Inpatient Hospital Stay (HOSPITAL_COMMUNITY): Payer: Self-pay

## 2019-12-18 DIAGNOSIS — R7881 Bacteremia: Secondary | ICD-10-CM

## 2019-12-18 LAB — CBC WITH DIFFERENTIAL/PLATELET
Abs Immature Granulocytes: 0.08 10*3/uL — ABNORMAL HIGH (ref 0.00–0.07)
Basophils Absolute: 0 10*3/uL (ref 0.0–0.1)
Basophils Relative: 0 %
Eosinophils Absolute: 0 10*3/uL (ref 0.0–0.5)
Eosinophils Relative: 0 %
HCT: 37 % (ref 36.0–46.0)
Hemoglobin: 12 g/dL (ref 12.0–15.0)
Immature Granulocytes: 1 %
Lymphocytes Relative: 7 %
Lymphs Abs: 0.8 10*3/uL (ref 0.7–4.0)
MCH: 31 pg (ref 26.0–34.0)
MCHC: 32.4 g/dL (ref 30.0–36.0)
MCV: 95.6 fL (ref 80.0–100.0)
Monocytes Absolute: 0.3 10*3/uL (ref 0.1–1.0)
Monocytes Relative: 3 %
Neutro Abs: 10.3 10*3/uL — ABNORMAL HIGH (ref 1.7–7.7)
Neutrophils Relative %: 89 %
Platelets: 171 10*3/uL (ref 150–400)
RBC: 3.87 MIL/uL (ref 3.87–5.11)
RDW: 13.2 % (ref 11.5–15.5)
WBC: 11.5 10*3/uL — ABNORMAL HIGH (ref 4.0–10.5)
nRBC: 0 % (ref 0.0–0.2)

## 2019-12-18 LAB — BLOOD CULTURE ID PANEL (REFLEXED)

## 2019-12-18 LAB — COMPREHENSIVE METABOLIC PANEL
ALT: 15 U/L (ref 0–44)
AST: 19 U/L (ref 15–41)
Albumin: 2.9 g/dL — ABNORMAL LOW (ref 3.5–5.0)
Alkaline Phosphatase: 50 U/L (ref 38–126)
Anion gap: 12 (ref 5–15)
BUN: 13 mg/dL (ref 6–20)
CO2: 23 mmol/L (ref 22–32)
Calcium: 8.2 mg/dL — ABNORMAL LOW (ref 8.9–10.3)
Chloride: 99 mmol/L (ref 98–111)
Creatinine, Ser: 0.96 mg/dL (ref 0.44–1.00)
GFR calc Af Amer: >60 mL/min (ref >60)
GFR calc non Af Amer: >60 mL/min (ref >60)
Glucose, Bld: 120 mg/dL — ABNORMAL HIGH (ref 70–99)
Potassium: 3.2 mmol/L — ABNORMAL LOW (ref 3.5–5.1)
Sodium: 134 mmol/L — ABNORMAL LOW (ref 135–145)
Total Bilirubin: 0.9 mg/dL (ref 0.3–1.2)
Total Protein: 6.9 g/dL (ref 6.5–8.1)

## 2019-12-18 LAB — ECHOCARDIOGRAM COMPLETE
Area-P 1/2: 3.91 cm2
Height: 64 in
S' Lateral: 2.7 cm
Weight: 4944 oz

## 2019-12-18 LAB — URINE CULTURE: Culture: NO GROWTH

## 2019-12-18 LAB — CREATININE, SERUM
Creatinine, Ser: 0.92 mg/dL (ref 0.44–1.00)
GFR calc Af Amer: >60 mL/min (ref >60)
GFR calc non Af Amer: >60 mL/min (ref >60)

## 2019-12-18 LAB — HIV ANTIBODY (ROUTINE TESTING W REFLEX): HIV Screen 4th Generation wRfx: NONREACTIVE

## 2019-12-18 MED ORDER — SODIUM CHLORIDE 0.9 % IV SOLN
2.0000 g | INTRAVENOUS | Status: DC
  Administered 2019-12-18: 2 g via INTRAVENOUS
  Filled 2019-12-18: qty 20
  Filled 2019-12-18: qty 2

## 2019-12-18 MED ORDER — ACETAMINOPHEN 325 MG PO TABS
650.0000 mg | ORAL_TABLET | Freq: Four times a day (QID) | ORAL | Status: AC | PRN
  Administered 2019-12-18: 650 mg via ORAL
  Filled 2019-12-18: qty 2

## 2019-12-18 MED ORDER — ACETAMINOPHEN 325 MG PO TABS
650.0000 mg | ORAL_TABLET | Freq: Once | ORAL | Status: AC | PRN
  Administered 2019-12-18: 650 mg via ORAL
  Filled 2019-12-18: qty 2

## 2019-12-18 MED ORDER — IOHEXOL 300 MG/ML  SOLN
100.0000 mL | Freq: Once | INTRAMUSCULAR | Status: AC | PRN
  Administered 2019-12-18: 100 mL via INTRAVENOUS

## 2019-12-18 NOTE — Progress Notes (Signed)
°  Echocardiogram 2D Echocardiogram has been performed.  Randa Lynn Miraj Truss 12/18/2019, 3:24 PM

## 2019-12-18 NOTE — Progress Notes (Signed)
PROGRESS NOTE  CHELCY BOLDA SJG:[id number] DOB: 13-Dec-1980 DOA: 12/17/2019 PCP: Binnie Rail, MD  Brief History   Marissa Joseph is a 39 y.o. female with medical history significant of morbid obesity, recurrent LLE cellulitis who presents to the ED with acute LLE pain and swelling that started the evening prior to visit. 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 No active drainage. No pruritis. Given worsening pain, pt presented to ED for further work up.   Of note, pt was admitted multiple times for recurrent LLE cellulitis years ago, historically treated with keflex as well as doxycycline  ED Course: In the ED, pt noted to be febrile with tmax of 101.49F with tachycardia. WBC noted to be 12.7k. Blood cx ordered and pending. Tib/fib xray ordered in ED with no acute destructive bony lesions noted with diffuse subcutaneous edema, reviewed. Pt was started on broad spectrum abx. Hospitalist consulted for consideration for admission.  [facility] were consulted to admit the patient for further evaluation and care.   The patient was admitted to a telemetry bed. She was started on IV Merrem and vancomycin. A blood cultures was obtained and demonstrated growth of staph species that is described as coagulase negative staph with oxacillin resistance. Blood cultures have been repeated and antibiotics have been narrowed to Vancomycin and rocephin. TTE was obtained and demonstrated possible vegetations on the tricuspid valve. TEE has been ordered to confirm.  CT of the tib/fib and ankle has been ordered to rule out abscess.  Consultants  . None  Procedures  . None  Antibiotics   Anti-infectives (From admission, onward)   Start     Dose/Rate Route Frequency Ordered Stop   12/18/19 1800  cefTRIAXone (ROCEPHIN) 2 g in sodium chloride 0.9 % 100 mL IVPB      Discontinue     2 g 200 mL/hr over 30 Minutes Intravenous Every 24 hours 12/18/19 1404     12/18/19 0600  vancomycin (VANCOREADY) IVPB 1750 mg/350 mL     Discontinue     1,750 mg 175 mL/hr over 120 Minutes Intravenous Every 12 hours 12/17/19 1737     12/17/19 2300  meropenem (MERREM) 1 g in sodium chloride 0.9 % 100 mL IVPB  Status:  Discontinued        1 g 200 mL/hr over 30 Minutes Intravenous Every 8 hours 12/17/19 1721 12/18/19 1404   12/17/19 1800  vancomycin (VANCOCIN) IVPB 1000 mg/200 mL premix  Status:  Discontinued        1,000 mg 200 mL/hr over 60 Minutes Intravenous  Once 12/17/19 1537 12/17/19 1644   12/17/19 1800  vancomycin (VANCOREADY) IVPB 2000 mg/400 mL        2,000 mg 200 mL/hr over 120 Minutes Intravenous  Once 12/17/19 1644 12/17/19 2255   12/17/19 1630  vancomycin (VANCOCIN) IVPB 1000 mg/200 mL premix  Status:  Discontinued        1,000 mg 200 mL/hr over 60 Minutes Intravenous  Once 12/17/19 1537 12/17/19 1644   12/17/19 1600  meropenem (MERREM) 1 g in sodium chloride 0.9 % 100 mL IVPB        1 g 200 mL/hr over 30 Minutes Intravenous  Once 12/17/19 1537 12/17/19 1855   12/17/19 1400  cefTRIAXone (ROCEPHIN) 2 g in sodium chloride 0.9 % 100 mL IVPB  Status:  Discontinued        2  g 200 mL/hr over 30 Minutes Intravenous Every 24 hours 12/17/19 1354 12/17/19 1537    .  Subjective  The patient is resting comfortably. She states that she is feeling better, although she is having some pain in her upper back.  Objective   Vitals:  Vitals:   12/18/19 1403 12/18/19 1538  BP: 134/72   Pulse: 96   Resp: 20   Temp: (!) 100.8 F (38.2 C) (!) 100.4 F (38 C)  SpO2: 95%   Exam:  Constitutional:  . The patient is awake, alert, and oriented x 3. No acute distress. Respiratory:  . No increased work of breathing. . No wheezes, rales, or rhonchi . No tactile fremitus Cardiovascular:  . Regular rate and rhythm . No murmurs, ectopy, or gallups. . No lateral PMI. No  thrills. Abdomen:  . Abdomen is soft, non-tender, non-distended . Morbidly obese . Unable to evaluate the abdomen for hernias, masses, or organomegaly due to body habitus . Hypoactive bowel sounds.  Musculoskeletal:  . No cyanosis, clubbing, or edema of the right lower extremity. . Left lower extremity is swollen and warm. It is tender to touch, although the patient states that it is much better than previous. Feels particularly indurated posteriorly. Skin:  . No rashes, lesions, ulcers . palpation of skin: no induration or nodules . See above for abnormalities of left lower extremities. Neurologic:  . CN 2-12 intact . Sensation all 4 extremities intact Psychiatric:  . Mental status o Mood, affect appropriate o Orientation to person, place, time  . judgment and insight appear intact  I have personally reviewed the following:   Today's Data  . Vitals, BMP, CBC  Micro Data  . Blood culture 1/1 positive for coag negative staph that it oxacillin resistant. Would feel that this was a contaminant, although abnormal echocardiogram raises questions.  Imaging  . CT left Tib/fib/ankle - pending.  Cardiology Data  . TTE: Concern for vegetation of tricuspid valve.  Scheduled Meds: . enoxaparin (LOVENOX) injection  70 mg Subcutaneous Q24H  . gabapentin  200 mg Oral QHS  . irbesartan  150 mg Oral Daily   And  . hydrochlorothiazide  12.5 mg Oral Daily  . mometasone-formoterol  2 puff Inhalation BID  . sodium chloride flush  3 mL Intravenous Once   Continuous Infusions: . sodium chloride 100 mL/hr at 12/18/19 1704  . cefTRIAXone (ROCEPHIN)  IV 2 g (12/18/19 1705)  . vancomycin 1,750 mg (12/18/19 1738)    Principal Problem:   Sepsis due to cellulitis [facility]) Active Problems:   Obstructive sleep apnea   Asthma   Osteoarthritis of left knee   Essential hypertension, benign   Depression   Primary osteoarthritis of right knee   Morbid obesity ([place])   LOS: 1 day   A & P  LLE  cellulitis with sepsis present on admit: The patient received IV merrem and vanc initially. This has been changed to IV Rocephin and Vancomycin following results of growth of GPC in 1/1 blood culture. Pt states that there is improvement in the limb today, although it appears acutely infected to me. 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   Bacteremia: 1/1 blood cultures obtained on admission have grown out Coagulase negative staph. Ordinarily this would be dismissed as a contaminant. However, before the ID was made, the TTE was returned with concern for tricuspid vegetation. TEE has been ordered to clarify. Will consult ID in the am.  Also consider CT of the Thoracic spine as the patient was complaining of pain there this morning.  Asthma: Noted and stable. Beta agonists are available on an as needed basis.  HTN: The patient is normotensive on her home doses of avapro and HCTA. Monitor.  Depression: Noted.   Morbid obesity: Complicates all cares. Recommend diet and lifestyle modification as guided by her PCP.  Hx osteoarthritis: Noted and stable.  DVT prophylaxis: Lovenox subq  Code Status: Full Family Communication: Pt in room, pt's boyfriend at bedside Disposition Plan: The patient is from home. Anticipate discharge to home. Barriers to discharge: Need to clarify significance of blood culture with TEE, repeat cultures, and ID consult. Need for IV antibiotics and to rule out abscess in the left lower extremity.  Status is: Inpatient  Remains inpatient appropriate because:Ongoing diagnostic testing needed not appropriate for outpatient work up, IV treatments appropriate due to intensity of illness or inability to take PO and Inpatient level of care appropriate due to severity of illness   Dispo: The patient is from: Home              Anticipated d/c is to: Home              Anticipated d/c date is: 2 days              Patient currently is not  medically stable to d/c.  Josefina Rynders, DO [facility] Direct contact: see [email or web address]  7PM-7AM contact night coverage as above 12/18/2019, 6:17 PM  LOS: 1 day

## 2019-12-18 NOTE — Progress Notes (Signed)
PHARMACY - PHYSICIAN COMMUNICATION CRITICAL VALUE ALERT - BLOOD CULTURE IDENTIFICATION (BCID)  Marissa Joseph is an 39 y.o. female with hx recurrent LLE cellulitis who presented to [facility] on 12/17/2019 with c/o leg pain.  She was started on vancomycin and meropenem on admission for cellulitis.  Name of physician (or Provider) Contacted: Dr. Benny Lennert  Current antibiotics: vancomycin and meropenem  Changes to prescribed antibiotics recommended:  - continue with vancomycin  Results for orders placed or performed during the hospital encounter of 12/17/19  Blood Culture ID Panel (Reflexed) (Collected: 12/17/2019  2:25 PM)  Result Value Ref Range   Enterococcus species NOT DETECTED NOT DETECTED   Listeria monocytogenes NOT DETECTED NOT DETECTED   Staphylococcus species DETECTED (A) NOT DETECTED   Staphylococcus aureus (BCID) NOT DETECTED NOT DETECTED   Methicillin resistance DETECTED (A) NOT DETECTED   Streptococcus species NOT DETECTED NOT DETECTED   Streptococcus agalactiae NOT DETECTED NOT DETECTED   Streptococcus pneumoniae NOT DETECTED NOT DETECTED   Streptococcus pyogenes NOT DETECTED NOT DETECTED   Acinetobacter baumannii NOT DETECTED NOT DETECTED   Enterobacteriaceae species NOT DETECTED NOT DETECTED   Enterobacter cloacae complex NOT DETECTED NOT DETECTED   Escherichia coli NOT DETECTED NOT DETECTED   Klebsiella oxytoca NOT DETECTED NOT DETECTED   Klebsiella pneumoniae NOT DETECTED NOT DETECTED   Proteus species NOT DETECTED NOT DETECTED   Serratia marcescens NOT DETECTED NOT DETECTED   Haemophilus influenzae NOT DETECTED NOT DETECTED   Neisseria meningitidis NOT DETECTED NOT DETECTED   Pseudomonas aeruginosa NOT DETECTED NOT DETECTED   Candida albicans NOT DETECTED NOT DETECTED   Candida glabrata NOT DETECTED NOT DETECTED   Candida krusei NOT DETECTED NOT DETECTED   Candida parapsilosis NOT DETECTED NOT DETECTED   Candida tropicalis NOT DETECTED NOT DETECTED     Dia Sitter P 12/18/2019  1:56 PM

## 2019-12-19 DIAGNOSIS — R590 Localized enlarged lymph nodes: Secondary | ICD-10-CM

## 2019-12-19 DIAGNOSIS — M1711 Unilateral primary osteoarthritis, right knee: Secondary | ICD-10-CM

## 2019-12-19 DIAGNOSIS — L03116 Cellulitis of left lower limb: Secondary | ICD-10-CM

## 2019-12-19 DIAGNOSIS — A419 Sepsis, unspecified organism: Principal | ICD-10-CM

## 2019-12-19 LAB — COMPREHENSIVE METABOLIC PANEL
ALT: 20 U/L (ref 0–44)
AST: 21 U/L (ref 15–41)
Albumin: 2.9 g/dL — ABNORMAL LOW (ref 3.5–5.0)
Alkaline Phosphatase: 47 U/L (ref 38–126)
Anion gap: 9 (ref 5–15)
BUN: 9 mg/dL (ref 6–20)
CO2: 23 mmol/L (ref 22–32)
Calcium: 7.9 mg/dL — ABNORMAL LOW (ref 8.9–10.3)
Chloride: 102 mmol/L (ref 98–111)
Creatinine, Ser: 0.71 mg/dL (ref 0.44–1.00)
GFR calc Af Amer: >60 mL/min (ref >60)
GFR calc non Af Amer: >60 mL/min (ref >60)
Glucose, Bld: 116 mg/dL — ABNORMAL HIGH (ref 70–99)
Potassium: 3 mmol/L — ABNORMAL LOW (ref 3.5–5.1)
Sodium: 134 mmol/L — ABNORMAL LOW (ref 135–145)
Total Bilirubin: 0.6 mg/dL (ref 0.3–1.2)
Total Protein: 6.7 g/dL (ref 6.5–8.1)

## 2019-12-19 LAB — CBC WITH DIFFERENTIAL/PLATELET
Abs Immature Granulocytes: 0.06 10*3/uL (ref 0.00–0.07)
Basophils Absolute: 0 10*3/uL (ref 0.0–0.1)
Basophils Relative: 0 %
Eosinophils Absolute: 0 10*3/uL (ref 0.0–0.5)
Eosinophils Relative: 0 %
HCT: 33.2 % — ABNORMAL LOW (ref 36.0–46.0)
Hemoglobin: 11 g/dL — ABNORMAL LOW (ref 12.0–15.0)
Immature Granulocytes: 1 %
Lymphocytes Relative: 11 %
Lymphs Abs: 1.1 10*3/uL (ref 0.7–4.0)
MCH: 30.9 pg (ref 26.0–34.0)
MCHC: 33.1 g/dL (ref 30.0–36.0)
MCV: 93.3 fL (ref 80.0–100.0)
Monocytes Absolute: 0.5 10*3/uL (ref 0.1–1.0)
Monocytes Relative: 4 %
Neutro Abs: 9 10*3/uL — ABNORMAL HIGH (ref 1.7–7.7)
Neutrophils Relative %: 84 %
Platelets: 159 10*3/uL (ref 150–400)
RBC: 3.56 MIL/uL — ABNORMAL LOW (ref 3.87–5.11)
RDW: 13.1 % (ref 11.5–15.5)
WBC: 10.6 10*3/uL — ABNORMAL HIGH (ref 4.0–10.5)
nRBC: 0 % (ref 0.0–0.2)

## 2019-12-19 MED ORDER — CEFAZOLIN SODIUM-DEXTROSE 2-4 GM/100ML-% IV SOLN
2.0000 g | Freq: Three times a day (TID) | INTRAVENOUS | Status: DC
  Administered 2019-12-19 – 2019-12-22 (×9): 2 g via INTRAVENOUS
  Filled 2019-12-19 (×10): qty 100

## 2019-12-19 MED ORDER — ACETAMINOPHEN 325 MG PO TABS
650.0000 mg | ORAL_TABLET | Freq: Four times a day (QID) | ORAL | Status: DC | PRN
  Administered 2019-12-19 – 2019-12-21 (×4): 650 mg via ORAL
  Filled 2019-12-19 (×4): qty 2

## 2019-12-19 MED ORDER — TRAMADOL HCL 50 MG PO TABS
50.0000 mg | ORAL_TABLET | Freq: Four times a day (QID) | ORAL | Status: DC | PRN
  Administered 2019-12-19: 50 mg via ORAL
  Administered 2019-12-20 – 2019-12-22 (×5): 100 mg via ORAL
  Filled 2019-12-19: qty 1
  Filled 2019-12-19 (×5): qty 2

## 2019-12-19 MED ORDER — SODIUM CHLORIDE 0.9 % IV BOLUS
500.0000 mL | Freq: Once | INTRAVENOUS | Status: AC
  Administered 2019-12-19: 500 mL via INTRAVENOUS

## 2019-12-19 MED ORDER — POTASSIUM CHLORIDE 10 MEQ/100ML IV SOLN
10.0000 meq | INTRAVENOUS | Status: AC
  Administered 2019-12-19 (×4): 10 meq via INTRAVENOUS
  Filled 2019-12-19 (×4): qty 100

## 2019-12-19 NOTE — Plan of Care (Signed)
  Problem: Education: Goal: Knowledge of General Education information will improve Description: Including pain rating scale, medication(s)/side effects and non-pharmacologic comfort measures Outcome: Completed/Met   Problem: Coping: Goal: Level of anxiety will decrease Outcome: Completed/Met   Problem: Elimination: Goal: Will not experience complications related to urinary retention Outcome: Completed/Met

## 2019-12-19 NOTE — Progress Notes (Addendum)
    [facility] has been requested to perform a transesophageal echocardiogram on 12/22/19 for bacteremia.  After careful review of history and examination, the risks and benefits of transesophageal echocardiogram have been explained including risks of esophageal damage, perforation (1:10,000 risk), bleeding, pharyngeal hematoma as well as other potential complications associated with conscious sedation including aspiration, arrhythmia, respiratory failure and death. Alternatives to treatment were discussed, questions were answered. Patient is willing to proceed.   TEE scheduled for 12/22/19 at 11:30am with Dr. Gardiner Rhyme.  Roby Lofts, PA-C 12/19/2019 5:12 PM

## 2019-12-19 NOTE — Progress Notes (Signed)
PROGRESS NOTE  Marissa Joseph [id number]:[id number] DOB: [birth year] DOA: 12/17/2019 PCP: Binnie Rail, MD  Brief History   SYDELL PROWELL is a 39 y.o. female with medical history significant of morbid obesity, recurrent LLE cellulitis who presents to the ED with acute LLE pain and swelling that started the evening prior to visit. 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 No active drainage. No pruritis. Given worsening pain, pt presented to ED for further work up.   Of note, pt was admitted multiple times for recurrent LLE cellulitis years ago, historically treated with keflex as well as doxycycline  ED Course: In the ED, pt noted to be febrile with tmax of 101.70F with tachycardia. WBC noted to be 12.7k. Blood cx ordered and pending. Tib/fib xray ordered in ED with no acute destructive bony lesions noted with diffuse subcutaneous edema, reviewed. Pt was started on broad spectrum abx. Hospitalist consulted for consideration for admission.  [facility] were consulted to admit the patient for further evaluation and care.   The patient was admitted to a telemetry bed. She was started on IV Merrem and vancomycin. A blood cultures was obtained and demonstrated growth of staph species that is described as coagulase negative staph with oxacillin resistance. Blood cultures have been repeated and antibiotics have been narrowed to Vancomycin and rocephin. TTE was obtained and demonstrated possible vegetations on the tricuspid valve. TEE has been ordered to confirm.  CT of the tib/fib and ankle has ruled out abscess.  Consultants  . Infectious disease  Procedures  . None  Antibiotics   Anti-infectives (From admission, onward)   Start     Dose/Rate Route Frequency Ordered Stop   12/18/19 1800  cefTRIAXone (ROCEPHIN) 2 g in sodium chloride 0.9 % 100 mL IVPB      Discontinue     2 g 200 mL/hr over 30 Minutes Intravenous Every 24 hours 12/18/19 1404     12/18/19 0600  vancomycin (VANCOREADY) IVPB 1750 mg/350 mL     Discontinue     1,750 mg 175 mL/hr over 120 Minutes Intravenous Every 12 hours 12/17/19 1737     12/17/19 2300  meropenem (MERREM) 1 g in sodium chloride 0.9 % 100 mL IVPB  Status:  Discontinued        1 g 200 mL/hr over 30 Minutes Intravenous Every 8 hours 12/17/19 1721 12/18/19 1404   12/17/19 1800  vancomycin (VANCOCIN) IVPB 1000 mg/200 mL premix  Status:  Discontinued        1,000 mg 200 mL/hr over 60 Minutes Intravenous  Once 12/17/19 1537 12/17/19 1644   12/17/19 1800  vancomycin (VANCOREADY) IVPB 2000 mg/400 mL        2,000 mg 200 mL/hr over 120 Minutes Intravenous  Once 12/17/19 1644 12/17/19 2255   12/17/19 1630  vancomycin (VANCOCIN) IVPB 1000 mg/200 mL premix  Status:  Discontinued        1,000 mg 200 mL/hr over 60 Minutes Intravenous  Once 12/17/19 1537 12/17/19 1644   12/17/19 1600  meropenem (MERREM) 1 g in sodium chloride 0.9 % 100 mL IVPB        1 g 200 mL/hr over 30 Minutes Intravenous  Once 12/17/19 1537 12/17/19 1855   12/17/19 1400  cefTRIAXone (ROCEPHIN) 2 g in sodium chloride 0.9 % 100 mL IVPB  Status:  Discontinued        2 g 200  mL/hr over 30 Minutes Intravenous Every 24 hours 12/17/19 1354 12/17/19 1537     Subjective  The patient is resting comfortably. She continues to complain of back pain.  Objective   Vitals:  Vitals:   12/19/19 1336 12/19/19 1336  BP: (!) 147/98 (!) 147/98  Pulse: 97 96  Resp: 17 17  Temp: 100.2 F (37.9 C) 100.2 F (37.9 C)  SpO2: 98% 98%  Exam:  Constitutional:  . The patient is awake, alert, and oriented x 3. No acute distress. Respiratory:  . No increased work of breathing. . No wheezes, rales, or rhonchi . No tactile fremitus Cardiovascular:  . Regular rate and rhythm . No murmurs, ectopy, or gallups. . No lateral PMI. No thrills. Abdomen:  . Abdomen is soft,  non-tender, non-distended . Morbidly obese . Unable to evaluate the abdomen for hernias, masses, or organomegaly due to body habitus . Hypoactive bowel sounds.  Musculoskeletal:  . No cyanosis, clubbing, or edema of the right lower extremity. . Left lower extremity is swollen and warm. It remains tender to touch, although the patient states that it is much better than previous. Feels indurated posteriorly. Skin:  . No rashes, lesions, ulcers . palpation of skin: no induration or nodules . See above for abnormalities of left lower extremities. Neurologic:  . CN 2-12 intact . Sensation all 4 extremities intact Psychiatric:  . Mental status o Mood, affect appropriate o Orientation to person, place, time  . judgment and insight appear intact  I have personally reviewed the following:   Today's Data  . Vitals, BMP, CBC  Micro Data  . Blood culture 1/1 positive for coag negative staph that it oxacillin resistant. Would feel that this was a contaminant, although abnormal echocardiogram raises questions. TEE ordered.  Imaging  . CT left Tib/fib/ankle - rules out abscess.  Cardiology Data  . TTE: Concern for vegetation of tricuspid valve. . TEE: Pending  Scheduled Meds: . enoxaparin (LOVENOX) injection  70 mg Subcutaneous Q24H  . gabapentin  200 mg Oral QHS  . irbesartan  150 mg Oral Daily   And  . hydrochlorothiazide  12.5 mg Oral Daily  . mometasone-formoterol  2 puff Inhalation BID  . sodium chloride flush  3 mL Intravenous Once   Continuous Infusions: . sodium chloride 100 mL/hr at 12/19/19 1155  . cefTRIAXone (ROCEPHIN)  IV 2 g (12/18/19 1705)  . vancomycin 1,750 mg (12/19/19 0500)    Principal Problem:   Sepsis due to cellulitis [facility]) Active Problems:   Obstructive sleep apnea   Asthma   Osteoarthritis of left knee   Essential hypertension, benign   Depression   Primary osteoarthritis of right knee   Morbid obesity ([place])   LOS: 2 days   A & P  LLE  cellulitis with sepsis present on admit: The patient received IV merrem and vanc initially. This has been changed to IV Rocephin and Vancomycin following results of growth of GPC in 1/1 blood culture. Pt states that there is improvement in the limb today, although it appears acutely infected to me. 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 CT of tib/fib and ankle has ruled out abscess. Will add lasix to decrease edema seen on CT.  Bacteremia: 1/1 blood cultures obtained on admission have grown out Coagulase negative staph. Ordinarily this would be dismissed as a contaminant. However, before the ID was made, the TTE was returned with concern for tricuspid vegetation. TEE has  been ordered to clarify. Will consult ID in the am. Also consider CT of the Thoracic spine as the patient was complaining of pain there this morning. Infectious disease has been consulted.  Asthma: Noted and stable. Beta agonists are available on an as needed basis.  HTN: The patient is normotensive on her home doses of avapro and HCTA. Monitor.  Depression: Noted.   Morbid obesity: Complicates all cares. Recommend diet and lifestyle modification as guided by her PCP.  Hx osteoarthritis: Noted and stable.  I have seen and examined this patient myself. I have spent 35 minutes in her evaluation and care.  DVT prophylaxis: Lovenox subq  Code Status: Full Family Communication: Pt in room, pt's boyfriend at bedside Disposition Plan: The patient is from home. Anticipate discharge to home. Barriers to discharge: Need to clarify significance of blood culture with TEE, repeat cultures, and ID consult. Need for IV antibiotics and to rule out abscess in the left lower extremity.  Status is: Inpatient  Remains inpatient appropriate because:Ongoing diagnostic testing needed not appropriate for outpatient work up, IV treatments appropriate due to intensity of illness or inability to take PO  and Inpatient level of care appropriate due to severity of illness   Dispo: The patient is from: Home              Anticipated d/c is to: Home              Anticipated d/c date is: 2 days              Patient currently is not medically stable to d/c.  Ariston Grandison, DO [facility] Direct contact: see [email or web address]  7PM-7AM contact night coverage as above 12/19/2019, 4:51 PM  LOS: 1 day

## 2019-12-19 NOTE — Consult Note (Signed)
[facility]  Total days of antibiotics2  Reason for Consult:cellulitis    Referring Physician: swayze  Principal Problem:   Sepsis due to cellulitis [facility]) Active Problems:   Obstructive sleep apnea   Asthma   Osteoarthritis of left knee   Essential hypertension, benign   Depression   Primary osteoarthritis of right knee   Morbid obesity (HCC)    HPI: Marissa Joseph is a 39 y.o. female with hx of BMI 53, OA of right knee, HTN, recurrent cellulitis who was admitted on 7/15 for new onset left leg pain with swelling and erythema. She denies trauma, cuts, insect bites. Associated with fevers, and chills. No nausea/vomiting but has loose stools. Now injury from working at warehouse, did sustain a scrape roughly 2 weeks ago. She noticed very quick onset of pain swelling, warmth and exquisite tenderness to touch to her left ankle. She also noticed inguinal LN swelling. On admit, had temp of 101F, and leukocytosis of 12.7K. started on vancomycin and meropenem. Imaging of leg did not show any abscess. Blood cx shows 1 set that has both staph hominis, staph epi. Due to report of MRSA infections in the past, she had TTE that showed possible TV vegetation. Since admit, tenderness is improved.   She has hx of right knee OA and supposed to get R TKA   Past Medical History:  Diagnosis Date  . Anxiety    no official dx  . Asthma   . Eczema   . Fibroids   . GONORRHEA 03/25/2008   Qualifier: Diagnosis of  By: Isla Pence    . Heart murmur   . Heart murmur   . Hypertension   . MRSA (methicillin resistant staph aureus) culture positive 05/2012   cellulits/ left leg  . Obesity     Allergies:  Allergies  Allergen Reactions  . Avocado Other (See Comments)    Throat itching and swelling  . Shellfish Allergy Hives, Shortness Of Breath and Swelling    MEDICATIONS: . enoxaparin (LOVENOX) injection  70 mg Subcutaneous Q24H  . gabapentin  200 mg Oral QHS    . irbesartan  150 mg Oral Daily   And  . hydrochlorothiazide  12.5 mg Oral Daily  . mometasone-formoterol  2 puff Inhalation BID  . sodium chloride flush  3 mL Intravenous Once    Social History   Tobacco Use  . Smoking status: Current Every Day Smoker    Packs/day: 0.20    Years: 5.00    Pack years: 1.00    Types: Cigarettes  . Smokeless tobacco: Never Used  Vaping Use  . Vaping Use: Some days  Substance Use Topics  . Alcohol use: Yes    Alcohol/week: 0.0 standard drinks    Comment: occ  . Drug use: Yes    Types: Marijuana    Comment: pot-about 1 joint BID    Family History  Problem Relation Age of Onset  . Heart disease Maternal Grandmother   . Diabetes Maternal Grandmother   . Diabetes Father   . Diabetes Mother   . Cancer Maternal Uncle        prostate  . Cancer Paternal Aunt        lung  . Stroke Maternal Grandfather      Review of Systems  Constitutional: Negative for fever, chills, diaphoresis, activity change, appetite change, fatigue and unexpected weight change.  HENT: Negative for congestion, sore throat, rhinorrhea, sneezing, trouble swallowing and sinus pressure.  Eyes:  Negative for photophobia and visual disturbance.  Respiratory: Negative for cough, chest tightness, shortness of breath, wheezing and stridor.  Cardiovascular: Negative for chest pain, palpitations and leg swelling.  Gastrointestinal: Negative for nausea, vomiting, abdominal pain, diarrhea, constipation, blood in stool, abdominal distention and anal bleeding.  Genitourinary: Negative for dysuria, hematuria, flank pain and difficulty urinating.  Musculoskeletal: Negative for myalgias, back pain, joint swelling, arthralgias and gait problem.  Skin: left leg pain and swelling and erythema. Negative for color change, pallor, rash and wound.  Neurological: Negative for dizziness, tremors, weakness and light-headedness.  Hematological: Negative for adenopathy. Does not bruise/bleed easily.   Psychiatric/Behavioral: Negative for behavioral problems, confusion, sleep disturbance, dysphoric mood, decreased concentration and agitation.     OBJECTIVE: Temp:  [100.1 F (37.8 C)-100.8 F (38.2 C)] 100.2 F (37.9 C) (07/16 1336) Pulse Rate:  [93-103] 96 (07/16 1336) Resp:  [17-21] 17 (07/16 1336) BP: (140-148)/(86-98) 147/98 (07/16 1336) SpO2:  [96 %-98 %] 98 % (07/16 1336) Physical Exam  Constitutional:  oriented to person, place, and time. appears well-developed and well-nourished. No distress.  HENT: [place]/AT, PERRLA, no scleral icterus Mouth/Throat: Oropharynx is clear and moist. No oropharyngeal exudate.  Cardiovascular: Normal rate, regular rhythm and normal heart sounds. Exam reveals no gallop and no friction rub.  No murmur heard.  Pulmonary/Chest: Effort normal and breath sounds normal. No respiratory distress.  has no wheezes.  Neck = supple, no nuchal rigidity Abdominal: Soft. Bowel sounds are normal.  exhibits no distension. There is no tenderness.  Lymphadenopathy: no cervical adenopathy. No axillary adenopathy. + left inguinal node Ext: left ankle and lower extremity swelling to mid shin. Tenderness to touch, pitting edema and erythema noted incomparison to right leg Neurological: alert and oriented to person, place, and time.  Skin: Skin is warm and dry. No rash noted. No erythema.  Psychiatric: a normal mood and affect.  behavior is normal.    LABS: Results for orders placed or performed during the hospital encounter of 12/17/19 (from the past 48 hour(s))  Lactic acid, plasma     Status: None   Collection Time: 12/17/19  5:20 PM  Result Value Ref Range   Lactic Acid, Venous 1.5 0.5 - 1.9 mmol/L    Comment: Performed at [facility], [place] [address], [place], [place] [address]  HIV Antibody (routine testing w rflx)     Status: None   Collection Time: 12/18/19  4:56 AM  Result Value Ref Range   HIV Screen 4th Generation wRfx Non Reactive Non  Reactive    Comment: Performed at [facility], [place] [address], [place], [place] [address]  Creatinine, serum     Status: None   Collection Time: 12/18/19  4:56 AM  Result Value Ref Range   Creatinine, Ser 0.92 0.44 - 1.00 mg/dL   GFR calc non Af Amer >60 >60 mL/min   GFR calc Af Amer >60 >60 mL/min    Comment: Performed at [facility], [place] [address], [place], [place] [address]  CBC with Differential/Platelet     Status: Abnormal   Collection Time: 12/18/19 10:16 AM  Result Value Ref Range   WBC 11.5 (H) 4.0 - 10.5 K/uL   RBC 3.87 3.87 - 5.11 MIL/uL   Hemoglobin 12.0 12.0 - 15.0 g/dL   HCT 37.0 36 - 46 %   MCV 95.6 80.0 - 100.0 fL   MCH 31.0 26.0 - 34.0 pg   MCHC 32.4 30.0 - 36.0 g/dL  RDW 13.2 11.5 - 15.5 %   Platelets 171 150 - 400 K/uL   nRBC 0.0 0.0 - 0.2 %   Neutrophils Relative % 89 %   Neutro Abs 10.3 (H) 1.7 - 7.7 K/uL   Lymphocytes Relative 7 %   Lymphs Abs 0.8 0.7 - 4.0 K/uL   Monocytes Relative 3 %   Monocytes Absolute 0.3 0 - 1 K/uL   Eosinophils Relative 0 %   Eosinophils Absolute 0.0 0 - 0 K/uL   Basophils Relative 0 %   Basophils Absolute 0.0 0 - 0 K/uL   WBC Morphology VACUOLATED NEUTROPHILS    Immature Granulocytes 1 %   Abs Immature Granulocytes 0.08 (H) 0.00 - 0.07 K/uL    Comment: Performed at [facility], [place] [address] [place], [place] [address]  Comprehensive metabolic panel     Status: Abnormal   Collection Time: 12/18/19 10:16 AM  Result Value Ref Range   Sodium 134 (L) 135 - 145 mmol/L   Potassium 3.2 (L) 3.5 - 5.1 mmol/L   Chloride 99 98 - 111 mmol/L   CO2 23 22 - 32 mmol/L   Glucose, Bld 120 (H) 70 - 99 mg/dL    Comment: Glucose reference range applies only to samples taken after fasting for at least 8 hours.   BUN 13 6 - 20 mg/dL   Creatinine, Ser 0.96 0.44 - 1.00 mg/dL   Calcium 8.2 (L) 8.9 - 10.3 mg/dL   Total Protein 6.9 6.5 - 8.1 g/dL   Albumin 2.9 (L) 3.5 - 5.0 g/dL   AST 19 15 - 41  U/L   ALT 15 0 - 44 U/L   Alkaline Phosphatase 50 38 - 126 U/L   Total Bilirubin 0.9 0.3 - 1.2 mg/dL   GFR calc non Af Amer >60 >60 mL/min   GFR calc Af Amer >60 >60 mL/min   Anion gap 12 5 - 15    Comment: Performed at [facility], [place] [address] [place], [place] [address]  CBC with Differential/Platelet     Status: Abnormal   Collection Time: 12/19/19  5:00 AM  Result Value Ref Range   WBC 10.6 (H) 4.0 - 10.5 K/uL   RBC 3.56 (L) 3.87 - 5.11 MIL/uL   Hemoglobin 11.0 (L) 12.0 - 15.0 g/dL   HCT 33.2 (L) 36 - 46 %   MCV 93.3 80.0 - 100.0 fL   MCH 30.9 26.0 - 34.0 pg   MCHC 33.1 30.0 - 36.0 g/dL   RDW 13.1 11.5 - 15.5 %   Platelets 159 150 - 400 K/uL   nRBC 0.0 0.0 - 0.2 %   Neutrophils Relative % 84 %   Neutro Abs 9.0 (H) 1.7 - 7.7 K/uL   Lymphocytes Relative 11 %   Lymphs Abs 1.1 0.7 - 4.0 K/uL   Monocytes Relative 4 %   Monocytes Absolute 0.5 0 - 1 K/uL   Eosinophils Relative 0 %   Eosinophils Absolute 0.0 0 - 0 K/uL   Basophils Relative 0 %   Basophils Absolute 0.0 0 - 0 K/uL   Immature Granulocytes 1 %   Abs Immature Granulocytes 0.06 0.00 - 0.07 K/uL    Comment: Performed at [facility], [place] [address], [place], [place] [address]  Comprehensive metabolic panel     Status: Abnormal   Collection Time: 12/19/19  5:00 AM  Result Value Ref Range   Sodium 134 (L) 135 - 145 mmol/L   Potassium  3.0 (L) 3.5 - 5.1 mmol/L   Chloride 102 98 - 111 mmol/L   CO2 23 22 - 32 mmol/L   Glucose, Bld 116 (H) 70 - 99 mg/dL    Comment: Glucose reference range applies only to samples taken after fasting for at least 8 hours.   BUN 9 6 - 20 mg/dL   Creatinine, Ser 0.71 0.44 - 1.00 mg/dL   Calcium 7.9 (L) 8.9 - 10.3 mg/dL   Total Protein 6.7 6.5 - 8.1 g/dL   Albumin 2.9 (L) 3.5 - 5.0 g/dL   AST 21 15 - 41 U/L   ALT 20 0 - 44 U/L   Alkaline Phosphatase 47 38 - 126 U/L   Total Bilirubin 0.6 0.3 - 1.2 mg/dL   GFR calc non Af Amer >60 >60 mL/min   GFR  calc Af Amer >60 >60 mL/min   Anion gap 9 5 - 15    Comment: Performed at [facility], [place] [address] [place], [place] [address]    MICRO: reviewed IMAGING: CT TIBIA FIBULA LEFT W CONTRAST  Result Date: 12/19/2019 CLINICAL DATA:  Left lower extremity pain, cellulitis EXAM: CT OF THE LEFT TIBIA-FIBULA AND ANKLE WITH CONTRAST TECHNIQUE: Multidetector CT imaging of the left lower extremity was performed following the standard protocol during bolus administration of intravenous contrast. Field of view is from the left knee through the left ankle. CONTRAST:  165mL OMNIPAQUE IOHEXOL 300 MG/ML  SOLN COMPARISON:  X-ray 12/17/2019 FINDINGS: Bones/Joint/Cartilage No acute fracture. No dislocation. No bony erosion or periostitis. Moderate tricompartmental osteoarthritis of the left knee manifested joint space narrowing, subchondral sclerosis, and prominent marginal osteophyte formation. Degree of arthropathy is advanced for age. Small left knee joint effusion. The left ankle and hindfoot are anatomic Lea aligned without dislocation. Mild-to-moderate arthropathy of the left tibiotalar joint with anterior osteophytic spurring. Mild degenerative changes of the posterior subtalar joint and within the midfoot. No appreciable tibiotalar or subtalar joint effusion. Ligaments Suboptimally assessed by CT. Muscles and Tendons Preserved muscle bulk without atrophy or fatty infiltration. No intramuscular fluid collection. Included tendinous structures appear intact within the limitations of CT. Soft tissues Circumferential subcutaneous edema of the left lower leg which is most pronounced in the region of the mid to distal tibial diaphysis, particularly along the medial aspect. No organized or rim enhancing fluid collection. No soft tissue gas. No discrete soft tissue ulceration. IMPRESSION: 1. Circumferential subcutaneous edema of the left lower leg which is most pronounced in the region of the mid to distal  tibial diaphysis, particularly along the medial aspect. No organized or rim-enhancing fluid collection. No soft tissue gas. Findings are nonspecific but can be seen in the setting of cellulitis. 2. No acute osseous findings.  No evidence of osteomyelitis. 3. Degenerative changes including moderate tricompartmental osteoarthritis of the left knee. The degree of arthropathy is advanced for age. 4. Small left knee joint effusion, nonspecific. Electronically Signed   By: Davina Poke D.O.   On: 12/19/2019 08:15   CT ANKLE LEFT W CONTRAST  Result Date: 12/19/2019 CLINICAL DATA:  Left lower extremity pain, cellulitis EXAM: CT OF THE LEFT TIBIA-FIBULA AND ANKLE WITH CONTRAST TECHNIQUE: Multidetector CT imaging of the left lower extremity was performed following the standard protocol during bolus administration of intravenous contrast. Field of view is from the left knee through the left ankle. CONTRAST:  169mL OMNIPAQUE IOHEXOL 300 MG/ML  SOLN COMPARISON:  X-ray 12/17/2019 FINDINGS: Bones/Joint/Cartilage No acute fracture. No dislocation. No bony erosion or  periostitis. Moderate tricompartmental osteoarthritis of the left knee manifested joint space narrowing, subchondral sclerosis, and prominent marginal osteophyte formation. Degree of arthropathy is advanced for age. Small left knee joint effusion. The left ankle and hindfoot are anatomic Lea aligned without dislocation. Mild-to-moderate arthropathy of the left tibiotalar joint with anterior osteophytic spurring. Mild degenerative changes of the posterior subtalar joint and within the midfoot. No appreciable tibiotalar or subtalar joint effusion. Ligaments Suboptimally assessed by CT. Muscles and Tendons Preserved muscle bulk without atrophy or fatty infiltration. No intramuscular fluid collection. Included tendinous structures appear intact within the limitations of CT. Soft tissues Circumferential subcutaneous edema of the left lower leg which is most  pronounced in the region of the mid to distal tibial diaphysis, particularly along the medial aspect. No organized or rim enhancing fluid collection. No soft tissue gas. No discrete soft tissue ulceration. IMPRESSION: 1. Circumferential subcutaneous edema of the left lower leg which is most pronounced in the region of the mid to distal tibial diaphysis, particularly along the medial aspect. No organized or rim-enhancing fluid collection. No soft tissue gas. Findings are nonspecific but can be seen in the setting of cellulitis. 2. No acute osseous findings.  No evidence of osteomyelitis. 3. Degenerative changes including moderate tricompartmental osteoarthritis of the left knee. The degree of arthropathy is advanced for age. 4. Small left knee joint effusion, nonspecific. Electronically Signed   By: Davina Poke D.O.   On: 12/19/2019 08:15   ECHOCARDIOGRAM COMPLETE  Result Date: 12/18/2019    ECHOCARDIOGRAM REPORT   Patient Name:   SHAWNETTE AUGELLO Date of Exam: 12/18/2019 Medical Rec #:  [id number]           Height:       64.0 in Accession #:    [id number]          Weight:       309.0 lb Date of Birth:  [birth year]          BSA:          2.354 m Patient Age:    60 years            BP:           134/72 mmHg Patient Gender: F                   HR:           102 bpm. Exam Location:  Inpatient Procedure: 2D Echo, Cardiac Doppler and Color Doppler Indications:    Bacteremia 790.7 / R78.81  History:        Patient has no prior history of Echocardiogram examinations.                 Signs/Symptoms:Murmur; Risk Factors:Hypertension. MRSA.  Sonographer:    Jonelle Sidle Dance Referring Phys: [id number] AVA SWAYZE IMPRESSIONS  1. Left ventricular ejection fraction, by estimation, is 65 to 70%. The left ventricle has normal function. The left ventricle has no regional wall motion abnormalities. There is mild left ventricular hypertrophy. Left ventricular diastolic parameters were normal.  2. Right ventricular systolic function is  normal. The right ventricular size is normal. Tricuspid regurgitation signal is inadequate for assessing PA pressure.  3. The mitral valve is normal in structure. No evidence of mitral valve regurgitation.  4. The aortic valve was not well visualized. Aortic valve regurgitation is not visualized. No aortic stenosis is present.  5. Appears to be a small echodensity on septal leaflet of tricuspid valve, seen on RV inflow  view (images 16 and 91). Not seen on other views. No significant tricuspid regurgitation. Recommend TEE for further evaluation. FINDINGS  Left Ventricle: Left ventricular ejection fraction, by estimation, is 65 to 70%. The left ventricle has normal function. The left ventricle has no regional wall motion abnormalities. The left ventricular internal cavity size was normal in size. There is  mild left ventricular hypertrophy. Left ventricular diastolic parameters were normal. Right Ventricle: The right ventricular size is normal. Right vetricular wall thickness was not assessed. Right ventricular systolic function is normal. Tricuspid regurgitation signal is inadequate for assessing PA pressure. Left Atrium: Left atrial size was normal in size. Right Atrium: Right atrial size was normal in size. Pericardium: There is no evidence of pericardial effusion. Mitral Valve: The mitral valve is normal in structure. No evidence of mitral valve regurgitation. Tricuspid Valve: Appears to be an echodensity on septal leaflet of tricuspid valve, best seen on RV inflow view. No significant regurgitation. Recommend TEE for further evaluation. The tricuspid valve is normal in structure. Tricuspid valve regurgitation  is trivial. Aortic Valve: The aortic valve was not well visualized. Aortic valve regurgitation is not visualized. No aortic stenosis is present. Pulmonic Valve: The pulmonic valve was not well visualized. Pulmonic valve regurgitation is trivial. Aorta: The aortic root and ascending aorta are structurally  normal, with no evidence of dilitation. IAS/Shunts: The interatrial septum was not well visualized.  LEFT VENTRICLE PLAX 2D LVIDd:         4.70 cm  Diastology LVIDs:         2.70 cm  LV e' lateral:   23.30 cm/s LV PW:         1.00 cm  LV E/e' lateral: 5.3 LV IVS:        1.10 cm  LV e' medial:    12.70 cm/s LVOT diam:     1.80 cm  LV E/e' medial:  9.8 LV SV:         63 LV SV Index:   27 LVOT Area:     2.54 cm  RIGHT VENTRICLE             IVC RV Basal diam:  3.00 cm     IVC diam: 1.80 cm RV Mid diam:    2.20 cm RV S prime:     12.70 cm/s TAPSE (M-mode): 2.7 cm LEFT ATRIUM             Index       RIGHT ATRIUM           Index LA diam:        3.90 cm 1.66 cm/m  RA Area:     13.10 cm LA Vol (A2C):   47.0 ml 19.97 ml/m RA Volume:   33.90 ml  14.40 ml/m LA Vol (A4C):   49.3 ml 20.95 ml/m LA Biplane Vol: 52.1 ml 22.14 ml/m  AORTIC VALVE LVOT Vmax:   149.00 cm/s LVOT Vmean:  98.400 cm/s LVOT VTI:    0.248 m  AORTA Ao Root diam: 3.30 cm MITRAL VALVE MV Area (PHT): 3.91 cm     SHUNTS MV Decel Time: 194 msec     Systemic VTI:  0.25 m MV E velocity: 124.00 cm/s  Systemic Diam: 1.80 cm MV A velocity: 90.70 cm/s MV E/A ratio:  1.37 Oswaldo Milian MD Electronically signed by Oswaldo Milian MD Signature Date/Time: 12/18/2019/4:08:46 PM    Final     Assessment/Plan:  39yo F admitted with left leg cellulitis found to  have incidental finding of possible TV vegetation  Cellulitis left leg = would narrow to cefazolin. No draining lesions that is suggestive of MRSA   TV vegetation = unclear if true pathogen. Plan on treating cellulitis. Will recommend to get TEE for better visualization. Recommend repeat blood cx  Inguinal lymphadenopathy = could be consistent with cellulitis of left leg  Dr Megan Salon to see on monday

## 2019-12-20 LAB — COMPREHENSIVE METABOLIC PANEL
ALT: 29 U/L (ref 0–44)
AST: 28 U/L (ref 15–41)
Albumin: 2.7 g/dL — ABNORMAL LOW (ref 3.5–5.0)
Alkaline Phosphatase: 53 U/L (ref 38–126)
Anion gap: 8 (ref 5–15)
BUN: 6 mg/dL (ref 6–20)
CO2: 24 mmol/L (ref 22–32)
Calcium: 8 mg/dL — ABNORMAL LOW (ref 8.9–10.3)
Chloride: 102 mmol/L (ref 98–111)
Creatinine, Ser: 0.68 mg/dL (ref 0.44–1.00)
GFR calc Af Amer: >60 mL/min (ref >60)
GFR calc non Af Amer: >60 mL/min (ref >60)
Glucose, Bld: 128 mg/dL — ABNORMAL HIGH (ref 70–99)
Potassium: 3 mmol/L — ABNORMAL LOW (ref 3.5–5.1)
Sodium: 134 mmol/L — ABNORMAL LOW (ref 135–145)
Total Bilirubin: 0.7 mg/dL (ref 0.3–1.2)
Total Protein: 6.8 g/dL (ref 6.5–8.1)

## 2019-12-20 LAB — CULTURE, BLOOD (ROUTINE X 2): Special Requests: ADEQUATE

## 2019-12-20 LAB — LACTIC ACID, PLASMA
Lactic Acid, Venous: 1.2 mmol/L (ref 0.5–1.9)
Lactic Acid, Venous: 1.2 mmol/L (ref 0.5–1.9)

## 2019-12-20 MED ORDER — POTASSIUM CHLORIDE 10 MEQ/100ML IV SOLN
10.0000 meq | INTRAVENOUS | Status: AC
  Administered 2019-12-20 (×4): 10 meq via INTRAVENOUS
  Filled 2019-12-20 (×5): qty 100

## 2019-12-20 NOTE — Progress Notes (Signed)
PROGRESS NOTE  Marissa Joseph XVQ:[id number] DOB: 30-Dec-1980 DOA: 12/17/2019 PCP: Binnie Rail, MD  Brief History   Marissa Joseph is a 39 y.o. female with medical history significant of morbid obesity, recurrent LLE cellulitis who presents to the ED with acute LLE pain and swelling that started the evening prior to visit. 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 No active drainage. No pruritis. Given worsening pain, pt presented to ED for further work up.   Of note, pt was admitted multiple times for recurrent LLE cellulitis years ago, historically treated with keflex as well as doxycycline  ED Course: In the ED, pt noted to be febrile with tmax of 101.41F with tachycardia. WBC noted to be 12.7k. Blood cx ordered and pending. Tib/fib xray ordered in ED with no acute destructive bony lesions noted with diffuse subcutaneous edema, reviewed. Pt was started on broad spectrum abx. Hospitalist consulted for consideration for admission.  [facility] were consulted to admit the patient for further evaluation and care.   The patient was admitted to a telemetry bed. She was started on IV Merrem and vancomycin. A blood cultures was obtained and demonstrated growth of staph species that is described as coagulase negative staph with oxacillin resistance. Blood cultures have been repeated and antibiotics have been narrowed to Vancomycin and rocephin. TTE was obtained and demonstrated possible vegetations on the tricuspid valve. TEE has been ordered to confirm. This will take place Monday 12/22/2019.  CT of the tib/fib and ankle has ruled out abscess. In fectious disease has been consulted.  Consultants  . Infectious disease . Cardiology  Procedures  . None  Antibiotics   Anti-infectives (From admission, onward)   Start     Dose/Rate Route Frequency Ordered Stop     12/19/19 1800  ceFAZolin (ANCEF) IVPB 2g/100 mL premix     Discontinue     2 g 200 mL/hr over 30 Minutes Intravenous Every 8 hours 12/19/19 1645     12/18/19 1800  cefTRIAXone (ROCEPHIN) 2 g in sodium chloride 0.9 % 100 mL IVPB  Status:  Discontinued        2 g 200 mL/hr over 30 Minutes Intravenous Every 24 hours 12/18/19 1404 12/19/19 1644   12/18/19 0600  vancomycin (VANCOREADY) IVPB 1750 mg/350 mL  Status:  Discontinued        1,750 mg 175 mL/hr over 120 Minutes Intravenous Every 12 hours 12/17/19 1737 12/19/19 1644   12/17/19 2300  meropenem (MERREM) 1 g in sodium chloride 0.9 % 100 mL IVPB  Status:  Discontinued        1 g 200 mL/hr over 30 Minutes Intravenous Every 8 hours 12/17/19 1721 12/18/19 1404   12/17/19 1800  vancomycin (VANCOCIN) IVPB 1000 mg/200 mL premix  Status:  Discontinued        1,000 mg 200 mL/hr over 60 Minutes Intravenous  Once 12/17/19 1537 12/17/19 1644   12/17/19 1800  vancomycin (VANCOREADY) IVPB 2000 mg/400 mL        2,000 mg 200 mL/hr over 120 Minutes Intravenous  Once 12/17/19 1644 12/17/19 2255   12/17/19 1630  vancomycin (VANCOCIN) IVPB 1000 mg/200 mL premix  Status:  Discontinued        1,000 mg 200 mL/hr over 60 Minutes Intravenous  Once 12/17/19 1537 12/17/19 1644   12/17/19 1600  meropenem (MERREM) 1 g in sodium chloride 0.9 % 100  mL IVPB        1 g 200 mL/hr over 30 Minutes Intravenous  Once 12/17/19 1537 12/17/19 1855   12/17/19 1400  cefTRIAXone (ROCEPHIN) 2 g in sodium chloride 0.9 % 100 mL IVPB  Status:  Discontinued        2 g 200 mL/hr over 30 Minutes Intravenous Every 24 hours 12/17/19 1354 12/17/19 1537     Subjective  The patient is sitting up in bed. She continues to complain of back pain.  Objective   Vitals:  Vitals:   12/20/19 1037 12/20/19 1333  BP:  113/63  Pulse:  (!) 103  Resp:  (!) 22  Temp:  100 F (37.8 C)  SpO2: 97% 96%  Exam:  Constitutional:  . The patient is awake, alert, and oriented x 3. No acute  distress. Respiratory:  . No increased work of breathing. . No wheezes, rales, or rhonchi . No tactile fremitus Cardiovascular:  . Regular rate and rhythm . No murmurs, ectopy, or gallups. . No lateral PMI. No thrills. Abdomen:  . Abdomen is soft, non-tender, non-distended . Morbidly obese . Unable to evaluate the abdomen for hernias, masses, or organomegaly due to body habitus . Hypoactive bowel sounds.  Musculoskeletal:  . No cyanosis, clubbing, or edema of the right lower extremity. . Left lower extremity is less swollen and less warm. It remains tender to touch, although the patient states that it is much better than previous. Feels less indurated posteriorly. Skin:  . No rashes, lesions, ulcers . palpation of skin: no induration or nodules . See above for abnormalities of left lower extremities. Neurologic:  . CN 2-12 intact . Sensation all 4 extremities intact Psychiatric:  . Mental status o Mood, affect appropriate o Orientation to person, place, time  . judgment and insight appear intact  I have personally reviewed the following:   Today's Data  . Vitals, BMP, CBC  Micro Data  . Blood culture 1/1 positive for staph hominis and staph epididimis that was oxacillin resistant.  TEE ordered to verify findings of TTE.  Imaging  . CT left Tib/fib/ankle - rules out abscess.  Cardiology Data  . TTE: Concern for vegetation of tricuspid valve. . TEE: Pending  Scheduled Meds: . enoxaparin (LOVENOX) injection  70 mg Subcutaneous Q24H  . gabapentin  200 mg Oral QHS  . irbesartan  150 mg Oral Daily   And  . hydrochlorothiazide  12.5 mg Oral Daily  . mometasone-formoterol  2 puff Inhalation BID  . sodium chloride flush  3 mL Intravenous Once   Continuous Infusions: . sodium chloride 100 mL/hr at 12/20/19 1154  .  ceFAZolin (ANCEF) IV 2 g (12/20/19 0933)  . potassium chloride 10 mEq (12/20/19 1356)    Principal Problem:   Sepsis due to cellulitis [facility]) Active  Problems:   Obstructive sleep apnea   Asthma   Osteoarthritis of left knee   Essential hypertension, benign   Depression   Primary osteoarthritis of right knee   Morbid obesity ([place])   LOS: 3 days   A & P  LLE cellulitis with sepsis present on admit: The patient received IV merrem and vanc initially. This has been changed to IV Rocephin and Vancomycin following results of growth of GPC in 1/1 blood culture. Pt states that there is improvement in the limb today, although it appears acutely infected to me. 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 CT of tib/fib and  ankle has ruled out abscess. Will add lasix to decrease edema seen on CT.  Bacteremia: 1/1 blood cultures obtained on admission have grown out Coagulase negative staph. Ordinarily this would be dismissed as a contaminant. However, before the ID was made, the TTE was returned with concern for tricuspid vegetation. TEE has been ordered to clarify. Will consult ID in the am. Also consider CT of the Thoracic spine as the patient was complaining of pain there this morning. Infectious disease has been consulted.  Asthma: Noted and stable. Beta agonists are available on an as needed basis.  HTN: The patient is normotensive on her home doses of avapro and HCTA. Monitor.  Depression: Noted.   Morbid obesity: Complicates all cares. Recommend diet and lifestyle modification as guided by her PCP.  Hx osteoarthritis: Noted and stable.  I have seen and examined this patient myself. I have spent 32 minutes in her evaluation and care.  DVT prophylaxis: Lovenox subq  Code Status: Full Family Communication: Pt in room, pt's boyfriend at bedside Disposition Plan: The patient is from home. Anticipate discharge to home. Barriers to discharge: Need to clarify significance of blood culture with TEE, repeat cultures, and ID consult. Need for IV antibiotics and to rule out abscess in the left lower  extremity.  Status is: Inpatient  Remains inpatient appropriate because:Ongoing diagnostic testing needed not appropriate for outpatient work up, IV treatments appropriate due to intensity of illness or inability to take PO and Inpatient level of care appropriate due to severity of illness   Dispo: The patient is from: Home              Anticipated d/c is to: Home              Anticipated d/c date is: 2 days              Patient currently is not medically stable to d/c.  Hancel Ion, DO [facility] Direct contact: see [email or web address]  7PM-7AM contact night coverage as above 12/20/2019, 2:46 PM  LOS: 1 day

## 2019-12-20 NOTE — Progress Notes (Signed)
Resumed care for this pt at 2300. I agree with previous RNs assessment. Pt resting in bed at this time. 

## 2019-12-21 LAB — BASIC METABOLIC PANEL
Anion gap: 9 (ref 5–15)
BUN: 7 mg/dL (ref 6–20)
CO2: 25 mmol/L (ref 22–32)
Calcium: 8.2 mg/dL — ABNORMAL LOW (ref 8.9–10.3)
Chloride: 103 mmol/L (ref 98–111)
Creatinine, Ser: 0.66 mg/dL (ref 0.44–1.00)
GFR calc Af Amer: >60 mL/min (ref >60)
GFR calc non Af Amer: >60 mL/min (ref >60)
Glucose, Bld: 119 mg/dL — ABNORMAL HIGH (ref 70–99)
Potassium: 3.2 mmol/L — ABNORMAL LOW (ref 3.5–5.1)
Sodium: 137 mmol/L (ref 135–145)

## 2019-12-21 LAB — MRSA PCR SCREENING: MRSA by PCR: NEGATIVE

## 2019-12-21 MED ORDER — POTASSIUM CHLORIDE 10 MEQ/100ML IV SOLN
10.0000 meq | INTRAVENOUS | Status: AC
  Administered 2019-12-21 (×2): 10 meq via INTRAVENOUS

## 2019-12-21 MED ORDER — FUROSEMIDE 10 MG/ML IJ SOLN
20.0000 mg | Freq: Two times a day (BID) | INTRAMUSCULAR | Status: DC
  Administered 2019-12-21 – 2019-12-23 (×5): 20 mg via INTRAVENOUS
  Filled 2019-12-21 (×7): qty 2

## 2019-12-21 MED ORDER — POTASSIUM CHLORIDE 10 MEQ/100ML IV SOLN
10.0000 meq | INTRAVENOUS | Status: AC
  Administered 2019-12-21: 10 meq via INTRAVENOUS
  Filled 2019-12-21: qty 100

## 2019-12-21 NOTE — Progress Notes (Signed)
Pt lost IV access at 1205.  Potassium runs and IV antibiotics were stopped.  IV team was consulted to place new IV d/t patient being difficult stick.   IV team was not able to come until 1500.  New IV was placed at that time and potassium runs were restarted.

## 2019-12-21 NOTE — Progress Notes (Signed)
PROGRESS NOTE  TEMPEST FRANKLAND ENI:[id number] DOB: May 10, 1981 DOA: 12/17/2019 PCP: Binnie Rail, MD  Brief History   Marissa Joseph is a 39 y.o. female with medical history significant of morbid obesity, recurrent LLE cellulitis who presents to the ED with acute LLE pain and swelling that started the evening prior to visit. 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 No active drainage. No pruritis. Given worsening pain, pt presented to ED for further work up.   Of note, pt was admitted multiple times for recurrent LLE cellulitis years ago, historically treated with keflex as well as doxycycline  ED Course: In the ED, pt noted to be febrile with tmax of 101.70F with tachycardia. WBC noted to be 12.7k. Blood cx ordered and pending. Tib/fib xray ordered in ED with no acute destructive bony lesions noted with diffuse subcutaneous edema, reviewed. Pt was started on broad spectrum abx. Hospitalist consulted for consideration for admission.  [facility] were consulted to admit the patient for further evaluation and care.   The patient was admitted to a telemetry bed. She was started on IV Merrem and vancomycin. A blood cultures was obtained and demonstrated growth of staph species that is described as coagulase negative staph with oxacillin resistance. Blood cultures have been repeated and antibiotics have been narrowed to Vancomycin and rocephin. TTE was obtained and demonstrated possible vegetations on the tricuspid valve. TEE has been ordered to confirm. This will take place Monday 12/22/2019.  CT of the tib/fib and ankle has ruled out abscess. Infectious disease has been consulted. The patient has been continued on IV Rocephin.  Consultants  . Infectious disease . Cardiology  Procedures  . None  Antibiotics   Anti-infectives (From admission, onward)    Start     Dose/Rate Route Frequency Ordered Stop   12/19/19 1800  ceFAZolin (ANCEF) IVPB 2g/100 mL premix     Discontinue     2 g 200 mL/hr over 30 Minutes Intravenous Every 8 hours 12/19/19 1645     12/18/19 1800  cefTRIAXone (ROCEPHIN) 2 g in sodium chloride 0.9 % 100 mL IVPB  Status:  Discontinued        2 g 200 mL/hr over 30 Minutes Intravenous Every 24 hours 12/18/19 1404 12/19/19 1644   12/18/19 0600  vancomycin (VANCOREADY) IVPB 1750 mg/350 mL  Status:  Discontinued        1,750 mg 175 mL/hr over 120 Minutes Intravenous Every 12 hours 12/17/19 1737 12/19/19 1644   12/17/19 2300  meropenem (MERREM) 1 g in sodium chloride 0.9 % 100 mL IVPB  Status:  Discontinued        1 g 200 mL/hr over 30 Minutes Intravenous Every 8 hours 12/17/19 1721 12/18/19 1404   12/17/19 1800  vancomycin (VANCOCIN) IVPB 1000 mg/200 mL premix  Status:  Discontinued        1,000 mg 200 mL/hr over 60 Minutes Intravenous  Once 12/17/19 1537 12/17/19 1644   12/17/19 1800  vancomycin (VANCOREADY) IVPB 2000 mg/400 mL        2,000 mg 200 mL/hr over 120 Minutes Intravenous  Once 12/17/19 1644 12/17/19 2255   12/17/19 1630  vancomycin (VANCOCIN) IVPB 1000 mg/200 mL premix  Status:  Discontinued        1,000 mg 200 mL/hr over 60 Minutes Intravenous  Once 12/17/19 1537 12/17/19 1644   12/17/19 1600  meropenem (MERREM) 1 g  in sodium chloride 0.9 % 100 mL IVPB        1 g 200 mL/hr over 30 Minutes Intravenous  Once 12/17/19 1537 12/17/19 1855   12/17/19 1400  cefTRIAXone (ROCEPHIN) 2 g in sodium chloride 0.9 % 100 mL IVPB  Status:  Discontinued        2 g 200 mL/hr over 30 Minutes Intravenous Every 24 hours 12/17/19 1354 12/17/19 1537     Subjective  The patient is sitting up in bed. No new complaints.  Objective   Vitals:  Vitals:   12/21/19 0953 12/21/19 1305  BP: 125/84 (!) 128/9  Pulse: (!) 106 (!) 107  Resp: 16 (!) 26  Temp: 99.2 F (37.3 C) 99.7 F (37.6 C)  SpO2: 97% 98%  Exam:  Constitutional:   . The patient is awake, alert, and oriented x 3. No acute distress. Respiratory:  . No increased work of breathing. . No wheezes, rales, or rhonchi . No tactile fremitus Cardiovascular:  . Regular rate and rhythm . No murmurs, ectopy, or gallups. . No lateral PMI. No thrills. Abdomen:  . Abdomen is soft, non-tender, non-distended . Morbidly obese . Unable to evaluate the abdomen for hernias, masses, or organomegaly due to body habitus . Hypoactive bowel sounds.  Musculoskeletal:  . No cyanosis, clubbing, or edema of the right lower extremity. . Left lower extremity is much less swollen today with wrinkling and also less warm. It is no longer tender to touch. Induration is improved. Skin:  . No rashes, lesions, ulcers . palpation of skin: no induration or nodules . See above for abnormalities of left lower extremities. Neurologic:  . CN 2-12 intact . Sensation all 4 extremities intact Psychiatric:  . Mental status o Mood, affect appropriate o Orientation to person, place, time  . judgment and insight appear intact  I have personally reviewed the following:   Today's Data  . Vitals, BMP  Micro Data  . Blood culture 1/1 positive for staph hominis and staph epididimis that was oxacillin resistant.  TEE ordered to verify findings of TTE. Marland Kitchen Repeat blood cultures: No growth  Imaging  . CT left Tib/fib/ankle - rules out abscess.  Cardiology Data  . TTE: Concern for vegetation of tricuspid valve. . TEE: Pending  Scheduled Meds: . enoxaparin (LOVENOX) injection  70 mg Subcutaneous Q24H  . furosemide  20 mg Intravenous BID  . gabapentin  200 mg Oral QHS  . irbesartan  150 mg Oral Daily   And  . hydrochlorothiazide  12.5 mg Oral Daily  . mometasone-formoterol  2 puff Inhalation BID  . sodium chloride flush  3 mL Intravenous Once   Continuous Infusions: .  ceFAZolin (ANCEF) IV Stopped (12/21/19 1206)  . potassium chloride Stopped (12/21/19 1155)    Principal  Problem:   Sepsis due to cellulitis [facility]) Active Problems:   Obstructive sleep apnea   Asthma   Osteoarthritis of left knee   Essential hypertension, benign   Depression   Primary osteoarthritis of right knee   Morbid obesity ([place])   LOS: 4 days   A & P  LLE cellulitis with sepsis present on admit: The patient received IV merrem and vanc initially. This has been changed to IV Rocephin and Vancomycin following results of growth of GPC in 1/1 blood culture. Pt states that there is improvement in the limb today, although it appears acutely infected to me. 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  CT of tib/fib and ankle has ruled out abscess. Will add lasix to decrease edema seen on CT.  Bacteremia: 1/1 blood cultures obtained on admission have grown out Coagulase negative staph. Ordinarily this would be dismissed as a contaminant. However, before the ID was made, the TTE was returned with concern for tricuspid vegetation. TEE has been ordered to clarify. Will consult ID in the am. Also consider CT of the Thoracic spine as the patient was complaining of pain there this morning. Infectious disease has been consulted.  Asthma: Noted and stable. Beta agonists are available on an as needed basis.  HTN: The patient is normotensive on her home doses of avapro and HCTA. Monitor.  Depression: Noted.   Morbid obesity: Complicates all cares. Recommend diet and lifestyle modification as guided by her PCP.  Hx osteoarthritis: Noted and stable.  I have seen and examined this patient myself. I have spent 30 minutes in her evaluation and care.  DVT prophylaxis: Lovenox subq  Code Status: Full Family Communication: Pt in room, pt's boyfriend at bedside Disposition Plan: The patient is from home. Anticipate discharge to home. Barriers to discharge: Need to clarify significance of blood culture with TEE, repeat cultures, and ID consult. Need for IV antibiotics  and to rule out abscess in the left lower extremity.  Status is: Inpatient  Remains inpatient appropriate because:Ongoing diagnostic testing needed not appropriate for outpatient work up, IV treatments appropriate due to intensity of illness or inability to take PO and Inpatient level of care appropriate due to severity of illness   Dispo: The patient is from: Home              Anticipated d/c is to: Home              Anticipated d/c date is: 2 days              Patient currently is not medically stable to d/c. Barriers to discharge include need for TEE and plan for home antibiotics pending outcome of TEE.  Yaeli Hartung, DO [facility] Direct contact: see [email or web address]  7PM-7AM contact night coverage as above 12/21/2019, 1:48 PM  LOS: 1 day

## 2019-12-21 NOTE — H&P (View-Only) (Signed)
PROGRESS NOTE  Marissa Joseph PJA:[id number] DOB: Feb 23, 1981 DOA: 12/17/2019 PCP: Binnie Rail, MD  Brief History   Marissa Joseph is a 39 y.o. female with medical history significant of morbid obesity, recurrent LLE cellulitis who presents to the ED with acute LLE pain and swelling that started the evening prior to visit. 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 No active drainage. No pruritis. Given worsening pain, pt presented to ED for further work up.   Of note, pt was admitted multiple times for recurrent LLE cellulitis years ago, historically treated with keflex as well as doxycycline  ED Course: In the ED, pt noted to be febrile with tmax of 101.107F with tachycardia. WBC noted to be 12.7k. Blood cx ordered and pending. Tib/fib xray ordered in ED with no acute destructive bony lesions noted with diffuse subcutaneous edema, reviewed. Pt was started on broad spectrum abx. Hospitalist consulted for consideration for admission.  [facility] were consulted to admit the patient for further evaluation and care.   The patient was admitted to a telemetry bed. She was started on IV Merrem and vancomycin. A blood cultures was obtained and demonstrated growth of staph species that is described as coagulase negative staph with oxacillin resistance. Blood cultures have been repeated and antibiotics have been narrowed to Vancomycin and rocephin. TTE was obtained and demonstrated possible vegetations on the tricuspid valve. TEE has been ordered to confirm. This will take place Monday 12/22/2019.  CT of the tib/fib and ankle has ruled out abscess. Infectious disease has been consulted. The patient has been continued on IV Rocephin.  Consultants  . Infectious disease . Cardiology  Procedures  . None  Antibiotics   Anti-infectives (From admission, onward)    Start     Dose/Rate Route Frequency Ordered Stop   12/19/19 1800  ceFAZolin (ANCEF) IVPB 2g/100 mL premix     Discontinue     2 g 200 mL/hr over 30 Minutes Intravenous Every 8 hours 12/19/19 1645     12/18/19 1800  cefTRIAXone (ROCEPHIN) 2 g in sodium chloride 0.9 % 100 mL IVPB  Status:  Discontinued        2 g 200 mL/hr over 30 Minutes Intravenous Every 24 hours 12/18/19 1404 12/19/19 1644   12/18/19 0600  vancomycin (VANCOREADY) IVPB 1750 mg/350 mL  Status:  Discontinued        1,750 mg 175 mL/hr over 120 Minutes Intravenous Every 12 hours 12/17/19 1737 12/19/19 1644   12/17/19 2300  meropenem (MERREM) 1 g in sodium chloride 0.9 % 100 mL IVPB  Status:  Discontinued        1 g 200 mL/hr over 30 Minutes Intravenous Every 8 hours 12/17/19 1721 12/18/19 1404   12/17/19 1800  vancomycin (VANCOCIN) IVPB 1000 mg/200 mL premix  Status:  Discontinued        1,000 mg 200 mL/hr over 60 Minutes Intravenous  Once 12/17/19 1537 12/17/19 1644   12/17/19 1800  vancomycin (VANCOREADY) IVPB 2000 mg/400 mL        2,000 mg 200 mL/hr over 120 Minutes Intravenous  Once 12/17/19 1644 12/17/19 2255   12/17/19 1630  vancomycin (VANCOCIN) IVPB 1000 mg/200 mL premix  Status:  Discontinued        1,000 mg 200 mL/hr over 60 Minutes Intravenous  Once 12/17/19 1537 12/17/19 1644   12/17/19 1600  meropenem (MERREM) 1 g  in sodium chloride 0.9 % 100 mL IVPB        1 g 200 mL/hr over 30 Minutes Intravenous  Once 12/17/19 1537 12/17/19 1855   12/17/19 1400  cefTRIAXone (ROCEPHIN) 2 g in sodium chloride 0.9 % 100 mL IVPB  Status:  Discontinued        2 g 200 mL/hr over 30 Minutes Intravenous Every 24 hours 12/17/19 1354 12/17/19 1537     Subjective  The patient is sitting up in bed. No new complaints.  Objective   Vitals:  Vitals:   12/21/19 0953 12/21/19 1305  BP: 125/84 (!) 128/9  Pulse: (!) 106 (!) 107  Resp: 16 (!) 26  Temp: 99.2 F (37.3 C) 99.7 F (37.6 C)  SpO2: 97% 98%  Exam:  Constitutional:   . The patient is awake, alert, and oriented x 3. No acute distress. Respiratory:  . No increased work of breathing. . No wheezes, rales, or rhonchi . No tactile fremitus Cardiovascular:  . Regular rate and rhythm . No murmurs, ectopy, or gallups. . No lateral PMI. No thrills. Abdomen:  . Abdomen is soft, non-tender, non-distended . Morbidly obese . Unable to evaluate the abdomen for hernias, masses, or organomegaly due to body habitus . Hypoactive bowel sounds.  Musculoskeletal:  . No cyanosis, clubbing, or edema of the right lower extremity. . Left lower extremity is much less swollen today with wrinkling and also less warm. It is no longer tender to touch. Induration is improved. Skin:  . No rashes, lesions, ulcers . palpation of skin: no induration or nodules . See above for abnormalities of left lower extremities. Neurologic:  . CN 2-12 intact . Sensation all 4 extremities intact Psychiatric:  . Mental status o Mood, affect appropriate o Orientation to person, place, time  . judgment and insight appear intact  I have personally reviewed the following:   Today's Data  . Vitals, BMP  Micro Data  . Blood culture 1/1 positive for staph hominis and staph epididimis that was oxacillin resistant.  TEE ordered to verify findings of TTE. Marland Kitchen Repeat blood cultures: No growth  Imaging  . CT left Tib/fib/ankle - rules out abscess.  Cardiology Data  . TTE: Concern for vegetation of tricuspid valve. . TEE: Pending  Scheduled Meds: . enoxaparin (LOVENOX) injection  70 mg Subcutaneous Q24H  . furosemide  20 mg Intravenous BID  . gabapentin  200 mg Oral QHS  . irbesartan  150 mg Oral Daily   And  . hydrochlorothiazide  12.5 mg Oral Daily  . mometasone-formoterol  2 puff Inhalation BID  . sodium chloride flush  3 mL Intravenous Once   Continuous Infusions: .  ceFAZolin (ANCEF) IV Stopped (12/21/19 1206)  . potassium chloride Stopped (12/21/19 1155)    Principal  Problem:   Sepsis due to cellulitis [facility]) Active Problems:   Obstructive sleep apnea   Asthma   Osteoarthritis of left knee   Essential hypertension, benign   Depression   Primary osteoarthritis of right knee   Morbid obesity ([place])   LOS: 4 days   A & P  LLE cellulitis with sepsis present on admit: The patient received IV merrem and vanc initially. This has been changed to IV Rocephin and Vancomycin following results of growth of GPC in 1/1 blood culture. Pt states that there is improvement in the limb today, although it appears acutely infected to me. 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  CT of tib/fib and ankle has ruled out abscess. Will add lasix to decrease edema seen on CT.  Bacteremia: 1/1 blood cultures obtained on admission have grown out Coagulase negative staph. Ordinarily this would be dismissed as a contaminant. However, before the ID was made, the TTE was returned with concern for tricuspid vegetation. TEE has been ordered to clarify. Will consult ID in the am. Also consider CT of the Thoracic spine as the patient was complaining of pain there this morning. Infectious disease has been consulted.  Asthma: Noted and stable. Beta agonists are available on an as needed basis.  HTN: The patient is normotensive on her home doses of avapro and HCTA. Monitor.  Depression: Noted.   Morbid obesity: Complicates all cares. Recommend diet and lifestyle modification as guided by her PCP.  Hx osteoarthritis: Noted and stable.  I have seen and examined this patient myself. I have spent 30 minutes in her evaluation and care.  DVT prophylaxis: Lovenox subq  Code Status: Full Family Communication: Pt in room, pt's boyfriend at bedside Disposition Plan: The patient is from home. Anticipate discharge to home. Barriers to discharge: Need to clarify significance of blood culture with TEE, repeat cultures, and ID consult. Need for IV antibiotics  and to rule out abscess in the left lower extremity.  Status is: Inpatient  Remains inpatient appropriate because:Ongoing diagnostic testing needed not appropriate for outpatient work up, IV treatments appropriate due to intensity of illness or inability to take PO and Inpatient level of care appropriate due to severity of illness   Dispo: The patient is from: Home              Anticipated d/c is to: Home              Anticipated d/c date is: 2 days              Patient currently is not medically stable to d/c. Barriers to discharge include need for TEE and plan for home antibiotics pending outcome of TEE.  Tydus Sanmiguel, DO [facility] Direct contact: see [email or web address]  7PM-7AM contact night coverage as above 12/21/2019, 1:48 PM  LOS: 1 day

## 2019-12-21 NOTE — Progress Notes (Addendum)
   12/21/19 1348  Assess: MEWS Score  Temp 99.7 F (37.6 C)  BP (!) 121/93  Pulse Rate (!) 102  Resp (!) 22  SpO2 98 %    NT alerted RN to patient vitals moving into Yellow MEWS category.  RN assessed patient, including taking a new set of vitals.    Yellow MEWS confirmed and protocol implemented  RN notified CN and DO.  RN will continue to monitor.

## 2019-12-22 ENCOUNTER — Other Ambulatory Visit: Payer: Self-pay

## 2019-12-22 ENCOUNTER — Inpatient Hospital Stay (HOSPITAL_COMMUNITY): Payer: Self-pay | Admitting: Anesthesiology

## 2019-12-22 ENCOUNTER — Encounter (HOSPITAL_COMMUNITY)
Admission: EM | Disposition: A | Discharge status: Home / Self Care | Payer: Self-pay | Source: Home / Self Care | Attending: Internal Medicine

## 2019-12-22 ENCOUNTER — Inpatient Hospital Stay (HOSPITAL_COMMUNITY): Payer: Self-pay

## 2019-12-22 ENCOUNTER — Encounter (HOSPITAL_COMMUNITY): Payer: Self-pay | Admitting: Internal Medicine

## 2019-12-22 DIAGNOSIS — I872 Venous insufficiency (chronic) (peripheral): Secondary | ICD-10-CM

## 2019-12-22 DIAGNOSIS — R7881 Bacteremia: Secondary | ICD-10-CM

## 2019-12-22 DIAGNOSIS — I878 Other specified disorders of veins: Secondary | ICD-10-CM

## 2019-12-22 DIAGNOSIS — L03119 Cellulitis of unspecified part of limb: Secondary | ICD-10-CM

## 2019-12-22 DIAGNOSIS — I10 Essential (primary) hypertension: Secondary | ICD-10-CM

## 2019-12-22 HISTORY — PX: TEE WITHOUT CARDIOVERSION: SHX5443

## 2019-12-22 LAB — BASIC METABOLIC PANEL
Anion gap: 11 (ref 5–15)
BUN: 12 mg/dL (ref 6–20)
CO2: 26 mmol/L (ref 22–32)
Calcium: 8.3 mg/dL — ABNORMAL LOW (ref 8.9–10.3)
Chloride: 100 mmol/L (ref 98–111)
Creatinine, Ser: 0.71 mg/dL (ref 0.44–1.00)
GFR calc Af Amer: >60 mL/min (ref >60)
GFR calc non Af Amer: >60 mL/min (ref >60)
Glucose, Bld: 99 mg/dL (ref 70–99)
Potassium: 3.4 mmol/L — ABNORMAL LOW (ref 3.5–5.1)
Sodium: 137 mmol/L (ref 135–145)

## 2019-12-22 LAB — CULTURE, BLOOD (ROUTINE X 2)
Culture: NO GROWTH
Special Requests: ADEQUATE

## 2019-12-22 SURGERY — ECHOCARDIOGRAM, TRANSESOPHAGEAL
Anesthesia: Monitor Anesthesia Care

## 2019-12-22 MED ORDER — LIDOCAINE 2% (20 MG/ML) 5 ML SYRINGE
INTRAMUSCULAR | Status: DC | PRN
  Administered 2019-12-22: 60 mg via INTRAVENOUS

## 2019-12-22 MED ORDER — PROPOFOL 10 MG/ML IV BOLUS
INTRAVENOUS | Status: DC | PRN
Start: 2019-12-22 — End: 2019-12-22
  Administered 2019-12-22: 30 mg via INTRAVENOUS
  Administered 2019-12-22: 20 mg via INTRAVENOUS
  Administered 2019-12-22 (×2): 30 mg via INTRAVENOUS

## 2019-12-22 MED ORDER — DEXTROSE 10 % IV SOLN: INTRAVENOUS | Status: DC

## 2019-12-22 MED ORDER — PROPOFOL 500 MG/50ML IV EMUL
INTRAVENOUS | Status: DC | PRN
Start: 2019-12-22 — End: 2019-12-22
  Administered 2019-12-22: 100 ug/kg/min via INTRAVENOUS

## 2019-12-22 MED ORDER — LACTATED RINGERS IV SOLN: INTRAVENOUS | Status: DC | PRN

## 2019-12-22 MED ORDER — CEPHALEXIN 500 MG PO CAPS
500.0000 mg | ORAL_CAPSULE | Freq: Three times a day (TID) | ORAL | Status: DC
  Administered 2019-12-22 – 2019-12-23 (×4): 500 mg via ORAL
  Filled 2019-12-22 (×4): qty 1

## 2019-12-22 MED ORDER — POTASSIUM CHLORIDE CRYS ER 20 MEQ PO TBCR
40.0000 meq | EXTENDED_RELEASE_TABLET | Freq: Two times a day (BID) | ORAL | Status: DC
  Administered 2019-12-22 – 2019-12-23 (×3): 40 meq via ORAL
  Filled 2019-12-22 (×3): qty 2

## 2019-12-22 MED ORDER — POTASSIUM CHLORIDE 10 MEQ/100ML IV SOLN
10.0000 meq | INTRAVENOUS | Status: AC
  Administered 2019-12-22: 10 meq via INTRAVENOUS
  Filled 2019-12-22 (×2): qty 100

## 2019-12-22 NOTE — Progress Notes (Signed)
VAST consulted to obtain IV access for potassium runs. Upon arriving at bedside, physician completing teaching with patient. VAST will return to assess vasculature.

## 2019-12-22 NOTE — Interval H&P Note (Signed)
History and Physical Interval Note:  12/22/2019 11:39 AM  Marissa Joseph  has presented today for surgery, with the diagnosis of BACTEREMIA.  The various methods of treatment have been discussed with the patient and family. After consideration of risks, benefits and other options for treatment, the patient has consented to  Procedure(s): TRANSESOPHAGEAL ECHOCARDIOGRAM (TEE) (N/A) as a surgical intervention.  The patient's history has been reviewed, patient examined, no change in status, stable for surgery.  I have reviewed the patient's chart and labs.  Questions were answered to the patient's satisfaction.     Donato Heinz

## 2019-12-22 NOTE — Anesthesia Procedure Notes (Signed)
Procedure Name: MAC Date/Time: 12/22/2019 11:56 AM Performed by: Leonor Liv, CRNA Pre-anesthesia Checklist: Patient identified, Emergency Drugs available, Suction available and Patient being monitored Oxygen Delivery Method: Nasal cannula Placement Confirmation: positive ETCO2

## 2019-12-22 NOTE — Progress Notes (Signed)
PROGRESS NOTE  ADALEI NOVELL VEH:[id number] DOB: Apr 27, 1981 DOA: 12/17/2019 PCP: Binnie Rail, MD  Brief History   Marissa Joseph is a 39 y.o. female with medical history significant of morbid obesity, recurrent LLE cellulitis who presents to the ED with acute LLE pain and swelling that started the evening prior to visit. 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 No active drainage. No pruritis. Given worsening pain, pt presented to ED for further work up.   Of note, pt was admitted multiple times for recurrent LLE cellulitis years ago, historically treated with keflex as well as doxycycline  ED Course: In the ED, pt noted to be febrile with tmax of 101.69F with tachycardia. WBC noted to be 12.7k. Blood cx ordered and pending. Tib/fib xray ordered in ED with no acute destructive bony lesions noted with diffuse subcutaneous edema, reviewed. Pt was started on broad spectrum abx. Hospitalist consulted for consideration for admission.  [facility] were consulted to admit the patient for further evaluation and care.   The patient was admitted to a telemetry bed. She was started on IV Merrem and vancomycin. A blood cultures was obtained and demonstrated growth of staph species that is described as coagulase negative staph with oxacillin resistance. Blood cultures have been repeated and antibiotics have been narrowed to Vancomycin and rocephin. TTE was obtained and demonstrated possible vegetations on the tricuspid valve. TEE has been ordered to confirm. This will take place Monday 12/22/2019.  CT of the tib/fib and ankle has ruled out abscess. Infectious disease has been consulted. The patient has been continued on IV Rocephin.  TEE has demonstrated EF of 60-65% with normal function. There are no regional wall motion abnormalities. RV systolic function is mildly  reduced with enlargement of the right ventricle. No vegetation is seen.  Consultants   Infectious disease  Cardiology  Procedures   None  Antibiotics   Anti-infectives (From admission, onward)   Start     Dose/Rate Route Frequency Ordered Stop   12/22/19 1715  cephALEXin (KEFLEX) capsule 500 mg     Discontinue     500 mg Oral Every 8 hours 12/22/19 1622     12/19/19 1800  ceFAZolin (ANCEF) IVPB 2g/100 mL premix  Status:  Discontinued        2 g 200 mL/hr over 30 Minutes Intravenous Every 8 hours 12/19/19 1645 12/22/19 1622   12/18/19 1800  cefTRIAXone (ROCEPHIN) 2 g in sodium chloride 0.9 % 100 mL IVPB  Status:  Discontinued        2 g 200 mL/hr over 30 Minutes Intravenous Every 24 hours 12/18/19 1404 12/19/19 1644   12/18/19 0600  vancomycin (VANCOREADY) IVPB 1750 mg/350 mL  Status:  Discontinued        1,750 mg 175 mL/hr over 120 Minutes Intravenous Every 12 hours 12/17/19 1737 12/19/19 1644   12/17/19 2300  meropenem (MERREM) 1 g in sodium chloride 0.9 % 100 mL IVPB  Status:  Discontinued        1 g 200 mL/hr over 30 Minutes Intravenous Every 8 hours 12/17/19 1721 12/18/19 1404   12/17/19 1800  vancomycin (VANCOCIN) IVPB 1000 mg/200 mL premix  Status:  Discontinued        1,000 mg 200 mL/hr over 60 Minutes Intravenous  Once 12/17/19 1537 12/17/19 1644   12/17/19 1800  vancomycin (VANCOREADY) IVPB 2000 mg/400 mL  2,000 mg 200 mL/hr over 120 Minutes Intravenous  Once 12/17/19 1644 12/17/19 2255   12/17/19 1630  vancomycin (VANCOCIN) IVPB 1000 mg/200 mL premix  Status:  Discontinued        1,000 mg 200 mL/hr over 60 Minutes Intravenous  Once 12/17/19 1537 12/17/19 1644   12/17/19 1600  meropenem (MERREM) 1 g in sodium chloride 0.9 % 100 mL IVPB        1 g 200 mL/hr over 30 Minutes Intravenous  Once 12/17/19 1537 12/17/19 1855   12/17/19 1400  cefTRIAXone (ROCEPHIN) 2 g in sodium chloride 0.9 % 100 mL IVPB  Status:  Discontinued        2 g 200 mL/hr over 30 Minutes  Intravenous Every 24 hours 12/17/19 1354 12/17/19 1537     Subjective  The patient is sitting up in bed. No new complaints.  Objective   Vitals:  Vitals:   12/22/19 1245 12/22/19 1326  BP: (!) 145/95 (!) 143/103  Pulse: 98 (!) 104  Resp: 16   Temp:  99.5 F (37.5 C)  SpO2: 99% 96%  Exam:  Constitutional:   The patient is awake, alert, and oriented x 3. No acute distress. Respiratory:   No increased work of breathing.  No wheezes, rales, or rhonchi  No tactile fremitus Cardiovascular:   Regular rate and rhythm  No murmurs, ectopy, or gallups.  No lateral PMI. No thrills. Abdomen:   Abdomen is soft, non-tender, non-distended  Morbidly obese  Unable to evaluate the abdomen for hernias, masses, or organomegaly due to body habitus  Hypoactive bowel sounds.  Musculoskeletal:   No cyanosis, clubbing, or edema of the right lower extremity.  Left lower extremity is much less swollen today with wrinkling and also less warm. It is no longer tender to touch. Induration is improved. Skin:   No rashes, lesions, ulcers  palpation of skin: no induration or nodules  See above for abnormalities of left lower extremities. Neurologic:   CN 2-12 intact  Sensation all 4 extremities intact Psychiatric:   Mental status o Mood, affect appropriate o Orientation to person, place, time   judgment and insight appear intact  I have personally reviewed the following:   Today's Data   Vitals, BMP  Micro Data   Blood culture 1/1 positive for staph hominis and staph epididimis that was oxacillin resistant.  TEE negative for vegetations.  Repeat blood cultures: No growth  Imaging   CT left Tib/fib/ankle - rules out abscess.  Cardiology Data   TTE: Concern for vegetation of tricuspid valve.  TEE: has demonstrated EF of 60-65% with normal function. There are no regional wall motion abnormalities. RV systolic function is mildly reduced with enlargement of the right  ventricle. No vegetation is seen.  Scheduled Meds:  cephALEXin  500 mg Oral Q8H   enoxaparin (LOVENOX) injection  70 mg Subcutaneous Q24H   furosemide  20 mg Intravenous BID   gabapentin  200 mg Oral QHS   irbesartan  150 mg Oral Daily   And   hydrochlorothiazide  12.5 mg Oral Daily   mometasone-formoterol  2 puff Inhalation BID   sodium chloride flush  3 mL Intravenous Once   Continuous Infusions:   Principal Problem:   Recurrent cellulitis of lower extremity Active Problems:   Obstructive sleep apnea   Asthma   Chronic venous insufficiency of lower extremity   Osteoarthritis of left knee   Essential hypertension, benign   Depression   Primary osteoarthritis of right knee  Morbid obesity ([place])   LOS: 5 days   A & P  LLE cellulitis with sepsis present on admit: The patient received IV merrem and vanc initially. This has been changed to IV Rocephin and Vancomycin following results of growth of GPC in 1/1 blood culture. Pt states that there is improvement in the limb today, although it appears acutely infected to me. 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 CT of tib/fib and ankle has ruled out abscess. Will add lasix to decrease edema seen on CT.  Bacteremia: 1/1 blood cultures obtained on admission have grown out Coagulase negative staph. Ordinarily this would be dismissed as a contaminant. However, before the ID was made, the TTE was returned with concern for tricuspid vegetation. Will consult ID in the am. Also consider CT of the Thoracic spine as the patient was complaining of pain there this morning. Infectious disease has been consulted. The TEE has demonstrated EF of 60-65% with normal function. There are no regional wall motion abnormalities. RV systolic function is mildly reduced with enlargement of the right ventricle. No vegetation is seen.  Asthma: Noted and stable. Beta agonists are available on an as needed  basis.  HTN: The patient is normotensive on her home doses of avapro and HCTA. Monitor.  Depression: Noted.   Morbid obesity: Complicates all cares. Recommend diet and lifestyle modification as guided by her PCP.  Hx osteoarthritis: Noted and stable.  I have seen and examined this patient myself. I have spent 34 minutes in her evaluation and care.  DVT prophylaxis: Lovenox subq  Code Status: Full Family Communication: Pt in room, pt's boyfriend at bedside Disposition Plan: The patient is from home. Anticipate discharge to home. Barriers to discharge: Need to clarify significance of blood culture with TEE, repeat cultures, and ID consult. Need for IV antibiotics and to rule out abscess in the left lower extremity.  Status is: Inpatient  Remains inpatient appropriate because:Ongoing diagnostic testing needed not appropriate for outpatient work up, IV treatments appropriate due to intensity of illness or inability to take PO and Inpatient level of care appropriate due to severity of illness   Dispo: The patient is from: Home              Anticipated d/c is to: Home              Anticipated d/c date is: 2 days              Patient currently is not medically stable to d/c. Barriers to discharge include need for TEE and plan for home antibiotics pending outcome of TEE.  Keyan Folson, DO [facility] Direct contact: see [email or web address]  7PM-7AM contact night coverage as above 12/22/2019, 5:11 PM  LOS: 1 day

## 2019-12-22 NOTE — CV Procedure (Signed)
     TRANSESOPHAGEAL ECHOCARDIOGRAM   NAME:  Marissa Joseph   MRN: [id number] DOB:  09-18-1980   ADMIT DATE: 12/17/2019  INDICATIONS: Possible vegetation on TTE  PROCEDURE:   Informed consent was obtained prior to the procedure. The risks, benefits and alternatives for the procedure were discussed and the patient comprehended these risks.  Risks include, but are not limited to, cough, sore throat, vomiting, nausea, somnolence, esophageal and stomach trauma or perforation, bleeding, low blood pressure, aspiration, pneumonia, infection, trauma to the teeth and death.    After a procedural time-out, the oropharynx was anesthetized and the patient was sedated by the anesthesia service. The transesophageal probe was inserted in the esophagus and stomach without difficulty and multiple views were obtained. Anesthesia was monitored by Dr Gifford Shave and Trixie Deis, CRNA.    COMPLICATIONS:    There were no immediate complications.  FINDINGS:  No vegetation seen   Oswaldo Milian MD [facility]  [address], [place], [place] [address] [phone]   12:28 PM

## 2019-12-22 NOTE — Progress Notes (Signed)
  Echocardiogram Echocardiogram Transesophageal has been performed.  Marissa Joseph 12/22/2019, 12:41 PM

## 2019-12-22 NOTE — Transfer of Care (Signed)
Immediate Anesthesia Transfer of Care Note  Patient: Constance Whittle Haff  Procedure(s) Performed: TRANSESOPHAGEAL ECHOCARDIOGRAM (TEE) (N/A )  Patient Location: Endoscopy Unit  Anesthesia Type:MAC  Level of Consciousness: awake, alert  and oriented  Airway & Oxygen Therapy: Patient Spontanous Breathing  Post-op Assessment: Report given to RN, Post -op Vital signs reviewed and stable and Patient moving all extremities  Post vital signs: Reviewed and stable  Last Vitals:  Vitals Value Taken Time  BP 135/94 12/22/19 1227  Temp    Pulse 96 12/22/19 1228  Resp 23 12/22/19 1228  SpO2 86 % 12/22/19 1228  Vitals shown include unvalidated device data.  Last Pain:  Vitals:   12/22/19 1227  TempSrc:   PainSc: 0-No pain      Patients Stated Pain Goal: 3 (59/74/16 3845)  Complications: No complications documented.

## 2019-12-22 NOTE — Anesthesia Postprocedure Evaluation (Signed)
Anesthesia Post Note  Patient: Marissa Joseph  Procedure(s) Performed: TRANSESOPHAGEAL ECHOCARDIOGRAM (TEE) (N/A )     Patient location during evaluation: Endoscopy Anesthesia Type: MAC Level of consciousness: awake and alert Pain management: pain level controlled Vital Signs Assessment: post-procedure vital signs reviewed and stable Respiratory status: spontaneous breathing, nonlabored ventilation, respiratory function stable and patient connected to nasal cannula oxygen Cardiovascular status: stable and blood pressure returned to baseline Postop Assessment: no apparent nausea or vomiting Anesthetic complications: no   No complications documented.  Last Vitals:  Vitals:   12/22/19 1245 12/22/19 1326  BP: (!) 145/95 (!) 143/103  Pulse: 98 (!) 104  Resp: 16   Temp:  37.5 C  SpO2: 99% 96%    Last Pain:  Vitals:   12/22/19 1334  TempSrc:   PainSc: [place]

## 2019-12-22 NOTE — Progress Notes (Signed)
[facility]  Date of Admission:  12/17/2019   Total days of antibiotics 6        Day 4 cefazolin         ASSESSMENT: She has chronic venous stasis affecting her left leg with recurrent cellulitis.  This bout is improving.  She is now afebrile.  There was no evidence of vegetations seen on TEE today.  Change cefazolin to 4 cephalexin and recommend 4 more days of treatment.  I talked to him about strategies to prevent future bouts including skin care and compressive knee-high stockings on her left leg place first thing each morning.   PLAN: 1. Change IV cefazolin to oral cephalexin and treat for 4 more days 2. I will sign off now  Principal Problem:   Recurrent cellulitis of lower extremity Active Problems:   Chronic venous insufficiency of lower extremity   Obstructive sleep apnea   Asthma   Osteoarthritis of left knee   Essential hypertension, benign   Depression   Primary osteoarthritis of right knee   Morbid obesity (HCC)   Scheduled Meds: . enoxaparin (LOVENOX) injection  70 mg Subcutaneous Q24H  . furosemide  20 mg Intravenous BID  . gabapentin  200 mg Oral QHS  . irbesartan  150 mg Oral Daily   And  . hydrochlorothiazide  12.5 mg Oral Daily  . mometasone-formoterol  2 puff Inhalation BID  . sodium chloride flush  3 mL Intravenous Once   Continuous Infusions: .  ceFAZolin (ANCEF) IV 2 g (12/22/19 1330)  . potassium chloride 10 mEq (12/22/19 1530)   PRN Meds:.acetaminophen, albuterol, morphine injection, traMADol   SUBJECTIVE: She is still having pain in her left leg want to get out of bed.  However overall she is feeling better.  Review of Systems: Review of Systems  Constitutional: Negative for chills, diaphoresis and fever.  Musculoskeletal: Positive for joint pain.    Allergies  Allergen Reactions  . Avocado Other (See Comments)    Throat itching and swelling  . Shellfish Allergy Hives, Shortness Of Breath and  Swelling    OBJECTIVE: Vitals:   12/22/19 1230 12/22/19 1235 12/22/19 1245 12/22/19 1326  BP:  (!) 138/96 (!) 145/95 (!) 143/103  Pulse:  100 98 (!) 104  Resp:  (!) 29 16   Temp:    99.5 F (37.5 C)  TempSrc:    Oral  SpO2: 96% 99% 99% 96%  Weight:      Height:       Body mass index is 53.04 kg/m.  Physical Exam Constitutional:      Comments: She is resting quietly in bed talking on the phone with her mother.  Musculoskeletal:     Right lower leg: No edema.     Left lower leg: Edema present.  Skin:    Findings: Erythema present.     Comments: She has marking the skin of her left lower leg compatible with chronic venous stasis dermatitis.  Her acute swelling has decreased.  She still has some faint erythema.  Psychiatric:        Mood and Affect: Mood normal.     Lab Results Lab Results  Component Value Date   WBC 10.6 (H) 12/19/2019   HGB 11.0 (L) 12/19/2019   HCT 33.2 (L) 12/19/2019   MCV 93.3 12/19/2019   PLT 159 12/19/2019    Lab Results  Component Value Date   CREATININE 0.71 12/22/2019   BUN  12 12/22/2019   NA 137 12/22/2019   K 3.4 (L) 12/22/2019   CL 100 12/22/2019   CO2 26 12/22/2019    Lab Results  Component Value Date   ALT 29 12/20/2019   AST 28 12/20/2019   ALKPHOS 53 12/20/2019   BILITOT 0.7 12/20/2019     Microbiology: Recent Results (from the past 240 hour(s))  Urine culture     Status: None   Collection Time: 12/17/19  1:51 PM   Specimen: In/Out Cath Urine  Result Value Ref Range Status   Specimen Description   Final    IN/OUT CATH URINE Performed at [facility], [place] [address] [place], [place] [address]    Special Requests   Final    NONE Performed at [facility], [place] [address], [place], [place] [address]    Culture   Final    NO GROWTH Performed at [facility], [place] [address] [place], [place] [address]    Report Status 12/18/2019 FINAL  Final  Blood Culture (routine x 2)      Status: Abnormal   Collection Time: 12/17/19  2:25 PM   Specimen: BLOOD  Result Value Ref Range Status   Specimen Description   Final    BLOOD LEFT ANTECUBITAL Performed at [place] [address], [place], [place] [address]    Special Requests   Final    BOTTLES DRAWN AEROBIC AND ANAEROBIC Blood Culture adequate volume Performed at [place] [address] [place], [place] [address]    Culture  Setup Time   Final    GRAM POSITIVE COCCI IN CLUSTERS IN BOTH AEROBIC AND ANAEROBIC BOTTLES CRITICAL RESULT CALLED TO, READ BACK BY AND VERIFIED WITH: PHARMD C. GREEN [id number] FCP    Culture (A)  Final    STAPHYLOCOCCUS HOMINIS STAPHYLOCOCCUS EPIDERMIDIS THE SIGNIFICANCE OF ISOLATING THIS ORGANISM FROM A SINGLE SET OF BLOOD CULTURES WHEN MULTIPLE SETS ARE DRAWN IS UNCERTAIN. PLEASE NOTIFY THE MICROBIOLOGY DEPARTMENT WITHIN ONE WEEK IF SPECIATION AND SENSITIVITIES ARE REQUIRED. Performed at [facility], [place] [address], [place], [place] [address]    Report Status 12/20/2019 FINAL  Final  Blood Culture ID Panel (Reflexed)     Status: Abnormal   Collection Time: 12/17/19  2:25 PM  Result Value Ref Range Status   Enterococcus species NOT DETECTED NOT DETECTED Final   Listeria monocytogenes NOT DETECTED NOT DETECTED Final   Staphylococcus species DETECTED (A) NOT DETECTED Final    Comment: Methicillin (oxacillin) resistant coagulase negative staphylococcus. Possible blood culture contaminant (unless isolated from more than one blood culture draw or clinical case suggests pathogenicity). No antibiotic treatment is indicated for blood  culture contaminants. CRITICAL RESULT CALLED TO, READ BACK BY AND VERIFIED WITH: PHARMD C. GREEN [id number] FCP    Staphylococcus aureus (BCID) NOT DETECTED NOT DETECTED Final   Methicillin resistance DETECTED (A) NOT DETECTED Final    Comment: CRITICAL RESULT CALLED TO, READ BACK BY AND VERIFIED  WITH: PHARMD C. GREEN [phone] [id number] FCP    Streptococcus species NOT DETECTED NOT DETECTED Final   Streptococcus agalactiae NOT DETECTED NOT DETECTED Final   Streptococcus pneumoniae NOT DETECTED NOT DETECTED Final   Streptococcus pyogenes NOT DETECTED NOT DETECTED Final   Acinetobacter baumannii NOT DETECTED NOT DETECTED Final   Enterobacteriaceae species NOT DETECTED NOT DETECTED Final   Enterobacter cloacae complex NOT DETECTED NOT DETECTED Final   Escherichia coli NOT DETECTED NOT DETECTED Final  Klebsiella oxytoca NOT DETECTED NOT DETECTED Final   Klebsiella pneumoniae NOT DETECTED NOT DETECTED Final   Proteus species NOT DETECTED NOT DETECTED Final   Serratia marcescens NOT DETECTED NOT DETECTED Final   Haemophilus influenzae NOT DETECTED NOT DETECTED Final   Neisseria meningitidis NOT DETECTED NOT DETECTED Final   Pseudomonas aeruginosa NOT DETECTED NOT DETECTED Final   Candida albicans NOT DETECTED NOT DETECTED Final   Candida glabrata NOT DETECTED NOT DETECTED Final   Candida krusei NOT DETECTED NOT DETECTED Final   Candida parapsilosis NOT DETECTED NOT DETECTED Final   Candida tropicalis NOT DETECTED NOT DETECTED Final    Comment: Performed at [facility], [place] [address] [place], [place] [address]  Blood Culture (routine x 2)     Status: None   Collection Time: 12/17/19  2:36 PM   Specimen: BLOOD  Result Value Ref Range Status   Specimen Description   Final    BLOOD LEFT ANTECUBITAL Performed at [place] [address], [place], [place] [address]    Special Requests   Final    BOTTLES DRAWN AEROBIC AND ANAEROBIC Blood Culture adequate volume Performed at [place] [address] [place], [place] [address]    Culture   Final    NO GROWTH 5 DAYS Performed at [facility], [place] [address] [place], [place] [address]    Report Status 12/22/2019 FINAL  Final  SARS Coronavirus 2 by RT PCR (hospital order,  performed in [facility] hospital lab) Nasopharyngeal Nasopharyngeal Swab     Status: None   Collection Time: 12/17/19  3:24 PM   Specimen: Nasopharyngeal Swab  Result Value Ref Range Status   SARS Coronavirus 2 NEGATIVE NEGATIVE Final    Comment: (NOTE) SARS-CoV-2 target nucleic acids are NOT DETECTED.  The SARS-CoV-2 RNA is generally detectable in upper and lower respiratory specimens during the acute phase of infection. The lowest concentration of SARS-CoV-2 viral copies this assay can detect is 250 copies / mL. A negative result does not preclude SARS-CoV-2 infection and should not be used as the sole basis for treatment or other patient management decisions.  A negative result may occur with improper specimen collection / handling, submission of specimen other than nasopharyngeal swab, presence of viral mutation(s) within the areas targeted by this assay, and inadequate number of viral copies (<250 copies / mL). A negative result must be combined with clinical observations, patient history, and epidemiological information.  Fact Sheet for Patients:   StrictlyIdeas.no  Fact Sheet for Healthcare Providers: BankingDealers.co.za  This test is not yet approved or  cleared by the [place] FDA and has been authorized for detection and/or diagnosis of SARS-CoV-2 by FDA under an Emergency Use Authorization (EUA).  This EUA will remain in effect (meaning this test can be used) for the duration of the COVID-19 declaration under Section 564(b)(1) of the Act, 21 U.S.C. section 360bbb-3(b)(1), unless the authorization is terminated or revoked sooner.  Performed at [facility], [place] [address], [place], [place] [address]   Culture, blood (routine x 2)     Status: None (Preliminary result)   Collection Time: 12/21/19  9:32 AM   Specimen: BLOOD  Result Value Ref Range Status   Specimen Description   Final    BLOOD RIGHT  ANTECUBITAL Performed at [place] [address] [place],  [address]    Special Requests   Final    BOTTLES DRAWN AEROBIC ONLY  Blood Culture adequate volume Performed at [place] [address] [place], [place] [address]    Culture   Final    NO GROWTH 1 DAY Performed at [facility], [place] [address], [place], [place] [address]    Report Status PENDING  Incomplete  Culture, blood (routine x 2)     Status: None (Preliminary result)   Collection Time: 12/21/19  9:32 AM   Specimen: BLOOD  Result Value Ref Range Status   Specimen Description   Final    BLOOD RIGHT ANTECUBITAL Performed at [place] [address] [place], [place] [address]    Special Requests   Final    BOTTLES DRAWN AEROBIC AND ANAEROBIC Blood Culture adequate volume Performed at [place] [address], [place], [place] [address]    Culture   Final    NO GROWTH 1 DAY Performed at [facility], [place] [address], [place], [place] [address]    Report Status PENDING  Incomplete  MRSA PCR Screening     Status: None   Collection Time: 12/21/19  7:00 PM   Specimen: Nasopharyngeal  Result Value Ref Range Status   MRSA by PCR NEGATIVE NEGATIVE Final    Comment:        The GeneXpert MRSA Assay (FDA approved for NASAL specimens only), is one component of a comprehensive MRSA colonization surveillance program. It is not intended to diagnose MRSA infection nor to guide or monitor treatment for MRSA infections. Performed at [facility], [place] [address], [place], [place] [address]     Michel Bickers, [facility] [phone] pager   [phone] cell 12/22/2019, 4:18 PMPatient ID: Marissa Joseph, female   DOB: [birth year], 39 y.o.   MRN: [id number]

## 2019-12-22 NOTE — Anesthesia Preprocedure Evaluation (Addendum)
Anesthesia Evaluation  Patient identified by MRN, date of birth, ID band Patient awake    Reviewed: Allergy & Precautions, NPO status , Patient's Chart, lab work & pertinent test results  Airway Mallampati: II  TM Distance: >3 FB Neck ROM: Full    Dental  (+) Teeth Intact, Dental Advisory Given, Chipped,    Pulmonary asthma , sleep apnea , Current Smoker and Patient abstained from smoking.,    Pulmonary exam normal breath sounds clear to auscultation       Cardiovascular hypertension, Pt. on medications  Rhythm:Regular Rate:Tachycardia     Neuro/Psych PSYCHIATRIC DISORDERS Anxiety Depression negative neurological ROS     GI/Hepatic negative GI ROS, Neg liver ROS,   Endo/Other  Morbid obesity (BMI 53)  Renal/GU negative Renal ROS     Musculoskeletal  (+) Arthritis , LLE cellulitis with sepsis    Abdominal   Peds  Hematology  (+) Blood dyscrasia, anemia , BACTEREMIA   Anesthesia Other Findings Day of surgery medications reviewed with the patient.  Reproductive/Obstetrics negative OB ROS                            Anesthesia Physical Anesthesia Plan  ASA: IV  Anesthesia Plan: MAC   Post-op Pain Management:    Induction: Intravenous  PONV Risk Score and Plan: 1 and Propofol infusion and Treatment may vary due to age or medical condition  Airway Management Planned: Nasal Cannula and Natural Airway  Additional Equipment:   Intra-op Plan:   Post-operative Plan:   Informed Consent: I have reviewed the patients History and Physical, chart, labs and discussed the procedure including the risks, benefits and alternatives for the proposed anesthesia with the patient or authorized representative who has indicated his/her understanding and acceptance.     Dental advisory given  Plan Discussed with: CRNA and Anesthesiologist  Anesthesia Plan Comments:         Anesthesia Quick  Evaluation

## 2019-12-23 LAB — BASIC METABOLIC PANEL
Anion gap: 10 (ref 5–15)
BUN: 8 mg/dL (ref 6–20)
CO2: 24 mmol/L (ref 22–32)
Calcium: 8.2 mg/dL — ABNORMAL LOW (ref 8.9–10.3)
Chloride: 102 mmol/L (ref 98–111)
Creatinine, Ser: 0.69 mg/dL (ref 0.44–1.00)
GFR calc Af Amer: >60 mL/min (ref >60)
GFR calc non Af Amer: >60 mL/min (ref >60)
Glucose, Bld: 126 mg/dL — ABNORMAL HIGH (ref 70–99)
Potassium: 4.3 mmol/L (ref 3.5–5.1)
Sodium: 136 mmol/L (ref 135–145)

## 2019-12-23 MED ORDER — TRAMADOL HCL 50 MG PO TABS: 50.0000 mg | ORAL_TABLET | Freq: Four times a day (QID) | ORAL | 0 refills | Status: DC | PRN

## 2019-12-23 MED ORDER — FLUCONAZOLE 100 MG PO TABS: 100.0000 mg | ORAL_TABLET | Freq: Every day | ORAL | 0 refills | Status: DC

## 2019-12-23 MED ORDER — SODIUM CHLORIDE 0.9 % IV SOLN: INTRAVENOUS | Status: DC

## 2019-12-23 MED ORDER — FLUCONAZOLE 100 MG PO TABS
100.0000 mg | ORAL_TABLET | Freq: Every day | ORAL | Status: DC
  Administered 2019-12-23: 100 mg via ORAL
  Filled 2019-12-23: qty 1

## 2019-12-23 MED ORDER — CEPHALEXIN 500 MG PO CAPS: 500.0000 mg | ORAL_CAPSULE | Freq: Three times a day (TID) | ORAL | 0 refills | Status: AC

## 2019-12-23 MED ORDER — FUROSEMIDE 20 MG PO TABS
20.0000 mg | ORAL_TABLET | Freq: Every day | ORAL | 0 refills | Status: DC
Start: 2019-12-23 — End: 2020-01-27

## 2019-12-23 NOTE — Discharge Summary (Addendum)
Physician Discharge Summary  Marissa Joseph:[id number] DOB: 11/26/1980 DOA: 12/17/2019  PCP: Binnie Rail, MD  Admit date: 12/17/2019 Discharge date: 12/23/2019  Recommendations for Outpatient Follow-up:  Discharge to home Keep left lower extremity elevated as possible Follow up with PCP  in 7-10 days. Have chemistry drawn at that visit.   Discharge Diagnoses: Principal diagnosis is #1 Left lower extremity cellulitis Bacteremia Peripheral edema/Chronic venous insufficiency of lower extremity Essential Hypertension Depression Asthma Morbid Obesity Osteoartthritis Sepsis on admission, resolved.  Discharge Condition: Fair  Disposition: Home  Diet recommendation: Heart Healthy  Filed Weights   12/17/19 0520 12/22/19 1103  Weight: (!) 140.2 kg (!) 140.2 kg   History of present illness: Marissa Joseph is a 39 y.o. female with medical history significant of morbid obesity, recurrent LLE cellulitis who presents to the ED with acute LLE pain and swelling that started the evening prior to visit. 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 No active drainage. No pruritis. Given worsening pain, pt presented to ED for further work up.    Of note, pt was admitted multiple times for recurrent LLE cellulitis years ago, historically treated with keflex as well as doxycycline   ED Course: In the ED, pt noted to be febrile with tmax of 101.71F with tachycardia. WBC noted to be 12.7k. Blood cx ordered and pending. Tib/fib xray ordered in ED with no acute destructive bony lesions noted with diffuse subcutaneous edema, reviewed. Pt was started on broad spectrum abx. Hospitalist consulted for consideration for admission  Hospital Course:  The patient was admitted to a telemetry bed. She was started on IV Merrem and vancomycin. A blood cultures was obtained and  demonstrated growth of staph species that is described as coagulase negative staph with oxacillin resistance. Blood cultures have been repeated and antibiotics have been narrowed to Vancomycin and rocephin. TTE was obtained and demonstrated possible vegetations on the tricuspid valve. TEE has been ordered to confirm. This will take place Monday 12/22/2019.   CT of the tib/fib and ankle has ruled out abscess. Infectious disease has been consulted. The patient has been continued on IV Rocephin.   TEE has demonstrated EF of 60-65% with normal function. There are no regional wall motion abnormalities. RV systolic function is mildly reduced with enlargement of the right ventricle. No vegetation is seen. Per infectious disease the patient has been converted from Ancef to keflex. She will need to take for more days.   Today's assessment: S: The patient is resting comfortably. No new complaints. O: Vitals:  Vitals:   12/22/19 2037 12/23/19 0450  BP: 123/87 117/81  Pulse: 97 90  Resp: (!) 21 20  Temp: 99.5 F (37.5 C) 98.8 F (37.1 C)  SpO2: 96% 99%   Exam:  Constitutional:  The patient is awake, alert, and oriented x 3. No acute distress. Respiratory:  No increased work of breathing. No wheezes, rales, or rhonchi No tactile fremitus Cardiovascular:  Regular rate and rhythm No murmurs, ectopy, or gallups. No lateral PMI. No thrills. Abdomen:  Abdomen is soft, non-tender, non-distended No hernias, masses, or organomegaly Normoactive bowel sounds.  Musculoskeletal:  No cyanosis, clubbing, or edema Skin:  No rashes, lesions, ulcers palpation of skin: no induration or nodules Neurologic:  CN 2-12 intact Sensation all 4 extremities intact Psychiatric:  Mental status Mood, affect appropriate Orientation to person, place, time  judgment and insight  appear intact   Discharge Instructions  Discharge Instructions     Activity as tolerated - No restrictions   Complete by: As  directed    Call MD for:  redness, tenderness, or signs of infection (pain, swelling, redness, odor or green/yellow discharge around incision site)   Complete by: As directed    Call MD for:  temperature >100.4   Complete by: As directed    Diet - low sodium heart healthy   Complete by: As directed    Discharge instructions   Complete by: As directed    Discharge to home Keep left lower extremity elevated as possible Follow up with PCP  in 7-10 days. Have chemistry drawn at that visit.   Increase activity slowly   Complete by: As directed       Allergies as of 12/23/2019       Reactions   Avocado Other (See Comments)   Throat itching and swelling   Shellfish Allergy Hives, Shortness Of Breath, Swelling        Medication List     TAKE these medications    albuterol 108 (90 Base) MCG/ACT inhaler Commonly known as: VENTOLIN HFA Inhale 2 puffs into the lungs every 6 (six) hours as needed for wheezing.   budesonide-formoterol 80-4.5 MCG/ACT inhaler Commonly known as: SYMBICORT Inhale 2 puffs into the lungs 2 (two) times daily. Notes to patient: tonight   cephALEXin 500 MG capsule Commonly known as: KEFLEX Take 1 capsule (500 mg total) by mouth every 8 (eight) hours for 4 days. Notes to patient: Today at 2pm and 10pm   diclofenac Sodium 1 % Gel Commonly known as: Voltaren Apply 4 g topically 4 (four) times daily as needed.   fluconazole 100 MG tablet Commonly known as: DIFLUCAN Take 1 tablet (100 mg total) by mouth daily.   furosemide 20 MG tablet Commonly known as: Lasix Take 1 tablet (20 mg total) by mouth daily.   gabapentin 100 MG capsule Commonly known as: NEURONTIN Take 2 capsules (200 mg total) by mouth at bedtime.   montelukast 10 MG tablet Commonly known as: SINGULAIR Take 1 tablet (10 mg total) by mouth at bedtime.   olmesartan-hydrochlorothiazide 20-12.5 MG tablet Commonly known as: BENICAR HCT Take 1 tablet by mouth daily.   traMADol 50 MG  tablet Commonly known as: ULTRAM Take 1 tablet (50 mg total) by mouth every 6 (six) hours as needed for moderate pain or severe pain.       Allergies  Allergen Reactions   Avocado Other (See Comments)    Throat itching and swelling   Shellfish Allergy Hives, Shortness Of Breath and Swelling    The results of significant diagnostics from this hospitalization (including imaging, microbiology, ancillary and laboratory) are listed below for reference.    Significant Diagnostic Studies: DG Tibia/Fibula Left  Result Date: 12/17/2019 CLINICAL DATA:  Cellulitis, pain EXAM: LEFT TIBIA AND FIBULA - 2 VIEW COMPARISON:  06/21/2013 FINDINGS: Frontal and lateral views of the left tibia and fibula are obtained. No acute or destructive bony lesions. Progressive moderate left knee osteoarthritis. There is diffuse subcutaneous edema. IMPRESSION: 1. Diffuse subcutaneous edema. 2. No acute or destructive bony lesion. 3. Left knee osteoarthritis. Electronically Signed   By: Randa Ngo M.D.   On: 12/17/2019 15:39   CT TIBIA FIBULA LEFT W CONTRAST  Result Date: 12/19/2019 CLINICAL DATA:  Left lower extremity pain, cellulitis EXAM: CT OF THE LEFT TIBIA-FIBULA AND ANKLE WITH CONTRAST TECHNIQUE: Multidetector CT imaging of the left  lower extremity was performed following the standard protocol during bolus administration of intravenous contrast. Field of view is from the left knee through the left ankle. CONTRAST:  193mL OMNIPAQUE IOHEXOL 300 MG/ML  SOLN COMPARISON:  X-ray 12/17/2019 FINDINGS: Bones/Joint/Cartilage No acute fracture. No dislocation. No bony erosion or periostitis. Moderate tricompartmental osteoarthritis of the left knee manifested joint space narrowing, subchondral sclerosis, and prominent marginal osteophyte formation. Degree of arthropathy is advanced for age. Small left knee joint effusion. The left ankle and hindfoot are anatomic Lea aligned without dislocation. Mild-to-moderate arthropathy of  the left tibiotalar joint with anterior osteophytic spurring. Mild degenerative changes of the posterior subtalar joint and within the midfoot. No appreciable tibiotalar or subtalar joint effusion. Ligaments Suboptimally assessed by CT. Muscles and Tendons Preserved muscle bulk without atrophy or fatty infiltration. No intramuscular fluid collection. Included tendinous structures appear intact within the limitations of CT. Soft tissues Circumferential subcutaneous edema of the left lower leg which is most pronounced in the region of the mid to distal tibial diaphysis, particularly along the medial aspect. No organized or rim enhancing fluid collection. No soft tissue gas. No discrete soft tissue ulceration. IMPRESSION: 1. Circumferential subcutaneous edema of the left lower leg which is most pronounced in the region of the mid to distal tibial diaphysis, particularly along the medial aspect. No organized or rim-enhancing fluid collection. No soft tissue gas. Findings are nonspecific but can be seen in the setting of cellulitis. 2. No acute osseous findings.  No evidence of osteomyelitis. 3. Degenerative changes including moderate tricompartmental osteoarthritis of the left knee. The degree of arthropathy is advanced for age. 4. Small left knee joint effusion, nonspecific. Electronically Signed   By: Davina Poke D.O.   On: 12/19/2019 08:15   CT ANKLE LEFT W CONTRAST  Result Date: 12/19/2019 CLINICAL DATA:  Left lower extremity pain, cellulitis EXAM: CT OF THE LEFT TIBIA-FIBULA AND ANKLE WITH CONTRAST TECHNIQUE: Multidetector CT imaging of the left lower extremity was performed following the standard protocol during bolus administration of intravenous contrast. Field of view is from the left knee through the left ankle. CONTRAST:  16mL OMNIPAQUE IOHEXOL 300 MG/ML  SOLN COMPARISON:  X-ray 12/17/2019 FINDINGS: Bones/Joint/Cartilage No acute fracture. No dislocation. No bony erosion or periostitis. Moderate  tricompartmental osteoarthritis of the left knee manifested joint space narrowing, subchondral sclerosis, and prominent marginal osteophyte formation. Degree of arthropathy is advanced for age. Small left knee joint effusion. The left ankle and hindfoot are anatomic Lea aligned without dislocation. Mild-to-moderate arthropathy of the left tibiotalar joint with anterior osteophytic spurring. Mild degenerative changes of the posterior subtalar joint and within the midfoot. No appreciable tibiotalar or subtalar joint effusion. Ligaments Suboptimally assessed by CT. Muscles and Tendons Preserved muscle bulk without atrophy or fatty infiltration. No intramuscular fluid collection. Included tendinous structures appear intact within the limitations of CT. Soft tissues Circumferential subcutaneous edema of the left lower leg which is most pronounced in the region of the mid to distal tibial diaphysis, particularly along the medial aspect. No organized or rim enhancing fluid collection. No soft tissue gas. No discrete soft tissue ulceration. IMPRESSION: 1. Circumferential subcutaneous edema of the left lower leg which is most pronounced in the region of the mid to distal tibial diaphysis, particularly along the medial aspect. No organized or rim-enhancing fluid collection. No soft tissue gas. Findings are nonspecific but can be seen in the setting of cellulitis. 2. No acute osseous findings.  No evidence of osteomyelitis. 3. Degenerative changes including moderate tricompartmental  osteoarthritis of the left knee. The degree of arthropathy is advanced for age. 4. Small left knee joint effusion, nonspecific. Electronically Signed   By: Davina Poke D.O.   On: 12/19/2019 08:15   ECHOCARDIOGRAM COMPLETE  Result Date: 12/18/2019    ECHOCARDIOGRAM REPORT   Patient Name:   Marissa Joseph Date of Exam: 12/18/2019 Medical Rec #:  [id number]           Height:       64.0 in Accession #:    [id number]          Weight:        309.0 lb Date of Birth:  1980/11/16          BSA:          2.354 m Patient Age:    70 years            BP:           134/72 mmHg Patient Gender: F                   HR:           102 bpm. Exam Location:  Inpatient Procedure: 2D Echo, Cardiac Doppler and Color Doppler Indications:    Bacteremia 790.7 / R78.81  History:        Patient has no prior history of Echocardiogram examinations.                 Signs/Symptoms:Murmur; Risk Factors:Hypertension. MRSA.  Sonographer:    Jonelle Sidle Dance Referring Phys: [id number] Cyncere Sontag IMPRESSIONS  1. Left ventricular ejection fraction, by estimation, is 65 to 70%. The left ventricle has normal function. The left ventricle has no regional wall motion abnormalities. There is mild left ventricular hypertrophy. Left ventricular diastolic parameters were normal.  2. Right ventricular systolic function is normal. The right ventricular size is normal. Tricuspid regurgitation signal is inadequate for assessing PA pressure.  3. The mitral valve is normal in structure. No evidence of mitral valve regurgitation.  4. The aortic valve was not well visualized. Aortic valve regurgitation is not visualized. No aortic stenosis is present.  5. Appears to be a small echodensity on septal leaflet of tricuspid valve, seen on RV inflow view (images 16 and 91). Not seen on other views. No significant tricuspid regurgitation. Recommend TEE for further evaluation. FINDINGS  Left Ventricle: Left ventricular ejection fraction, by estimation, is 65 to 70%. The left ventricle has normal function. The left ventricle has no regional wall motion abnormalities. The left ventricular internal cavity size was normal in size. There is  mild left ventricular hypertrophy. Left ventricular diastolic parameters were normal. Right Ventricle: The right ventricular size is normal. Right vetricular wall thickness was not assessed. Right ventricular systolic function is normal. Tricuspid regurgitation signal is inadequate for  assessing PA pressure. Left Atrium: Left atrial size was normal in size. Right Atrium: Right atrial size was normal in size. Pericardium: There is no evidence of pericardial effusion. Mitral Valve: The mitral valve is normal in structure. No evidence of mitral valve regurgitation. Tricuspid Valve: Appears to be an echodensity on septal leaflet of tricuspid valve, best seen on RV inflow view. No significant regurgitation. Recommend TEE for further evaluation. The tricuspid valve is normal in structure. Tricuspid valve regurgitation  is trivial. Aortic Valve: The aortic valve was not well visualized. Aortic valve regurgitation is not visualized. No aortic stenosis is present. Pulmonic Valve: The pulmonic valve was not well visualized. Pulmonic valve regurgitation  is trivial. Aorta: The aortic root and ascending aorta are structurally normal, with no evidence of dilitation. IAS/Shunts: The interatrial septum was not well visualized.  LEFT VENTRICLE PLAX 2D LVIDd:         4.70 cm  Diastology LVIDs:         2.70 cm  LV e' lateral:   23.30 cm/s LV PW:         1.00 cm  LV E/e' lateral: 5.3 LV IVS:        1.10 cm  LV e' medial:    12.70 cm/s LVOT diam:     1.80 cm  LV E/e' medial:  9.8 LV SV:         63 LV SV Index:   27 LVOT Area:     2.54 cm  RIGHT VENTRICLE             IVC RV Basal diam:  3.00 cm     IVC diam: 1.80 cm RV Mid diam:    2.20 cm RV S prime:     12.70 cm/s TAPSE (M-mode): 2.7 cm LEFT ATRIUM             Index       RIGHT ATRIUM           Index LA diam:        3.90 cm 1.66 cm/m  RA Area:     13.10 cm LA Vol (A2C):   47.0 ml 19.97 ml/m RA Volume:   33.90 ml  14.40 ml/m LA Vol (A4C):   49.3 ml 20.95 ml/m LA Biplane Vol: 52.1 ml 22.14 ml/m  AORTIC VALVE LVOT Vmax:   149.00 cm/s LVOT Vmean:  98.400 cm/s LVOT VTI:    0.248 m  AORTA Ao Root diam: 3.30 cm MITRAL VALVE MV Area (PHT): 3.91 cm     SHUNTS MV Decel Time: 194 msec     Systemic VTI:  0.25 m MV E velocity: 124.00 cm/s  Systemic Diam: 1.80 cm MV A  velocity: 90.70 cm/s MV E/A ratio:  1.37 Oswaldo Milian MD Electronically signed by Oswaldo Milian MD Signature Date/Time: 12/18/2019/4:08:46 PM    Final    ECHO TEE  Result Date: 12/22/2019    TRANSESOPHOGEAL ECHO REPORT   Patient Name:   Marissa Joseph Date of Exam: 12/22/2019 Medical Rec #:  [id number]           Height:       64.0 in Accession #:    [id number]          Weight:       309.0 lb Date of Birth:  May 15, 1981          BSA:          2.354 m Patient Age:    36 years            BP:           135/94 mmHg Patient Gender: F                   HR:           102 bpm. Exam Location:  Inpatient Procedure: Transesophageal Echo, Cardiac Doppler and Color Doppler Indications:     Bacteremia 790.7 / R78.81  History:         Patient has prior history of Echocardiogram examinations, most                  recent 12/18/2019. Risk Factors:Hypertension, Sleep Apnea  and                  Current Smoker.  Sonographer:     Clayton Lefort RDCS (AE) Referring Phys:  [id number] Karie Kirks Diagnosing Phys: Oswaldo Milian MD PROCEDURE: After discussion of the risks and benefits of a TEE, an informed consent was obtained. The transesophogeal probe was passed without difficulty through the esophogus of the patient. Local oropharyngeal anesthetic was provided with Cetacaine. Sedation performed by different physician. The patient was monitored while under deep sedation. Anesthestetic sedation was provided intravenously by Anesthesiology: 439.47mg  of Propofol, 60mg  of Lidocaine. Image quality was adequate. The patient developed no complications during the procedure. IMPRESSIONS  1. Left ventricular ejection fraction, by estimation, is 60 to 65%. The left ventricle has normal function. The left ventricle has no regional wall motion abnormalities. There is mild left ventricular hypertrophy.  2. Right ventricular systolic function is mildly reduced. The right ventricular size is mildly enlarged.  3. No left atrial/left atrial  appendage thrombus was detected.  4. Right atrial size was mildly dilated.  5. The mitral valve is normal in structure. Trivial mitral valve regurgitation.  6. The aortic valve is tricuspid. Aortic valve regurgitation is not visualized. No aortic stenosis is present.  7. No vegetation seen FINDINGS  Left Ventricle: Left ventricular ejection fraction, by estimation, is 60 to 65%. The left ventricle has normal function. The left ventricle has no regional wall motion abnormalities. The left ventricular internal cavity size was normal in size. There is  mild left ventricular hypertrophy. Right Ventricle: The right ventricular size is mildly enlarged. Right vetricular wall thickness was not assessed. Right ventricular systolic function is mildly reduced. Left Atrium: Left atrial size was normal in size. No left atrial/left atrial appendage thrombus was detected. Right Atrium: Right atrial size was mildly dilated. Pericardium: There is no evidence of pericardial effusion. Mitral Valve: The mitral valve is normal in structure. Trivial mitral valve regurgitation. Tricuspid Valve: The tricuspid valve is normal in structure. Tricuspid valve regurgitation is trivial. Aortic Valve: The aortic valve is tricuspid. Aortic valve regurgitation is not visualized. No aortic stenosis is present. Pulmonic Valve: The pulmonic valve was grossly normal. Pulmonic valve regurgitation is trivial. Aorta: The aortic root and ascending aorta are structurally normal, with no evidence of dilitation. IAS/Shunts: No atrial level shunt detected by color flow Doppler. Oswaldo Milian MD Electronically signed by Oswaldo Milian MD Signature Date/Time: 12/22/2019/1:04:41 PM    Final     Microbiology: Recent Results (from the past 240 hour(s))  Urine culture     Status: None   Collection Time: 12/17/19  1:51 PM   Specimen: In/Out Cath Urine  Result Value Ref Range Status   Specimen Description   Final    IN/OUT CATH URINE Performed at  [facility], [place] [address] [place], [place] [address]    Special Requests   Final    NONE Performed at [facility], [place] [address] [place], [place] [address]    Culture   Final    NO GROWTH Performed at [facility], [place] [address] [place], [place] [address]    Report Status 12/18/2019 FINAL  Final  Blood Culture (routine x 2)     Status: Abnormal   Collection Time: 12/17/19  2:25 PM   Specimen: BLOOD  Result Value Ref Range Status   Specimen Description   Final    BLOOD LEFT ANTECUBITAL Performed at [place]  [address], [place], [place] [address]    Special Requests   Final    BOTTLES DRAWN AEROBIC AND ANAEROBIC Blood Culture adequate volume Performed at [place] [address], [place], [place] [address]    Culture  Setup Time   Final    GRAM POSITIVE COCCI IN CLUSTERS IN BOTH AEROBIC AND ANAEROBIC BOTTLES CRITICAL RESULT CALLED TO, READ BACK BY AND VERIFIED WITH: PHARMD C. GREEN [id number] FCP    Culture (A)  Final    STAPHYLOCOCCUS HOMINIS STAPHYLOCOCCUS EPIDERMIDIS THE SIGNIFICANCE OF ISOLATING THIS ORGANISM FROM A SINGLE SET OF BLOOD CULTURES WHEN MULTIPLE SETS ARE DRAWN IS UNCERTAIN. PLEASE NOTIFY THE MICROBIOLOGY DEPARTMENT WITHIN ONE WEEK IF SPECIATION AND SENSITIVITIES ARE REQUIRED. Performed at [facility], [place] [address], [place], [place] [address]    Report Status 12/20/2019 FINAL  Final  Blood Culture ID Panel (Reflexed)     Status: Abnormal   Collection Time: 12/17/19  2:25 PM  Result Value Ref Range Status   Enterococcus species NOT DETECTED NOT DETECTED Final   Listeria monocytogenes NOT DETECTED NOT DETECTED Final   Staphylococcus species DETECTED (A) NOT DETECTED Final    Comment: Methicillin (oxacillin) resistant coagulase negative staphylococcus. Possible blood culture contaminant (unless isolated from more than one blood culture draw or clinical  case suggests pathogenicity). No antibiotic treatment is indicated for blood  culture contaminants. CRITICAL RESULT CALLED TO, READ BACK BY AND VERIFIED WITH: PHARMD C. GREEN [id number] FCP    Staphylococcus aureus (BCID) NOT DETECTED NOT DETECTED Final   Methicillin resistance DETECTED (A) NOT DETECTED Final    Comment: CRITICAL RESULT CALLED TO, READ BACK BY AND VERIFIED WITH: PHARMD C. GREEN [id number] FCP    Streptococcus species NOT DETECTED NOT DETECTED Final   Streptococcus agalactiae NOT DETECTED NOT DETECTED Final   Streptococcus pneumoniae NOT DETECTED NOT DETECTED Final   Streptococcus pyogenes NOT DETECTED NOT DETECTED Final   Acinetobacter baumannii NOT DETECTED NOT DETECTED Final   Enterobacteriaceae species NOT DETECTED NOT DETECTED Final   Enterobacter cloacae complex NOT DETECTED NOT DETECTED Final   Escherichia coli NOT DETECTED NOT DETECTED Final   Klebsiella oxytoca NOT DETECTED NOT DETECTED Final   Klebsiella pneumoniae NOT DETECTED NOT DETECTED Final   Proteus species NOT DETECTED NOT DETECTED Final   Serratia marcescens NOT DETECTED NOT DETECTED Final   Haemophilus influenzae NOT DETECTED NOT DETECTED Final   Neisseria meningitidis NOT DETECTED NOT DETECTED Final   Pseudomonas aeruginosa NOT DETECTED NOT DETECTED Final   Candida albicans NOT DETECTED NOT DETECTED Final   Candida glabrata NOT DETECTED NOT DETECTED Final   Candida krusei NOT DETECTED NOT DETECTED Final   Candida parapsilosis NOT DETECTED NOT DETECTED Final   Candida tropicalis NOT DETECTED NOT DETECTED Final    Comment: Performed at [facility], [place]. [address], [place], [place] [address]  Blood Culture (routine x 2)     Status: None   Collection Time: 12/17/19  2:36 PM   Specimen: BLOOD  Result Value Ref Range Status   Specimen Description   Final    BLOOD LEFT ANTECUBITAL Performed at [place] [address], [place], [place] [address]    Special Requests    Final    BOTTLES DRAWN AEROBIC AND ANAEROBIC Blood Culture adequate volume Performed at [place] [address] [place], [place] [address]    Culture   Final    NO GROWTH 5 DAYS Performed  at [facility], [place] [address], [place], [place] [address]    Report Status 12/22/2019 FINAL  Final  SARS Coronavirus 2 by RT PCR (hospital order, performed in [facility] hospital lab) Nasopharyngeal Nasopharyngeal Swab     Status: None   Collection Time: 12/17/19  3:24 PM   Specimen: Nasopharyngeal Swab  Result Value Ref Range Status   SARS Coronavirus 2 NEGATIVE NEGATIVE Final    Comment: (NOTE) SARS-CoV-2 target nucleic acids are NOT DETECTED.  The SARS-CoV-2 RNA is generally detectable in upper and lower respiratory specimens during the acute phase of infection. The lowest concentration of SARS-CoV-2 viral copies this assay can detect is 250 copies / mL. A negative result does not preclude SARS-CoV-2 infection and should not be used as the sole basis for treatment or other patient management decisions.  A negative result may occur with improper specimen collection / handling, submission of specimen other than nasopharyngeal swab, presence of viral mutation(s) within the areas targeted by this assay, and inadequate number of viral copies (<250 copies / mL). A negative result must be combined with clinical observations, patient history, and epidemiological information.  Fact Sheet for Patients:   StrictlyIdeas.no  Fact Sheet for Healthcare Providers: BankingDealers.co.za  This test is not yet approved or  cleared by the [place] FDA and has been authorized for detection and/or diagnosis of SARS-CoV-2 by FDA under an Emergency Use Authorization (EUA).  This EUA will remain in effect (meaning this test can be used) for the duration of the COVID-19 declaration under Section 564(b)(1) of the Act, 21  U.S.C. section 360bbb-3(b)(1), unless the authorization is terminated or revoked sooner.  Performed at [facility], [place] [address], [place], [place] [address]   Culture, blood (routine x 2)     Status: None (Preliminary result)   Collection Time: 12/21/19  9:32 AM   Specimen: BLOOD  Result Value Ref Range Status   Specimen Description   Final    BLOOD RIGHT ANTECUBITAL Performed at [facility] [address] [place], [place] [address]    Special Requests   Final    BOTTLES DRAWN AEROBIC ONLY Blood Culture adequate volume Performed at [facility] [address], [place], [place] [address]    Culture   Final    NO GROWTH 2 DAYS Performed at [facility] [address] [place], [place] [address]    Report Status PENDING  Incomplete  Culture, blood (routine x 2)     Status: None (Preliminary result)   Collection Time: 12/21/19  9:32 AM   Specimen: BLOOD  Result Value Ref Range Status   Specimen Description   Final    BLOOD RIGHT ANTECUBITAL Performed at [facility] [address], [place], [place] [address]    Special Requests   Final    BOTTLES DRAWN AEROBIC AND ANAEROBIC Blood Culture adequate volume Performed at [facility] [address] [place], [place] [address]    Culture   Final    NO GROWTH 2 DAYS Performed at [facility] [address] [place], [place] [address]    Report Status PENDING  Incomplete  MRSA PCR Screening     Status: None   Collection Time: 12/21/19  7:00 PM   Specimen: Nasopharyngeal  Result Value Ref Range Status   MRSA by PCR NEGATIVE NEGATIVE Final    Comment:        The GeneXpert MRSA  Assay (FDA approved for NASAL specimens only), is one component of a comprehensive MRSA colonization surveillance program. It is not intended to diagnose MRSA infection nor to guide or monitor treatment for MRSA infections. Performed at [facility], [place] [address] [place],  [address]      Labs: Basic Metabolic Panel: Recent Labs  Lab 12/19/19 0500 12/20/19 0607 12/21/19 0634 12/22/19 0549 12/23/19 0546  NA 134* 134* 137 137 136  K 3.0* 3.0* 3.2* 3.4* 4.3  CL 102 102 103 100 102  CO2 23 24 25 26 24   GLUCOSE 116* 128* 119* 99 126*  BUN 9 6 7 12 8   CREATININE 0.71 0.68 0.66 0.71 0.69  CALCIUM 7.9* 8.0* 8.2* 8.3* 8.2*   Liver Function Tests: Recent Labs  Lab 12/17/19 0538 12/18/19 1016 12/19/19 0500 12/20/19 0607  AST 18 19 21 28   ALT 16 15 20 29   ALKPHOS 65 50 47 53  BILITOT 0.4 0.9 0.6 0.7  PROT 7.5 6.9 6.7 6.8  ALBUMIN 3.6 2.9* 2.9* 2.7*   No results for input(s): LIPASE, AMYLASE in the last 168 hours. No results for input(s): AMMONIA in the last 168 hours. CBC: Recent Labs  Lab 12/17/19 0538 12/18/19 1016 12/19/19 0500  WBC 12.7* 11.5* 10.6*  NEUTROABS 11.5* 10.3* 9.0*  HGB 13.1 12.0 11.0*  HCT 39.0 37.0 33.2*  MCV 95.6 95.6 93.3  PLT 213 171 159   Cardiac Enzymes: No results for input(s): CKTOTAL, CKMB, CKMBINDEX, TROPONINI in the last 168 hours. BNP: BNP (last 3 results) No results for input(s): BNP in the last 8760 hours.  ProBNP (last 3 results) No results for input(s): PROBNP in the last 8760 hours.  CBG: No results for input(s): GLUCAP in the last 168 hours.  Principal Problem:   Recurrent cellulitis of lower extremity Active Problems:   Obstructive sleep apnea   Asthma   Chronic venous insufficiency of lower extremity   Osteoarthritis of left knee   Essential hypertension, benign   Depression   Primary osteoarthritis of right knee   Morbid obesity ([place])   Bacteremia   Time coordinating discharge: 38 minutes  Signed:        Berlene Dixson, DO [facility]  12/23/2019, 3:46 PM

## 2019-12-23 NOTE — Progress Notes (Signed)
Went over discharge papers with patient and boyfriend.  All questions answered.  Prescription and AVS given to patient.  IV removed.

## 2019-12-24 ENCOUNTER — Encounter (HOSPITAL_COMMUNITY): Payer: Self-pay | Admitting: Cardiology

## 2019-12-24 ENCOUNTER — Encounter: Payer: Self-pay | Admitting: Internal Medicine

## 2019-12-25 ENCOUNTER — Telehealth: Payer: Self-pay | Admitting: *Deleted

## 2019-12-25 NOTE — Telephone Encounter (Signed)
Called pt concerning hosp f/u there was no answer LMOM RTC.Marland KitchenJohny Chess

## 2019-12-26 LAB — CULTURE, BLOOD (ROUTINE X 2)
Culture: NO GROWTH
Culture: NO GROWTH
Special Requests: ADEQUATE
Special Requests: ADEQUATE

## 2019-12-28 ENCOUNTER — Encounter: Payer: Self-pay | Admitting: Internal Medicine

## 2019-12-31 NOTE — Progress Notes (Signed)
Subjective:    Patient ID: Marissa Joseph, female    DOB: 1981-03-25, 39 y.o.   MRN: [id number]  HPI The patient is here for follow up from the hospital.  She was admitted 7/14-7/20 for cellulitis.  She went for leg pain. She has a hx of recurrent LLE cellulitis.  She had LLE pain and swelling that started the night prior.  She possibly scarped her leg.  There was no drainage. She was febrile - 101.3, tachycardic, elevated WBC.  Xray w/o fx.      LLE cellulitis with sepsis: Started on Vanc, meropenem and changed to vanc plus rocephin TTE showed possible vegetations on tricuspid valve CT of tib/fib and ankle - no abscess TEE nml EF, RV slightly reduced with enlargement of RVno vegetation Ancef changed to keflex    She completed the antibiotic last Friday or Saturday.   The area peeled.  She took a bath and put epsom salts in it.  After her bath it peeled more and it opened up.  It has been draining a little.  She has been airing it out and it has started to scab.  It stings, especially when she has her leg down.  It is hard for to have her leg down a long time.  The whole area itches a lot.  She denies any true pain except for the same.  She has not had any fevers.  She does state that she feels okay.  She does not feel sick.  She tried to go to work Tuesday but could not sit there too long.     Medications and allergies reviewed with patient and updated if appropriate.  Patient Active Problem List   Diagnosis Date Noted  . Bacteremia   . Recurrent cellulitis of lower extremity 12/17/2019  . Primary osteoarthritis of right knee 07/29/2019  . Acute lateral meniscus tear of right knee 07/29/2019  . Acute medial meniscus tear of right knee 07/29/2019  . Body mass index 50.0-59.9, adult ([place]) 07/29/2019  . Morbid obesity ([place]) 07/29/2019  . Muscular pain 11/13/2018  . Scapular dyskinesis 10/07/2018  . Acute right-sided thoracic back pain 09/25/2018  . Depression 05/10/2018   . Amenorrhea 10/10/2017  . Ventral hernia without obstruction or gangrene 07/13/2017  . Hearing difficulty 11/24/2016  . Prediabetes 09/06/2015  . Essential hypertension, benign 09/06/2015  . Smoking 12/22/2013  . Osteoarthritis of left knee 08/29/2013  . Pap smear of cervix with ASCUS, cannot exclude HGSIL 06/25/2013  . Fibroid uterus 05/22/2013  . Dysmenorrhea 05/22/2013  . Monilial intertrigo 01/06/2013  . ALLERGIC RHINITIS 04/10/2008  . HEEL PAIN, LEFT 11/22/2007  . Chronic venous insufficiency of lower extremity 08/06/2007  . Obstructive sleep apnea 03/25/2007  . Eczema 02/21/2007  . OBESITY NOS 01/21/2007  . Asthma 01/21/2007    Current Outpatient Medications on File Prior to Visit  Medication Sig Dispense Refill  . albuterol (VENTOLIN HFA) 108 (90 Base) MCG/ACT inhaler Inhale 2 puffs into the lungs every 6 (six) hours as needed for wheezing. 1 Inhaler 8  . budesonide-formoterol (SYMBICORT) 80-4.5 MCG/ACT inhaler Inhale 2 puffs into the lungs 2 (two) times daily. 1 Inhaler 3  . diclofenac Sodium (VOLTAREN) 1 % GEL Apply 4 g topically 4 (four) times daily as needed. 150 g 0  . furosemide (LASIX) 20 MG tablet Take 1 tablet (20 mg total) by mouth daily. 30 tablet 0  . gabapentin (NEURONTIN) 100 MG capsule Take 2 capsules (200 mg total) by mouth at bedtime.  60 capsule 3  . montelukast (SINGULAIR) 10 MG tablet Take 1 tablet (10 mg total) by mouth at bedtime. 90 tablet 1  . olmesartan-hydrochlorothiazide (BENICAR HCT) 20-12.5 MG tablet Take 1 tablet by mouth daily. 90 tablet 1  . traMADol (ULTRAM) 50 MG tablet Take 1 tablet (50 mg total) by mouth every 6 (six) hours as needed for moderate pain or severe pain. 20 tablet 0   No current facility-administered medications on file prior to visit.    Past Medical History:  Diagnosis Date  . Anxiety    no official dx  . Asthma   . Eczema   . Fibroids   . GONORRHEA 03/25/2008   Qualifier: Diagnosis of  By: Isla Pence    .  Heart murmur   . Heart murmur   . Hypertension   . MRSA (methicillin resistant staph aureus) culture positive 05/2012   cellulits/ left leg  . Obesity   . Sleep apnea     Past Surgical History:  Procedure Laterality Date  . NO PAST SURGERIES    . TEE WITHOUT CARDIOVERSION N/A 12/22/2019   Procedure: TRANSESOPHAGEAL ECHOCARDIOGRAM (TEE);  Surgeon: Donato Heinz, MD;  Location: [facility] ENDOSCOPY;  Service: Cardiovascular;  Laterality: N/A;    Social History   Socioeconomic History  . Marital status: Single    Spouse name: Not on file  . Number of children: Not on file  . Years of education: Not on file  . Highest education level: Not on file  Occupational History  . Not on file  Tobacco Use  . Smoking status: Current Every Day Smoker    Packs/day: 0.20    Years: 5.00    Pack years: 1.00    Types: Cigarettes  . Smokeless tobacco: Never Used  Vaping Use  . Vaping Use: Every day  Substance and Sexual Activity  . Alcohol use: Yes    Alcohol/week: 0.0 standard drinks    Comment: occ  . Drug use: Yes    Types: Marijuana    Comment: pot-about 1 joint BID  . Sexual activity: Yes    Birth control/protection: Condom  Other Topics Concern  . Not on file  Social History Narrative  . Not on file   Social Determinants of Health   Financial Resource Strain:   . Difficulty of Paying Living Expenses:   Food Insecurity:   . Worried About Charity fundraiser in the Last Year:   . Arboriculturist in the Last Year:   Transportation Needs:   . Film/video editor (Medical):   Marland Kitchen Lack of Transportation (Non-Medical):   Physical Activity:   . Days of Exercise per Week:   . Minutes of Exercise per Session:   Stress:   . Feeling of Stress :   Social Connections:   . Frequency of Communication with Friends and Family:   . Frequency of Social Gatherings with Friends and Family:   . Attends Religious Services:   . Active Member of Clubs or Organizations:   . Attends English as a second language teacher Meetings:   Marland Kitchen Marital Status:     Family History  Problem Relation Age of Onset  . Heart disease Maternal Grandmother   . Diabetes Maternal Grandmother   . Diabetes Father   . Diabetes Mother   . Cancer Maternal Uncle        prostate  . Cancer Paternal Aunt        lung  . Stroke Maternal Grandfather  Review of Systems  Constitutional: Negative for chills and fever.  Cardiovascular: Positive for leg swelling.  Skin: Positive for color change and wound.       Objective:   Vitals:   01/01/20 0900  BP: (!) 134/80  Pulse: 96  Temp: 98.3 F (36.8 C)  SpO2: 98%   BP Readings from Last 3 Encounters:  01/01/20 (!) 134/80  12/23/19 117/81  10/22/19 (!) 134/99   Wt Readings from Last 3 Encounters:  01/01/20 (!) 292 lb (132.5 kg)  12/22/19 (!) 309 lb (140.2 kg)  08/12/19 (!) 306 lb 9.6 oz (139.1 kg)   Body mass index is 50.12 kg/m.   Physical Exam Constitutional:      General: She is not in acute distress.    Appearance: Normal appearance. She is not ill-appearing.  Musculoskeletal:     Right lower leg: Edema (mild) present.     Left lower leg: Edema present.  Skin:    General: Skin is warm and dry.     Findings: Erythema and lesion (Left lower leg- small ulcer w/o active d/c or bleeding, surrouding erythema and peeling skin - warm but non tender.  some warm throughout lower leg.  ankle peeling.  ) present.  Neurological:     Mental Status: She is alert.             Assessment & Plan:    See Problem List for Assessment and Plan of chronic medical problems.    This visit occurred during the SARS-CoV-2 public health emergency.  Safety protocols were in place, including screening questions prior to the visit, additional usage of staff PPE, and extensive cleaning of exam room while observing appropriate contact time as indicated for disinfecting solutions.

## 2020-01-01 ENCOUNTER — Encounter: Payer: Self-pay | Admitting: Internal Medicine

## 2020-01-01 ENCOUNTER — Other Ambulatory Visit: Payer: Self-pay

## 2020-01-01 ENCOUNTER — Ambulatory Visit (INDEPENDENT_AMBULATORY_CARE_PROVIDER_SITE_OTHER): Payer: PRIVATE HEALTH INSURANCE | Admitting: Internal Medicine

## 2020-01-01 VITALS — BP 134/80 | HR 96 | Temp 98.3°F | Ht 64.0 in | Wt 292.0 lb

## 2020-01-01 DIAGNOSIS — I10 Essential (primary) hypertension: Secondary | ICD-10-CM | POA: Diagnosis not present

## 2020-01-01 DIAGNOSIS — E876 Hypokalemia: Secondary | ICD-10-CM | POA: Diagnosis not present

## 2020-01-01 DIAGNOSIS — L03119 Cellulitis of unspecified part of limb: Secondary | ICD-10-CM

## 2020-01-01 LAB — COMPLETE METABOLIC PANEL WITH GFR
AG Ratio: 0.7 (calc) — ABNORMAL LOW (ref 1.0–2.5)
ALT: 19 U/L (ref 6–29)
AST: 15 U/L (ref 10–30)
Albumin: 3.6 g/dL (ref 3.6–5.1)
Alkaline phosphatase (APISO): 68 U/L (ref 31–125)
BUN: 20 mg/dL (ref 7–25)
CO2: 31 mmol/L (ref 20–32)
Calcium: 9.6 mg/dL (ref 8.6–10.2)
Chloride: 100 mmol/L (ref 98–110)
Creat: 0.94 mg/dL (ref 0.50–1.10)
GFR, Est African American: 89 mL/min/{1.73_m2} (ref >=60)
GFR, Est Non African American: 77 mL/min/{1.73_m2} (ref >=60)
Globulin: 5.1 g/dL (calc) — ABNORMAL HIGH (ref 1.9–3.7)
Glucose, Bld: 94 mg/dL (ref 65–99)
Potassium: 4.5 mmol/L (ref 3.5–5.3)
Sodium: 137 mmol/L (ref 135–146)
Total Bilirubin: 0.5 mg/dL (ref 0.2–1.2)
Total Protein: 8.7 g/dL — ABNORMAL HIGH (ref 6.1–8.1)

## 2020-01-01 LAB — CBC WITH DIFFERENTIAL/PLATELET
Absolute Monocytes: 432 cells/uL (ref 200–950)
Basophils Absolute: 38 cells/uL (ref 0–200)
Basophils Relative: 0.8 %
Eosinophils Absolute: 110 cells/uL (ref 15–500)
Eosinophils Relative: 2.3 %
HCT: 39.1 % (ref 35.0–45.0)
Hemoglobin: 12.8 g/dL (ref 11.7–15.5)
Lymphs Abs: 2246 cells/uL (ref 850–3900)
MCH: 30.8 pg (ref 27.0–33.0)
MCHC: 32.7 g/dL (ref 32.0–36.0)
MCV: 94 fL (ref 80.0–100.0)
MPV: 8.9 fL (ref 7.5–12.5)
Monocytes Relative: 9 %
Neutro Abs: 1973 cells/uL (ref 1500–7800)
Neutrophils Relative %: 41.1 %
Platelets: 474 10*3/uL — ABNORMAL HIGH (ref 140–400)
RBC: 4.16 10*6/uL (ref 3.80–5.10)
RDW: 12.5 % (ref 11.0–15.0)
Total Lymphocyte: 46.8 %
WBC: 4.8 10*3/uL (ref 3.8–10.8)

## 2020-01-01 MED ORDER — BACITRACIN 500 UNIT/GM EX OINT
1.0000 | TOPICAL_OINTMENT | Freq: Two times a day (BID) | CUTANEOUS | 0 refills | Status: DC
Start: 2020-01-01 — End: 2021-01-03

## 2020-01-01 NOTE — Patient Instructions (Addendum)
  Blood work was ordered.     Medications reviewed and updated.  Changes include :   Apply bacitracin 1-2 times daily to the lower leg  Your prescription(s) have been submitted to your pharmacy. Please take as directed and contact our office if you believe you are having problem(s) with the medication(s).  A note was provided for work.    Keep a close eye on your leg and let me know if there are any concerning changes.

## 2020-01-01 NOTE — Assessment & Plan Note (Addendum)
Acute LLE cellulitis - was hospitalized for sepsis Completed abx course and infection was treated Area opened up after soaking it in epsom salts -- does not appear to be infected and she does feel well No abx at this time, but advised she needs to monitor this closely Bacitracin topically, keep covered Do not soak - ok to have soapy water run over leg Call with questions/ concerns Leg re-bandaged Will provide note for work so that she can keep her leg elevated or possibly work from home until this heals Continue compression on legs, work on weight loss We will check CBC

## 2020-01-01 NOTE — Assessment & Plan Note (Signed)
Acute Had hypokalemia in the hospital, which was repleted Check potassium-CMP

## 2020-01-01 NOTE — Assessment & Plan Note (Signed)
Chronic BP well controlled Current regimen effective and well tolerated Continue current medications at current doses  

## 2020-01-21 ENCOUNTER — Encounter: Payer: Self-pay | Admitting: Internal Medicine

## 2020-01-22 NOTE — Progress Notes (Signed)
Subjective:    Patient ID: Marissa Joseph, female    DOB: 11/18/1980, 40 y.o.   MRN: [id number]  HPI The patient is here for an acute visit.  She thinks she may be pregnant.     She was supposed to get her period on 8/1 but has not had it.    She has been 7 days late in the past.  She was just worried if she was pregnant or if something else was going on.  She has some increased stress.  She has fibroids.  She is not established with a gynecologist.  She took three tests at home - all negative.  She is taking her current medications daily.   Medications and allergies reviewed with patient and updated if appropriate.  Patient Active Problem List   Diagnosis Date Noted  . Hypokalemia 01/01/2020  . Bacteremia   . Recurrent cellulitis of lower extremity 12/17/2019  . Primary osteoarthritis of right knee 07/29/2019  . Acute lateral meniscus tear of right knee 07/29/2019  . Acute medial meniscus tear of right knee 07/29/2019  . Body mass index 50.0-59.9, adult ([place]) 07/29/2019  . Morbid obesity ([place]) 07/29/2019  . Muscular pain 11/13/2018  . Scapular dyskinesis 10/07/2018  . Acute right-sided thoracic back pain 09/25/2018  . Depression 05/10/2018  . Amenorrhea 10/10/2017  . Ventral hernia without obstruction or gangrene 07/13/2017  . Hearing difficulty 11/24/2016  . Prediabetes 09/06/2015  . Essential hypertension, benign 09/06/2015  . Smoking 12/22/2013  . Osteoarthritis of left knee 08/29/2013  . Pap smear of cervix with ASCUS, cannot exclude HGSIL 06/25/2013  . Fibroid uterus 05/22/2013  . Dysmenorrhea 05/22/2013  . Monilial intertrigo 01/06/2013  . ALLERGIC RHINITIS 04/10/2008  . HEEL PAIN, LEFT 11/22/2007  . Chronic venous insufficiency of lower extremity 08/06/2007  . Obstructive sleep apnea 03/25/2007  . Eczema 02/21/2007  . OBESITY NOS 01/21/2007  . Asthma 01/21/2007    Current Outpatient Medications on File Prior to Visit  Medication Sig Dispense  Refill  . albuterol (VENTOLIN HFA) 108 (90 Base) MCG/ACT inhaler Inhale 2 puffs into the lungs every 6 (six) hours as needed for wheezing. 1 Inhaler 8  . bacitracin 500 UNIT/GM ointment Apply 1 application topically 2 (two) times daily. 30 g 0  . budesonide-formoterol (SYMBICORT) 80-4.5 MCG/ACT inhaler Inhale 2 puffs into the lungs 2 (two) times daily. 1 Inhaler 3  . diclofenac Sodium (VOLTAREN) 1 % GEL Apply 4 g topically 4 (four) times daily as needed. 150 g 0  . furosemide (LASIX) 20 MG tablet Take 1 tablet (20 mg total) by mouth daily. 30 tablet 0  . gabapentin (NEURONTIN) 100 MG capsule Take 2 capsules (200 mg total) by mouth at bedtime. 60 capsule 3  . montelukast (SINGULAIR) 10 MG tablet Take 1 tablet (10 mg total) by mouth at bedtime. 90 tablet 1  . olmesartan-hydrochlorothiazide (BENICAR HCT) 20-12.5 MG tablet Take 1 tablet by mouth daily. 90 tablet 1  . traMADol (ULTRAM) 50 MG tablet Take 1 tablet (50 mg total) by mouth every 6 (six) hours as needed for moderate pain or severe pain. 20 tablet 0   No current facility-administered medications on file prior to visit.    Past Medical History:  Diagnosis Date  . Anxiety    no official dx  . Asthma   . Eczema   . Fibroids   . GONORRHEA 03/25/2008   Qualifier: Diagnosis of  By: Isla Pence    . Heart murmur   .  Heart murmur   . Hypertension   . MRSA (methicillin resistant staph aureus) culture positive 05/2012   cellulits/ left leg  . Obesity   . Sleep apnea     Past Surgical History:  Procedure Laterality Date  . NO PAST SURGERIES    . TEE WITHOUT CARDIOVERSION N/A 12/22/2019   Procedure: TRANSESOPHAGEAL ECHOCARDIOGRAM (TEE);  Surgeon: Donato Heinz, MD;  Location: [facility] ENDOSCOPY;  Service: Cardiovascular;  Laterality: N/A;    Social History   Socioeconomic History  . Marital status: Single    Spouse name: Not on file  . Number of children: Not on file  . Years of education: Not on file  . Highest  education level: Not on file  Occupational History  . Not on file  Tobacco Use  . Smoking status: Current Every Day Smoker    Packs/day: 0.20    Years: 5.00    Pack years: 1.00    Types: Cigarettes  . Smokeless tobacco: Never Used  Vaping Use  . Vaping Use: Every day  Substance and Sexual Activity  . Alcohol use: Yes    Alcohol/week: 0.0 standard drinks    Comment: occ  . Drug use: Yes    Types: Marijuana    Comment: pot-about 1 joint BID  . Sexual activity: Yes    Birth control/protection: Condom  Other Topics Concern  . Not on file  Social History Narrative  . Not on file   Social Determinants of Health   Financial Resource Strain:   . Difficulty of Paying Living Expenses: Not on file  Food Insecurity:   . Worried About Charity fundraiser in the Last Year: Not on file  . Ran Out of Food in the Last Year: Not on file  Transportation Needs:   . Lack of Transportation (Medical): Not on file  . Lack of Transportation (Non-Medical): Not on file  Physical Activity:   . Days of Exercise per Week: Not on file  . Minutes of Exercise per Session: Not on file  Stress:   . Feeling of Stress : Not on file  Social Connections:   . Frequency of Communication with Friends and Family: Not on file  . Frequency of Social Gatherings with Friends and Family: Not on file  . Attends Religious Services: Not on file  . Active Member of Clubs or Organizations: Not on file  . Attends Archivist Meetings: Not on file  . Marital Status: Not on file    Family History  Problem Relation Age of Onset  . Heart disease Maternal Grandmother   . Diabetes Maternal Grandmother   . Diabetes Father   . Diabetes Mother   . Cancer Maternal Uncle        prostate  . Cancer Paternal Aunt        lung  . Stroke Maternal Grandfather     Review of Systems  Constitutional: Negative for fever.  Respiratory: Negative for cough, shortness of breath and wheezing.   Cardiovascular: Negative  for chest pain, palpitations and leg swelling.  Genitourinary: Negative for dysuria, hematuria, vaginal bleeding and vaginal discharge.  Neurological: Negative for light-headedness and headaches.       Objective:   Vitals:   01/23/20 1515  BP: 134/72  Pulse: 81  Temp: 98.4 F (36.9 C)  SpO2: 98%   BP Readings from Last 3 Encounters:  01/23/20 134/72  01/01/20 (!) 134/80  12/23/19 117/81   Wt Readings from Last 3 Encounters:  01/23/20 Marland Kitchen)  307 lb (139.3 kg)  01/01/20 (!) 292 lb (132.5 kg)  12/22/19 (!) 309 lb (140.2 kg)   Body mass index is 52.7 kg/m.   Physical Exam    Constitutional: Appears well-developed and well-nourished. No distress.  Head: Normocephalic and atraumatic.  Neck: Neck supple. No tracheal deviation present. No thyromegaly present.  No cervical lymphadenopathy Cardiovascular: Normal rate, regular rhythm and normal heart sounds.  No murmur heard. No carotid bruit .  No edema Pulmonary/Chest: Effort normal and breath sounds normal. No respiratory distress. No has no wheezes. No rales.  Skin: Skin is warm and dry. Not diaphoretic.  Psychiatric: Normal mood and affect. Behavior is normal.       Assessment & Plan:    See Problem List for Assessment and Plan of chronic medical problems.    This visit occurred during the SARS-CoV-2 public health emergency.  Safety protocols were in place, including screening questions prior to the visit, additional usage of staff PPE, and extensive cleaning of exam room while observing appropriate contact time as indicated for disinfecting solutions.

## 2020-01-23 ENCOUNTER — Other Ambulatory Visit: Payer: Self-pay

## 2020-01-23 ENCOUNTER — Ambulatory Visit (INDEPENDENT_AMBULATORY_CARE_PROVIDER_SITE_OTHER): Payer: PRIVATE HEALTH INSURANCE | Admitting: Internal Medicine

## 2020-01-23 ENCOUNTER — Encounter: Payer: Self-pay | Admitting: Internal Medicine

## 2020-01-23 VITALS — BP 134/72 | HR 81 | Temp 98.4°F | Ht 64.0 in | Wt 307.0 lb

## 2020-01-23 DIAGNOSIS — I10 Essential (primary) hypertension: Secondary | ICD-10-CM | POA: Diagnosis not present

## 2020-01-23 DIAGNOSIS — N912 Amenorrhea, unspecified: Secondary | ICD-10-CM | POA: Diagnosis not present

## 2020-01-23 DIAGNOSIS — J4520 Mild intermittent asthma, uncomplicated: Secondary | ICD-10-CM | POA: Diagnosis not present

## 2020-01-23 LAB — POCT URINE PREGNANCY: Preg Test, Ur: NEGATIVE

## 2020-01-23 NOTE — Assessment & Plan Note (Signed)
Acute She is 20 days late Has been late in the past typically 7 days, but a few years ago she was 1 month late Urine pregnancy at home negative x3 Urine pregnancy here negative She would like a serum pregnancy test to confirm Will check TSH Advised to follow-up with GYN

## 2020-01-23 NOTE — Assessment & Plan Note (Signed)
Chronic BP well controlled Current regimen effective and well tolerated Continue current medications at current doses cmp  

## 2020-01-23 NOTE — Patient Instructions (Addendum)
[facility] [address] [place], [place] [address] [phone]   [facility] [place], [place], [place] Across the street from [place] down stairs for blood work.    Your urine pregnancy test is negative.    Continue your current medications.   Follow up in 6 months

## 2020-01-23 NOTE — Assessment & Plan Note (Signed)
Chronic Mild, intermittent Stable, controlled Continue current inhalers Only takes Singulair as needed

## 2020-01-24 LAB — COMPLETE METABOLIC PANEL WITH GFR
AG Ratio: 0.9 (calc) — ABNORMAL LOW (ref 1.0–2.5)
ALT: 12 U/L (ref 6–29)
AST: 14 U/L (ref 10–30)
Albumin: 3.6 g/dL (ref 3.6–5.1)
Alkaline phosphatase (APISO): 75 U/L (ref 31–125)
BUN/Creatinine Ratio: 31 (calc) — ABNORMAL HIGH (ref 6–22)
BUN: 27 mg/dL — ABNORMAL HIGH (ref 7–25)
CO2: 29 mmol/L (ref 20–32)
Calcium: 8.8 mg/dL (ref 8.6–10.2)
Chloride: 103 mmol/L (ref 98–110)
Creat: 0.87 mg/dL (ref 0.50–1.10)
GFR, Est African American: 98 mL/min/{1.73_m2} (ref >=60)
GFR, Est Non African American: 85 mL/min/{1.73_m2} (ref >=60)
Globulin: 3.9 g/dL (calc) — ABNORMAL HIGH (ref 1.9–3.7)
Glucose, Bld: 89 mg/dL (ref 65–99)
Potassium: 3.7 mmol/L (ref 3.5–5.3)
Sodium: 137 mmol/L (ref 135–146)
Total Bilirubin: 0.3 mg/dL (ref 0.2–1.2)
Total Protein: 7.5 g/dL (ref 6.1–8.1)

## 2020-01-24 LAB — TSH: TSH: 1.2 mIU/L

## 2020-01-24 LAB — HCG, SERUM, QUALITATIVE: Preg, Serum: NEGATIVE

## 2020-01-27 ENCOUNTER — Encounter: Payer: Self-pay | Admitting: Internal Medicine

## 2020-01-27 MED ORDER — FUROSEMIDE 20 MG PO TABS: 20.0000 mg | ORAL_TABLET | Freq: Every day | ORAL | 1 refills | Status: DC

## 2020-02-02 ENCOUNTER — Encounter: Payer: Self-pay | Admitting: Internal Medicine

## 2020-02-04 ENCOUNTER — Encounter: Payer: Self-pay | Admitting: Internal Medicine

## 2020-02-04 ENCOUNTER — Other Ambulatory Visit: Payer: Self-pay

## 2020-02-04 ENCOUNTER — Ambulatory Visit (INDEPENDENT_AMBULATORY_CARE_PROVIDER_SITE_OTHER): Payer: PRIVATE HEALTH INSURANCE | Admitting: Internal Medicine

## 2020-02-04 VITALS — BP 134/90 | HR 86 | Temp 98.9°F | Wt 304.0 lb

## 2020-02-04 DIAGNOSIS — K5900 Constipation, unspecified: Secondary | ICD-10-CM | POA: Insufficient documentation

## 2020-02-04 DIAGNOSIS — K581 Irritable bowel syndrome with constipation: Secondary | ICD-10-CM | POA: Insufficient documentation

## 2020-02-04 DIAGNOSIS — R1084 Generalized abdominal pain: Secondary | ICD-10-CM | POA: Diagnosis not present

## 2020-02-04 MED ORDER — POLYETHYLENE GLYCOL 3350 17 GM/SCOOP PO POWD
17.0000 g | Freq: Two times a day (BID) | ORAL | 1 refills | Status: DC | PRN
Start: 2020-02-04 — End: 2023-05-15

## 2020-02-04 MED ORDER — DULCOLAX 5 MG PO TBEC: 5.0000 mg | DELAYED_RELEASE_TABLET | Freq: Every day | ORAL | 0 refills | Status: DC | PRN

## 2020-02-04 NOTE — Progress Notes (Signed)
Subjective:    Patient ID: Marissa Joseph, female    DOB: 1980/07/11, 39 y.o.   MRN: [id number]  HPI The patient is here for an acute visit.  Sunday afternoon she started getting cramping on the left side in her LLQ.  It was discomfort.  She did have a BM that morning.  She ate normal that morning.     She has not had a BM since then.  It feels like she has to go.  She has pressure throughough her upper abd, back and even into her chest.   She tries to go to the bathroom, but can only go a little.  She is passing gas.  She typically has normal BM/s and has not had this problem.  Tried spinach, apple juice, wonton soup.  She does not feel like eating much.      Medications and allergies reviewed with patient and updated if appropriate.  Patient Active Problem List   Diagnosis Date Noted  . Hypokalemia 01/01/2020  . Bacteremia   . Recurrent cellulitis of lower extremity 12/17/2019  . Primary osteoarthritis of right knee 07/29/2019  . Acute lateral meniscus tear of right knee 07/29/2019  . Acute medial meniscus tear of right knee 07/29/2019  . Body mass index 50.0-59.9, adult ([place]) 07/29/2019  . Morbid obesity ([place]) 07/29/2019  . Muscular pain 11/13/2018  . Scapular dyskinesis 10/07/2018  . Acute right-sided thoracic back pain 09/25/2018  . Depression 05/10/2018  . Amenorrhea 10/10/2017  . Ventral hernia without obstruction or gangrene 07/13/2017  . Hearing difficulty 11/24/2016  . Prediabetes 09/06/2015  . Essential hypertension, benign 09/06/2015  . Smoking 12/22/2013  . Osteoarthritis of left knee 08/29/2013  . Pap smear of cervix with ASCUS, cannot exclude HGSIL 06/25/2013  . Fibroid uterus 05/22/2013  . Dysmenorrhea 05/22/2013  . Monilial intertrigo 01/06/2013  . ALLERGIC RHINITIS 04/10/2008  . HEEL PAIN, LEFT 11/22/2007  . Chronic venous insufficiency of lower extremity 08/06/2007  . Obstructive sleep apnea 03/25/2007  . Eczema 02/21/2007  . OBESITY NOS  01/21/2007  . Asthma 01/21/2007    Current Outpatient Medications on File Prior to Visit  Medication Sig Dispense Refill  . albuterol (VENTOLIN HFA) 108 (90 Base) MCG/ACT inhaler Inhale 2 puffs into the lungs every 6 (six) hours as needed for wheezing. 1 Inhaler 8  . bacitracin 500 UNIT/GM ointment Apply 1 application topically 2 (two) times daily. 30 g 0  . budesonide-formoterol (SYMBICORT) 80-4.5 MCG/ACT inhaler Inhale 2 puffs into the lungs 2 (two) times daily. 1 Inhaler 3  . diclofenac Sodium (VOLTAREN) 1 % GEL Apply 4 g topically 4 (four) times daily as needed. 150 g 0  . furosemide (LASIX) 20 MG tablet Take 1 tablet (20 mg total) by mouth daily. 90 tablet 1  . gabapentin (NEURONTIN) 100 MG capsule Take 2 capsules (200 mg total) by mouth at bedtime. 60 capsule 3  . montelukast (SINGULAIR) 10 MG tablet Take 1 tablet (10 mg total) by mouth at bedtime. 90 tablet 1  . olmesartan-hydrochlorothiazide (BENICAR HCT) 20-12.5 MG tablet Take 1 tablet by mouth daily. 90 tablet 1  . traMADol (ULTRAM) 50 MG tablet Take 1 tablet (50 mg total) by mouth every 6 (six) hours as needed for moderate pain or severe pain. 20 tablet 0   No current facility-administered medications on file prior to visit.    Past Medical History:  Diagnosis Date  . Anxiety    no official dx  . Asthma   . Eczema   .  Fibroids   . GONORRHEA 03/25/2008   Qualifier: Diagnosis of  By: Isla Pence    . Heart murmur   . Heart murmur   . Hypertension   . MRSA (methicillin resistant staph aureus) culture positive 05/2012   cellulits/ left leg  . Obesity   . Sleep apnea     Past Surgical History:  Procedure Laterality Date  . NO PAST SURGERIES    . TEE WITHOUT CARDIOVERSION N/A 12/22/2019   Procedure: TRANSESOPHAGEAL ECHOCARDIOGRAM (TEE);  Surgeon: Donato Heinz, MD;  Location: [facility] ENDOSCOPY;  Service: Cardiovascular;  Laterality: N/A;    Social History   Socioeconomic History  . Marital status: Single      Spouse name: Not on file  . Number of children: Not on file  . Years of education: Not on file  . Highest education level: Not on file  Occupational History  . Not on file  Tobacco Use  . Smoking status: Current Every Day Smoker    Packs/day: 0.20    Years: 5.00    Pack years: 1.00    Types: Cigarettes  . Smokeless tobacco: Never Used  Vaping Use  . Vaping Use: Every day  Substance and Sexual Activity  . Alcohol use: Yes    Alcohol/week: 0.0 standard drinks    Comment: occ  . Drug use: Yes    Types: Marijuana    Comment: pot-about 1 joint BID  . Sexual activity: Yes    Birth control/protection: Condom  Other Topics Concern  . Not on file  Social History Narrative  . Not on file   Social Determinants of Health   Financial Resource Strain:   . Difficulty of Paying Living Expenses: Not on file  Food Insecurity:   . Worried About Charity fundraiser in the Last Year: Not on file  . Ran Out of Food in the Last Year: Not on file  Transportation Needs:   . Lack of Transportation (Medical): Not on file  . Lack of Transportation (Non-Medical): Not on file  Physical Activity:   . Days of Exercise per Week: Not on file  . Minutes of Exercise per Session: Not on file  Stress:   . Feeling of Stress : Not on file  Social Connections:   . Frequency of Communication with Friends and Family: Not on file  . Frequency of Social Gatherings with Friends and Family: Not on file  . Attends Religious Services: Not on file  . Active Member of Clubs or Organizations: Not on file  . Attends Archivist Meetings: Not on file  . Marital Status: Not on file    Family History  Problem Relation Age of Onset  . Heart disease Maternal Grandmother   . Diabetes Maternal Grandmother   . Diabetes Father   . Diabetes Mother   . Cancer Maternal Uncle        prostate  . Cancer Paternal Aunt        lung  . Stroke Maternal Grandfather     Review of Systems  Constitutional:  Negative for fever.  Gastrointestinal: Positive for abdominal pain, constipation, nausea and vomiting. Negative for blood in stool and diarrhea.  Musculoskeletal: Positive for back pain.       Objective:   Vitals:   02/04/20 1347  BP: 134/90  Pulse: 86  Temp: 98.9 F (37.2 C)  SpO2: 97%   BP Readings from Last 3 Encounters:  02/04/20 134/90  01/23/20 134/72  01/01/20 (!) 134/80  Wt Readings from Last 3 Encounters:  02/04/20 (!) 304 lb (137.9 kg)  01/23/20 (!) 307 lb (139.3 kg)  01/01/20 (!) 292 lb (132.5 kg)   Body mass index is 52.18 kg/m.   Physical Exam Constitutional:      General: She is not in acute distress.    Appearance: Normal appearance. She is not ill-appearing.  Abdominal:     General: There is no distension.     Palpations: Abdomen is soft.     Tenderness: There is abdominal tenderness (diffusely tender with increased tenderness in LLQ). There is no guarding or rebound.     Comments: obese  Skin:    General: Skin is warm and dry.  Neurological:     Mental Status: She is alert.            Assessment & Plan:    See Problem List for Assessment and Plan of chronic medical problems.    This visit occurred during the SARS-CoV-2 public health emergency.  Safety protocols were in place, including screening questions prior to the visit, additional usage of staff PPE, and extensive cleaning of exam room while observing appropriate contact time as indicated for disinfecting solutions.

## 2020-02-04 NOTE — Assessment & Plan Note (Signed)
Acute Severe, associated with constipation, N/V and dec appetite Constipation is likely the cause of her symptoms miralax BID until good BM - if this is not effective bisacodyl Call if no improvement in next 24 hours Will consider imaging /labs if no improvement

## 2020-02-04 NOTE — Patient Instructions (Signed)
Use the miralax twice daily until your empty your bowels.   You can take bisacodyl daily as needed for constipation if the miralax does not help.      Please call if there is no improvement in your symptoms.

## 2020-02-12 ENCOUNTER — Ambulatory Visit: Payer: PRIVATE HEALTH INSURANCE | Admitting: Internal Medicine

## 2020-02-17 ENCOUNTER — Encounter: Payer: Self-pay | Admitting: Internal Medicine

## 2020-02-17 MED ORDER — OLMESARTAN MEDOXOMIL-HCTZ 20-12.5 MG PO TABS: 1.0000 | ORAL_TABLET | Freq: Every day | ORAL | 1 refills | Status: DC

## 2020-02-17 NOTE — Telephone Encounter (Signed)
Rx sent to [email or web address]

## 2020-02-17 NOTE — Telephone Encounter (Signed)
   Patient request refill on Benicar to [place], [place].

## 2020-02-17 NOTE — Addendum Note (Signed)
Addended by: Earnstine Regal on: 02/17/2020 04:22 PM   Modules accepted: Orders

## 2020-03-04 ENCOUNTER — Encounter: Payer: Self-pay | Admitting: Internal Medicine

## 2020-03-25 ENCOUNTER — Encounter: Payer: Self-pay | Admitting: Internal Medicine

## 2020-03-30 NOTE — Progress Notes (Deleted)
Subjective:    Patient ID: Marissa Joseph, female    DOB: [birth year], 39 y.o.   MRN: [id number]  HPI The patient is here for an acute visit.  Swelling and drainage in left and right leg -    Medications and allergies reviewed with patient and updated if appropriate.  Patient Active Problem List   Diagnosis Date Noted  . Generalized abdominal pain 02/04/2020  . Hypokalemia 01/01/2020  . Bacteremia   . Recurrent cellulitis of lower extremity 12/17/2019  . Primary osteoarthritis of right knee 07/29/2019  . Acute lateral meniscus tear of right knee 07/29/2019  . Acute medial meniscus tear of right knee 07/29/2019  . Body mass index 50.0-59.9, adult ([place]) 07/29/2019  . Morbid obesity ([place]) 07/29/2019  . Muscular pain 11/13/2018  . Scapular dyskinesis 10/07/2018  . Acute right-sided thoracic back pain 09/25/2018  . Depression 05/10/2018  . Amenorrhea 10/10/2017  . Ventral hernia without obstruction or gangrene 07/13/2017  . Hearing difficulty 11/24/2016  . Prediabetes 09/06/2015  . Essential hypertension, benign 09/06/2015  . Smoking 12/22/2013  . Osteoarthritis of left knee 08/29/2013  . Pap smear of cervix with ASCUS, cannot exclude HGSIL 06/25/2013  . Fibroid uterus 05/22/2013  . Dysmenorrhea 05/22/2013  . Monilial intertrigo 01/06/2013  . ALLERGIC RHINITIS 04/10/2008  . HEEL PAIN, LEFT 11/22/2007  . Chronic venous insufficiency of lower extremity 08/06/2007  . Obstructive sleep apnea 03/25/2007  . Eczema 02/21/2007  . OBESITY NOS 01/21/2007  . Asthma 01/21/2007    Current Outpatient Medications on File Prior to Visit  Medication Sig Dispense Refill  . albuterol (VENTOLIN HFA) 108 (90 Base) MCG/ACT inhaler Inhale 2 puffs into the lungs every 6 (six) hours as needed for wheezing. 1 Inhaler 8  . bacitracin 500 UNIT/GM ointment Apply 1 application topically 2 (two) times daily. 30 g 0  . bisacodyl (DULCOLAX) 5 MG EC tablet Take 1 tablet (5 mg total) by mouth  daily as needed for moderate constipation. 30 tablet 0  . budesonide-formoterol (SYMBICORT) 80-4.5 MCG/ACT inhaler Inhale 2 puffs into the lungs 2 (two) times daily. 1 Inhaler 3  . diclofenac Sodium (VOLTAREN) 1 % GEL Apply 4 g topically 4 (four) times daily as needed. 150 g 0  . furosemide (LASIX) 20 MG tablet Take 1 tablet (20 mg total) by mouth daily. 90 tablet 1  . gabapentin (NEURONTIN) 100 MG capsule Take 2 capsules (200 mg total) by mouth at bedtime. 60 capsule 3  . montelukast (SINGULAIR) 10 MG tablet Take 1 tablet (10 mg total) by mouth at bedtime. 90 tablet 1  . olmesartan-hydrochlorothiazide (BENICAR HCT) 20-12.5 MG tablet Take 1 tablet by mouth daily. 90 tablet 1  . polyethylene glycol powder (GLYCOLAX/MIRALAX) 17 GM/SCOOP powder Take 17 g by mouth 2 (two) times daily as needed for severe constipation. 116 g 1   No current facility-administered medications on file prior to visit.    Past Medical History:  Diagnosis Date  . Anxiety    no official dx  . Asthma   . Eczema   . Fibroids   . GONORRHEA 03/25/2008   Qualifier: Diagnosis of  By: Isla Pence    . Heart murmur   . Heart murmur   . Hypertension   . MRSA (methicillin resistant staph aureus) culture positive 05/2012   cellulits/ left leg  . Obesity   . Sleep apnea     Past Surgical History:  Procedure Laterality Date  . NO PAST SURGERIES    . TEE  WITHOUT CARDIOVERSION N/A 12/22/2019   Procedure: TRANSESOPHAGEAL ECHOCARDIOGRAM (TEE);  Surgeon: Donato Heinz, MD;  Location: [facility] ENDOSCOPY;  Service: Cardiovascular;  Laterality: N/A;    Social History   Socioeconomic History  . Marital status: Single    Spouse name: Not on file  . Number of children: Not on file  . Years of education: Not on file  . Highest education level: Not on file  Occupational History  . Not on file  Tobacco Use  . Smoking status: Current Every Day Smoker    Packs/day: 0.20    Years: 5.00    Pack years: 1.00    Types:  Cigarettes  . Smokeless tobacco: Never Used  Vaping Use  . Vaping Use: Every day  Substance and Sexual Activity  . Alcohol use: Yes    Alcohol/week: 0.0 standard drinks    Comment: occ  . Drug use: Yes    Types: Marijuana    Comment: pot-about 1 joint BID  . Sexual activity: Yes    Birth control/protection: Condom  Other Topics Concern  . Not on file  Social History Narrative  . Not on file   Social Determinants of Health   Financial Resource Strain:   . Difficulty of Paying Living Expenses: Not on file  Food Insecurity:   . Worried About Charity fundraiser in the Last Year: Not on file  . Ran Out of Food in the Last Year: Not on file  Transportation Needs:   . Lack of Transportation (Medical): Not on file  . Lack of Transportation (Non-Medical): Not on file  Physical Activity:   . Days of Exercise per Week: Not on file  . Minutes of Exercise per Session: Not on file  Stress:   . Feeling of Stress : Not on file  Social Connections:   . Frequency of Communication with Friends and Family: Not on file  . Frequency of Social Gatherings with Friends and Family: Not on file  . Attends Religious Services: Not on file  . Active Member of Clubs or Organizations: Not on file  . Attends Archivist Meetings: Not on file  . Marital Status: Not on file    Family History  Problem Relation Age of Onset  . Heart disease Maternal Grandmother   . Diabetes Maternal Grandmother   . Diabetes Father   . Diabetes Mother   . Cancer Maternal Uncle        prostate  . Cancer Paternal Aunt        lung  . Stroke Maternal Grandfather     Review of Systems     Objective:  There were no vitals filed for this visit. BP Readings from Last 3 Encounters:  02/04/20 134/90  01/23/20 134/72  01/01/20 (!) 134/80   Wt Readings from Last 3 Encounters:  02/04/20 (!) 304 lb (137.9 kg)  01/23/20 (!) 307 lb (139.3 kg)  01/01/20 (!) 292 lb (132.5 kg)   There is no height or weight on  file to calculate BMI.   Physical Exam         Assessment & Plan:    See Problem List for Assessment and Plan of chronic medical problems.    This visit occurred during the SARS-CoV-2 public health emergency.  Safety protocols were in place, including screening questions prior to the visit, additional usage of staff PPE, and extensive cleaning of exam room while observing appropriate contact time as indicated for disinfecting solutions.

## 2020-03-31 ENCOUNTER — Other Ambulatory Visit: Payer: Self-pay

## 2020-03-31 ENCOUNTER — Telehealth (INDEPENDENT_AMBULATORY_CARE_PROVIDER_SITE_OTHER): Payer: PRIVATE HEALTH INSURANCE | Admitting: Internal Medicine

## 2020-03-31 ENCOUNTER — Encounter: Payer: Self-pay | Admitting: Internal Medicine

## 2020-03-31 DIAGNOSIS — I890 Lymphedema, not elsewhere classified: Secondary | ICD-10-CM | POA: Insufficient documentation

## 2020-03-31 DIAGNOSIS — R600 Localized edema: Secondary | ICD-10-CM | POA: Insufficient documentation

## 2020-03-31 NOTE — Progress Notes (Signed)
Virtual Visit via Video Note  I connected with Marissa Joseph on 03/31/20 at  8:45 AM EDT by a video enabled telemedicine application and verified that I am speaking with the correct person using two identifiers.   I discussed the limitations of evaluation and management by telemedicine and the availability of in person appointments. The patient expressed understanding and agreed to proceed.  Present for the visit:  Myself, Dr Billey Gosling, Ronalee Red.  The patient is currently in her car and I am in the office.    No referring provider.    History of Present Illness: This is an acute visit for Swelling and drainage in left and right leg.   Both of her legs are swelling.  This has been a chronic issue but they have been more swollen recently.  She is not sure why.  She does try to watch her sodium intake.  She is not exercising regularly.  Her left leg has a small area of drainage - it is from a prior sore that was slowly healing.  The fluid is clear.  She denies any redness in either leg.  She has not had any fevers or chills.  Part of her first toenail on the left foot did come off-I had been damaged and she was wondering if it was going to fall off.  She does not think this is related.  She is taking her Lasix 20 mg daily.  She has needed higher doses in the past when her leg swelling increases.  Review of Systems  Constitutional: Negative for chills and fever.  Respiratory: Positive for shortness of breath (mild at times).   Cardiovascular: Positive for leg swelling. Negative for chest pain and palpitations.      Social History   Socioeconomic History  . Marital status: Single    Spouse name: Not on file  . Number of children: Not on file  . Years of education: Not on file  . Highest education level: Not on file  Occupational History  . Not on file  Tobacco Use  . Smoking status: Current Every Day Smoker    Packs/day: 0.20    Years: 5.00    Pack years: 1.00     Types: Cigarettes  . Smokeless tobacco: Never Used  Vaping Use  . Vaping Use: Every day  Substance and Sexual Activity  . Alcohol use: Yes    Alcohol/week: 0.0 standard drinks    Comment: occ  . Drug use: Yes    Types: Marijuana    Comment: pot-about 1 joint BID  . Sexual activity: Yes    Birth control/protection: Condom  Other Topics Concern  . Not on file  Social History Narrative  . Not on file   Social Determinants of Health   Financial Resource Strain:   . Difficulty of Paying Living Expenses: Not on file  Food Insecurity:   . Worried About Charity fundraiser in the Last Year: Not on file  . Ran Out of Food in the Last Year: Not on file  Transportation Needs:   . Lack of Transportation (Medical): Not on file  . Lack of Transportation (Non-Medical): Not on file  Physical Activity:   . Days of Exercise per Week: Not on file  . Minutes of Exercise per Session: Not on file  Stress:   . Feeling of Stress : Not on file  Social Connections:   . Frequency of Communication with Friends and Family: Not on file  . Frequency  of Social Gatherings with Friends and Family: Not on file  . Attends Religious Services: Not on file  . Active Member of Clubs or Organizations: Not on file  . Attends Archivist Meetings: Not on file  . Marital Status: Not on file     Observations/Objective: Appears well in NAD Breathing normally Unable to visualize legs during this visit  Assessment and Plan:  Increase lasix to 40 mg 5-7 days and then back to 20 mg a day.      See Problem List for Assessment and Plan of chronic medical problems.   Follow Up Instructions:    I discussed the assessment and treatment plan with the patient. The patient was provided an opportunity to ask questions and all were answered. The patient agreed with the plan and demonstrated an understanding of the instructions.   The patient was advised to call back or seek an in-person evaluation if the  symptoms worsen or if the condition fails to improve as anticipated.    Binnie Rail, MD

## 2020-03-31 NOTE — Assessment & Plan Note (Signed)
Acute on chronic She has chronic leg edema that is now worse This has been a recurrent problem for her Currently taking Lasix 20 mg daily Having some clear fluid leakage of the left leg, but no generalized redness, fevers or chills.  No tenderness.  I do not think she is a current infection/cellulitis Increase Lasix to 40 mg daily for 5-7 days until her swelling is controlled and then go back to 20 mg daily Elevate when sitting Stressed low-sodium diet Stressed regular exercise Stressed weight loss This visit was virtual today because she had to have a Covid test which is pending.  Advised her that if this comes out negative and that her's legs are not improving she needs to come into the office for an appointment, call sooner with any questions or concerns

## 2020-04-01 ENCOUNTER — Encounter: Payer: Self-pay | Admitting: Internal Medicine

## 2020-04-06 ENCOUNTER — Telehealth (HOSPITAL_COMMUNITY): Payer: Self-pay

## 2020-04-06 ENCOUNTER — Other Ambulatory Visit: Payer: Self-pay | Admitting: Nurse Practitioner

## 2020-04-06 ENCOUNTER — Telehealth: Payer: Self-pay | Admitting: Internal Medicine

## 2020-04-06 DIAGNOSIS — I10 Essential (primary) hypertension: Secondary | ICD-10-CM

## 2020-04-06 DIAGNOSIS — F17200 Nicotine dependence, unspecified, uncomplicated: Secondary | ICD-10-CM

## 2020-04-06 DIAGNOSIS — U071 COVID-19: Secondary | ICD-10-CM

## 2020-04-06 NOTE — Telephone Encounter (Signed)
Called patient to pre-screen for monoclonal antibody infusion after receiving recent positive test. Patient qualifies based off off co-morbid condition and/or member of an at risk group.   Patient Active Problem List   Diagnosis Date Noted  . Bilateral leg edema 03/31/2020  . Generalized abdominal pain 02/04/2020  . Hypokalemia 01/01/2020  . Bacteremia   . Recurrent cellulitis of lower extremity 12/17/2019  . Primary osteoarthritis of right knee 07/29/2019  . Acute lateral meniscus tear of right knee 07/29/2019  . Acute medial meniscus tear of right knee 07/29/2019  . Body mass index 50.0-59.9, adult ([place]) 07/29/2019  . Morbid obesity ([place]) 07/29/2019  . Muscular pain 11/13/2018  . Scapular dyskinesis 10/07/2018  . Acute right-sided thoracic back pain 09/25/2018  . Depression 05/10/2018  . Amenorrhea 10/10/2017  . Ventral hernia without obstruction or gangrene 07/13/2017  . Hearing difficulty 11/24/2016  . Prediabetes 09/06/2015  . Essential hypertension, benign 09/06/2015  . Smoking 12/22/2013  . Osteoarthritis of left knee 08/29/2013  . Pap smear of cervix with ASCUS, cannot exclude HGSIL 06/25/2013  . Fibroid uterus 05/22/2013  . Dysmenorrhea 05/22/2013  . Monilial intertrigo 01/06/2013  . ALLERGIC RHINITIS 04/10/2008  . HEEL PAIN, LEFT 11/22/2007  . Chronic venous insufficiency of lower extremity 08/06/2007  . Obstructive sleep apnea 03/25/2007  . Eczema 02/21/2007  . OBESITY NOS 01/21/2007  . Asthma 01/21/2007    Patient is interested in learning more about the infusion. RN forwarded information to APP's for additional screening/scheduling.   Armando Lauman Lorita Officer, RN

## 2020-04-06 NOTE — Progress Notes (Signed)
I connected by phone with Marissa Joseph on 04/06/2020 at 2:48 PM to discuss the potential use of a new treatment for mild to moderate COVID-19 viral infection in non-hospitalized patients.  This patient is a 39 y.o. female that meets the FDA criteria for Emergency Use Authorization of COVID monoclonal antibody casirivimab/imdevimab or bamlanivimab/eteseviamb.  Has a (+) direct SARS-CoV-2 viral test result  Has mild or moderate COVID-19   Is NOT hospitalized due to COVID-19  Is within 10 days of symptom onset  Has at least one of the high risk factor(s) for progression to severe COVID-19 and/or hospitalization as defined in EUA.  Specific high risk criteria : BMI > 25 and Cardiovascular disease or hypertension   I have spoken and communicated the following to the patient or parent/caregiver regarding COVID monoclonal antibody treatment:  1. FDA has authorized the emergency use for the treatment of mild to moderate COVID-19 in adults and pediatric patients with positive results of direct SARS-CoV-2 viral testing who are 46 years of age and older weighing at least 40 kg, and who are at high risk for progressing to severe COVID-19 and/or hospitalization.  2. The significant known and potential risks and benefits of COVID monoclonal antibody, and the extent to which such potential risks and benefits are unknown.  3. Information on available alternative treatments and the risks and benefits of those alternatives, including clinical trials.  4. Patients treated with COVID monoclonal antibody should continue to self-isolate and use infection control measures (e.g., wear mask, isolate, social distance, avoid sharing personal items, clean and disinfect "high touch" surfaces, and frequent handwashing) according to CDC guidelines.   5. The patient or parent/caregiver has the option to accept or refuse COVID monoclonal antibody treatment.  After reviewing this information with the patient, the  patient has agreed to receive one of the available covid 19 monoclonal antibodies and will be provided an appropriate fact sheet prior to infusion. Jobe Gibbon, NP 04/06/2020 2:48 PM

## 2020-04-06 NOTE — Telephone Encounter (Signed)
Patient was set up for the monoclonal infusions and would like to talk to someone about what it is and what she should expect

## 2020-04-07 ENCOUNTER — Ambulatory Visit (HOSPITAL_COMMUNITY)
Admission: RE | Admit: 2020-04-07 | Discharge: 2020-04-07 | Disposition: A | Discharge status: Home / Self Care | Payer: PRIVATE HEALTH INSURANCE | Source: Ambulatory Visit | Attending: Pulmonary Disease | Admitting: Pulmonary Disease

## 2020-04-07 DIAGNOSIS — I10 Essential (primary) hypertension: Secondary | ICD-10-CM

## 2020-04-07 DIAGNOSIS — U071 COVID-19: Secondary | ICD-10-CM | POA: Diagnosis present

## 2020-04-07 DIAGNOSIS — F17200 Nicotine dependence, unspecified, uncomplicated: Secondary | ICD-10-CM

## 2020-04-07 MED ORDER — SODIUM CHLORIDE 0.9 % IV SOLN: INTRAVENOUS | Status: DC | PRN

## 2020-04-07 MED ORDER — FAMOTIDINE IN NACL 20-0.9 MG/50ML-% IV SOLN: 20.0000 mg | Freq: Once | INTRAVENOUS | Status: DC | PRN

## 2020-04-07 MED ORDER — EPINEPHRINE 0.3 MG/0.3ML IJ SOAJ: 0.3000 mg | Freq: Once | INTRAMUSCULAR | Status: DC | PRN

## 2020-04-07 MED ORDER — METHYLPREDNISOLONE SODIUM SUCC 125 MG IJ SOLR: 125.0000 mg | Freq: Once | INTRAMUSCULAR | Status: DC | PRN

## 2020-04-07 MED ORDER — SOTROVIMAB 500 MG/8ML IV SOLN
500.0000 mg | Freq: Once | INTRAVENOUS | Status: AC
  Administered 2020-04-07: 500 mg via INTRAVENOUS

## 2020-04-07 MED ORDER — DIPHENHYDRAMINE HCL 50 MG/ML IJ SOLN: 50.0000 mg | Freq: Once | INTRAMUSCULAR | Status: DC | PRN

## 2020-04-07 MED ORDER — ALBUTEROL SULFATE HFA 108 (90 BASE) MCG/ACT IN AERS: 2.0000 | INHALATION_SPRAY | Freq: Once | RESPIRATORY_TRACT | Status: DC | PRN

## 2020-04-07 NOTE — Progress Notes (Addendum)
Diagnosis: COVID-19  Physician:Dr. Joya Gaskins  Procedure: [facility] Infusion [facility] Med: sotrovimab infusion - Provided patient with sotrovimab fact sheet for patients, parents and caregivers prior to infusion.  Complications: No immediate complications noted.  Discharge: Discharged home   [place] 04/07/2020

## 2020-04-07 NOTE — Discharge Instructions (Signed)

## 2020-04-07 NOTE — Telephone Encounter (Signed)
Message left for patient today.  She has appointment today at 10:30 for infusion.

## 2020-04-09 ENCOUNTER — Encounter (HOSPITAL_COMMUNITY): Payer: Self-pay

## 2020-04-09 ENCOUNTER — Emergency Department (HOSPITAL_COMMUNITY)
Admission: EM | Admit: 2020-04-09 | Discharge: 2020-04-09 | Disposition: A | Discharge status: Home / Self Care | Payer: PRIVATE HEALTH INSURANCE | Attending: Emergency Medicine | Admitting: Emergency Medicine

## 2020-04-09 ENCOUNTER — Other Ambulatory Visit: Payer: Self-pay

## 2020-04-09 ENCOUNTER — Emergency Department (HOSPITAL_COMMUNITY): Payer: PRIVATE HEALTH INSURANCE

## 2020-04-09 ENCOUNTER — Encounter: Payer: Self-pay | Admitting: Internal Medicine

## 2020-04-09 DIAGNOSIS — I10 Essential (primary) hypertension: Secondary | ICD-10-CM | POA: Diagnosis not present

## 2020-04-09 DIAGNOSIS — U071 COVID-19: Secondary | ICD-10-CM | POA: Diagnosis not present

## 2020-04-09 DIAGNOSIS — R059 Cough, unspecified: Secondary | ICD-10-CM | POA: Diagnosis present

## 2020-04-09 DIAGNOSIS — Z79899 Other long term (current) drug therapy: Secondary | ICD-10-CM | POA: Insufficient documentation

## 2020-04-09 DIAGNOSIS — F17210 Nicotine dependence, cigarettes, uncomplicated: Secondary | ICD-10-CM | POA: Insufficient documentation

## 2020-04-09 DIAGNOSIS — J45909 Unspecified asthma, uncomplicated: Secondary | ICD-10-CM | POA: Diagnosis not present

## 2020-04-09 MED ORDER — BENZONATATE 200 MG PO CAPS: 200.0000 mg | ORAL_CAPSULE | Freq: Three times a day (TID) | ORAL | 0 refills | Status: AC

## 2020-04-09 MED ORDER — ALBUTEROL SULFATE HFA 108 (90 BASE) MCG/ACT IN AERS: 1.0000 | INHALATION_SPRAY | Freq: Four times a day (QID) | RESPIRATORY_TRACT | 6 refills | Status: DC | PRN

## 2020-04-09 NOTE — ED Provider Notes (Signed)
[facility] DEPT Provider Note   CSN: [id number] Arrival date & time: 04/09/20  1027     History Chief Complaint  Patient presents with  . Cough  . Covid Positive    Marissa Joseph is a 39 y.o. female.  39 year old female with past medical history as listed below including obesity, hypertension, asthma presents with complaint of severe cough with left side chest discomfort. Patient states that she developed URI symptoms on Saturday (1 week ago) and had a positive Covid test. Patient then had an antibody infusion due to her risk factors. Patient has been managing her symptoms at home with supportive treatment and her albuterol inhaler however became concerned today when she developed discomfort in the left side of her chest which moves around, worse with taking very deep breaths, located more towards her back, concerned she may have pneumonia (has had pneumonia before). Denies any fevers, chills, shortness of breath or other complaints or concerns.        Past Medical History:  Diagnosis Date  . Anxiety    no official dx  . Asthma   . Eczema   . Fibroids   . GONORRHEA 03/25/2008   Qualifier: Diagnosis of  By: Isla Pence    . Heart murmur   . Heart murmur   . Hypertension   . MRSA (methicillin resistant staph aureus) culture positive 05/2012   cellulits/ left leg  . Obesity   . Sleep apnea     Patient Active Problem List   Diagnosis Date Noted  . Bilateral leg edema 03/31/2020  . Generalized abdominal pain 02/04/2020  . Hypokalemia 01/01/2020  . Bacteremia   . Recurrent cellulitis of lower extremity 12/17/2019  . Primary osteoarthritis of right knee 07/29/2019  . Acute lateral meniscus tear of right knee 07/29/2019  . Acute medial meniscus tear of right knee 07/29/2019  . Body mass index 50.0-59.9, adult ([facility]) 07/29/2019  . Morbid obesity ([facility]) 07/29/2019  . Muscular pain 11/13/2018  . Scapular dyskinesis 10/07/2018  .  Acute right-sided thoracic back pain 09/25/2018  . Depression 05/10/2018  . Amenorrhea 10/10/2017  . Ventral hernia without obstruction or gangrene 07/13/2017  . Hearing difficulty 11/24/2016  . Prediabetes 09/06/2015  . Essential hypertension, benign 09/06/2015  . Smoking 12/22/2013  . Osteoarthritis of left knee 08/29/2013  . Pap smear of cervix with ASCUS, cannot exclude HGSIL 06/25/2013  . Fibroid uterus 05/22/2013  . Dysmenorrhea 05/22/2013  . Monilial intertrigo 01/06/2013  . ALLERGIC RHINITIS 04/10/2008  . HEEL PAIN, LEFT 11/22/2007  . Chronic venous insufficiency of lower extremity 08/06/2007  . Obstructive sleep apnea 03/25/2007  . Eczema 02/21/2007  . OBESITY NOS 01/21/2007  . Asthma 01/21/2007    Past Surgical History:  Procedure Laterality Date  . NO PAST SURGERIES    . TEE WITHOUT CARDIOVERSION N/A 12/22/2019   Procedure: TRANSESOPHAGEAL ECHOCARDIOGRAM (TEE);  Surgeon: Donato Heinz, MD;  Location: [facility] ENDOSCOPY;  Service: Cardiovascular;  Laterality: N/A;     OB History    Gravida  0   Para      Term      Preterm      AB      Living        SAB      TAB      Ectopic      Multiple      Live Births              Family History  Problem Relation Age  of Onset  . Heart disease Maternal Grandmother   . Diabetes Maternal Grandmother   . Diabetes Father   . Diabetes Mother   . Cancer Maternal Uncle        prostate  . Cancer Paternal Aunt        lung  . Stroke Maternal Grandfather     Social History   Tobacco Use  . Smoking status: Current Every Day Smoker    Packs/day: 0.20    Years: 5.00    Pack years: 1.00    Types: Cigarettes  . Smokeless tobacco: Never Used  Vaping Use  . Vaping Use: Every day  Substance Use Topics  . Alcohol use: Yes    Alcohol/week: 0.0 standard drinks    Comment: occ  . Drug use: Yes    Types: Marijuana    Comment: pot-about 1 joint BID    Home Medications Prior to Admission medications     Medication Sig Start Date End Date Taking? Authorizing Provider  albuterol (VENTOLIN HFA) 108 (90 Base) MCG/ACT inhaler Inhale 1-2 puffs into the lungs every 6 (six) hours as needed for wheezing. 04/09/20   Suella Broad A, PA-C  bacitracin 500 UNIT/GM ointment Apply 1 application topically 2 (two) times daily. 01/01/20   Binnie Rail, MD  benzonatate (TESSALON) 200 MG capsule Take 1 capsule (200 mg total) by mouth every 8 (eight) hours for 10 days. 04/09/20 04/19/20  Tacy Learn, PA-C  bisacodyl (DULCOLAX) 5 MG EC tablet Take 1 tablet (5 mg total) by mouth daily as needed for moderate constipation. 02/04/20   Binnie Rail, MD  budesonide-formoterol (SYMBICORT) 80-4.5 MCG/ACT inhaler Inhale 2 puffs into the lungs 2 (two) times daily. 05/14/19   Binnie Rail, MD  diclofenac Sodium (VOLTAREN) 1 % GEL Apply 4 g topically 4 (four) times daily as needed. 07/17/19   Lamptey, Myrene Galas, MD  furosemide (LASIX) 20 MG tablet Take 1 tablet (20 mg total) by mouth daily. 01/27/20   Binnie Rail, MD  gabapentin (NEURONTIN) 100 MG capsule Take 2 capsules (200 mg total) by mouth at bedtime. 10/07/18   Lyndal Pulley, DO  montelukast (SINGULAIR) 10 MG tablet Take 1 tablet (10 mg total) by mouth at bedtime. 07/17/19   Chase Picket, MD  olmesartan-hydrochlorothiazide (BENICAR HCT) 20-12.5 MG tablet Take 1 tablet by mouth daily. 02/17/20   Binnie Rail, MD  polyethylene glycol powder (GLYCOLAX/MIRALAX) 17 GM/SCOOP powder Take 17 g by mouth 2 (two) times daily as needed for severe constipation. 02/04/20   Binnie Rail, MD    Allergies    Avocado and Shellfish allergy  Review of Systems   Review of Systems  Constitutional: Negative for chills and fever.  HENT: Positive for congestion and rhinorrhea.   Respiratory: Positive for cough. Negative for shortness of breath.   Cardiovascular: Positive for chest pain.  Gastrointestinal: Negative for nausea and vomiting.  Musculoskeletal: Positive for back pain.   Skin: Negative for rash and wound.  Neurological: Negative for weakness.  All other systems reviewed and are negative.   Physical Exam Updated Vital Signs BP (!) 157/101   Pulse 65   Temp 98.4 F (36.9 C) (Oral)   Resp 18   LMP 03/31/2020 (Approximate)   SpO2 96%   Physical Exam Vitals and nursing note reviewed.  Constitutional:      General: She is not in acute distress.    Appearance: She is well-developed. She is not diaphoretic.  HENT:  Head: Normocephalic and atraumatic.  Eyes:     Conjunctiva/sclera: Conjunctivae normal.  Cardiovascular:     Rate and Rhythm: Normal rate and regular rhythm.     Heart sounds: Normal heart sounds.  Pulmonary:     Effort: Pulmonary effort is normal.     Breath sounds: Normal breath sounds.  Musculoskeletal:        General: Tenderness present.     Cervical back: Neck supple.       Back:  Skin:    General: Skin is warm and dry.     Findings: No erythema or rash.  Neurological:     Mental Status: She is alert and oriented to person, place, and time.  Psychiatric:        Behavior: Behavior normal.     ED Results / Procedures / Treatments   Labs (all labs ordered are listed, but only abnormal results are displayed) Labs Reviewed - No data to display  EKG None  Radiology DG Chest [facility] 1 View  Result Date: 04/09/2020 CLINICAL DATA:  Cough, COVID-19 positive. EXAM: PORTABLE CHEST 1 VIEW COMPARISON:  March 29, 2015. FINDINGS: The heart size and mediastinal contours are within normal limits. Both lungs are clear. The visualized skeletal structures are unremarkable. IMPRESSION: No active disease. Electronically Signed   By: Marijo Conception M.D.   On: 04/09/2020 12:40    Procedures Procedures (including critical care time)  Medications Ordered in ED Medications - No data to display  ED Course  I have reviewed the triage vital signs and the nursing notes.  Pertinent labs & imaging results that were available during my  care of the patient were reviewed by me and considered in my medical decision making (see chart for details).  Clinical Course as of Apr 09 1336  Fri Apr 10, 2631  3621 39 year old female with recent diagnosis of Covid with worsening cough.  Patient states cough is constant, kept her up last night.  On exam, patient is well-appearing, not in distress.  Vitals reviewed, she is hypertensive, known diagnosis of hypertension has been taking OTC cold medications.  O2 sat is reassuring at 96%, she is not tachycardic.  Lung exam is unremarkable, does have mild posterior chest wall tenderness.  Chest x-ray is unremarkable.  Plan is to refill patient's inhaler, will give Tessalon for cough, advised to discontinue OTC cold medications, stick with Coricidin and monitor her blood pressure, follow-up with PCP.   [LM]    Clinical Course User Index [LM] Roque Lias   MDM Rules/Calculators/A&P                          Final Clinical Impression(s) / ED Diagnoses Final diagnoses:  COVID  Cough    Rx / DC Orders ED Discharge Orders         Ordered    albuterol (VENTOLIN HFA) 108 (90 Base) MCG/ACT inhaler  Every 6 hours PRN        04/09/20 1300    benzonatate (TESSALON) 200 MG capsule  Every 8 hours        04/09/20 1300           Tacy Learn, PA-C 04/09/20 1337    Wyvonnia Dusky, MD 04/09/20 1422

## 2020-04-09 NOTE — ED Triage Notes (Signed)
Pt presents with c/o cough. Pt reports she is covid positive and concerned she may have pneumonia. Pt denies any pain until she takes a deep breath and reports she has lung pain when that happens.

## 2020-04-09 NOTE — Telephone Encounter (Signed)
[facility] REPORT/CALL: ---Marissa Joseph states she has had covid on the 28th dx positive on 31st and received infusion Tuesday the 3rd -- Is having pulling on left side of chest under her breast that goes through to back hard to take a deep breath.  Advised go to ED now. (Patient went to ED)

## 2020-04-09 NOTE — Discharge Instructions (Signed)
Your chest x-ray today is reassuring. Your vitals are good except for your elevated blood pressure.   Home to rest. Tessalon as needed as prescribed for cough. Use Coricidin HBP as needed as directed for symptom relief. AVOID other OTC cough medications which could be causing your BP to be high today.  Follow up with your doctor as needed, return to the ER for worsening or concerning symptoms.

## 2020-06-01 ENCOUNTER — Encounter: Payer: Self-pay | Admitting: Internal Medicine

## 2020-06-08 ENCOUNTER — Other Ambulatory Visit: Payer: Self-pay

## 2020-06-08 NOTE — Progress Notes (Signed)
Subjective:    Patient ID: Marissa Joseph, female    DOB: 1980-11-08, 40 y.o.   MRN: [id number]  HPI The patient is here for an acute visit.  Left middle distal finger knuckle swelling and pain - it started three weeks ago.  She denies injury.  There wer no other joints involved.  It is still tight and hurts occaisonaly.  ibufprofen helped. There was mild associated redness. No warmth.  No fever at the time.  Just woke up with it.    Her GF has gout.    Boil inner thigh - x 2 weeks - not going away.  Has putting heat on it.  Tried vinegar and sitz bath.  A minimal of leakage, but then it stops.  She was concerned because she has had this in the past and its not getting better and she thinks she needs treatment.   Medications and allergies reviewed with patient and updated if appropriate.  Patient Active Problem List   Diagnosis Date Noted  . Bilateral leg edema 03/31/2020  . Generalized abdominal pain 02/04/2020  . Hypokalemia 01/01/2020  . Bacteremia   . Recurrent cellulitis of lower extremity 12/17/2019  . Primary osteoarthritis of right knee 07/29/2019  . Acute lateral meniscus tear of right knee 07/29/2019  . Acute medial meniscus tear of right knee 07/29/2019  . Body mass index 50.0-59.9, adult ([facility]) 07/29/2019  . Morbid obesity ([facility]) 07/29/2019  . Muscular pain 11/13/2018  . Scapular dyskinesis 10/07/2018  . Acute right-sided thoracic back pain 09/25/2018  . Depression 05/10/2018  . Amenorrhea 10/10/2017  . Ventral hernia without obstruction or gangrene 07/13/2017  . Hearing difficulty 11/24/2016  . Prediabetes 09/06/2015  . Essential hypertension, benign 09/06/2015  . Smoking 12/22/2013  . Osteoarthritis of left knee 08/29/2013  . Pap smear of cervix with ASCUS, cannot exclude HGSIL 06/25/2013  . Fibroid uterus 05/22/2013  . Dysmenorrhea 05/22/2013  . Monilial intertrigo 01/06/2013  . ALLERGIC RHINITIS 04/10/2008  . HEEL PAIN, LEFT 11/22/2007  . Chronic  venous insufficiency of lower extremity 08/06/2007  . Obstructive sleep apnea 03/25/2007  . Eczema 02/21/2007  . OBESITY NOS 01/21/2007  . Asthma 01/21/2007    Current Outpatient Medications on File Prior to Visit  Medication Sig Dispense Refill  . albuterol (VENTOLIN HFA) 108 (90 Base) MCG/ACT inhaler Inhale 1-2 puffs into the lungs every 6 (six) hours as needed for wheezing. 18 g 6  . bacitracin 500 UNIT/GM ointment Apply 1 application topically 2 (two) times daily. 30 g 0  . bisacodyl (DULCOLAX) 5 MG EC tablet Take 1 tablet (5 mg total) by mouth daily as needed for moderate constipation. 30 tablet 0  . budesonide-formoterol (SYMBICORT) 80-4.5 MCG/ACT inhaler Inhale 2 puffs into the lungs 2 (two) times daily. 1 Inhaler 3  . diclofenac Sodium (VOLTAREN) 1 % GEL Apply 4 g topically 4 (four) times daily as needed. 150 g 0  . furosemide (LASIX) 20 MG tablet Take 1 tablet (20 mg total) by mouth daily. 90 tablet 1  . gabapentin (NEURONTIN) 100 MG capsule Take 2 capsules (200 mg total) by mouth at bedtime. 60 capsule 3  . montelukast (SINGULAIR) 10 MG tablet Take 1 tablet (10 mg total) by mouth at bedtime. 90 tablet 1  . olmesartan-hydrochlorothiazide (BENICAR HCT) 20-12.5 MG tablet Take 1 tablet by mouth daily. 90 tablet 1  . polyethylene glycol powder (GLYCOLAX/MIRALAX) 17 GM/SCOOP powder Take 17 g by mouth 2 (two) times daily as needed for severe constipation. 116  g 1   No current facility-administered medications on file prior to visit.    Past Medical History:  Diagnosis Date  . Anxiety    no official dx  . Asthma   . Eczema   . Fibroids   . GONORRHEA 03/25/2008   Qualifier: Diagnosis of  By: Isla Pence    . Heart murmur   . Heart murmur   . Hypertension   . MRSA (methicillin resistant staph aureus) culture positive 05/2012   cellulits/ left leg  . Obesity   . Sleep apnea     Past Surgical History:  Procedure Laterality Date  . NO PAST SURGERIES    . TEE WITHOUT  CARDIOVERSION N/A 12/22/2019   Procedure: TRANSESOPHAGEAL ECHOCARDIOGRAM (TEE);  Surgeon: Donato Heinz, MD;  Location: [facility] ENDOSCOPY;  Service: Cardiovascular;  Laterality: N/A;    Social History   Socioeconomic History  . Marital status: Single    Spouse name: Not on file  . Number of children: Not on file  . Years of education: Not on file  . Highest education level: Not on file  Occupational History  . Not on file  Tobacco Use  . Smoking status: Current Every Day Smoker    Packs/day: 0.20    Years: 5.00    Pack years: 1.00    Types: Cigarettes  . Smokeless tobacco: Never Used  Vaping Use  . Vaping Use: Every day  Substance and Sexual Activity  . Alcohol use: Yes    Alcohol/week: 0.0 standard drinks    Comment: occ  . Drug use: Yes    Types: Marijuana    Comment: pot-about 1 joint BID  . Sexual activity: Yes    Birth control/protection: Condom  Other Topics Concern  . Not on file  Social History Narrative  . Not on file   Social Determinants of Health   Financial Resource Strain: Not on file  Food Insecurity: Not on file  Transportation Needs: Not on file  Physical Activity: Not on file  Stress: Not on file  Social Connections: Not on file    Family History  Problem Relation Age of Onset  . Heart disease Maternal Grandmother   . Diabetes Maternal Grandmother   . Diabetes Father   . Diabetes Mother   . Cancer Maternal Uncle        prostate  . Cancer Paternal Aunt        lung  . Stroke Maternal Grandfather     Review of Systems  Constitutional: Negative for chills and fever.  Musculoskeletal: Positive for joint swelling (Left middle DIP joint associated with mild redness and pain).  Skin:       Infected boil-right inner thigh       Objective:   Vitals:   06/09/20 1119  BP: 138/72  Pulse: 78  Temp: 98.3 F (36.8 C)  SpO2: 96%   BP Readings from Last 3 Encounters:  06/09/20 138/72  04/09/20 (!) 157/101  04/07/20 (!) 129/92   Wt  Readings from Last 3 Encounters:  06/09/20 (!) 321 lb 9.6 oz (145.9 kg)  02/04/20 (!) 304 lb (137.9 kg)  01/23/20 (!) 307 lb (139.3 kg)   Body mass index is 55.2 kg/m.   Physical Exam Constitutional:      General: She is not in acute distress.    Appearance: Normal appearance. She is not ill-appearing.  Musculoskeletal:     Comments: Left middle finger DIP joint with minimal swelling, minimal redness.  Slightly tender to palpation  and with movement  Skin:    General: Skin is warm and dry.     Comments: Cyst left inner thigh with mild tenderness, no active discharge  Neurological:     Mental Status: She is alert.            Assessment & Plan:    See Problem List for Assessment and Plan of chronic medical problems.    This visit occurred during the SARS-CoV-2 public health emergency.  Safety protocols were in place, including screening questions prior to the visit, additional usage of staff PPE, and extensive cleaning of exam room while observing appropriate contact time as indicated for disinfecting solutions.

## 2020-06-09 ENCOUNTER — Encounter: Payer: Self-pay | Admitting: Internal Medicine

## 2020-06-09 ENCOUNTER — Ambulatory Visit (INDEPENDENT_AMBULATORY_CARE_PROVIDER_SITE_OTHER): Payer: PRIVATE HEALTH INSURANCE | Admitting: Internal Medicine

## 2020-06-09 DIAGNOSIS — L729 Follicular cyst of the skin and subcutaneous tissue, unspecified: Secondary | ICD-10-CM | POA: Diagnosis not present

## 2020-06-09 DIAGNOSIS — M2540 Effusion, unspecified joint: Secondary | ICD-10-CM | POA: Insufficient documentation

## 2020-06-09 DIAGNOSIS — L089 Local infection of the skin and subcutaneous tissue, unspecified: Secondary | ICD-10-CM | POA: Insufficient documentation

## 2020-06-09 MED ORDER — INDOMETHACIN 50 MG PO CAPS
50.0000 mg | ORAL_CAPSULE | Freq: Three times a day (TID) | ORAL | 0 refills | Status: DC
Start: 2020-06-09 — End: 2020-07-02

## 2020-06-09 MED ORDER — DOXYCYCLINE HYCLATE 100 MG PO TABS
100.0000 mg | ORAL_TABLET | Freq: Two times a day (BID) | ORAL | 0 refills | Status: DC
Start: 2020-06-09 — End: 2020-07-27

## 2020-06-09 NOTE — Patient Instructions (Signed)
Take doxycycline for your infection.  Complete the full 10 days.   Take indomethacin for your joint - take it three times a day with food for up to 5 days - stop once the pain/swelling goes away.  Do not take any ibuprofen while taking this.

## 2020-06-09 NOTE — Assessment & Plan Note (Signed)
Acute Right inner thigh Started 2 weeks ago Minimal improvement with heat, sitz bath's Tender on exam without active drainage Start doxycycline 100 mg twice daily x10 days

## 2020-06-09 NOTE — Assessment & Plan Note (Signed)
Acute Left middle finger DIP joint x3 weeks No injury, just woke up with it ?  Gout/pseudogout No other joints involved There has been improvement with over-the-counter ibuprofen No evidence of infection Start indomethacin 50 mg 3 times daily AC for up to 5 days.  Advised her not to take any ibuprofen while taking this medication

## 2020-06-13 ENCOUNTER — Other Ambulatory Visit: Payer: Self-pay | Admitting: Internal Medicine

## 2020-07-02 ENCOUNTER — Other Ambulatory Visit: Payer: Self-pay | Admitting: Internal Medicine

## 2020-07-02 ENCOUNTER — Encounter: Payer: Self-pay | Admitting: Internal Medicine

## 2020-07-27 ENCOUNTER — Encounter: Payer: Self-pay | Admitting: Internal Medicine

## 2020-07-27 ENCOUNTER — Other Ambulatory Visit: Payer: Self-pay

## 2020-07-27 NOTE — Patient Instructions (Signed)
  Blood work was ordered.     Medications changes include :   Start lexparo 10 mg daily and wellbutrin 150 mg daily - take both together in the morning.  Gabapentin for your back at night.    Your prescription(s) have been submitted to your pharmacy. Please take as directed and contact our office if you believe you are having problem(s) with the medication(s).    Please followup in 4-6 weeks   Therapists -  [place] [facility] [place]  [address] [phone]  [facility] and [place] [phone] Office  [facility]            [place]

## 2020-07-27 NOTE — Progress Notes (Signed)
Subjective:    Patient ID: Marissa Joseph, female    DOB: 1980/11/22, 40 y.o.   MRN: [id number]  HPI The patient is here for follow up of their chronic medical problems, including htn, leg edema, prediabetes, obesity, b/l knee OA, OSA  She broke up with her boyfriend and was crying when I walked into the room.  She is back living with her mom.    Working 12-12.  Not getting enough sleep but sleep is good.  Body feels tired. She can not think for herself. She is jumpy.     Depression, anxiety - feels both.  Back pain - still tight and uncomfortable. Tried heat and it helped.  Feels like a pinched nerve radiates up back.  No leg symptoms  Medications and allergies reviewed with patient and updated if appropriate.  Patient Active Problem List   Diagnosis Date Noted  . Swollen joint 06/09/2020  . Infected cyst of skin 06/09/2020  . Bilateral leg edema 03/31/2020  . Generalized abdominal pain 02/04/2020  . Recurrent cellulitis of lower extremity 12/17/2019  . Primary osteoarthritis of right knee 07/29/2019  . Acute lateral meniscus tear of right knee 07/29/2019  . Acute medial meniscus tear of right knee 07/29/2019  . Morbid obesity with BMI of 50.0-59.9, adult ([place]) 07/29/2019  . Muscular pain 11/13/2018  . Scapular dyskinesis 10/07/2018  . Acute right-sided thoracic back pain 09/25/2018  . Depression 05/10/2018  . Amenorrhea 10/10/2017  . Ventral hernia without obstruction or gangrene 07/13/2017  . Hearing difficulty 11/24/2016  . Prediabetes 09/06/2015  . Essential hypertension, benign 09/06/2015  . Smoking 12/22/2013  . Osteoarthritis of left knee 08/29/2013  . Pap smear of cervix with ASCUS, cannot exclude HGSIL 06/25/2013  . Fibroid uterus 05/22/2013  . Dysmenorrhea 05/22/2013  . Monilial intertrigo 01/06/2013  . ALLERGIC RHINITIS 04/10/2008  . HEEL PAIN, LEFT 11/22/2007  . Chronic venous insufficiency of lower extremity 08/06/2007  . Obstructive sleep apnea  03/25/2007  . Eczema 02/21/2007  . Asthma 01/21/2007    Current Outpatient Medications on File Prior to Visit  Medication Sig Dispense Refill  . albuterol (VENTOLIN HFA) 108 (90 Base) MCG/ACT inhaler Inhale 1-2 puffs into the lungs every 6 (six) hours as needed for wheezing. 18 g 6  . bacitracin 500 UNIT/GM ointment Apply 1 application topically 2 (two) times daily. 30 g 0  . bisacodyl (DULCOLAX) 5 MG EC tablet Take 1 tablet (5 mg total) by mouth daily as needed for moderate constipation. 30 tablet 0  . budesonide-formoterol (SYMBICORT) 80-4.5 MCG/ACT inhaler Inhale 2 puffs into the lungs 2 (two) times daily. 1 Inhaler 3  . diclofenac Sodium (VOLTAREN) 1 % GEL Apply 4 g topically 4 (four) times daily as needed. 150 g 0  . furosemide (LASIX) 20 MG tablet Take 1 tablet (20 mg total) by mouth daily. 90 tablet 1  . gabapentin (NEURONTIN) 100 MG capsule Take 2 capsules (200 mg total) by mouth at bedtime. 60 capsule 3  . montelukast (SINGULAIR) 10 MG tablet Take 1 tablet (10 mg total) by mouth at bedtime. 90 tablet 1  . olmesartan-hydrochlorothiazide (BENICAR HCT) 20-12.5 MG tablet Take 1 tablet by mouth daily. 90 tablet 1  . polyethylene glycol powder (GLYCOLAX/MIRALAX) 17 GM/SCOOP powder Take 17 g by mouth 2 (two) times daily as needed for severe constipation. 116 g 1   No current facility-administered medications on file prior to visit.    Past Medical History:  Diagnosis Date  . Anxiety  no official dx  . Asthma   . Eczema   . Fibroids   . GONORRHEA 03/25/2008   Qualifier: Diagnosis of  By: Isla Pence    . Heart murmur   . Heart murmur   . Hypertension   . MRSA (methicillin resistant staph aureus) culture positive 05/2012   cellulits/ left leg  . Obesity   . Sleep apnea     Past Surgical History:  Procedure Laterality Date  . NO PAST SURGERIES    . TEE WITHOUT CARDIOVERSION N/A 12/22/2019   Procedure: TRANSESOPHAGEAL ECHOCARDIOGRAM (TEE);  Surgeon: Donato Heinz, MD;  Location: [facility] ENDOSCOPY;  Service: Cardiovascular;  Laterality: N/A;    Social History   Socioeconomic History  . Marital status: Single    Spouse name: Not on file  . Number of children: Not on file  . Years of education: Not on file  . Highest education level: Not on file  Occupational History  . Not on file  Tobacco Use  . Smoking status: Current Every Day Smoker    Packs/day: 0.20    Years: 5.00    Pack years: 1.00    Types: Cigarettes  . Smokeless tobacco: Never Used  Vaping Use  . Vaping Use: Every day  Substance and Sexual Activity  . Alcohol use: Yes    Alcohol/week: 0.0 standard drinks    Comment: occ  . Drug use: Yes    Types: Marijuana    Comment: pot-about 1 joint BID  . Sexual activity: Yes    Birth control/protection: Condom  Other Topics Concern  . Not on file  Social History Narrative  . Not on file   Social Determinants of Health   Financial Resource Strain: Not on file  Food Insecurity: Not on file  Transportation Needs: Not on file  Physical Activity: Not on file  Stress: Not on file  Social Connections: Not on file    Family History  Problem Relation Age of Onset  . Heart disease Maternal Grandmother   . Diabetes Maternal Grandmother   . Diabetes Father   . Diabetes Mother   . Cancer Maternal Uncle        prostate  . Cancer Paternal Aunt        lung  . Stroke Maternal Grandfather     Review of Systems  Constitutional: Negative for appetite change and fever.  Respiratory: Positive for shortness of breath (since covid). Negative for cough and wheezing.   Cardiovascular: Negative for chest pain and palpitations (when upset).  Neurological: Positive for headaches. Negative for light-headedness.  Psychiatric/Behavioral: Positive for dysphoric mood. Negative for sleep disturbance. The patient is nervous/anxious.        Objective:   Vitals:   07/28/20 0906  BP: 120/76  Pulse: 72  Temp: 98.6 F (37 C)  SpO2:  97%   BP Readings from Last 3 Encounters:  07/28/20 120/76  06/09/20 138/72  04/09/20 (!) 157/101   Wt Readings from Last 3 Encounters:  07/28/20 (!) 324 lb 6.4 oz (147.1 kg)  06/09/20 (!) 321 lb 9.6 oz (145.9 kg)  02/04/20 (!) 304 lb (137.9 kg)   Body mass index is 55.68 kg/m.   Physical Exam    Constitutional: Appears well-developed and well-nourished. No distress.  HENT:  Head: Normocephalic and atraumatic.  Neck: Neck supple. No tracheal deviation present. No thyromegaly present.  No cervical lymphadenopathy Cardiovascular: Normal rate, regular rhythm and normal heart sounds.   No murmur heard. No carotid bruit .  No edema Pulmonary/Chest: Effort normal and breath sounds normal. No respiratory distress. No has no wheezes. No rales.  Msk: pain in lower back with palpation Skin: Skin is warm and dry. Not diaphoretic.  Psychiatric: Normal mood and affect. Behavior is normal.      Assessment & Plan:    See Problem List for Assessment and Plan of chronic medical problems.    This visit occurred during the SARS-CoV-2 public health emergency.  Safety protocols were in place, including screening questions prior to the visit, additional usage of staff PPE, and extensive cleaning of exam room while observing appropriate contact time as indicated for disinfecting solutions.

## 2020-07-28 ENCOUNTER — Ambulatory Visit (INDEPENDENT_AMBULATORY_CARE_PROVIDER_SITE_OTHER): Payer: PRIVATE HEALTH INSURANCE | Admitting: Internal Medicine

## 2020-07-28 VITALS — BP 120/76 | HR 72 | Temp 98.6°F | Ht 64.0 in | Wt 324.4 lb

## 2020-07-28 DIAGNOSIS — Z6841 Body Mass Index (BMI) 40.0 and over, adult: Secondary | ICD-10-CM

## 2020-07-28 DIAGNOSIS — I10 Essential (primary) hypertension: Secondary | ICD-10-CM

## 2020-07-28 DIAGNOSIS — F4323 Adjustment disorder with mixed anxiety and depressed mood: Secondary | ICD-10-CM

## 2020-07-28 DIAGNOSIS — M545 Low back pain, unspecified: Secondary | ICD-10-CM

## 2020-07-28 DIAGNOSIS — R7303 Prediabetes: Secondary | ICD-10-CM

## 2020-07-28 DIAGNOSIS — R600 Localized edema: Secondary | ICD-10-CM

## 2020-07-28 LAB — COMPREHENSIVE METABOLIC PANEL
ALT: 10 U/L (ref 0–35)
AST: 13 U/L (ref 0–37)
Albumin: 3.6 g/dL (ref 3.5–5.2)
Alkaline Phosphatase: 58 U/L (ref 39–117)
BUN: 17 mg/dL (ref 6–23)
CO2: 29 mEq/L (ref 19–32)
Calcium: 9.1 mg/dL (ref 8.4–10.5)
Chloride: 105 mEq/L (ref 96–112)
Creatinine, Ser: 0.82 mg/dL (ref 0.40–1.20)
GFR: 90.2 mL/min (ref >60.00)
Glucose, Bld: 96 mg/dL (ref 70–99)
Potassium: 4.5 mEq/L (ref 3.5–5.1)
Sodium: 137 mEq/L (ref 135–145)
Total Bilirubin: 0.5 mg/dL (ref 0.2–1.2)
Total Protein: 7.2 g/dL (ref 6.0–8.3)

## 2020-07-28 LAB — HEMOGLOBIN A1C: Hgb A1c MFr Bld: 5.3 % (ref 4.6–6.5)

## 2020-07-28 LAB — VITAMIN D 25 HYDROXY (VIT D DEFICIENCY, FRACTURES): VITD: 20.95 ng/mL — ABNORMAL LOW (ref 30.00–100.00)

## 2020-07-28 LAB — VITAMIN B12: Vitamin B-12: 344 pg/mL (ref 211–911)

## 2020-07-28 MED ORDER — ESCITALOPRAM OXALATE 10 MG PO TABS
10.0000 mg | ORAL_TABLET | Freq: Every day | ORAL | 5 refills | Status: DC
Start: 2020-07-28 — End: 2020-09-02

## 2020-07-28 MED ORDER — FUROSEMIDE 20 MG PO TABS: 20.0000 mg | ORAL_TABLET | Freq: Every day | ORAL | 1 refills | Status: DC | PRN

## 2020-07-28 MED ORDER — GABAPENTIN 100 MG PO CAPS: 200.0000 mg | ORAL_CAPSULE | Freq: Every day | ORAL | 3 refills | Status: DC

## 2020-07-28 MED ORDER — BUPROPION HCL ER (XL) 150 MG PO TB24
150.0000 mg | ORAL_TABLET | Freq: Every day | ORAL | 5 refills | Status: DC
Start: 2020-07-28 — End: 2021-03-03

## 2020-07-28 NOTE — Assessment & Plan Note (Signed)
New Has h/o of depression and anxiety - not currently on medication We both agree she would benefit from medication Start wellbutrin 150 mg xl daily Start lexparo 10 mg daily F/u in 4-6 weeks, sooner if needed  Encouraged her to see a therapist - names/numbers given

## 2020-07-28 NOTE — Assessment & Plan Note (Signed)
Chronic ?Check a1c ?Low sugar / carb diet ?Stressed regular exercise ?Working on weight loss ? ?

## 2020-07-28 NOTE — Assessment & Plan Note (Signed)
Chronic BP well controlled Continue benicar-hct 20-12.5 daily cmp

## 2020-07-28 NOTE — Assessment & Plan Note (Signed)
Chronic Has lost weight and is working on it

## 2020-07-28 NOTE — Assessment & Plan Note (Signed)
Acute r lower back to thoracic pain Feels like a pinching sensation Try gabapentin 200 mg at bedtime Call if no improvement

## 2020-07-28 NOTE — Assessment & Plan Note (Signed)
Chronic Controlled Not taking lasix recently Change lasix 20 mg daily to daily prn only

## 2020-08-15 ENCOUNTER — Encounter: Payer: Self-pay | Admitting: Internal Medicine

## 2020-08-22 IMAGING — CT CT TIBIA FIBULA *L* W/ CM
2 of 3 series · 12 of 33 positions shown, 15 images · IV contrast (omnipaque)
Comparison: X-ray 12/17/2019

CLINICAL DATA: Left lower extremity pain, cellulitis

EXAM:
CT OF THE LEFT TIBIA-FIBULA AND ANKLE WITH CONTRAST
TECHNIQUE: Multidetector CT imaging of the left lower extremity was performed
following the standard protocol during bolus administration of
intravenous contrast. Field of view is from the left knee through
the left ankle.
CONTRAST:  100mL OMNIPAQUE IOHEXOL 300 MG/ML  SOLN

[Series 6: axial st · axial · 0.59mm/px · z∈[-1318,-822]mm · 9 of 391 slices shown, 12 images]
[im 31/391  soft-tissue]
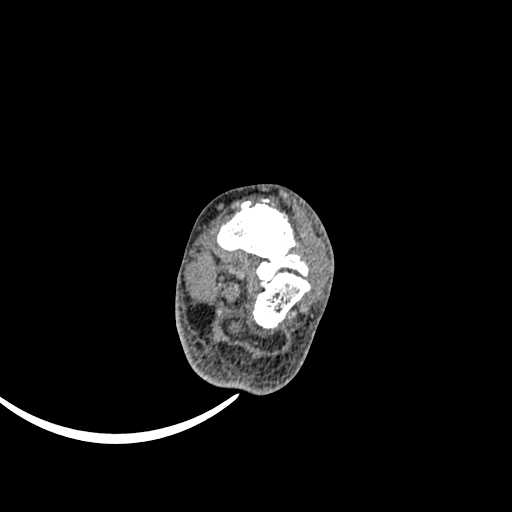
[im 31/391  bone]
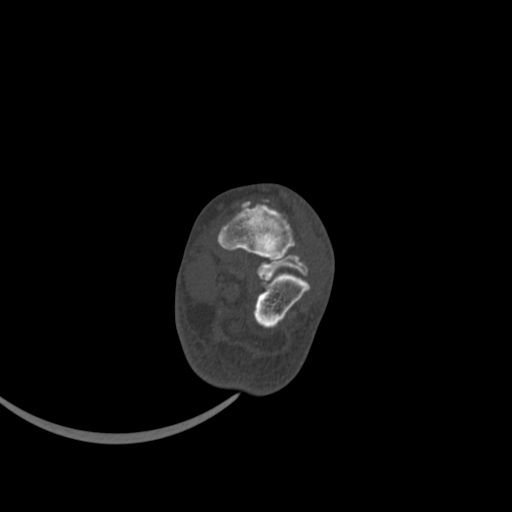
[im 91/391  bone]
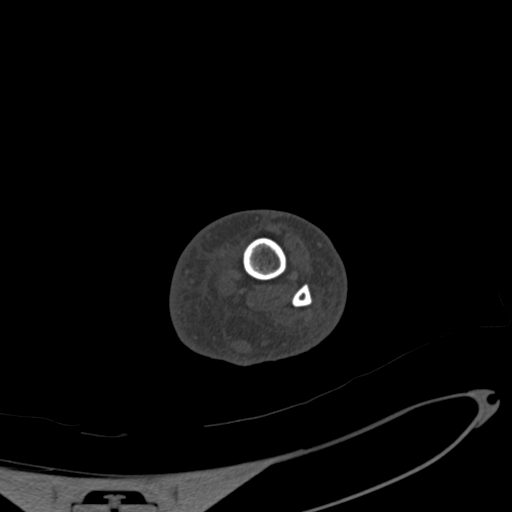
[im 121/391  bone]
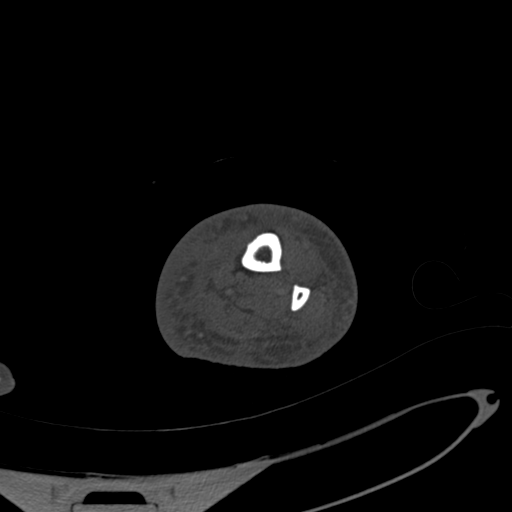
[im 151/391  bone]
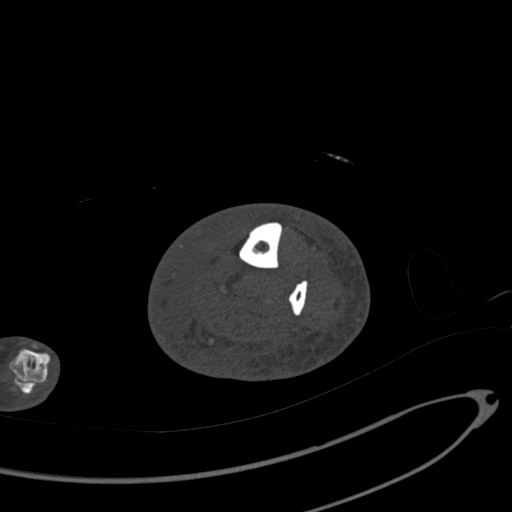
[im 211/391  soft-tissue]
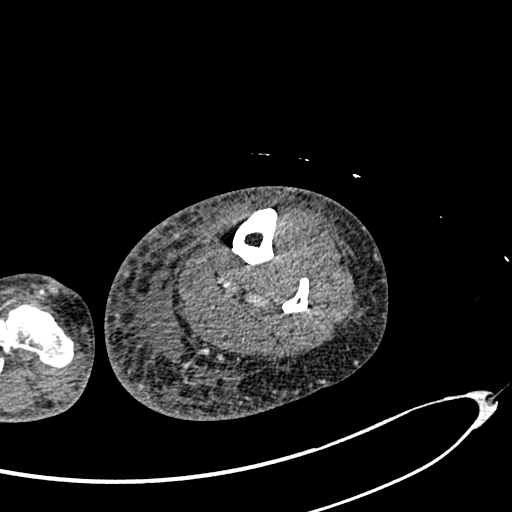
[im 211/391  bone]
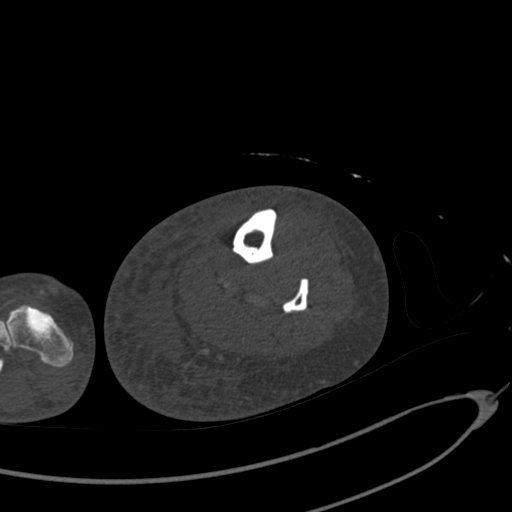
[im 241/391  bone]
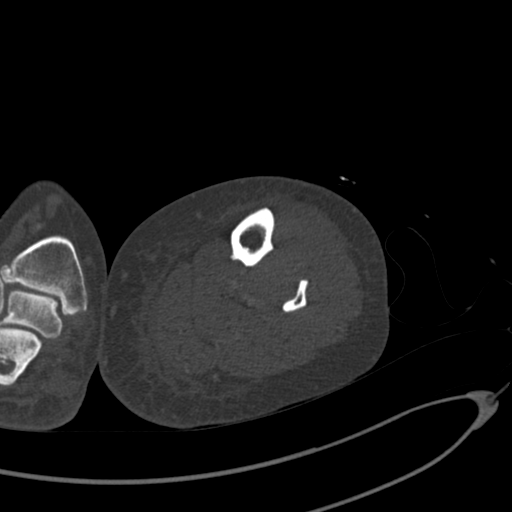
[im 271/391  bone]
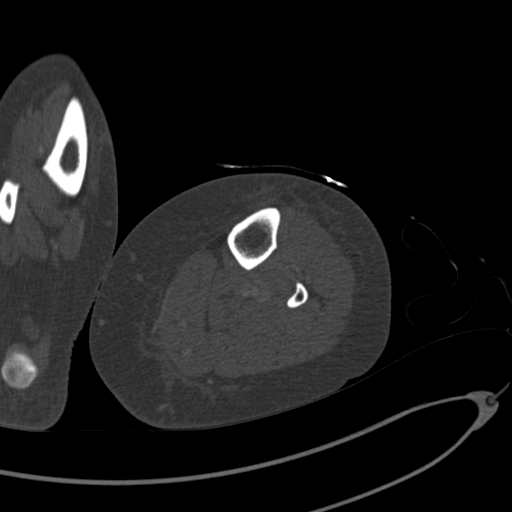
[im 331/391  bone]
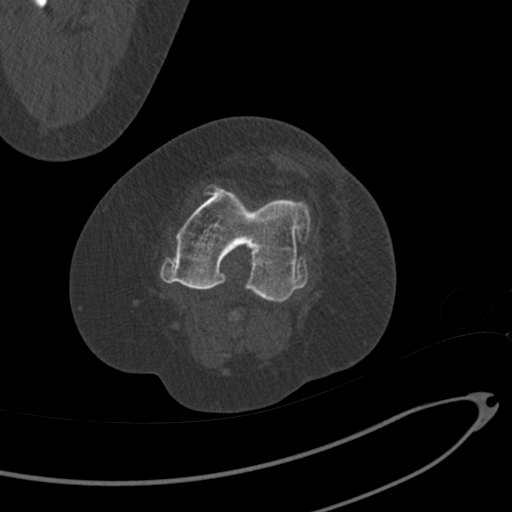
[im 361/391  soft-tissue]
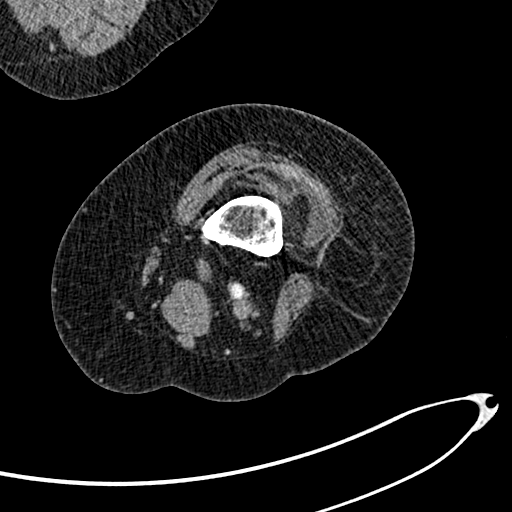
[im 361/391  bone]
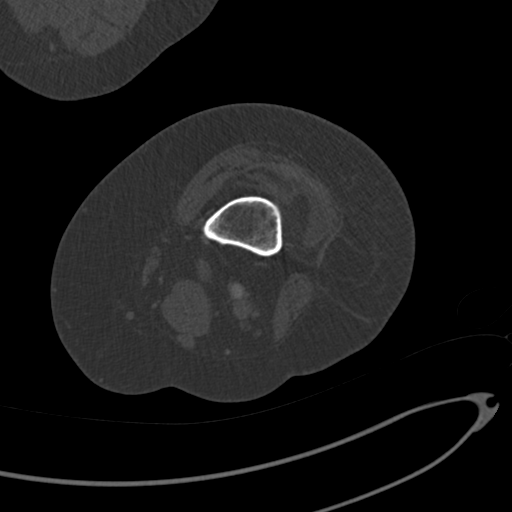

[Series 9: coronal st · coronal · 0.42mm/px · 3 of 131 slices shown]
[im 27/131  bone]
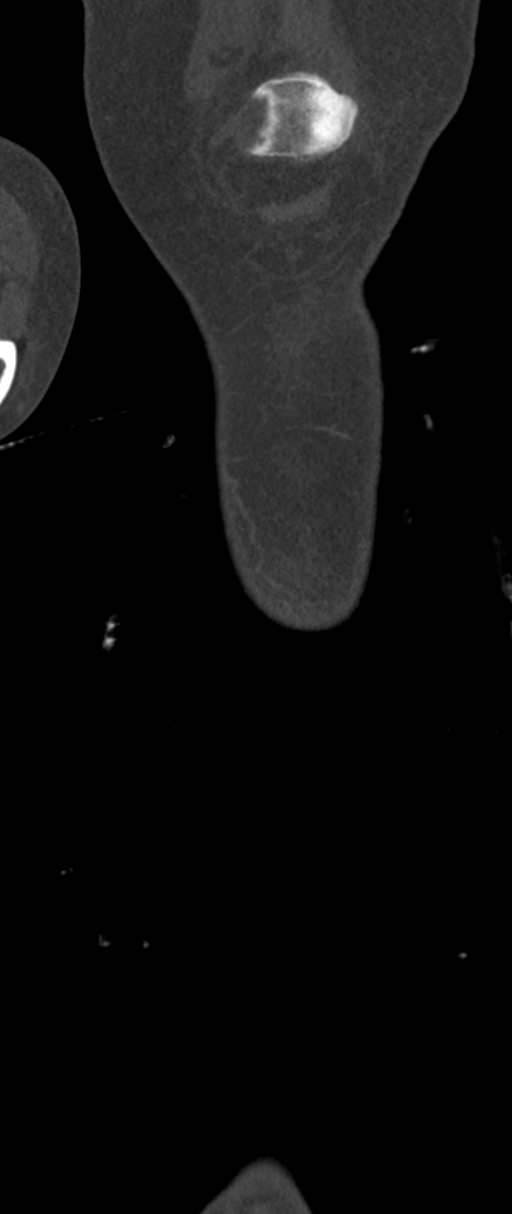
[im 53/131  bone]
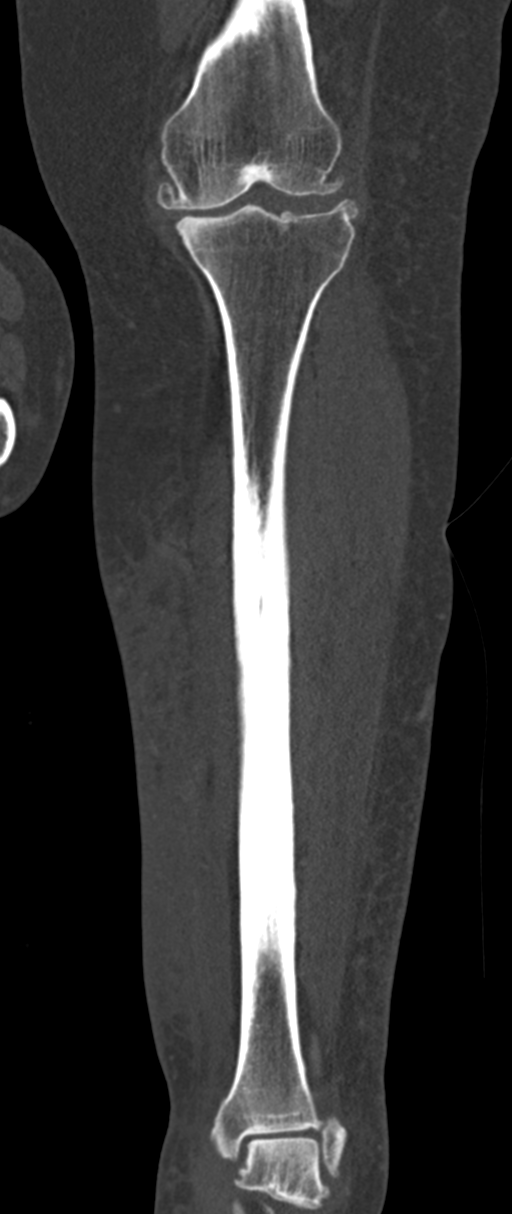
[im 79/131  bone]
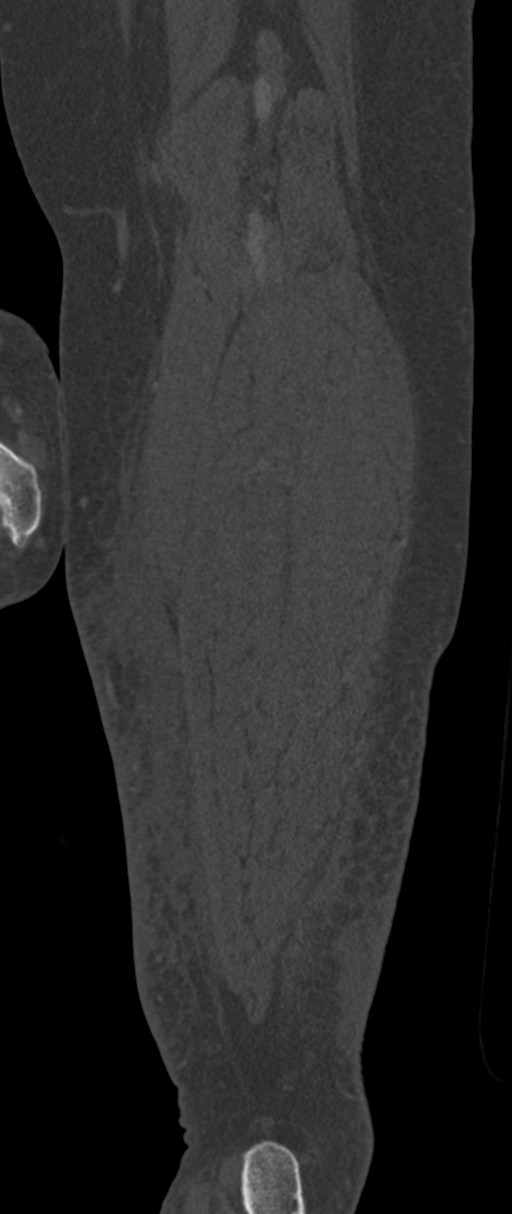

[12 of 33 positions shown; findings below may reference images not displayed]

FINDINGS: Bones/Joint/Cartilage

No acute fracture. No dislocation. No bony erosion or periostitis.
Moderate tricompartmental osteoarthritis of the left knee manifested
joint space narrowing, subchondral sclerosis, and prominent marginal
osteophyte formation. Degree of arthropathy is advanced for age.
Small left knee joint effusion. The left ankle and hindfoot are
anatomic [place] aligned without dislocation. Mild-to-moderate
arthropathy of the left tibiotalar joint with anterior osteophytic
spurring. Mild degenerative changes of the posterior subtalar joint
and within the midfoot. No appreciable tibiotalar or subtalar joint
effusion.

Ligaments

Suboptimally assessed by CT.

Muscles and Tendons

Preserved muscle bulk without atrophy or fatty infiltration. No
intramuscular fluid collection. Included tendinous structures appear
intact within the limitations of CT.

Soft tissues

Circumferential subcutaneous edema of the left lower leg which is
most pronounced in the region of the mid to distal tibial diaphysis,
particularly along the medial aspect. No organized or rim enhancing
fluid collection. No soft tissue gas. No discrete soft tissue
ulceration.
IMPRESSION: 1. Circumferential subcutaneous edema of the left lower leg which is
most pronounced in the region of the mid to distal tibial diaphysis,
particularly along the medial aspect. No organized or rim-enhancing
fluid collection. No soft tissue gas. Findings are nonspecific but
can be seen in the setting of cellulitis.
2. No acute osseous findings.  No evidence of osteomyelitis.
3. Degenerative changes including moderate tricompartmental
osteoarthritis of the left knee. The degree of arthropathy is
advanced for age.
4. Small left knee joint effusion, nonspecific.

## 2020-09-01 ENCOUNTER — Encounter: Payer: Self-pay | Admitting: Internal Medicine

## 2020-09-01 NOTE — Progress Notes (Signed)
Subjective:    Patient ID: Marissa Joseph, female    DOB: [birth year], 40 y.o.   MRN: [id number]  HPI The patient is here for follow up of her back pain and anxiety/depression.   Back pain - One month ago I prescribed gabapentin for her R lower-thoracic pain that felt like a pinched nerve. Her back pain is better.  The gabapentin has helped.    Anxiety, depression - started on wellbutrin 150 mg daily and lexapro 10 mg daily.  I advised she see a therapist. The medication has helped.  She still feels emotional at times.  She still has anxiety       Medications and allergies reviewed with patient and updated if appropriate.  Patient Active Problem List   Diagnosis Date Noted  . Swollen joint 06/09/2020  . Infected cyst of skin 06/09/2020  . Bilateral leg edema 03/31/2020  . Generalized abdominal pain 02/04/2020  . Recurrent cellulitis of lower extremity 12/17/2019  . Primary osteoarthritis of right knee 07/29/2019  . Acute lateral meniscus tear of right knee 07/29/2019  . Acute medial meniscus tear of right knee 07/29/2019  . Morbid obesity with BMI of 50.0-59.9, adult ([place]) 07/29/2019  . Muscular pain 11/13/2018  . Scapular dyskinesis 10/07/2018  . Acute back pain 09/25/2018  . Adjustment disorder with mixed anxiety and depressed mood 05/10/2018  . Amenorrhea 10/10/2017  . Ventral hernia without obstruction or gangrene 07/13/2017  . Hearing difficulty 11/24/2016  . Prediabetes 09/06/2015  . Essential hypertension, benign 09/06/2015  . Smoking 12/22/2013  . Osteoarthritis of left knee 08/29/2013  . Pap smear of cervix with ASCUS, cannot exclude HGSIL 06/25/2013  . Fibroid uterus 05/22/2013  . Dysmenorrhea 05/22/2013  . Monilial intertrigo 01/06/2013  . ALLERGIC RHINITIS 04/10/2008  . HEEL PAIN, LEFT 11/22/2007  . Chronic venous insufficiency of lower extremity 08/06/2007  . Obstructive sleep apnea 03/25/2007  . Eczema 02/21/2007  . Asthma 01/21/2007     Current Outpatient Medications on File Prior to Visit  Medication Sig Dispense Refill  . albuterol (VENTOLIN HFA) 108 (90 Base) MCG/ACT inhaler Inhale 1-2 puffs into the lungs every 6 (six) hours as needed for wheezing. 18 g 6  . bacitracin 500 UNIT/GM ointment Apply 1 application topically 2 (two) times daily. 30 g 0  . bisacodyl (DULCOLAX) 5 MG EC tablet Take 1 tablet (5 mg total) by mouth daily as needed for moderate constipation. 30 tablet 0  . budesonide-formoterol (SYMBICORT) 80-4.5 MCG/ACT inhaler Inhale 2 puffs into the lungs 2 (two) times daily. 1 Inhaler 3  . buPROPion (WELLBUTRIN XL) 150 MG 24 hr tablet Take 1 tablet (150 mg total) by mouth daily. 30 tablet 5  . diclofenac Sodium (VOLTAREN) 1 % GEL Apply 4 g topically 4 (four) times daily as needed. 150 g 0  . escitalopram (LEXAPRO) 10 MG tablet Take 1 tablet (10 mg total) by mouth daily. 30 tablet 5  . furosemide (LASIX) 20 MG tablet Take 1 tablet (20 mg total) by mouth daily as needed. 90 tablet 1  . gabapentin (NEURONTIN) 100 MG capsule Take 2 capsules (200 mg total) by mouth at bedtime. 60 capsule 3  . montelukast (SINGULAIR) 10 MG tablet Take 1 tablet (10 mg total) by mouth at bedtime. 90 tablet 1  . olmesartan-hydrochlorothiazide (BENICAR HCT) 20-12.5 MG tablet Take 1 tablet by mouth daily. 90 tablet 1  . polyethylene glycol powder (GLYCOLAX/MIRALAX) 17 GM/SCOOP powder Take 17 g by mouth 2 (two) times daily as needed for  severe constipation. 116 g 1   No current facility-administered medications on file prior to visit.    Past Medical History:  Diagnosis Date  . Anxiety    no official dx  . Asthma   . Eczema   . Fibroids   . GONORRHEA 03/25/2008   Qualifier: Diagnosis of  By: Isla Pence    . Heart murmur   . Heart murmur   . Hypertension   . MRSA (methicillin resistant staph aureus) culture positive 05/2012   cellulits/ left leg  . Obesity   . Sleep apnea     Past Surgical History:  Procedure  Laterality Date  . NO PAST SURGERIES    . TEE WITHOUT CARDIOVERSION N/A 12/22/2019   Procedure: TRANSESOPHAGEAL ECHOCARDIOGRAM (TEE);  Surgeon: Donato Heinz, MD;  Location: [facility] ENDOSCOPY;  Service: Cardiovascular;  Laterality: N/A;    Social History   Socioeconomic History  . Marital status: Single    Spouse name: Not on file  . Number of children: Not on file  . Years of education: Not on file  . Highest education level: Not on file  Occupational History  . Not on file  Tobacco Use  . Smoking status: Current Every Day Smoker    Packs/day: 0.20    Years: 5.00    Pack years: 1.00    Types: Cigarettes  . Smokeless tobacco: Never Used  Vaping Use  . Vaping Use: Every day  Substance and Sexual Activity  . Alcohol use: Yes    Alcohol/week: 0.0 standard drinks    Comment: occ  . Drug use: Yes    Types: Marijuana    Comment: pot-about 1 joint BID  . Sexual activity: Yes    Birth control/protection: Condom  Other Topics Concern  . Not on file  Social History Narrative  . Not on file   Social Determinants of Health   Financial Resource Strain: Not on file  Food Insecurity: Not on file  Transportation Needs: Not on file  Physical Activity: Not on file  Stress: Not on file  Social Connections: Not on file    Family History  Problem Relation Age of Onset  . Heart disease Maternal Grandmother   . Diabetes Maternal Grandmother   . Diabetes Father   . Diabetes Mother   . Cancer Maternal Uncle        prostate  . Cancer Paternal Aunt        lung  . Stroke Maternal Grandfather     Review of Systems  Constitutional: Positive for appetite change (decreased - has to remind herself to eat).  Cardiovascular: Negative for chest pain and palpitations.  Gastrointestinal: Positive for nausea (some in morninglexapo 20).  Neurological: Negative for headaches.  Psychiatric/Behavioral: Positive for dysphoric mood. Negative for sleep disturbance. The patient is  nervous/anxious.        Objective:   Vitals:   09/02/20 0907  BP: 114/78  Pulse: 86  Temp: 98.2 F (36.8 C)  SpO2: 99%   BP Readings from Last 3 Encounters:  09/02/20 114/78  07/28/20 120/76  06/09/20 138/72   Wt Readings from Last 3 Encounters:  09/02/20 (!) 314 lb (142.4 kg)  07/28/20 (!) 324 lb 6.4 oz (147.1 kg)  06/09/20 (!) 321 lb 9.6 oz (145.9 kg)   Body mass index is 53.9 kg/m.   Physical Exam    Constitutional: Appears well-developed and well-nourished. No distress.  Skin: Skin is warm and dry. Not diaphoretic.  Psychiatric: Normal mood and affect. Behavior  is normal.      Assessment & Plan:    See Problem List for Assessment and Plan of chronic medical problems.    This visit occurred during the SARS-CoV-2 public health emergency.  Safety protocols were in place, including screening questions prior to the visit, additional usage of staff PPE, and extensive cleaning of exam room while observing appropriate contact time as indicated for disinfecting solutions.

## 2020-09-02 ENCOUNTER — Other Ambulatory Visit: Payer: Self-pay

## 2020-09-02 ENCOUNTER — Ambulatory Visit (INDEPENDENT_AMBULATORY_CARE_PROVIDER_SITE_OTHER): Payer: PRIVATE HEALTH INSURANCE | Admitting: Internal Medicine

## 2020-09-02 DIAGNOSIS — F4323 Adjustment disorder with mixed anxiety and depressed mood: Secondary | ICD-10-CM | POA: Diagnosis not present

## 2020-09-02 DIAGNOSIS — M545 Low back pain, unspecified: Secondary | ICD-10-CM | POA: Diagnosis not present

## 2020-09-02 MED ORDER — ESCITALOPRAM OXALATE 20 MG PO TABS: 20.0000 mg | ORAL_TABLET | Freq: Every day | ORAL | 1 refills | Status: DC

## 2020-09-02 NOTE — Patient Instructions (Signed)
   Medications changes include :   Increase lexapro to 20 mg daily  Your prescription(s) have been submitted to your pharmacy. Please take as directed and contact our office if you believe you are having problem(s) with the medication(s).     Please followup in 6 months

## 2020-09-02 NOTE — Assessment & Plan Note (Signed)
Improved since her last visit Continue gabapentin 200 mg HS prn Keep active, working on weight loss

## 2020-09-02 NOTE — Assessment & Plan Note (Signed)
Improved with the medications Still anxiety, emotional at times Continue wellbutrin 150 mg XL daily Increase lexparo to 20 mg daily Discussed that we can adjust meds further if needed F/u in 6 months sooner if neede

## 2020-09-13 ENCOUNTER — Other Ambulatory Visit: Payer: Self-pay

## 2020-09-13 ENCOUNTER — Ambulatory Visit (INDEPENDENT_AMBULATORY_CARE_PROVIDER_SITE_OTHER): Payer: PRIVATE HEALTH INSURANCE | Admitting: Internal Medicine

## 2020-09-13 ENCOUNTER — Encounter: Payer: Self-pay | Admitting: Internal Medicine

## 2020-09-13 DIAGNOSIS — R112 Nausea with vomiting, unspecified: Secondary | ICD-10-CM

## 2020-09-13 MED ORDER — ONDANSETRON 8 MG PO TBDP: 8.0000 mg | ORAL_TABLET | Freq: Three times a day (TID) | ORAL | 0 refills | Status: DC | PRN

## 2020-09-13 NOTE — Assessment & Plan Note (Addendum)
Acute Started yesterday Vomiting here today - clear - yellow liquid Given 8 mg Zofran ODT and after a while her nausea improved and vomiting stopped ?  Food poisoning, viral gastroenteritis,?  Related to supplements Hold supplements Hold blood pressure medication until she is eating and drinking normally Start Zofran 8 mg ODT every 8 hours as needed Advised her to try to drink as much fluids as possible, small bites of food when she is able-bland diet and advance as tolerated Note given for work today and tomorrow-she will update me if she is not feeling better and cannot return on Wednesday  Advised that if she is not able to keep food down he continues to vomit she may need to go to the emergency room for fluids

## 2020-09-13 NOTE — Patient Instructions (Addendum)
Take the anti-nausea medication every 8 hrs as needed.   Drinking water and eat small amounts, bland foods.   Note given for work.      Hold your BP medication until you are eating/ drinking normally.      Nausea and Vomiting, Adult Nausea is the feeling that you have an upset stomach or that you are about to vomit. Vomiting is when stomach contents are thrown up and out of the mouth as a result of nausea. Vomiting can make you feel weak and cause you to become dehydrated. Dehydration can make you feel tired and thirsty, cause you to have a dry mouth, and decrease how often you urinate. Older adults and people with other diseases or a weak disease-fighting system (immune system) are at higher risk for dehydration. It is important to treat your nausea and vomiting as told by your health care provider. Follow these instructions at home: Watch your symptoms for any changes. Tell your health care provider about them. Follow these instructions to care for yourself at home. Eating and drinking  Take an oral rehydration solution (ORS). This is a drink that is sold at pharmacies and retail stores.  Drink clear fluids slowly and in small amounts as you are able. Clear fluids include water, ice chips, low-calorie sports drinks, and fruit juice that has water added (diluted fruit juice).  Eat bland, easy-to-digest foods in small amounts as you are able. These foods include bananas, applesauce, rice, lean meats, toast, and crackers.  Avoid fluids that contain a lot of sugar or caffeine, such as energy drinks, sports drinks, and soda.  Avoid alcohol.  Avoid spicy or fatty foods.      General instructions  Take over-the-counter and prescription medicines only as told by your health care provider.  Drink enough fluid to keep your urine pale yellow.  Wash your hands often using soap and water. If soap and water are not available, use hand sanitizer.  Make sure that all people in your  household wash their hands well and often.  Rest at home while you recover.  Watch your condition for any changes.  Breathe slowly and deeply when you feel nauseated.  Keep all follow-up visits as told by your health care provider. This is important. Contact a health care provider if:  Your symptoms get worse.  You have new symptoms.  You have a fever.  You cannot drink fluids without vomiting.  Your nausea does not go away after 2 days.  You feel light-headed or dizzy.  You have a headache.  You have muscle cramps.  You have a rash.  You have pain while urinating. Get help right away if:  You have pain in your chest, neck, arm, or jaw.  You feel extremely weak or you faint.  You have persistent vomiting.  You have vomit that is bright red or looks like black coffee grounds.  You have bloody or black stools or stools that look like tar.  You have a severe headache, a stiff neck, or both.  You have severe pain, cramping, or bloating in your abdomen.  You have difficulty breathing, or you are breathing very quickly.  Your heart is beating very quickly.  Your skin feels cold and clammy.  You feel confused.  You have signs of dehydration, such as: ? Dark urine, very little urine, or no urine. ? Cracked lips. ? Dry mouth. ? Sunken eyes. ? Sleepiness. ? Weakness. These symptoms may represent a serious problem that is  an emergency. Do not wait to see if the symptoms will go away. Get medical help right away. Call your local emergency services (911 in the [place] Do not drive yourself to the hospital. Summary  Nausea is the feeling that you have an upset stomach or that you are about to vomit. As nausea gets worse, it can lead to vomiting. Vomiting can make you feel weak and cause you to become dehydrated.  Follow instructions from your health care provider about eating and drinking to prevent dehydration.  Take over-the-counter and prescription medicines  only as told by your health care provider.  Contact your health care provider if your symptoms get worse, or you have new symptoms.  Keep all follow-up visits as told by your health care provider. This is important. This information is not intended to replace advice given to you by your health care provider. Make sure you discuss any questions you have with your health care provider. Document Revised: 09/13/2018 Document Reviewed: 10/30/2017 Elsevier Patient Education  2021 [employer].

## 2020-09-13 NOTE — Progress Notes (Signed)
Subjective:    Patient ID: Marissa Joseph, female    DOB: 1981-05-06, 40 y.o.   MRN: [id number]  HPI The patient is here for an acute visit.   Her symptoms started yesterday.  She has nausea and been vomiting.  First thing in the morning she felt nauseous and did vomit a little bit and then she was okay most of the day.  She started vomiting again this morning after eating.  She states non-bloody vomiting.  She has not been able to keep anything down. No abdominal pain  - just irritation from vomiting.   She is not drinking much and if she does it comes right back up.  She tried to eat an apple today - had 4 bites - it came back up.    No sick contacts.  She thinks it started from salmon she ate Sat morning.  Her mom ate the salmon too but has no symptoms.  She is taking a couple new supplements and not sure if that is the cause.     Medications and allergies reviewed with patient and updated if appropriate.  Patient Active Problem List   Diagnosis Date Noted  . Swollen joint 06/09/2020  . Infected cyst of skin 06/09/2020  . Bilateral leg edema 03/31/2020  . Generalized abdominal pain 02/04/2020  . Recurrent cellulitis of lower extremity 12/17/2019  . Primary osteoarthritis of right knee 07/29/2019  . Acute lateral meniscus tear of right knee 07/29/2019  . Acute medial meniscus tear of right knee 07/29/2019  . Morbid obesity with BMI of 50.0-59.9, adult ([place]) 07/29/2019  . Muscular pain 11/13/2018  . Scapular dyskinesis 10/07/2018  . Acute back pain 09/25/2018  . Adjustment disorder with mixed anxiety and depressed mood 05/10/2018  . Amenorrhea 10/10/2017  . Ventral hernia without obstruction or gangrene 07/13/2017  . Hearing difficulty 11/24/2016  . Prediabetes 09/06/2015  . Essential hypertension, benign 09/06/2015  . Smoking 12/22/2013  . Osteoarthritis of left knee 08/29/2013  . Pap smear of cervix with ASCUS, cannot exclude HGSIL 06/25/2013  . Fibroid uterus  05/22/2013  . Dysmenorrhea 05/22/2013  . Monilial intertrigo 01/06/2013  . ALLERGIC RHINITIS 04/10/2008  . HEEL PAIN, LEFT 11/22/2007  . Chronic venous insufficiency of lower extremity 08/06/2007  . Obstructive sleep apnea 03/25/2007  . Eczema 02/21/2007  . Asthma 01/21/2007    Current Outpatient Medications on File Prior to Visit  Medication Sig Dispense Refill  . albuterol (VENTOLIN HFA) 108 (90 Base) MCG/ACT inhaler Inhale 1-2 puffs into the lungs every 6 (six) hours as needed for wheezing. 18 g 6  . bacitracin 500 UNIT/GM ointment Apply 1 application topically 2 (two) times daily. 30 g 0  . bisacodyl (DULCOLAX) 5 MG EC tablet Take 1 tablet (5 mg total) by mouth daily as needed for moderate constipation. 30 tablet 0  . budesonide-formoterol (SYMBICORT) 80-4.5 MCG/ACT inhaler Inhale 2 puffs into the lungs 2 (two) times daily. 1 Inhaler 3  . buPROPion (WELLBUTRIN XL) 150 MG 24 hr tablet Take 1 tablet (150 mg total) by mouth daily. 30 tablet 5  . diclofenac Sodium (VOLTAREN) 1 % GEL Apply 4 g topically 4 (four) times daily as needed. 150 g 0  . escitalopram (LEXAPRO) 20 MG tablet Take 1 tablet (20 mg total) by mouth daily. 90 tablet 1  . furosemide (LASIX) 20 MG tablet Take 1 tablet (20 mg total) by mouth daily as needed. 90 tablet 1  . gabapentin (NEURONTIN) 100 MG capsule Take 2 capsules (  200 mg total) by mouth at bedtime. 60 capsule 3  . montelukast (SINGULAIR) 10 MG tablet Take 1 tablet (10 mg total) by mouth at bedtime. 90 tablet 1  . olmesartan-hydrochlorothiazide (BENICAR HCT) 20-12.5 MG tablet Take 1 tablet by mouth daily. 90 tablet 1  . polyethylene glycol powder (GLYCOLAX/MIRALAX) 17 GM/SCOOP powder Take 17 g by mouth 2 (two) times daily as needed for severe constipation. 116 g 1   No current facility-administered medications on file prior to visit.    Past Medical History:  Diagnosis Date  . Anxiety    no official dx  . Asthma   . Eczema   . Fibroids   . GONORRHEA  03/25/2008   Qualifier: Diagnosis of  By: Isla Pence    . Heart murmur   . Heart murmur   . Hypertension   . MRSA (methicillin resistant staph aureus) culture positive 05/2012   cellulits/ left leg  . Obesity   . Sleep apnea     Past Surgical History:  Procedure Laterality Date  . NO PAST SURGERIES    . TEE WITHOUT CARDIOVERSION N/A 12/22/2019   Procedure: TRANSESOPHAGEAL ECHOCARDIOGRAM (TEE);  Surgeon: Donato Heinz, MD;  Location: [facility] ENDOSCOPY;  Service: Cardiovascular;  Laterality: N/A;    Social History   Socioeconomic History  . Marital status: Single    Spouse name: Not on file  . Number of children: Not on file  . Years of education: Not on file  . Highest education level: Not on file  Occupational History  . Not on file  Tobacco Use  . Smoking status: Current Every Day Smoker    Packs/day: 0.20    Years: 5.00    Pack years: 1.00    Types: Cigarettes  . Smokeless tobacco: Never Used  Vaping Use  . Vaping Use: Every day  Substance and Sexual Activity  . Alcohol use: Yes    Alcohol/week: 0.0 standard drinks    Comment: occ  . Drug use: Yes    Types: Marijuana    Comment: pot-about 1 joint BID  . Sexual activity: Yes    Birth control/protection: Condom  Other Topics Concern  . Not on file  Social History Narrative  . Not on file   Social Determinants of Health   Financial Resource Strain: Not on file  Food Insecurity: Not on file  Transportation Needs: Not on file  Physical Activity: Not on file  Stress: Not on file  Social Connections: Not on file    Family History  Problem Relation Age of Onset  . Heart disease Maternal Grandmother   . Diabetes Maternal Grandmother   . Diabetes Father   . Diabetes Mother   . Cancer Maternal Uncle        prostate  . Cancer Paternal Aunt        lung  . Stroke Maternal Grandfather     Review of Systems  Constitutional: Positive for diaphoresis and fatigue (generalized weakness). Negative for  fever.  HENT: Negative for congestion, sinus pain and sore throat.   Gastrointestinal: Positive for nausea and vomiting. Negative for abdominal pain (irritation in upper abdomen), blood in stool, constipation and diarrhea.  Genitourinary: Negative for dysuria and hematuria.  Neurological: Positive for light-headedness. Negative for headaches.       Objective:   Vitals:   09/13/20 1432  BP: 140/82  Pulse: 78  Temp: 98.5 F (36.9 C)  SpO2: 98%   BP Readings from Last 3 Encounters:  09/13/20 140/82  09/02/20 114/78  07/28/20 120/76   Wt Readings from Last 3 Encounters:  09/13/20 (!) 311 lb 12.8 oz (141.4 kg)  09/02/20 (!) 314 lb (142.4 kg)  07/28/20 (!) 324 lb 6.4 oz (147.1 kg)   Body mass index is 53.52 kg/m.   Physical Exam Constitutional:      General: She is not in acute distress.    Appearance: Normal appearance. She is ill-appearing (vomiting intermittently, mild ill-appearing). She is not toxic-appearing or diaphoretic.  HENT:     Head: Normocephalic and atraumatic.  Abdominal:     Palpations: Abdomen is soft.     Tenderness: There is no abdominal tenderness. There is no guarding or rebound.     Comments: Obese  Skin:    General: Skin is warm and dry.  Neurological:     Mental Status: She is alert.            Assessment & Plan:    See Problem List for Assessment and Plan of chronic medical problems.    This visit occurred during the SARS-CoV-2 public health emergency.  Safety protocols were in place, including screening questions prior to the visit, additional usage of staff PPE, and extensive cleaning of exam room while observing appropriate contact time as indicated for disinfecting solutions.

## 2020-09-15 ENCOUNTER — Encounter: Payer: Self-pay | Admitting: Internal Medicine

## 2020-09-15 NOTE — Telephone Encounter (Signed)
Patient calling, states she feels even worse right now with nausea and constipation.  Seeking advice

## 2020-09-17 ENCOUNTER — Encounter: Payer: Self-pay | Admitting: Internal Medicine

## 2020-09-21 NOTE — Telephone Encounter (Signed)
ondansetron (ZOFRAN ODT) 8 MG disintegrating tablet Patient wondering if she can get a refill of this medication

## 2020-09-22 MED ORDER — ONDANSETRON 8 MG PO TBDP: 8.0000 mg | ORAL_TABLET | Freq: Three times a day (TID) | ORAL | 0 refills | Status: DC | PRN

## 2020-09-22 NOTE — Addendum Note (Signed)
Addended by: Binnie Rail on: 09/22/2020 11:01 AM   Modules accepted: Orders

## 2020-11-09 ENCOUNTER — Encounter: Payer: Self-pay | Admitting: Internal Medicine

## 2020-11-10 ENCOUNTER — Other Ambulatory Visit: Payer: Self-pay

## 2020-11-10 ENCOUNTER — Ambulatory Visit (INDEPENDENT_AMBULATORY_CARE_PROVIDER_SITE_OTHER): Payer: PRIVATE HEALTH INSURANCE | Admitting: Internal Medicine

## 2020-11-10 ENCOUNTER — Encounter: Payer: Self-pay | Admitting: Internal Medicine

## 2020-11-10 VITALS — BP 132/88 | HR 78 | Temp 98.4°F | Ht 64.0 in | Wt 318.0 lb

## 2020-11-10 DIAGNOSIS — M79644 Pain in right finger(s): Secondary | ICD-10-CM | POA: Insufficient documentation

## 2020-11-10 MED ORDER — INDOMETHACIN 50 MG PO CAPS: 50.0000 mg | ORAL_CAPSULE | Freq: Three times a day (TID) | ORAL | 0 refills | Status: DC

## 2020-11-10 NOTE — Patient Instructions (Signed)
   Take the indomethacin three times a day with food x 10 days.  Do not take any other advil or aleve with it.    A referral was ordered for a hand specialist.

## 2020-11-10 NOTE — Progress Notes (Signed)
Subjective:    Patient ID: Marissa Joseph, female    DOB: 12/17/1980, 40 y.o.   MRN: [id number]  HPI The patient is here for an acute visit.   Right middle finger - started about April.  We did discuss it at her last visit.  She has had the pain intermittently since then, but the pain has been bad for the past month.  At that time I was concerned about possible gout versus pseudogout and prescribed indomethacin 3 times daily for 5 days.  She states it did help.  She describes the pain going from the fingertip all the way to the MCP joint.  Sometimes the pain radiates down toward the wrist.  She denies any pain in any of her other fingers.  It hurts when she tries to extend the finger.  The MCP joint feels swollen and looks swollen at times.  She describes the pain as a sharp pain and sometimes pulling when she tries to extend the finger.  The pain is intermittent.  Nothing seems to make it worse-she braids hair a lot and that does not make it worse.  She has noted the gabapentin does help.  It has gotten worse-occurring more often and more severe pain.      Medications and allergies reviewed with patient and updated if appropriate.  Patient Active Problem List   Diagnosis Date Noted  . Nausea and vomiting 09/13/2020  . Swollen joint 06/09/2020  . Infected cyst of skin 06/09/2020  . Bilateral leg edema 03/31/2020  . Generalized abdominal pain 02/04/2020  . Recurrent cellulitis of lower extremity 12/17/2019  . Primary osteoarthritis of right knee 07/29/2019  . Acute lateral meniscus tear of right knee 07/29/2019  . Acute medial meniscus tear of right knee 07/29/2019  . Morbid obesity with BMI of 50.0-59.9, adult ([place]) 07/29/2019  . Muscular pain 11/13/2018  . Scapular dyskinesis 10/07/2018  . Acute back pain 09/25/2018  . Adjustment disorder with mixed anxiety and depressed mood 05/10/2018  . Amenorrhea 10/10/2017  . Ventral hernia without obstruction or gangrene  07/13/2017  . Hearing difficulty 11/24/2016  . Prediabetes 09/06/2015  . Essential hypertension, benign 09/06/2015  . Smoking 12/22/2013  . Osteoarthritis of left knee 08/29/2013  . Pap smear of cervix with ASCUS, cannot exclude HGSIL 06/25/2013  . Fibroid uterus 05/22/2013  . Dysmenorrhea 05/22/2013  . Monilial intertrigo 01/06/2013  . ALLERGIC RHINITIS 04/10/2008  . HEEL PAIN, LEFT 11/22/2007  . Chronic venous insufficiency of lower extremity 08/06/2007  . Obstructive sleep apnea 03/25/2007  . Eczema 02/21/2007  . Asthma 01/21/2007    Current Outpatient Medications on File Prior to Visit  Medication Sig Dispense Refill  . albuterol (VENTOLIN HFA) 108 (90 Base) MCG/ACT inhaler Inhale 1-2 puffs into the lungs every 6 (six) hours as needed for wheezing. 18 g 6  . bacitracin 500 UNIT/GM ointment Apply 1 application topically 2 (two) times daily. 30 g 0  . bisacodyl (DULCOLAX) 5 MG EC tablet Take 1 tablet (5 mg total) by mouth daily as needed for moderate constipation. 30 tablet 0  . budesonide-formoterol (SYMBICORT) 80-4.5 MCG/ACT inhaler Inhale 2 puffs into the lungs 2 (two) times daily. 1 Inhaler 3  . buPROPion (WELLBUTRIN XL) 150 MG 24 hr tablet Take 1 tablet (150 mg total) by mouth daily. 30 tablet 5  . diclofenac Sodium (VOLTAREN) 1 % GEL Apply 4 g topically 4 (four) times daily as needed. 150 g 0  . escitalopram (LEXAPRO) 20 MG tablet Take  1 tablet (20 mg total) by mouth daily. 90 tablet 1  . furosemide (LASIX) 20 MG tablet Take 1 tablet (20 mg total) by mouth daily as needed. 90 tablet 1  . gabapentin (NEURONTIN) 100 MG capsule Take 2 capsules (200 mg total) by mouth at bedtime. 60 capsule 3  . montelukast (SINGULAIR) 10 MG tablet Take 1 tablet (10 mg total) by mouth at bedtime. 90 tablet 1  . olmesartan-hydrochlorothiazide (BENICAR HCT) 20-12.5 MG tablet Take 1 tablet by mouth daily. 90 tablet 1  . ondansetron (ZOFRAN ODT) 8 MG disintegrating tablet Take 1 tablet (8 mg total) by  mouth every 8 (eight) hours as needed for nausea or vomiting. 20 tablet 0  . polyethylene glycol powder (GLYCOLAX/MIRALAX) 17 GM/SCOOP powder Take 17 g by mouth 2 (two) times daily as needed for severe constipation. 116 g 1   No current facility-administered medications on file prior to visit.    Past Medical History:  Diagnosis Date  . Anxiety    no official dx  . Asthma   . Eczema   . Fibroids   . GONORRHEA 03/25/2008   Qualifier: Diagnosis of  By: Isla Pence    . Heart murmur   . Heart murmur   . Hypertension   . MRSA (methicillin resistant staph aureus) culture positive 05/2012   cellulits/ left leg  . Obesity   . Sleep apnea     Past Surgical History:  Procedure Laterality Date  . NO PAST SURGERIES    . TEE WITHOUT CARDIOVERSION N/A 12/22/2019   Procedure: TRANSESOPHAGEAL ECHOCARDIOGRAM (TEE);  Surgeon: Donato Heinz, MD;  Location: [facility] ENDOSCOPY;  Service: Cardiovascular;  Laterality: N/A;    Social History   Socioeconomic History  . Marital status: Single    Spouse name: Not on file  . Number of children: Not on file  . Years of education: Not on file  . Highest education level: Not on file  Occupational History  . Not on file  Tobacco Use  . Smoking status: Current Every Day Smoker    Packs/day: 0.20    Years: 5.00    Pack years: 1.00    Types: Cigarettes  . Smokeless tobacco: Never Used  Vaping Use  . Vaping Use: Every day  Substance and Sexual Activity  . Alcohol use: Yes    Alcohol/week: 0.0 standard drinks    Comment: occ  . Drug use: Yes    Types: Marijuana    Comment: pot-about 1 joint BID  . Sexual activity: Yes    Birth control/protection: Condom  Other Topics Concern  . Not on file  Social History Narrative  . Not on file   Social Determinants of Health   Financial Resource Strain: Not on file  Food Insecurity: Not on file  Transportation Needs: Not on file  Physical Activity: Not on file  Stress: Not on file   Social Connections: Not on file    Family History  Problem Relation Age of Onset  . Heart disease Maternal Grandmother   . Diabetes Maternal Grandmother   . Diabetes Father   . Diabetes Mother   . Cancer Maternal Uncle        prostate  . Cancer Paternal Aunt        lung  . Stroke Maternal Grandfather     Review of Systems     Objective:   Vitals:   11/10/20 1554  BP: 132/88  Pulse: 78  Temp: 98.4 F (36.9 C)  SpO2: 99%  BP Readings from Last 3 Encounters:  11/10/20 132/88  09/13/20 140/82  09/02/20 114/78   Wt Readings from Last 3 Encounters:  11/10/20 (!) 318 lb (144.2 kg)  09/13/20 (!) 311 lb 12.8 oz (141.4 kg)  09/02/20 (!) 314 lb (142.4 kg)   Body mass index is 54.58 kg/m.   Physical Exam Constitutional:      General: She is not in acute distress.    Appearance: Normal appearance. She is not ill-appearing.  HENT:     Head: Normocephalic and atraumatic.  Musculoskeletal:        General: Tenderness (Tenderness with palpation right middle finger MCP joint.  Pain with active and passive movement of finger.  Normal sensation in finger.  No pain in DIP or PIP joint.  No wrist pain.  No pain with palpation along finger.) present.  Skin:    General: Skin is warm and dry.     Findings: No bruising or erythema.  Neurological:     Mental Status: She is alert.            Assessment & Plan:    See Problem List for Assessment and Plan of chronic medical problems.    This visit occurred during the SARS-CoV-2 public health emergency.  Safety protocols were in place, including screening questions prior to the visit, additional usage of staff PPE, and extensive cleaning of exam room while observing appropriate contact time as indicated for disinfecting solutions.

## 2020-11-10 NOTE — Assessment & Plan Note (Addendum)
Subacute Started around April and has gotten worse since then In April he did seem to respond to indomethacin 50 mg 3 times daily x5 days, but has recurred and it is getting worse ?  Pseudogout Will prescribe indomethacin 50 mg 3 times daily x10 days to see if that helps more Advised that she cannot take any other NSAIDs while taking the indomethacin Okay to take gabapentin since that also helps-she is taking up or something else currently Will refer to a hand specialist

## 2020-11-17 ENCOUNTER — Encounter: Payer: Self-pay | Admitting: Orthopaedic Surgery

## 2020-11-17 ENCOUNTER — Ambulatory Visit (INDEPENDENT_AMBULATORY_CARE_PROVIDER_SITE_OTHER): Payer: PRIVATE HEALTH INSURANCE | Admitting: Orthopaedic Surgery

## 2020-11-17 ENCOUNTER — Other Ambulatory Visit: Payer: Self-pay

## 2020-11-17 ENCOUNTER — Ambulatory Visit: Payer: Self-pay

## 2020-11-17 DIAGNOSIS — M79644 Pain in right finger(s): Secondary | ICD-10-CM

## 2020-11-17 MED ORDER — PREDNISONE 5 MG (21) PO TBPK: ORAL_TABLET | ORAL | 0 refills | Status: DC

## 2020-11-17 NOTE — Progress Notes (Signed)
Office Visit Note   Patient: Marissa Joseph           Date of Birth: Nov 19, 1980           MRN: [id number] Visit Date: 11/17/2020              Requested by: Binnie Rail, MD [place],  [place] [address] PCP: Binnie Rail, MD   Assessment & Plan: Visit Diagnoses:  1. Finger pain, right     Plan: Impression is right long finger stenosing tenosynovitis.  We have discussed various treatment options to include cortisone injection versus steroid pack and night splinting.  She would like to try the steroid pack and the night splint for now.  If her symptoms do not improve or worsen over the next few weeks she will call us and let us know.  Call with concerns or questions in the meantime.  Follow-Up Instructions: Return if symptoms worsen or fail to improve.   Orders:  Orders Placed This Encounter  Procedures   XR Finger Middle Right   No orders of the defined types were placed in this encounter.     Procedures: No procedures performed   Clinical Data: No additional findings.   Subjective: Chief Complaint  Patient presents with   Right Middle Finger - Pain    HPI patient is a pleasant 40 year old right-hand-dominant female who comes in today with pain and stiffness to the right long finger.  She denies any specific injury but does note that she braids here on a daily basis.  The pain is primarily along the A1 pulley.  She denies any locking or triggering to the long finger.  No history of trigger finger.  She has recently been taking indomethacin prescribed by her PCP which does seem to help.  She denies any paresthesias to the right upper extremity.  No history of diabetes.  No personal or family history of autoimmune disease to include rheumatoid arthritis.  Review of Systems as detailed in HPI.  All others reviewed and are negative.   Objective: Vital Signs: There were no vitals taken for this visit.  Physical Exam well-developed well-nourished  female in no acute distress.  Alert and oriented x3.  Ortho Exam right long finger exam shows no swelling.  He does have mild tenderness along the A1 pulley without a palpable nodule.  No reproducible triggering.  Full range of motion, although she does have stiffness with the extremes of extension.  She is neurovascular intact distally.  Specialty Comments:  No specialty comments available.  Imaging: XR Finger Middle Right  Result Date: 11/17/2020 X-rays demonstrate a very small avulsion to the distal phalanx at the DIP joint    PMFS History: Patient Active Problem List   Diagnosis Date Noted   Finger pain, right 11/10/2020   Nausea and vomiting 09/13/2020   Swollen joint 06/09/2020   Infected cyst of skin 06/09/2020   Bilateral leg edema 03/31/2020   Generalized abdominal pain 02/04/2020   Recurrent cellulitis of lower extremity 12/17/2019   Primary osteoarthritis of right knee 07/29/2019   Acute lateral meniscus tear of right knee 07/29/2019   Acute medial meniscus tear of right knee 07/29/2019   Morbid obesity with BMI of 50.0-59.9, adult ([place]) 07/29/2019   Muscular pain 11/13/2018   Scapular dyskinesis 10/07/2018   Acute back pain 09/25/2018   Adjustment disorder with mixed anxiety and depressed mood 05/10/2018   Amenorrhea 10/10/2017   Ventral hernia without obstruction or  gangrene 07/13/2017   Hearing difficulty 11/24/2016   Prediabetes 09/06/2015   Essential hypertension, benign 09/06/2015   Smoking 12/22/2013   Osteoarthritis of left knee 08/29/2013   Pap smear of cervix with ASCUS, cannot exclude HGSIL 06/25/2013   Fibroid uterus 05/22/2013   Dysmenorrhea 05/22/2013   Monilial intertrigo 01/06/2013   ALLERGIC RHINITIS 04/10/2008   HEEL PAIN, LEFT 11/22/2007   Chronic venous insufficiency of lower extremity 08/06/2007   Obstructive sleep apnea 03/25/2007   Eczema 02/21/2007   Asthma 01/21/2007   Past Medical History:  Diagnosis Date   Anxiety    no  official dx   Asthma    Eczema    Fibroids    GONORRHEA 03/25/2008   Qualifier: Diagnosis of  By: Isla Pence     Heart murmur    Heart murmur    Hypertension    MRSA (methicillin resistant staph aureus) culture positive 05/2012   cellulits/ left leg   Obesity    Sleep apnea     Family History  Problem Relation Age of Onset   Heart disease Maternal Grandmother    Diabetes Maternal Grandmother    Diabetes Father    Diabetes Mother    Cancer Maternal Uncle        prostate   Cancer Paternal Aunt        lung   Stroke Maternal Grandfather     Past Surgical History:  Procedure Laterality Date   NO PAST SURGERIES     TEE WITHOUT CARDIOVERSION N/A 12/22/2019   Procedure: TRANSESOPHAGEAL ECHOCARDIOGRAM (TEE);  Surgeon: Donato Heinz, MD;  Location: [facility] ENDOSCOPY;  Service: Cardiovascular;  Laterality: N/A;   Social History   Occupational History   Not on file  Tobacco Use   Smoking status: Every Day    Packs/day: 0.20    Years: 5.00    Pack years: 1.00    Types: Cigarettes   Smokeless tobacco: Never  Vaping Use   Vaping Use: Every day  Substance and Sexual Activity   Alcohol use: Yes    Alcohol/week: 0.0 standard drinks    Comment: occ   Drug use: Yes    Types: Marijuana    Comment: pot-about 1 joint BID   Sexual activity: Yes    Birth control/protection: Condom

## 2020-11-24 ENCOUNTER — Encounter: Payer: Self-pay | Admitting: Internal Medicine

## 2020-12-13 ENCOUNTER — Encounter: Payer: Self-pay | Admitting: Internal Medicine

## 2020-12-13 ENCOUNTER — Other Ambulatory Visit: Payer: Self-pay

## 2020-12-13 IMAGING — DX DG CHEST 1V PORT
1 series · 1 of 1 positions shown · non-contrast
Comparison: March 29, 2015.

CLINICAL DATA: Cough, 6O2IC-C9 positive.

EXAM:
PORTABLE CHEST 1 VIEW

[chest ap]
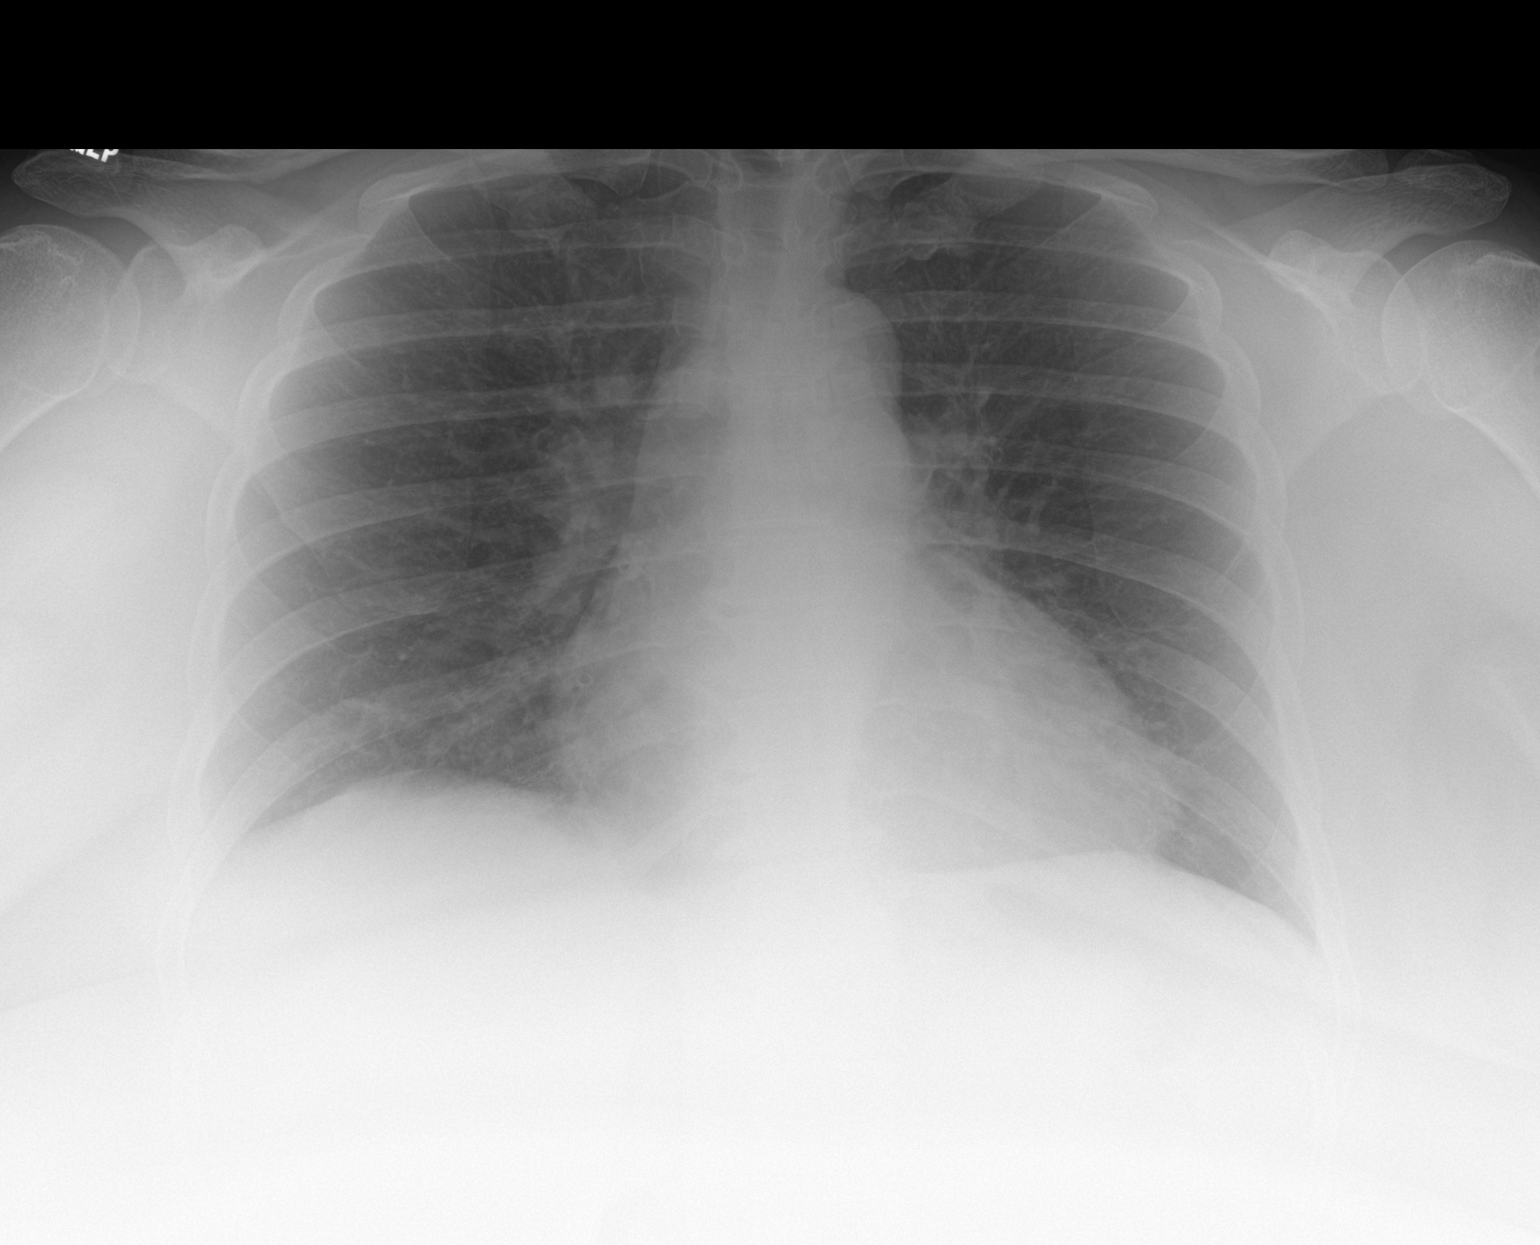

[1 of 1 positions shown; findings below may reference images not displayed]

FINDINGS: The heart size and mediastinal contours are within normal limits.
Both lungs are clear. The visualized skeletal structures are
unremarkable.
IMPRESSION: No active disease.

## 2020-12-13 MED ORDER — OLMESARTAN MEDOXOMIL-HCTZ 20-12.5 MG PO TABS: 1.0000 | ORAL_TABLET | Freq: Every day | ORAL | 1 refills | Status: DC

## 2020-12-31 ENCOUNTER — Encounter: Payer: Self-pay | Admitting: Internal Medicine

## 2020-12-31 ENCOUNTER — Telehealth: Payer: Self-pay | Admitting: Internal Medicine

## 2020-12-31 NOTE — Telephone Encounter (Signed)
Patient calling in about MyChart message she sent this morning  Patient is req callback [phone] or response to MyChart message

## 2020-12-31 NOTE — Telephone Encounter (Signed)
Scheduling patient for appointment on Monday.

## 2021-01-03 ENCOUNTER — Other Ambulatory Visit: Payer: Self-pay

## 2021-01-03 ENCOUNTER — Ambulatory Visit (INDEPENDENT_AMBULATORY_CARE_PROVIDER_SITE_OTHER): Payer: PRIVATE HEALTH INSURANCE | Admitting: Internal Medicine

## 2021-01-03 ENCOUNTER — Encounter: Payer: Self-pay | Admitting: Internal Medicine

## 2021-01-03 DIAGNOSIS — L03119 Cellulitis of unspecified part of limb: Secondary | ICD-10-CM | POA: Diagnosis not present

## 2021-01-03 DIAGNOSIS — I10 Essential (primary) hypertension: Secondary | ICD-10-CM | POA: Diagnosis not present

## 2021-01-03 NOTE — Progress Notes (Signed)
Subjective:    Patient ID: Marissa Joseph, female    DOB: 1980/09/18, 40 y.o.   MRN: [id number]  HPI The patient is here for an acute visit.  ?  Left lower extremity cellulitis- three days ago noticed LLE swelling.   There was no pain, but the leg felt different in a certain area..  She has had cellulitis in that leg twice in the past and the symptoms were similar to that.  She denies any fever.   7/29 - [facility] -- started bactrim DS bid x 10 days.  It is helping-symptoms improving.    Medications and allergies reviewed with patient and updated if appropriate.  Patient Active Problem List   Diagnosis Date Noted   Finger pain, right 11/10/2020   Nausea and vomiting 09/13/2020   Swollen joint 06/09/2020   Infected cyst of skin 06/09/2020   Bilateral leg edema 03/31/2020   Generalized abdominal pain 02/04/2020   Recurrent cellulitis of lower extremity 12/17/2019   Primary osteoarthritis of right knee 07/29/2019   Acute lateral meniscus tear of right knee 07/29/2019   Acute medial meniscus tear of right knee 07/29/2019   Morbid obesity with BMI of 50.0-59.9, adult ([place]) 07/29/2019   Muscular pain 11/13/2018   Scapular dyskinesis 10/07/2018   Acute back pain 09/25/2018   Adjustment disorder with mixed anxiety and depressed mood 05/10/2018   Amenorrhea 10/10/2017   Ventral hernia without obstruction or gangrene 07/13/2017   Hearing difficulty 11/24/2016   Prediabetes 09/06/2015   Essential hypertension, benign 09/06/2015   Smoking 12/22/2013   Osteoarthritis of left knee 08/29/2013   Pap smear of cervix with ASCUS, cannot exclude HGSIL 06/25/2013   Fibroid uterus 05/22/2013   Dysmenorrhea 05/22/2013   Monilial intertrigo 01/06/2013   ALLERGIC RHINITIS 04/10/2008   HEEL PAIN, LEFT 11/22/2007   Chronic venous insufficiency of lower extremity 08/06/2007   Obstructive sleep apnea 03/25/2007   Eczema 02/21/2007   Asthma 01/21/2007    Current Outpatient  Medications on File Prior to Visit  Medication Sig Dispense Refill   albuterol (VENTOLIN HFA) 108 (90 Base) MCG/ACT inhaler Inhale 1-2 puffs into the lungs every 6 (six) hours as needed for wheezing. 18 g 6   bacitracin 500 UNIT/GM ointment Apply 1 application topically 2 (two) times daily. 30 g 0   bisacodyl (DULCOLAX) 5 MG EC tablet Take 1 tablet (5 mg total) by mouth daily as needed for moderate constipation. 30 tablet 0   budesonide-formoterol (SYMBICORT) 80-4.5 MCG/ACT inhaler Inhale 2 puffs into the lungs 2 (two) times daily. 1 Inhaler 3   buPROPion (WELLBUTRIN XL) 150 MG 24 hr tablet Take 1 tablet (150 mg total) by mouth daily. 30 tablet 5   diclofenac Sodium (VOLTAREN) 1 % GEL Apply 4 g topically 4 (four) times daily as needed. 150 g 0   escitalopram (LEXAPRO) 20 MG tablet Take 1 tablet (20 mg total) by mouth daily. 90 tablet 1   furosemide (LASIX) 20 MG tablet Take 1 tablet (20 mg total) by mouth daily as needed. 90 tablet 1   gabapentin (NEURONTIN) 100 MG capsule Take 2 capsules (200 mg total) by mouth at bedtime. 60 capsule 3   indomethacin (INDOCIN) 50 MG capsule Take 1 capsule (50 mg total) by mouth 3 (three) times daily with meals. 30 capsule 0   montelukast (SINGULAIR) 10 MG tablet Take 1 tablet (10 mg total) by mouth at bedtime. 90 tablet 1   olmesartan-hydrochlorothiazide (BENICAR HCT) 20-12.5 MG tablet Take  1 tablet by mouth daily. 90 tablet 1   ondansetron (ZOFRAN ODT) 8 MG disintegrating tablet Take 1 tablet (8 mg total) by mouth every 8 (eight) hours as needed for nausea or vomiting. 20 tablet 0   polyethylene glycol powder (GLYCOLAX/MIRALAX) 17 GM/SCOOP powder Take 17 g by mouth 2 (two) times daily as needed for severe constipation. 116 g 1   predniSONE (STERAPRED UNI-PAK 21 TAB) 5 MG (21) TBPK tablet Take as directed 21 tablet 0   No current facility-administered medications on file prior to visit.    Past Medical History:  Diagnosis Date   Anxiety    no official dx    Asthma    Eczema    Fibroids    GONORRHEA 03/25/2008   Qualifier: Diagnosis of  By: Isla Pence     Heart murmur    Heart murmur    Hypertension    MRSA (methicillin resistant staph aureus) culture positive 05/2012   cellulits/ left leg   Obesity    Sleep apnea     Past Surgical History:  Procedure Laterality Date   NO PAST SURGERIES     TEE WITHOUT CARDIOVERSION N/A 12/22/2019   Procedure: TRANSESOPHAGEAL ECHOCARDIOGRAM (TEE);  Surgeon: Donato Heinz, MD;  Location: [facility] ENDOSCOPY;  Service: Cardiovascular;  Laterality: N/A;    Social History   Socioeconomic History   Marital status: Single    Spouse name: Not on file   Number of children: Not on file   Years of education: Not on file   Highest education level: Not on file  Occupational History   Not on file  Tobacco Use   Smoking status: Every Day    Packs/day: 0.20    Years: 5.00    Pack years: 1.00    Types: Cigarettes   Smokeless tobacco: Never  Vaping Use   Vaping Use: Every day  Substance and Sexual Activity   Alcohol use: Yes    Alcohol/week: 0.0 standard drinks    Comment: occ   Drug use: Yes    Types: Marijuana    Comment: pot-about 1 joint BID   Sexual activity: Yes    Birth control/protection: Condom  Other Topics Concern   Not on file  Social History Narrative   Not on file   Social Determinants of Health   Financial Resource Strain: Not on file  Food Insecurity: Not on file  Transportation Needs: Not on file  Physical Activity: Not on file  Stress: Not on file  Social Connections: Not on file    Family History  Problem Relation Age of Onset   Heart disease Maternal Grandmother    Diabetes Maternal Grandmother    Diabetes Father    Diabetes Mother    Cancer Maternal Uncle        prostate   Cancer Paternal Aunt        lung   Stroke Maternal Grandfather     Review of Systems  Constitutional:  Negative for chills and fever.  Cardiovascular:  Positive for leg swelling  (LLE > RLE).  Neurological:  Negative for numbness.      Objective:   Vitals:   01/03/21 1550  BP: 124/60  Pulse: 78  Temp: 98.7 F (37.1 C)  SpO2: 99%   BP Readings from Last 3 Encounters:  01/03/21 124/60  11/10/20 132/88  09/13/20 140/82   Wt Readings from Last 3 Encounters:  01/03/21 (!) 322 lb (146.1 kg)  11/10/20 (!) 318 lb (144.2 kg)  09/13/20 Marland Kitchen)  311 lb 12.8 oz (141.4 kg)   Body mass index is 55.27 kg/m.   Physical Exam Constitutional:      General: She is not in acute distress.    Appearance: Normal appearance. She is not ill-appearing.  Musculoskeletal:     Right lower leg: Edema (Minimal) present.     Left lower leg: Edema (Mild hyperpigmented skin with chronic skin thickening, area of increased thickening on the lateral lower leg, no fluctuance or abscess) present.  Skin:    General: Skin is warm and dry.     Findings: Rash (Dry patch left medial ankle-eczema) present. No erythema.  Neurological:     Mental Status: She is alert.           Assessment & Plan:    See Problem List for Assessment and Plan of chronic medical problems.    This visit occurred during the SARS-CoV-2 public health emergency.  Safety protocols were in place, including screening questions prior to the visit, additional usage of staff PPE, and extensive cleaning of exam room while observing appropriate contact time as indicated for disinfecting solutions.

## 2021-01-03 NOTE — Assessment & Plan Note (Signed)
Acute She did go to urgent care 3 days ago and was started on Bactrim DS twice daily x10 days and she will complete the entire 10 days Symptoms have improved since starting the antibiotic No other treatment needed at this time Stressed the importance of weight loss, regular exercise and using her compression socks-she is starting to develop lymphedema in the left lower extremity

## 2021-01-03 NOTE — Assessment & Plan Note (Signed)
Chronic BP well controlled Continue Benicar HCT 20-12.5 mg daily

## 2021-01-03 NOTE — Patient Instructions (Addendum)
  Continue and complete the Bactrim.

## 2021-01-05 ENCOUNTER — Ambulatory Visit: Payer: PRIVATE HEALTH INSURANCE | Admitting: Internal Medicine

## 2021-02-02 ENCOUNTER — Telehealth: Payer: Self-pay | Admitting: Orthopaedic Surgery

## 2021-02-10 ENCOUNTER — Encounter: Payer: Self-pay | Admitting: Internal Medicine

## 2021-02-11 ENCOUNTER — Other Ambulatory Visit: Payer: Self-pay

## 2021-02-13 ENCOUNTER — Encounter: Payer: Self-pay | Admitting: Internal Medicine

## 2021-02-13 NOTE — Progress Notes (Signed)
Subjective:    Patient ID: Marissa Joseph, female    DOB: 09-06-1980, 40 y.o.   MRN: [id number]  This visit occurred during the SARS-CoV-2 public health emergency.  Safety protocols were in place, including screening questions prior to the visit, additional usage of staff PPE, and extensive cleaning of exam room while observing appropriate contact time as indicated for disinfecting solutions.    HPI The patient is here for an acute visit.   She has blurry vision - more when she is at work - she is on the computer all the time.  The light bothers her eyes.  She had her eyes checked last month - some of the blurriness is related to an eye issues.   She is also concerned some of the blurriness is related to her sugars.  She gets lightheaded if she goes too long w/o eating or drinking.    She just started a keto diet.  She has been eating a lot of sugar.  Medications and allergies reviewed with patient and updated if appropriate.  Patient Active Problem List   Diagnosis Date Noted   Finger pain, right 11/10/2020   Nausea and vomiting 09/13/2020   Swollen joint 06/09/2020   Infected cyst of skin 06/09/2020   Bilateral leg edema 03/31/2020   Generalized abdominal pain 02/04/2020   Recurrent cellulitis of lower extremity 12/17/2019   Primary osteoarthritis of right knee 07/29/2019   Acute lateral meniscus tear of right knee 07/29/2019   Acute medial meniscus tear of right knee 07/29/2019   Morbid obesity with BMI of 50.0-59.9, adult ([place]) 07/29/2019   Muscular pain 11/13/2018   Scapular dyskinesis 10/07/2018   Acute back pain 09/25/2018   Adjustment disorder with mixed anxiety and depressed mood 05/10/2018   Amenorrhea 10/10/2017   Ventral hernia without obstruction or gangrene 07/13/2017   Hearing difficulty 11/24/2016   Prediabetes 09/06/2015   Essential hypertension, benign 09/06/2015   Smoking 12/22/2013   Osteoarthritis of left knee 08/29/2013   Pap smear of cervix  with ASCUS, cannot exclude HGSIL 06/25/2013   Fibroid uterus 05/22/2013   Dysmenorrhea 05/22/2013   Monilial intertrigo 01/06/2013   ALLERGIC RHINITIS 04/10/2008   HEEL PAIN, LEFT 11/22/2007   Chronic venous insufficiency of lower extremity 08/06/2007   Obstructive sleep apnea 03/25/2007   Eczema 02/21/2007   Asthma 01/21/2007    Current Outpatient Medications on File Prior to Visit  Medication Sig Dispense Refill   albuterol (VENTOLIN HFA) 108 (90 Base) MCG/ACT inhaler Inhale 1-2 puffs into the lungs every 6 (six) hours as needed for wheezing. 18 g 6   bisacodyl (DULCOLAX) 5 MG EC tablet Take 1 tablet (5 mg total) by mouth daily as needed for moderate constipation. 30 tablet 0   budesonide-formoterol (SYMBICORT) 80-4.5 MCG/ACT inhaler Inhale 2 puffs into the lungs 2 (two) times daily. 1 Inhaler 3   buPROPion (WELLBUTRIN XL) 150 MG 24 hr tablet Take 1 tablet (150 mg total) by mouth daily. 30 tablet 5   diclofenac Sodium (VOLTAREN) 1 % GEL Apply 4 g topically 4 (four) times daily as needed. 150 g 0   escitalopram (LEXAPRO) 20 MG tablet Take 1 tablet (20 mg total) by mouth daily. 90 tablet 1   furosemide (LASIX) 20 MG tablet Take 1 tablet (20 mg total) by mouth daily as needed. 90 tablet 1   gabapentin (NEURONTIN) 100 MG capsule Take 2 capsules (200 mg total) by mouth at bedtime. 60 capsule 3   indomethacin (INDOCIN) 50 MG  capsule Take 1 capsule (50 mg total) by mouth 3 (three) times daily with meals. 30 capsule 0   montelukast (SINGULAIR) 10 MG tablet Take 1 tablet (10 mg total) by mouth at bedtime. 90 tablet 1   olmesartan-hydrochlorothiazide (BENICAR HCT) 20-12.5 MG tablet Take 1 tablet by mouth daily. 90 tablet 1   ondansetron (ZOFRAN ODT) 8 MG disintegrating tablet Take 1 tablet (8 mg total) by mouth every 8 (eight) hours as needed for nausea or vomiting. 20 tablet 0   polyethylene glycol powder (GLYCOLAX/MIRALAX) 17 GM/SCOOP powder Take 17 g by mouth 2 (two) times daily as needed for  severe constipation. 116 g 1   No current facility-administered medications on file prior to visit.    Past Medical History:  Diagnosis Date   Anxiety    no official dx   Asthma    Eczema    Fibroids    GONORRHEA 03/25/2008   Qualifier: Diagnosis of  By: Isla Pence     Heart murmur    Heart murmur    Hypertension    MRSA (methicillin resistant staph aureus) culture positive 05/2012   cellulits/ left leg   Obesity    Sleep apnea     Past Surgical History:  Procedure Laterality Date   NO PAST SURGERIES     TEE WITHOUT CARDIOVERSION N/A 12/22/2019   Procedure: TRANSESOPHAGEAL ECHOCARDIOGRAM (TEE);  Surgeon: Donato Heinz, MD;  Location: [facility] ENDOSCOPY;  Service: Cardiovascular;  Laterality: N/A;    Social History   Socioeconomic History   Marital status: Single    Spouse name: Not on file   Number of children: Not on file   Years of education: Not on file   Highest education level: Not on file  Occupational History   Not on file  Tobacco Use   Smoking status: Every Day    Packs/day: 0.20    Years: 5.00    Pack years: 1.00    Types: Cigarettes   Smokeless tobacco: Never  Vaping Use   Vaping Use: Every day  Substance and Sexual Activity   Alcohol use: Yes    Alcohol/week: 0.0 standard drinks    Comment: occ   Drug use: Yes    Types: Marijuana    Comment: pot-about 1 joint BID   Sexual activity: Yes    Birth control/protection: Condom  Other Topics Concern   Not on file  Social History Narrative   Not on file   Social Determinants of Health   Financial Resource Strain: Not on file  Food Insecurity: Not on file  Transportation Needs: Not on file  Physical Activity: Not on file  Stress: Not on file  Social Connections: Not on file    Family History  Problem Relation Age of Onset   Heart disease Maternal Grandmother    Diabetes Maternal Grandmother    Diabetes Father    Diabetes Mother    Cancer Maternal Uncle        prostate    Cancer Paternal Aunt        lung   Stroke Maternal Grandfather     Review of Systems  Constitutional:  Negative for fever.  Eyes:  Positive for visual disturbance (blurry).  Respiratory:  Positive for shortness of breath. Negative for cough and wheezing.   Cardiovascular:  Negative for chest pain and palpitations.  Endocrine: Positive for polydipsia and polyuria (? related to lasix).  Neurological:  Positive for light-headedness (mild). Negative for headaches.      Objective:  Vitals:   02/14/21 1357  BP: 138/84  Pulse: 71  Temp: 98.4 F (36.9 C)  SpO2: 97%   BP Readings from Last 3 Encounters:  02/14/21 138/84  01/03/21 124/60  11/10/20 132/88   Wt Readings from Last 3 Encounters:  02/14/21 (!) 332 lb (150.6 kg)  01/03/21 (!) 322 lb (146.1 kg)  11/10/20 (!) 318 lb (144.2 kg)   Body mass index is 56.99 kg/m.   Physical Exam    Constitutional: Appears well-developed and well-nourished. No distress.  Head: Normocephalic and atraumatic.  Neck: Neck supple. No tracheal deviation present. No thyromegaly present.  No cervical lymphadenopathy Cardiovascular: Normal rate, regular rhythm and normal heart sounds.  No murmur heard. No carotid bruit .  Chronic bilateral lower extremity edema Pulmonary/Chest: Effort normal and breath sounds normal. No respiratory distress. No has no wheezes. No rales.  Skin: Skin is warm and dry. Not diaphoretic.  Psychiatric: Normal mood and affect. Behavior is normal.       Assessment & Plan:    See Problem List for Assessment and Plan of chronic medical problems.

## 2021-02-14 ENCOUNTER — Ambulatory Visit (INDEPENDENT_AMBULATORY_CARE_PROVIDER_SITE_OTHER): Payer: PRIVATE HEALTH INSURANCE | Admitting: Internal Medicine

## 2021-02-14 ENCOUNTER — Other Ambulatory Visit: Payer: Self-pay

## 2021-02-14 VITALS — BP 138/84 | HR 71 | Temp 98.4°F | Ht 64.0 in | Wt 332.0 lb

## 2021-02-14 DIAGNOSIS — R5383 Other fatigue: Secondary | ICD-10-CM

## 2021-02-14 DIAGNOSIS — R7303 Prediabetes: Secondary | ICD-10-CM | POA: Diagnosis not present

## 2021-02-14 DIAGNOSIS — I10 Essential (primary) hypertension: Secondary | ICD-10-CM

## 2021-02-14 DIAGNOSIS — H538 Other visual disturbances: Secondary | ICD-10-CM

## 2021-02-14 LAB — COMPREHENSIVE METABOLIC PANEL
ALT: 12 U/L (ref 0–35)
AST: 15 U/L (ref 0–37)
Albumin: 3.7 g/dL (ref 3.5–5.2)
Alkaline Phosphatase: 51 U/L (ref 39–117)
BUN: 23 mg/dL (ref 6–23)
CO2: 28 mEq/L (ref 19–32)
Calcium: 9.1 mg/dL (ref 8.4–10.5)
Chloride: 104 mEq/L (ref 96–112)
Creatinine, Ser: 0.92 mg/dL (ref 0.40–1.20)
GFR: 78.26 mL/min (ref >60.00)
Glucose, Bld: 87 mg/dL (ref 70–99)
Potassium: 3.9 mEq/L (ref 3.5–5.1)
Sodium: 138 mEq/L (ref 135–145)
Total Bilirubin: 0.3 mg/dL (ref 0.2–1.2)
Total Protein: 7.4 g/dL (ref 6.0–8.3)

## 2021-02-14 LAB — CBC WITH DIFFERENTIAL/PLATELET
Basophils Absolute: 0.1 10*3/uL (ref 0.0–0.1)
Basophils Relative: 1 % (ref 0.0–3.0)
Eosinophils Absolute: 0.1 10*3/uL (ref 0.0–0.7)
Eosinophils Relative: 1.4 % (ref 0.0–5.0)
HCT: 38.2 % (ref 36.0–46.0)
Hemoglobin: 12.5 g/dL (ref 12.0–15.0)
Lymphocytes Relative: 36.6 % (ref 12.0–46.0)
Lymphs Abs: 2 10*3/uL (ref 0.7–4.0)
MCHC: 32.7 g/dL (ref 30.0–36.0)
MCV: 94.9 fl (ref 78.0–100.0)
Monocytes Absolute: 0.7 10*3/uL (ref 0.1–1.0)
Monocytes Relative: 12.2 % — ABNORMAL HIGH (ref 3.0–12.0)
Neutro Abs: 2.6 10*3/uL (ref 1.4–7.7)
Neutrophils Relative %: 48.8 % (ref 43.0–77.0)
Platelets: 230 10*3/uL (ref 150.0–400.0)
RBC: 4.03 Mil/uL (ref 3.87–5.11)
RDW: 13.7 % (ref 11.5–15.5)
WBC: 5.4 10*3/uL (ref 4.0–10.5)

## 2021-02-14 LAB — TSH: TSH: 1.61 u[IU]/mL (ref 0.35–5.50)

## 2021-02-14 LAB — HEMOGLOBIN A1C: Hgb A1c MFr Bld: 5.3 % (ref 4.6–6.5)

## 2021-02-14 NOTE — Assessment & Plan Note (Signed)
Chronic Last A1c about 6 months ago was normal She has been eating a lot of sugar and has some fatigue, blurry vision, increased urination and thirst so elevated sugars is a concern Check A1c Stressed improving diet, exercise and weight loss

## 2021-02-14 NOTE — Assessment & Plan Note (Signed)
Acute She states fatigue Likely multifactorial Stressed weight loss, regular exercise Check CBC, CMP and TSH

## 2021-02-14 NOTE — Assessment & Plan Note (Signed)
Acute She did see an eye doctor-unsure if this was an optometrist or ophthalmologist and she plans on seeing another .  He did mention that some of her blurry vision was related to an eye problem she has-stressed seeing another ophthalmologist Will check A1c to rule out sugars as the cause

## 2021-02-14 NOTE — Assessment & Plan Note (Signed)
Chronic Blood pressure borderline high Stressed weight loss, healthy diet Will not change medication today-has an appointment later this month so we can recheck Continue Benicar-HCTZ 20-12.5 mg daily CMP

## 2021-02-14 NOTE — Patient Instructions (Addendum)
  Blood work was ordered.     Medications changes include :   none     

## 2021-02-16 DIAGNOSIS — M25571 Pain in right ankle and joints of right foot: Secondary | ICD-10-CM | POA: Insufficient documentation

## 2021-03-02 NOTE — Patient Instructions (Addendum)
     Medications changes include :   none    Please followup in 6 months     [place]: [place] [phone]   [facility] [place] [phone]

## 2021-03-02 NOTE — Progress Notes (Signed)
Subjective:    Patient ID: Marissa Joseph, female    DOB: Oct 23, 1980, 40 y.o.   MRN: [id number]  This visit occurred during the SARS-CoV-2 public health emergency.  Safety protocols were in place, including screening questions prior to the visit, additional usage of staff PPE, and extensive cleaning of exam room while observing appropriate contact time as indicated for disinfecting solutions.     HPI The patient is here for follow up of their chronic medical problems, including htn,  leg edema, depression, anxiety,   Still getting blurry vision when looking at the computer only.  She has not seen her eye doctor again.   Doing keto and trying to lose weight.   Medications and allergies reviewed with patient and updated if appropriate.  Patient Active Problem List   Diagnosis Date Noted   Blurry vision 02/14/2021   Fatigue 02/14/2021   Finger pain, right 11/10/2020   Nausea and vomiting 09/13/2020   Swollen joint 06/09/2020   Infected cyst of skin 06/09/2020   Bilateral leg edema 03/31/2020   Generalized abdominal pain 02/04/2020   Recurrent cellulitis of lower extremity 12/17/2019   Primary osteoarthritis of right knee 07/29/2019   Acute lateral meniscus tear of right knee 07/29/2019   Acute medial meniscus tear of right knee 07/29/2019   Morbid obesity with BMI of 50.0-59.9, adult ([place]) 07/29/2019   Muscular pain 11/13/2018   Scapular dyskinesis 10/07/2018   Acute back pain 09/25/2018   Adjustment disorder with mixed anxiety and depressed mood 05/10/2018   Amenorrhea 10/10/2017   Ventral hernia without obstruction or gangrene 07/13/2017   Hearing difficulty 11/24/2016   Prediabetes 09/06/2015   Essential hypertension, benign 09/06/2015   Smoking 12/22/2013   Osteoarthritis of left knee 08/29/2013   Pap smear of cervix with ASCUS, cannot exclude HGSIL 06/25/2013   Fibroid uterus 05/22/2013   Dysmenorrhea 05/22/2013   Monilial intertrigo 01/06/2013   ALLERGIC  RHINITIS 04/10/2008   HEEL PAIN, LEFT 11/22/2007   Chronic venous insufficiency of lower extremity 08/06/2007   Obstructive sleep apnea 03/25/2007   Eczema 02/21/2007   Asthma 01/21/2007    Current Outpatient Medications on File Prior to Visit  Medication Sig Dispense Refill   albuterol (VENTOLIN HFA) 108 (90 Base) MCG/ACT inhaler Inhale 1-2 puffs into the lungs every 6 (six) hours as needed for wheezing. 18 g 6   bisacodyl (DULCOLAX) 5 MG EC tablet Take 1 tablet (5 mg total) by mouth daily as needed for moderate constipation. 30 tablet 0   budesonide-formoterol (SYMBICORT) 80-4.5 MCG/ACT inhaler Inhale 2 puffs into the lungs 2 (two) times daily. 1 Inhaler 3   celecoxib (CELEBREX) 200 MG capsule Take 200 mg by mouth 2 (two) times daily.     diclofenac Sodium (VOLTAREN) 1 % GEL Apply 4 g topically 4 (four) times daily as needed. 150 g 0   furosemide (LASIX) 20 MG tablet Take 1 tablet (20 mg total) by mouth daily as needed. 90 tablet 1   gabapentin (NEURONTIN) 100 MG capsule Take 2 capsules (200 mg total) by mouth at bedtime. 60 capsule 3   montelukast (SINGULAIR) 10 MG tablet Take 1 tablet (10 mg total) by mouth at bedtime. 90 tablet 1   olmesartan-hydrochlorothiazide (BENICAR HCT) 20-12.5 MG tablet Take 1 tablet by mouth daily. 90 tablet 1   ondansetron (ZOFRAN ODT) 8 MG disintegrating tablet Take 1 tablet (8 mg total) by mouth every 8 (eight) hours as needed for nausea or vomiting. 20 tablet 0  polyethylene glycol powder (GLYCOLAX/MIRALAX) 17 GM/SCOOP powder Take 17 g by mouth 2 (two) times daily as needed for severe constipation. 116 g 1   No current facility-administered medications on file prior to visit.    Past Medical History:  Diagnosis Date   Anxiety    no official dx   Asthma    Eczema    Fibroids    GONORRHEA 03/25/2008   Qualifier: Diagnosis of  By: Isla Pence     Heart murmur    Heart murmur    Hypertension    MRSA (methicillin resistant staph aureus) culture  positive 05/2012   cellulits/ left leg   Obesity    Sleep apnea     Past Surgical History:  Procedure Laterality Date   NO PAST SURGERIES     TEE WITHOUT CARDIOVERSION N/A 12/22/2019   Procedure: TRANSESOPHAGEAL ECHOCARDIOGRAM (TEE);  Surgeon: Donato Heinz, MD;  Location: [facility] ENDOSCOPY;  Service: Cardiovascular;  Laterality: N/A;    Social History   Socioeconomic History   Marital status: Single    Spouse name: Not on file   Number of children: Not on file   Years of education: Not on file   Highest education level: Not on file  Occupational History   Not on file  Tobacco Use   Smoking status: Every Day    Packs/day: 0.20    Years: 5.00    Pack years: 1.00    Types: Cigarettes   Smokeless tobacco: Never  Vaping Use   Vaping Use: Every day  Substance and Sexual Activity   Alcohol use: Yes    Alcohol/week: 0.0 standard drinks    Comment: occ   Drug use: Yes    Types: Marijuana    Comment: pot-about 1 joint BID   Sexual activity: Yes    Birth control/protection: Condom  Other Topics Concern   Not on file  Social History Narrative   Not on file   Social Determinants of Health   Financial Resource Strain: Not on file  Food Insecurity: Not on file  Transportation Needs: Not on file  Physical Activity: Not on file  Stress: Not on file  Social Connections: Not on file    Family History  Problem Relation Age of Onset   Heart disease Maternal Grandmother    Diabetes Maternal Grandmother    Diabetes Father    Diabetes Mother    Cancer Maternal Uncle        prostate   Cancer Paternal Aunt        lung   Stroke Maternal Grandfather     Review of Systems  Constitutional:  Negative for fever.  Eyes:  Positive for visual disturbance (blurry visiton with looking at computer).  Respiratory:  Negative for cough, shortness of breath and wheezing.   Cardiovascular:  Positive for leg swelling. Negative for chest pain and palpitations.  Neurological:   Positive for dizziness (occ with bending over). Negative for light-headedness and headaches.      Objective:   Vitals:   03/03/21 0857  BP: 122/80  Pulse: 80  Temp: 98.1 F (36.7 C)  SpO2: 98%   BP Readings from Last 3 Encounters:  03/03/21 122/80  02/14/21 138/84  01/03/21 124/60   Wt Readings from Last 3 Encounters:  03/03/21 (!) 338 lb (153.3 kg)  02/14/21 (!) 332 lb (150.6 kg)  01/03/21 (!) 322 lb (146.1 kg)   Body mass index is 58.02 kg/m.   Physical Exam    Constitutional: Appears well-developed and  well-nourished. No distress.  HENT:  Head: Normocephalic and atraumatic.  Neck: Neck supple. No tracheal deviation present. No thyromegaly present.  No cervical lymphadenopathy Cardiovascular: Normal rate, regular rhythm and normal heart sounds.   No murmur heard. No carotid bruit .  Mild b/l LE edema Pulmonary/Chest: Effort normal and breath sounds normal. No respiratory distress. No has no wheezes. No rales.  Skin: Skin is warm and dry. Not diaphoretic.  Psychiatric: Normal mood and affect. Behavior is normal.      Assessment & Plan:    See Problem List for Assessment and Plan of chronic medical problems.

## 2021-03-03 ENCOUNTER — Other Ambulatory Visit: Payer: Self-pay

## 2021-03-03 ENCOUNTER — Encounter: Payer: Self-pay | Admitting: Internal Medicine

## 2021-03-03 ENCOUNTER — Ambulatory Visit (INDEPENDENT_AMBULATORY_CARE_PROVIDER_SITE_OTHER): Payer: PRIVATE HEALTH INSURANCE | Admitting: Internal Medicine

## 2021-03-03 VITALS — BP 122/80 | HR 80 | Temp 98.1°F | Ht 64.0 in | Wt 338.0 lb

## 2021-03-03 DIAGNOSIS — R600 Localized edema: Secondary | ICD-10-CM

## 2021-03-03 DIAGNOSIS — F4323 Adjustment disorder with mixed anxiety and depressed mood: Secondary | ICD-10-CM | POA: Diagnosis not present

## 2021-03-03 DIAGNOSIS — I10 Essential (primary) hypertension: Secondary | ICD-10-CM

## 2021-03-03 DIAGNOSIS — H538 Other visual disturbances: Secondary | ICD-10-CM

## 2021-03-03 DIAGNOSIS — R7303 Prediabetes: Secondary | ICD-10-CM | POA: Diagnosis not present

## 2021-03-03 NOTE — Assessment & Plan Note (Signed)
Subacute Only has blurry vision when she looks at her computer at work for long periods of time Will see her eye doctor again

## 2021-03-03 NOTE — Assessment & Plan Note (Signed)
Chronic BP well controlled Continue benicar-hctz 20-12.5 daily

## 2021-03-03 NOTE — Assessment & Plan Note (Signed)
Chronic Lab Results  Component Value Date   HGBA1C 5.3 02/14/2021   Well controlled Continue weight loss efforts

## 2021-03-03 NOTE — Assessment & Plan Note (Signed)
Chronic Controlled Now that it is cooler she will start wearing her compression socks daily Continue lasix 20 mg daily

## 2021-03-03 NOTE — Assessment & Plan Note (Signed)
No longer taking lexapro and buproprion and doing well Will monitor off medication

## 2021-03-04 ENCOUNTER — Ambulatory Visit: Payer: PRIVATE HEALTH INSURANCE | Admitting: Internal Medicine

## 2021-03-24 ENCOUNTER — Ambulatory Visit: Payer: PRIVATE HEALTH INSURANCE | Admitting: Nurse Practitioner

## 2021-03-24 ENCOUNTER — Encounter: Payer: Self-pay | Admitting: Nurse Practitioner

## 2021-03-24 ENCOUNTER — Ambulatory Visit (INDEPENDENT_AMBULATORY_CARE_PROVIDER_SITE_OTHER): Payer: PRIVATE HEALTH INSURANCE | Admitting: Nurse Practitioner

## 2021-03-24 ENCOUNTER — Other Ambulatory Visit: Payer: Self-pay

## 2021-03-24 ENCOUNTER — Encounter: Payer: Self-pay | Admitting: Internal Medicine

## 2021-03-24 VITALS — BP 148/94 | HR 90 | Temp 98.4°F | Resp 16 | Ht 64.0 in | Wt 328.5 lb

## 2021-03-24 DIAGNOSIS — R600 Localized edema: Secondary | ICD-10-CM | POA: Insufficient documentation

## 2021-03-24 NOTE — Assessment & Plan Note (Signed)
Patient has recurrent bilateral lower extremity swelling.  Patient states left greater than right on a daily basis.  Patient does wear compression garments to help.  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  Patient states she also had a DVT in the past that had to be treated with anticoagulation.  Given exam and symptoms not overly convinced that it is cellulitis.  With palpable cord concern for DVT or superficial venous thrombus versus cellulitis.  We will draw CBC with differential and got stat ultrasound to performed in a.m.  Patient given signs and symptoms as when the seek emergent care overnight.  Patient acknowledged and agreed with care plan.  Pending lab results and radial logical imaging study results.

## 2021-03-24 NOTE — Patient Instructions (Addendum)
[facility] arrive by 7:45am for an 8:00 ultrasound at the Vascular lab.  I will be in touch with results in the morning.

## 2021-03-24 NOTE — Progress Notes (Signed)
Acute Office Visit  Subjective:    Patient ID: Marissa Joseph, female    DOB: 1980-07-13, 40 y.o.   MRN: [id number]  Chief Complaint  Patient presents with   Leg Pain    X 2 days, sensitivity/pain on lower left leg, itching present. Noticed this after compression stocking slipped down her leg, swelling present.     Leg Pain   Patient is in today for Skin check  Monday she started wearing her compression sleeve. 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 States that it was bunched up. States she felt it that day Worse today. Described a throbbing pain.  Warm to touch. Unsure if skin is red.  Hx of DVT. Does somewhat feel similar to previous episodes.   States that the left more swollen than normal. Does have a rash that she uses triamcinolone and another cream on to keep the skin intact.  49cm right  52cm Left  Past Medical History:  Diagnosis Date   Anxiety    no official dx   Asthma    Eczema    Fibroids    GONORRHEA 03/25/2008   Qualifier: Diagnosis of  By: Isla Pence     Heart murmur    Heart murmur    Hypertension    MRSA (methicillin resistant staph aureus) culture positive 05/2012   cellulits/ left leg   Obesity    Sleep apnea     Past Surgical History:  Procedure Laterality Date   NO PAST SURGERIES     TEE WITHOUT CARDIOVERSION N/A 12/22/2019   Procedure: TRANSESOPHAGEAL ECHOCARDIOGRAM (TEE);  Surgeon: Donato Heinz, MD;  Location: [facility] ENDOSCOPY;  Service: Cardiovascular;  Laterality: N/A;    Family History  Problem Relation Age of Onset   Heart disease Maternal Grandmother    Diabetes Maternal Grandmother    Diabetes Father    Diabetes Mother    Cancer Maternal Uncle        prostate   Cancer Paternal Aunt        lung   Stroke Maternal Grandfather     Social History   Socioeconomic History   Marital status: Single    Spouse name: Not on file   Number of children: Not on file   Years of education: Not on  file   Highest education level: Not on file  Occupational History   Not on file  Tobacco Use   Smoking status: Every Day    Packs/day: 0.20    Years: 5.00    Pack years: 1.00    Types: Cigarettes   Smokeless tobacco: Never  Vaping Use   Vaping Use: Every day  Substance and Sexual Activity   Alcohol use: Yes    Alcohol/week: 0.0 standard drinks    Comment: occ   Drug use: Yes    Types: Marijuana    Comment: pot-about 1 joint BID   Sexual activity: Yes    Birth control/protection: Condom  Other Topics Concern   Not on file  Social History Narrative   Not on file   Social Determinants of Health   Financial Resource Strain: Not on file  Food Insecurity: Not on file  Transportation Needs: Not on file  Physical Activity: Not on file  Stress: Not on file  Social Connections: Not on file  Intimate Partner Violence: Not on file    Outpatient Medications Prior to Visit  Medication Sig Dispense Refill   albuterol (VENTOLIN HFA) 108 (90 Base) MCG/ACT  inhaler Inhale 1-2 puffs into the lungs every 6 (six) hours as needed for wheezing. 18 g 6   bisacodyl (DULCOLAX) 5 MG EC tablet Take 1 tablet (5 mg total) by mouth daily as needed for moderate constipation. 30 tablet 0   budesonide-formoterol (SYMBICORT) 80-4.5 MCG/ACT inhaler Inhale 2 puffs into the lungs 2 (two) times daily. 1 Inhaler 3   celecoxib (CELEBREX) 200 MG capsule Take 200 mg by mouth 2 (two) times daily.     diclofenac Sodium (VOLTAREN) 1 % GEL Apply 4 g topically 4 (four) times daily as needed. 150 g 0   furosemide (LASIX) 20 MG tablet Take 1 tablet (20 mg total) by mouth daily as needed. 90 tablet 1   gabapentin (NEURONTIN) 100 MG capsule Take 2 capsules (200 mg total) by mouth at bedtime. 60 capsule 3   montelukast (SINGULAIR) 10 MG tablet Take 1 tablet (10 mg total) by mouth at bedtime. 90 tablet 1   olmesartan-hydrochlorothiazide (BENICAR HCT) 20-12.5 MG tablet Take 1 tablet by mouth daily. 90 tablet 1    ondansetron (ZOFRAN ODT) 8 MG disintegrating tablet Take 1 tablet (8 mg total) by mouth every 8 (eight) hours as needed for nausea or vomiting. 20 tablet 0   polyethylene glycol powder (GLYCOLAX/MIRALAX) 17 GM/SCOOP powder Take 17 g by mouth 2 (two) times daily as needed for severe constipation. 116 g 1   No facility-administered medications prior to visit.    Allergies  Allergen Reactions   Avocado Other (See Comments)    Throat itching and swelling   Shellfish Allergy Hives, Shortness Of Breath and Swelling    Review of Systems  Constitutional:  Negative for chills and fever.  Respiratory:  Negative for cough and shortness of breath.   Cardiovascular:  Positive for leg swelling. Negative for chest pain.  Gastrointestinal:  Negative for diarrhea, nausea and vomiting.  Skin:  Positive for color change.      Objective:    Physical Exam Vitals and nursing note reviewed.  Constitutional:      Appearance: She is obese.  Cardiovascular:     Rate and Rhythm: Normal rate and regular rhythm.  Pulmonary:     Effort: Pulmonary effort is normal.     Breath sounds: Normal breath sounds.  Abdominal:     General: Bowel sounds are normal.  Musculoskeletal:        General: No signs of injury.     Right lower leg: Edema present.     Left lower leg: Edema present.  Skin:    General: Skin is warm.     Findings: Erythema present.          Comments: Has darkening discoloration to bilateral lower extremities left greater than right. Palpable cord feeling along with warmth tl left lateral lower leg. 2+ pitting edema to LLE and 1+ pitting edema to RLE.  Neurological:     General: No focal deficit present.     Mental Status: She is alert.  Psychiatric:        Mood and Affect: Mood normal.        Behavior: Behavior normal.        Thought Content: Thought content normal.        Judgment: Judgment normal.    There were no vitals taken for this visit. Wt Readings from Last 3 Encounters:   03/03/21 (!) 338 lb (153.3 kg)  02/14/21 (!) 332 lb (150.6 kg)  01/03/21 (!) 322 lb (146.1 kg)    Health Maintenance  Due  Topic Date Due   COVID-19 Vaccine (1) Never done   Pneumococcal Vaccine 50-20 Years old (2 - PCV) 06/04/2013    There are no preventive care reminders to display for this patient.   Lab Results  Component Value Date   TSH 1.61 02/14/2021   Lab Results  Component Value Date   WBC 5.4 02/14/2021   HGB 12.5 02/14/2021   HCT 38.2 02/14/2021   MCV 94.9 02/14/2021   PLT 230.0 02/14/2021   Lab Results  Component Value Date   NA 138 02/14/2021   K 3.9 02/14/2021   CO2 28 02/14/2021   GLUCOSE 87 02/14/2021   BUN 23 02/14/2021   CREATININE 0.92 02/14/2021   BILITOT 0.3 02/14/2021   ALKPHOS 51 02/14/2021   AST 15 02/14/2021   ALT 12 02/14/2021   PROT 7.4 02/14/2021   ALBUMIN 3.7 02/14/2021   CALCIUM 9.1 02/14/2021   ANIONGAP 10 12/23/2019   GFR 78.26 02/14/2021   Lab Results  Component Value Date   CHOL 104 05/14/2019   Lab Results  Component Value Date   HDL 36.00 (L) 05/14/2019   Lab Results  Component Value Date   LDLCALC 58 05/14/2019   Lab Results  Component Value Date   TRIG 53.0 05/14/2019   Lab Results  Component Value Date   CHOLHDL 3 05/14/2019   Lab Results  Component Value Date   HGBA1C 5.3 02/14/2021       Assessment & Plan:   Problem List Items Addressed This Visit       Other   Lower extremity edema - Primary    Patient has recurrent bilateral lower extremity swelling.  Patient states left greater than right on a daily basis.  Patient does wear compression garments to help.  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  Patient states she also had a DVT in the past that had to be treated with anticoagulation.  Given exam and symptoms not overly convinced that it is cellulitis.  With palpable cord concern for DVT or superficial venous thrombus  versus cellulitis.  We will draw CBC with differential and got stat ultrasound to performed in a.m.  Patient given signs and symptoms as when the seek emergent care overnight.  Patient acknowledged and agreed with care plan.  Pending lab results and radial logical imaging study results.      Relevant Orders   CBC with Differential/Platelet   Basic metabolic panel   VAS [place] LOWER EXTREMITY VENOUS (DVT)     No orders of the defined types were placed in this encounter.  This visit occurred during the SARS-CoV-2 public health emergency.  Safety protocols were in place, including screening questions prior to the visit, additional usage of staff PPE, and extensive cleaning of exam room while observing appropriate contact time as indicated for disinfecting solutions.   Romilda Garret, NP

## 2021-03-25 ENCOUNTER — Other Ambulatory Visit: Payer: Self-pay | Admitting: Nurse Practitioner

## 2021-03-25 ENCOUNTER — Ambulatory Visit (HOSPITAL_COMMUNITY)
Admission: RE | Admit: 2021-03-25 | Discharge: 2021-03-25 | Disposition: A | Discharge status: Home / Self Care | Payer: PRIVATE HEALTH INSURANCE | Source: Ambulatory Visit | Attending: Nurse Practitioner | Admitting: Nurse Practitioner

## 2021-03-25 DIAGNOSIS — L03116 Cellulitis of left lower limb: Secondary | ICD-10-CM

## 2021-03-25 DIAGNOSIS — R600 Localized edema: Secondary | ICD-10-CM | POA: Diagnosis present

## 2021-03-25 LAB — CBC WITH DIFFERENTIAL/PLATELET
Basophils Absolute: 0.1 10*3/uL (ref 0.0–0.1)
Basophils Relative: 0.9 % (ref 0.0–3.0)
Eosinophils Absolute: 0.1 10*3/uL (ref 0.0–0.7)
Eosinophils Relative: 2.2 % (ref 0.0–5.0)
HCT: 40.5 % (ref 36.0–46.0)
Hemoglobin: 13.3 g/dL (ref 12.0–15.0)
Lymphocytes Relative: 32.6 % (ref 12.0–46.0)
Lymphs Abs: 1.9 10*3/uL (ref 0.7–4.0)
MCHC: 32.9 g/dL (ref 30.0–36.0)
MCV: 94.3 fl (ref 78.0–100.0)
Monocytes Absolute: 0.6 10*3/uL (ref 0.1–1.0)
Monocytes Relative: 9.6 % (ref 3.0–12.0)
Neutro Abs: 3.2 10*3/uL (ref 1.4–7.7)
Neutrophils Relative %: 54.7 % (ref 43.0–77.0)
Platelets: 221 10*3/uL (ref 150.0–400.0)
RBC: 4.3 Mil/uL (ref 3.87–5.11)
RDW: 13.9 % (ref 11.5–15.5)
WBC: 5.8 10*3/uL (ref 4.0–10.5)

## 2021-03-25 LAB — BASIC METABOLIC PANEL
BUN: 20 mg/dL (ref 6–23)
CO2: 27 mEq/L (ref 19–32)
Calcium: 9.2 mg/dL (ref 8.4–10.5)
Chloride: 104 mEq/L (ref 96–112)
Creatinine, Ser: 0.85 mg/dL (ref 0.40–1.20)
GFR: 85.99 mL/min (ref >60.00)
Glucose, Bld: 90 mg/dL (ref 70–99)
Potassium: 3.8 mEq/L (ref 3.5–5.1)
Sodium: 137 mEq/L (ref 135–145)

## 2021-03-25 MED ORDER — SULFAMETHOXAZOLE-TRIMETHOPRIM 800-160 MG PO TABS: 1.0000 | ORAL_TABLET | Freq: Two times a day (BID) | ORAL | 0 refills | Status: AC

## 2021-03-25 NOTE — Progress Notes (Signed)
Lower extremity venous has been completed.   Preliminary results in CV Proc.   Marissa Joseph 03/25/2021 8:19 AM

## 2021-03-25 NOTE — Progress Notes (Signed)
Radiology called and [place] was negative for DVT or SVT. Will treat for cellulitis. Patient was called and made aware

## 2021-05-18 ENCOUNTER — Encounter: Payer: Self-pay | Admitting: Internal Medicine

## 2021-05-20 ENCOUNTER — Telehealth: Payer: Self-pay | Admitting: Internal Medicine

## 2021-05-20 NOTE — Telephone Encounter (Signed)
1.Medication Requested: albuterol (VENTOLIN HFA) 108 (90 Base) MCG/ACT inhaler  2. Pharmacy (Name, Street, [place]):[facility], [address]  3. On Med List: yes   4. Last Visit with PCP: 03-04-21  5. Next visit date with PCP: n/a   Agent: Please be advised that RX refills may take up to 3 business days. We ask that you follow-up with your pharmacy.

## 2021-05-21 ENCOUNTER — Encounter: Payer: Self-pay | Admitting: Internal Medicine

## 2021-05-23 ENCOUNTER — Other Ambulatory Visit: Payer: Self-pay

## 2021-05-23 MED ORDER — ALBUTEROL SULFATE HFA 108 (90 BASE) MCG/ACT IN AERS: 1.0000 | INHALATION_SPRAY | Freq: Four times a day (QID) | RESPIRATORY_TRACT | 6 refills | Status: DC | PRN

## 2021-05-23 NOTE — Telephone Encounter (Signed)
Sent in today 

## 2021-06-08 ENCOUNTER — Encounter: Payer: Self-pay | Admitting: Internal Medicine

## 2021-06-09 ENCOUNTER — Other Ambulatory Visit: Payer: Self-pay

## 2021-06-09 ENCOUNTER — Ambulatory Visit (INDEPENDENT_AMBULATORY_CARE_PROVIDER_SITE_OTHER): Payer: PRIVATE HEALTH INSURANCE | Admitting: Nurse Practitioner

## 2021-06-09 ENCOUNTER — Encounter: Payer: Self-pay | Admitting: Nurse Practitioner

## 2021-06-09 VITALS — BP 136/82 | HR 85 | Temp 97.9°F | Ht 64.0 in | Wt 332.6 lb

## 2021-06-09 DIAGNOSIS — R002 Palpitations: Secondary | ICD-10-CM | POA: Diagnosis not present

## 2021-06-09 DIAGNOSIS — Z6841 Body Mass Index (BMI) 40.0 and over, adult: Secondary | ICD-10-CM

## 2021-06-09 LAB — CBC WITH DIFFERENTIAL/PLATELET
Basophils Absolute: 0.1 10*3/uL (ref 0.0–0.1)
Basophils Relative: 1.1 % (ref 0.0–3.0)
Eosinophils Absolute: 0.1 10*3/uL (ref 0.0–0.7)
Eosinophils Relative: 1.6 % (ref 0.0–5.0)
HCT: 39.9 % (ref 36.0–46.0)
Hemoglobin: 13.1 g/dL (ref 12.0–15.0)
Lymphocytes Relative: 41.3 % (ref 12.0–46.0)
Lymphs Abs: 2.3 10*3/uL (ref 0.7–4.0)
MCHC: 32.9 g/dL (ref 30.0–36.0)
MCV: 94.1 fl (ref 78.0–100.0)
Monocytes Absolute: 0.6 10*3/uL (ref 0.1–1.0)
Monocytes Relative: 10 % (ref 3.0–12.0)
Neutro Abs: 2.6 10*3/uL (ref 1.4–7.7)
Neutrophils Relative %: 46 % (ref 43.0–77.0)
Platelets: 217 10*3/uL (ref 150.0–400.0)
RBC: 4.24 Mil/uL (ref 3.87–5.11)
RDW: 13.9 % (ref 11.5–15.5)
WBC: 5.7 10*3/uL (ref 4.0–10.5)

## 2021-06-09 LAB — COMPREHENSIVE METABOLIC PANEL
ALT: 13 U/L (ref 0–35)
AST: 17 U/L (ref 0–37)
Albumin: 3.7 g/dL (ref 3.5–5.2)
Alkaline Phosphatase: 58 U/L (ref 39–117)
BUN: 20 mg/dL (ref 6–23)
CO2: 29 mEq/L (ref 19–32)
Calcium: 9.3 mg/dL (ref 8.4–10.5)
Chloride: 104 mEq/L (ref 96–112)
Creatinine, Ser: 0.9 mg/dL (ref 0.40–1.20)
GFR: 80.17 mL/min (ref >60.00)
Glucose, Bld: 79 mg/dL (ref 70–99)
Potassium: 4 mEq/L (ref 3.5–5.1)
Sodium: 139 mEq/L (ref 135–145)
Total Bilirubin: 0.3 mg/dL (ref 0.2–1.2)
Total Protein: 7.5 g/dL (ref 6.0–8.3)

## 2021-06-09 LAB — T4, FREE: Free T4: 0.95 ng/dL (ref 0.60–1.60)

## 2021-06-09 LAB — T3, FREE: T3, Free: 3.5 pg/mL (ref 2.3–4.2)

## 2021-06-09 LAB — TSH: TSH: 1.28 u[IU]/mL (ref 0.35–5.50)

## 2021-06-09 NOTE — Progress Notes (Signed)
Subjective:  Patient ID: Marissa Joseph, female    DOB: 26-Mar-1981  Age: 41 y.o. MRN: [id number]  CC:  Chief Complaint  Patient presents with   Back Pain    Feeling a lot of tension   Chest Pain      HPI  This patient arrives today for the above.  For the last month patient's been experiencing chest tightness that wraps around to her back and cardiac palpitations when she lays down.  She tells me that the pain and the palpitations last for about 10 minutes but will resolve with position changes such as sitting up.  She does report some mild shortness of breath during the episodes and does report that she been feeling bit anxious.  She does not feel the episodes when she is up and moving around.  She tells me she does have a history of a heart murmur.  No significant known cardiac history in her first-degree relatives but does tell me that her grandmother had a "enlarged heart".  She also wanted to discuss her diet as she is trying to lose weight.  Has been using keto diet but has not noted much weight loss.  She tells me she is on Lasix as needed for swelling in her legs and tells me she takes it every day.  She is wondering if this can be contributing to her palpitations.  Past Medical History:  Diagnosis Date   Anxiety    no official dx   Asthma    Eczema    Fibroids    GONORRHEA 03/25/2008   Qualifier: Diagnosis of  By: Isla Pence     Heart murmur    Heart murmur    Hypertension    MRSA (methicillin resistant staph aureus) culture positive 05/2012   cellulits/ left leg   Obesity    Sleep apnea       Family History  Problem Relation Age of Onset   Heart disease Maternal Grandmother    Diabetes Maternal Grandmother    Diabetes Father    Diabetes Mother    Cancer Maternal Uncle        prostate   Cancer Paternal Aunt        lung   Stroke Maternal Grandfather     Social History   Social History Narrative   Not on file   Social History    Tobacco Use   Smoking status: Every Day    Packs/day: 0.20    Years: 5.00    Pack years: 1.00    Types: Cigarettes   Smokeless tobacco: Never  Substance Use Topics   Alcohol use: Yes    Alcohol/week: 0.0 standard drinks    Comment: occ     Current Meds  Medication Sig   albuterol (VENTOLIN HFA) 108 (90 Base) MCG/ACT inhaler Inhale 1-2 puffs into the lungs every 6 (six) hours as needed for wheezing.   bisacodyl (DULCOLAX) 5 MG EC tablet Take 1 tablet (5 mg total) by mouth daily as needed for moderate constipation.   budesonide-formoterol (SYMBICORT) 80-4.5 MCG/ACT inhaler Inhale 2 puffs into the lungs 2 (two) times daily.   celecoxib (CELEBREX) 200 MG capsule Take 200 mg by mouth 2 (two) times daily.   diclofenac Sodium (VOLTAREN) 1 % GEL Apply 4 g topically 4 (four) times daily as needed.   furosemide (LASIX) 20 MG tablet Take 1 tablet (20 mg total) by mouth daily as needed.   gabapentin (NEURONTIN) 100 MG capsule Take 2  capsules (200 mg total) by mouth at bedtime.   montelukast (SINGULAIR) 10 MG tablet Take 1 tablet (10 mg total) by mouth at bedtime.   olmesartan-hydrochlorothiazide (BENICAR HCT) 20-12.5 MG tablet Take 1 tablet by mouth daily.   ondansetron (ZOFRAN ODT) 8 MG disintegrating tablet Take 1 tablet (8 mg total) by mouth every 8 (eight) hours as needed for nausea or vomiting.   polyethylene glycol powder (GLYCOLAX/MIRALAX) 17 GM/SCOOP powder Take 17 g by mouth 2 (two) times daily as needed for severe constipation.    ROS:  Review of Systems  Respiratory:  Positive for shortness of breath.   Cardiovascular:  Positive for chest pain and palpitations.  Musculoskeletal:  Positive for back pain.  Psychiatric/Behavioral:  The patient is nervous/anxious.     Objective:   Today's Vitals: BP 136/82 (BP Location: Left Arm, Patient Position: Sitting, Cuff Size: Large)    Pulse 85    Temp 97.9 F (36.6 C) (Oral)    Ht 5\' 4"  (1.626 m)    Wt (!) 332 lb 9.6 oz (150.9 kg)     SpO2 97%    BMI 57.09 kg/m  Vitals with BMI 06/09/2021 03/24/2021 03/03/2021  Height 5\' 4"  5\' 4"  5\' 4"   Weight 332 lbs 10 oz 328 lbs 8 oz 338 lbs  BMI 57.06 10.17 51.02  Systolic 585 277 824  Diastolic 82 94 80  Pulse 85 90 80     Physical Exam Vitals reviewed.  Constitutional:      General: She is not in acute distress.    Appearance: Normal appearance.  HENT:     Head: Normocephalic and atraumatic.  Neck:     Vascular: No carotid bruit.  Cardiovascular:     Rate and Rhythm: Normal rate and regular rhythm.     Pulses: Normal pulses.     Heart sounds: Normal heart sounds.  Pulmonary:     Effort: Pulmonary effort is normal.     Breath sounds: Normal breath sounds.  Skin:    General: Skin is warm and dry.  Neurological:     General: No focal deficit present.     Mental Status: She is alert and oriented to person, place, and time.  Psychiatric:        Mood and Affect: Mood normal.        Behavior: Behavior normal.        Judgment: Judgment normal.    EKG: NSR     Assessment and Plan   1. Palpitations   2. Class 3 severe obesity with body mass index (BMI) of 50.0 to 59.9 in adult, unspecified obesity type, unspecified whether serious comorbidity present ([place])      Plan: 1.  No significant abnormalities noted on physical exam, EKG was normal as well.  No murmur noted on physical exam despite her telling me she has a history of heart murmur.  We will refer her to cardiology for further evaluation.  We will check blood work including thyroid panel, CMP, and CBC for further evaluation. 2.  Recommend she discuss this further with Dr. Quay Burow next time she sees her, but she could consider trying intermittent fasting if Dr. Quay Burow feels that this would be okay and safe for this patient.   Tests ordered Orders Placed This Encounter  Procedures   TSH   T4, free   T3, free   Comprehensive metabolic panel   CBC with Differential/Platelet   Ambulatory referral to Cardiology    EKG 12-Lead  No orders of the defined types were placed in this encounter.   Patient to follow-up in 1 to 2 months with Dr. Quay Burow to touch base regarding her symptoms as well as discuss weight loss options.  Ailene Ards, NP

## 2021-07-12 ENCOUNTER — Encounter: Payer: Self-pay | Admitting: Internal Medicine

## 2021-07-12 ENCOUNTER — Encounter (HOSPITAL_BASED_OUTPATIENT_CLINIC_OR_DEPARTMENT_OTHER): Payer: Self-pay | Admitting: Cardiology

## 2021-07-12 ENCOUNTER — Other Ambulatory Visit: Payer: Self-pay

## 2021-07-12 ENCOUNTER — Ambulatory Visit (INDEPENDENT_AMBULATORY_CARE_PROVIDER_SITE_OTHER): Payer: PRIVATE HEALTH INSURANCE | Admitting: Cardiology

## 2021-07-12 VITALS — BP 142/90 | HR 70 | Ht 64.0 in | Wt 338.5 lb

## 2021-07-12 DIAGNOSIS — Z7189 Other specified counseling: Secondary | ICD-10-CM

## 2021-07-12 DIAGNOSIS — Z6841 Body Mass Index (BMI) 40.0 and over, adult: Secondary | ICD-10-CM

## 2021-07-12 DIAGNOSIS — I872 Venous insufficiency (chronic) (peripheral): Secondary | ICD-10-CM

## 2021-07-12 DIAGNOSIS — I10 Essential (primary) hypertension: Secondary | ICD-10-CM | POA: Diagnosis not present

## 2021-07-12 DIAGNOSIS — R002 Palpitations: Secondary | ICD-10-CM

## 2021-07-12 NOTE — Progress Notes (Signed)
Cardiology Office Note:    Date:  07/12/2021   ID:  Marissa Joseph, DOB [birth year], MRN [id number]  PCP:  Binnie Rail, MD  Cardiologist:  Buford Dresser, MD  Referring MD: Ailene Ards, NP   CC: new patient consultation for palpitations  History of Present Illness:    Marissa Joseph is a 41 y.o. female with a hx of chronic venous insufficiency, obesity, hypertension, OSA who is seen as a new consult at the request of Ailene Ards, NP for the evaluation and management of palpitations and chest pain.  Note from 06/09/21 from Jeralyn Ruths reviewed. Patient noted 1 month history of chest/back tightness and palpitations when she lays down, improves with sitting up. Also noted history of heart murmur. Notes that grandmother had an enlarged heart. Referred to cardiology for further evaluation.  Around December 2022, notice that when she would lay down to go to bed, she chest tightness, would roll to the left and feel her heart racing. Symptoms have gotten better with adjusting her bed. Also better with a heating pad. Works at [employer] at [employer] job, doesn't have pressure with activity but does feel that she loses her breath more quickly (noticed more in the winter). Has chronic cough.  Under a lot of stress, tearful in the office today. Had Covid again in December 2022, aggravated her asthma.   Working on weight loss. We discussed GLP1RA, she is very interested.   Denies shortness of breath at rest. No PND, orthopnea, or unexpected weight gain. No syncope.   We discussed monitor vs. Kardia mobile today. If insurance covers her monitor she would like that.  Past Medical History:  Diagnosis Date   Anxiety    no official dx   Asthma    Eczema    Fibroids    GONORRHEA 03/25/2008   Qualifier: Diagnosis of  By: Isla Pence     Heart murmur    Heart murmur    Hypertension    MRSA (methicillin resistant staph aureus) culture positive 05/2012   cellulits/ left leg    Obesity    Sleep apnea     Past Surgical History:  Procedure Laterality Date   NO PAST SURGERIES     TEE WITHOUT CARDIOVERSION N/A 12/22/2019   Procedure: TRANSESOPHAGEAL ECHOCARDIOGRAM (TEE);  Surgeon: Donato Heinz, MD;  Location: [facility] ENDOSCOPY;  Service: Cardiovascular;  Laterality: N/A;    Current Medications: Current Outpatient Medications on File Prior to Visit  Medication Sig   albuterol (VENTOLIN HFA) 108 (90 Base) MCG/ACT inhaler Inhale 1-2 puffs into the lungs every 6 (six) hours as needed for wheezing.   bisacodyl (DULCOLAX) 5 MG EC tablet Take 1 tablet (5 mg total) by mouth daily as needed for moderate constipation.   budesonide-formoterol (SYMBICORT) 80-4.5 MCG/ACT inhaler Inhale 2 puffs into the lungs 2 (two) times daily.   celecoxib (CELEBREX) 200 MG capsule Take 200 mg by mouth 2 (two) times daily.   diclofenac Sodium (VOLTAREN) 1 % GEL Apply 4 g topically 4 (four) times daily as needed.   furosemide (LASIX) 20 MG tablet Take 1 tablet (20 mg total) by mouth daily as needed.   gabapentin (NEURONTIN) 100 MG capsule Take 2 capsules (200 mg total) by mouth at bedtime.   montelukast (SINGULAIR) 10 MG tablet Take 1 tablet (10 mg total) by mouth at bedtime.   olmesartan-hydrochlorothiazide (BENICAR HCT) 20-12.5 MG tablet Take 1 tablet by mouth daily.   ondansetron (ZOFRAN ODT) 8 MG disintegrating  tablet Take 1 tablet (8 mg total) by mouth every 8 (eight) hours as needed for nausea or vomiting.   polyethylene glycol powder (GLYCOLAX/MIRALAX) 17 GM/SCOOP powder Take 17 g by mouth 2 (two) times daily as needed for severe constipation.   No current facility-administered medications on file prior to visit.     Allergies:   Avocado and Shellfish allergy   Social History   Tobacco Use   Smoking status: Every Day    Packs/day: 0.20    Years: 5.00    Pack years: 1.00    Types: Cigarettes   Smokeless tobacco: Never  Vaping Use   Vaping Use: Every day  Substance Use  Topics   Alcohol use: Yes    Alcohol/week: 0.0 standard drinks    Comment: occ   Drug use: Yes    Types: Marijuana    Comment: pot-about 1 joint BID    Family History: family history includes Cancer in her maternal uncle and paternal aunt; Diabetes in her father, maternal grandmother, and mother; Heart disease in her maternal grandmother; Stroke in her maternal grandfather.  ROS:   Please see the history of present illness.  Additional pertinent ROS: Constitutional: Negative for chills, fever, night sweats, unintentional weight loss  HENT: Negative for ear pain and hearing loss.   Eyes: Negative for loss of vision and eye pain.  Respiratory: Negative for cough, sputum, wheezing.   Cardiovascular: See HPI. Gastrointestinal: Negative for abdominal pain, melena, and hematochezia.  Genitourinary: Negative for dysuria and hematuria.  Musculoskeletal: Negative for falls and myalgias.  Skin: Negative for itching and rash.  Neurological: Negative for focal weakness, focal sensory changes and loss of consciousness.  Endo/Heme/Allergies: Does not bruise/bleed easily.     EKGs/Labs/Other Studies Reviewed:    The following studies were reviewed today: TEE 2020-01-02 1. Left ventricular ejection fraction, by estimation, is 60 to 65%. The left ventricle has normal function. The left ventricle has no regional wall motion abnormalities. There is mild left ventricular hypertrophy. 2. Right ventricular systolic function is mildly reduced. The right ventricular size is mildly enlarged. 3. No left atrial/left atrial appendage thrombus was detected. 4. Right atrial size was mildly dilated. 5. The mitral valve is normal in structure. Trivial mitral valve regurgitation. 6. The aortic valve is tricuspid. Aortic valve regurgitation is not visualized. No aortic stenosis is present. 7. No vegetation seen  Echo 12/18/19  1. Left ventricular ejection fraction, by estimation, is 65 to 70%. The  left ventricle  has normal function. The left ventricle has no regional  wall motion abnormalities. There is mild left ventricular hypertrophy.  Left ventricular diastolic parameters  were normal.   2. Right ventricular systolic function is normal. The right ventricular  size is normal. Tricuspid regurgitation signal is inadequate for assessing  PA pressure.   3. The mitral valve is normal in structure. No evidence of mitral valve  regurgitation.   4. The aortic valve was not well visualized. Aortic valve regurgitation  is not visualized. No aortic stenosis is present.   5. Appears to be a small echodensity on septal leaflet of tricuspid  valve, seen on RV inflow view (images 16 and 91). Not seen on other views.  No significant tricuspid regurgitation. Recommend TEE for further  evaluation.   EKG:  EKG is personally reviewed.   07/12/21: sinus rhythm with 1st degree AV block, 70 bpm  Recent Labs: 06/09/2021: ALT 13; BUN 20; Creatinine, Ser 0.90; Hemoglobin 13.1; Platelets 217.0; Potassium 4.0; Sodium 139; TSH  1.28  Recent Lipid Panel    Component Value Date/Time   CHOL 104 05/14/2019 1640   TRIG 53.0 05/14/2019 1640   HDL 36.00 (L) 05/14/2019 1640   CHOLHDL 3 05/14/2019 1640   VLDL 10.6 05/14/2019 1640   LDLCALC 58 05/14/2019 1640    Physical Exam:    VS:  BP (!) 142/90 (BP Location: Right Arm, Patient Position: Sitting, Cuff Size: Large)    Pulse 70    Ht 5\' 4"  (1.626 m)    Wt (!) 338 lb 8 oz (153.5 kg)    BMI 58.10 kg/m     Wt Readings from Last 3 Encounters:  07/13/21 (!) 336 lb (152.4 kg)  07/12/21 (!) 338 lb 8 oz (153.5 kg)  06/09/21 (!) 332 lb 9.6 oz (150.9 kg)    GEN: Well nourished, well developed in no acute distress HEENT: Normal, moist mucous membranes NECK: No JVD CARDIAC: regular rhythm, normal S1 and S2, no rubs or gallops. No murmur. VASCULAR: Radial and DP pulses 2+ bilaterally. No carotid bruits RESPIRATORY:  Clear to auscultation without rales, wheezing or rhonchi   ABDOMEN: Soft, non-tender, non-distended MUSCULOSKELETAL:  Ambulates independently SKIN: Warm and dry, no pitting edema but evidence of chronic venous insufficiency NEUROLOGIC:  Alert and oriented x 3. No focal neuro deficits noted. PSYCHIATRIC:  Normal affect    ASSESSMENT:    1. Palpitation   2. Essential hypertension, benign   3. Chronic venous insufficiency of lower extremity   4. Class 3 severe obesity due to excess calories with serious comorbidity and body mass index (BMI) of 50.0 to 59.9 in adult [facility])   5. Cardiac risk counseling   6. Counseling on health promotion and disease prevention    PLAN:    Palpitations: -we discussed Zio monitor and Kardia mobile today. She would like to proceed with Zio if covered by insurance. She will reach out to her insurance company and let us know if approved. Order placed -reviewed red flag warning signs that need immediate medical attention -reviewed prior echo  Hypertension: -elevated in office today -she reports typically well controlled, has upcoming PCP visit to monitor -for now, continue olmesartan-HCTZ -amlodipine not ideal choice given chronic venous insufficiency  Severe obesity, BMI 57 -she is working on weight loss -she is interested in [place], but it is unclear if this is covered by her insurance. She will discuss with her PCP  Cardiac risk counseling and prevention recommendations: -recommend heart healthy/Mediterranean diet, with whole grains, fruits, vegetable, fish, lean meats, nuts, and olive oil. Limit salt. -recommend moderate walking, 3-5 times/week for 30-50 minutes each session. Aim for at least 150 minutes.week. Goal should be pace of 3 miles/hours, or walking 1.5 miles in 30 minutes -recommend avoidance of tobacco products. Avoid excess alcohol. -ASCVD risk score: The ASCVD Risk score (Arnett DK, et al., 2019) failed to calculate for the following reasons:   The valid total cholesterol range is 130 to 320  mg/dL    Plan for follow up: to be determined based on symptoms/results of monitor  Buford Dresser, MD, PhD, [facility]    Medication Adjustments/Labs and Tests Ordered: Current medicines are reviewed at length with the patient today.  Concerns regarding medicines are outlined above.  Orders Placed This Encounter  Procedures   LONG TERM MONITOR (3-14 DAYS)   EKG 12-Lead   No orders of the defined types were placed in this encounter.   Patient Instructions  Medication Instructions:  Your Physician  recommend you continue on your current medication as directed.    *If you need a refill on your cardiac medications before your next appointment, please call your pharmacy*   Lab Work: None ordered today   Testing/Procedures: Your physician has recommended that you wear a Zio monitor.   This monitor is a medical device that records the hearts electrical activity. Doctors most often use these monitors to diagnose arrhythmias. Arrhythmias are problems with the speed or rhythm of the heartbeat. The monitor is a small device applied to your chest. You can wear one while you do your normal daily activities. While wearing this monitor if you have any symptoms to push the button and record what you felt. Once you have worn this monitor for the period of time provider prescribed (Usually 14 days), you will return the monitor device in the postage paid box. Once it is returned they will download the data collected and provide [place] with a report which the provider will then review and we will call you with those results. Important tips:  Avoid showering during the first 24 hours of wearing the monitor. Avoid excessive sweating to help maximize wear time. Do not submerge the device, no hot tubs, and no swimming pools. Keep any lotions or oils away from the patch. After 24 hours you may shower with the patch on. Take brief showers with your back facing the shower head.   Do not remove patch once it has been placed because that will interrupt data and decrease adhesive wear time. Push the button when you have any symptoms and write down what you were feeling. Once you have completed wearing your monitor, remove and place into box which has postage paid and place in your outgoing mailbox.  If for some reason you have misplaced your box then call our office and we can provide another box and/or mail it off for you.      Follow-Up: At [facility], you and your health needs are our priority.  As part of our continuing mission to provide you with exceptional heart care, we have created designated Provider Care Teams.  These Care Teams include your primary Cardiologist (physician) and Advanced Practice Providers (APPs -  Physician Assistants and Nurse Practitioners) who all work together to provide you with the care you need, when you need it.  We recommend signing up for the patient portal called "MyChart".  Sign up information is provided on this After Visit Summary.  MyChart is used to connect with patients for Virtual Visits (Telemedicine).  Patients are able to view lab/test results, encounter notes, upcoming appointments, etc.  Non-urgent messages can be sent to your provider as well.   To learn more about what you can do with MyChart, go to [email or web address].    Your next appointment:   As needed  The format for your next appointment:   In Person  Provider:   Buford Dresser, MD    [place]: Website: [email or web address]  DR. Harrell Gave RECOMMENDS YOU PURCHASE  " Kardia" By [employer]  [employer]. FROM THE  GOOGLE/[employer]  APP PLAY STORE.  THE APP IS FREE , BUT THE  EQUIPMENT HAS A COST. IT ALLOWS YOU TO OBTAIN A RECORDING OF YOUR HEART RATE AND RHYTHM BY PROVIDING A SHORT STRIP THAT YOU CAN SHARE WITH YOUR PROVIDER.      Signed, Buford Dresser, MD PhD 07/12/2021   [place]

## 2021-07-12 NOTE — Progress Notes (Signed)
Subjective:    Patient ID: Marissa Joseph, female    DOB: 1981/04/19, 41 y.o.   MRN: [id number]  This visit occurred during the SARS-CoV-2 public health emergency.  Safety protocols were in place, including screening questions prior to the visit, additional usage of staff PPE, and extensive cleaning of exam room while observing appropriate contact time as indicated for disinfecting solutions.     HPI The patient is here for follow up.  She was seen here approximately 1 month ago by her nurse practitioner for palpitations and chest pain.  She also noted some shortness of breath.  EKG and physical exam showed no concerning abnormalities.  Blood work was unremarkable.  She saw cardiology yesterday.  A Zio monitor was ordered.  She feels the palpitations, but not as much as she was.  She has not felt them recently - she changed how she was sleeping and that has seemed to help.      Medications and allergies reviewed with patient and updated if appropriate.  Patient Active Problem List   Diagnosis Date Noted   Lower extremity edema 03/24/2021   Acute right ankle pain 02/16/2021   Blurry vision 02/14/2021   Fatigue 02/14/2021   Finger pain, right 11/10/2020   Nausea and vomiting 09/13/2020   Swollen joint 06/09/2020   Infected cyst of skin 06/09/2020   Bilateral leg edema 03/31/2020   Generalized abdominal pain 02/04/2020   Recurrent cellulitis of lower extremity 12/17/2019   Primary osteoarthritis of right knee 07/29/2019   Acute lateral meniscus tear of right knee 07/29/2019   Acute medial meniscus tear of right knee 07/29/2019   Morbid obesity with BMI of 50.0-59.9, adult ([place]) 07/29/2019   Scapular dyskinesis 10/07/2018   Acute back pain 09/25/2018   Adjustment disorder with mixed anxiety and depressed mood 05/10/2018   Amenorrhea 10/10/2017   Ventral hernia without obstruction or gangrene 07/13/2017   Hearing difficulty 11/24/2016   Prediabetes 09/06/2015    Essential hypertension, benign 09/06/2015   Smoking 12/22/2013   Osteoarthritis of left knee 08/29/2013   Pap smear of cervix with ASCUS, cannot exclude HGSIL 06/25/2013   Fibroid uterus 05/22/2013   Dysmenorrhea 05/22/2013   Monilial intertrigo 01/06/2013   ALLERGIC RHINITIS 04/10/2008   HEEL PAIN, LEFT 11/22/2007   Chronic venous insufficiency of lower extremity 08/06/2007   Obstructive sleep apnea 03/25/2007   Eczema 02/21/2007   Asthma 01/21/2007    Current Outpatient Medications on File Prior to Visit  Medication Sig Dispense Refill   albuterol (VENTOLIN HFA) 108 (90 Base) MCG/ACT inhaler Inhale 1-2 puffs into the lungs every 6 (six) hours as needed for wheezing. 18 g 6   bisacodyl (DULCOLAX) 5 MG EC tablet Take 1 tablet (5 mg total) by mouth daily as needed for moderate constipation. 30 tablet 0   budesonide-formoterol (SYMBICORT) 80-4.5 MCG/ACT inhaler Inhale 2 puffs into the lungs 2 (two) times daily. 1 Inhaler 3   celecoxib (CELEBREX) 200 MG capsule Take 200 mg by mouth 2 (two) times daily.     diclofenac Sodium (VOLTAREN) 1 % GEL Apply 4 g topically 4 (four) times daily as needed. 150 g 0   furosemide (LASIX) 20 MG tablet Take 1 tablet (20 mg total) by mouth daily as needed. 90 tablet 1   gabapentin (NEURONTIN) 100 MG capsule Take 2 capsules (200 mg total) by mouth at bedtime. 60 capsule 3   montelukast (SINGULAIR) 10 MG tablet Take 1 tablet (10 mg total) by mouth at  bedtime. 90 tablet 1   olmesartan-hydrochlorothiazide (BENICAR HCT) 20-12.5 MG tablet Take 1 tablet by mouth daily. 90 tablet 1   ondansetron (ZOFRAN ODT) 8 MG disintegrating tablet Take 1 tablet (8 mg total) by mouth every 8 (eight) hours as needed for nausea or vomiting. 20 tablet 0   polyethylene glycol powder (GLYCOLAX/MIRALAX) 17 GM/SCOOP powder Take 17 g by mouth 2 (two) times daily as needed for severe constipation. 116 g 1   No current facility-administered medications on file prior to visit.    Past  Medical History:  Diagnosis Date   Anxiety    no official dx   Asthma    Eczema    Fibroids    GONORRHEA 03/25/2008   Qualifier: Diagnosis of  By: Isla Pence     Heart murmur    Heart murmur    Hypertension    MRSA (methicillin resistant staph aureus) culture positive 05/2012   cellulits/ left leg   Obesity    Sleep apnea     Past Surgical History:  Procedure Laterality Date   NO PAST SURGERIES     TEE WITHOUT CARDIOVERSION N/A 12/22/2019   Procedure: TRANSESOPHAGEAL ECHOCARDIOGRAM (TEE);  Surgeon: Donato Heinz, MD;  Location: [facility] ENDOSCOPY;  Service: Cardiovascular;  Laterality: N/A;    Social History   Socioeconomic History   Marital status: Single    Spouse name: Not on file   Number of children: Not on file   Years of education: Not on file   Highest education level: Not on file  Occupational History   Not on file  Tobacco Use   Smoking status: Every Day    Packs/day: 0.20    Years: 5.00    Pack years: 1.00    Types: Cigarettes   Smokeless tobacco: Current   Tobacco comments:    Vaping  Vaping Use   Vaping Use: Every day  Substance and Sexual Activity   Alcohol use: Yes    Alcohol/week: 0.0 standard drinks    Comment: occ   Drug use: Yes    Types: Marijuana    Comment: pot-about 1 joint BID   Sexual activity: Yes    Birth control/protection: Condom  Other Topics Concern   Not on file  Social History Narrative   Not on file   Social Determinants of Health   Financial Resource Strain: Not on file  Food Insecurity: Not on file  Transportation Needs: Not on file  Physical Activity: Not on file  Stress: Not on file  Social Connections: Not on file    Family History  Problem Relation Age of Onset   Heart disease Maternal Grandmother    Diabetes Maternal Grandmother    Diabetes Father    Diabetes Mother    Cancer Maternal Uncle        prostate   Cancer Paternal Aunt        lung   Stroke Maternal Grandfather     Review of  Systems  Constitutional:  Negative for fever.  Respiratory:  Positive for shortness of breath (chronic, with exertion). Negative for cough and wheezing.   Cardiovascular:  Positive for palpitations (improved) and leg swelling. Negative for chest pain.  Neurological:  Negative for light-headedness and headaches.      Objective:   Vitals:   07/13/21 1346  BP: 130/88  Pulse: 70  Temp: 98.2 F (36.8 C)  SpO2: 99%   BP Readings from Last 3 Encounters:  07/13/21 130/88  07/12/21 (!) 142/90  06/09/21 136/82  Wt Readings from Last 3 Encounters:  07/13/21 (!) 336 lb (152.4 kg)  07/12/21 (!) 338 lb 8 oz (153.5 kg)  06/09/21 (!) 332 lb 9.6 oz (150.9 kg)   Body mass index is 57.67 kg/m.   Physical Exam    Constitutional: Appears well-developed and well-nourished. No distress.  HENT:  Head: Normocephalic and atraumatic.  Neck: Neck supple. No tracheal deviation present. No thyromegaly present.  No cervical lymphadenopathy Cardiovascular: Normal rate, regular rhythm and normal heart sounds.  No murmur heard. No carotid bruit .  Mild b/l LE edema Pulmonary/Chest: Effort normal and breath sounds normal. No respiratory distress. No has no wheezes. No rales.  Skin: Skin is warm and dry. Not diaphoretic.  Psychiatric: Normal mood and affect. Behavior is normal.      Assessment & Plan:    See Problem List for Assessment and Plan of chronic medical problems.     Follow-up in 6 months

## 2021-07-12 NOTE — Patient Instructions (Signed)
Medication Instructions:  Your Physician recommend you continue on your current medication as directed.    *If you need a refill on your cardiac medications before your next appointment, please call your pharmacy*   Lab Work: None ordered today   Testing/Procedures: Your physician has recommended that you wear a Zio monitor.   This monitor is a medical device that records the hearts electrical activity. Doctors most often use these monitors to diagnose arrhythmias. Arrhythmias are problems with the speed or rhythm of the heartbeat. The monitor is a small device applied to your chest. You can wear one while you do your normal daily activities. While wearing this monitor if you have any symptoms to push the button and record what you felt. Once you have worn this monitor for the period of time provider prescribed (Usually 14 days), you will return the monitor device in the postage paid box. Once it is returned they will download the data collected and provide [place] with a report which the provider will then review and we will call you with those results. Important tips:  Avoid showering during the first 24 hours of wearing the monitor. Avoid excessive sweating to help maximize wear time. Do not submerge the device, no hot tubs, and no swimming pools. Keep any lotions or oils away from the patch. After 24 hours you may shower with the patch on. Take brief showers with your back facing the shower head.  Do not remove patch once it has been placed because that will interrupt data and decrease adhesive wear time. Push the button when you have any symptoms and write down what you were feeling. Once you have completed wearing your monitor, remove and place into box which has postage paid and place in your outgoing mailbox.  If for some reason you have misplaced your box then call our office and we can provide another box and/or mail it off for you.      Follow-Up: At [facility], you and your  health needs are our priority.  As part of our continuing mission to provide you with exceptional heart care, we have created designated Provider Care Teams.  These Care Teams include your primary Cardiologist (physician) and Advanced Practice Providers (APPs -  Physician Assistants and Nurse Practitioners) who all work together to provide you with the care you need, when you need it.  We recommend signing up for the patient portal called "MyChart".  Sign up information is provided on this After Visit Summary.  MyChart is used to connect with patients for Virtual Visits (Telemedicine).  Patients are able to view lab/test results, encounter notes, upcoming appointments, etc.  Non-urgent messages can be sent to your provider as well.   To learn more about what you can do with MyChart, go to [email or web address].    Your next appointment:   As needed  The format for your next appointment:   In Person  Provider:   Buford Dresser, MD    [place]: Website: [email or web address]  DR. Harrell Gave RECOMMENDS YOU PURCHASE  " Kardia" By [employer]  [employer] FROM THE  [employer]  APP PLAY STORE.  THE APP IS FREE , BUT THE  EQUIPMENT HAS A COST. IT ALLOWS YOU TO OBTAIN A RECORDING OF YOUR HEART RATE AND RHYTHM BY PROVIDING A SHORT STRIP THAT YOU CAN SHARE WITH YOUR PROVIDER.

## 2021-07-13 ENCOUNTER — Ambulatory Visit (INDEPENDENT_AMBULATORY_CARE_PROVIDER_SITE_OTHER): Payer: PRIVATE HEALTH INSURANCE | Admitting: Internal Medicine

## 2021-07-13 DIAGNOSIS — I10 Essential (primary) hypertension: Secondary | ICD-10-CM | POA: Diagnosis not present

## 2021-07-13 DIAGNOSIS — R002 Palpitations: Secondary | ICD-10-CM | POA: Diagnosis not present

## 2021-07-13 DIAGNOSIS — Z6841 Body Mass Index (BMI) 40.0 and over, adult: Secondary | ICD-10-CM | POA: Diagnosis not present

## 2021-07-13 NOTE — Assessment & Plan Note (Signed)
Chronic She is working on weight loss Discussed continuing diet changes, regular exercise Discussed that there are weight loss medications that may help, but I am concerned it may not be covered by her insurance She knows that surgery is an option Encouraged her to continue her weight loss efforts-especially decreased portions and diet low in carbs/sugars

## 2021-07-13 NOTE — Assessment & Plan Note (Signed)
Subacute Was seen here 1 month ago and EKG and blood work at that time was normal Did see cardiology yesterday-ZIO monitor ordered-she will check to see if this is covered by her insurance Palpitations have improved after changing her sleeping arrangements-has not felt them in a while Still recommend Zio patch just to be on the safe side

## 2021-07-13 NOTE — Patient Instructions (Addendum)
° ° ° °  Medications changes include :   none    Follow up in 6 months

## 2021-07-13 NOTE — Assessment & Plan Note (Signed)
Chronic Blood pressure borderline high-should ideally be lower  Stressed low-sodium diet, decrease portions and weight loss Continue Benicar HCT 20-12.5 mg daily

## 2021-07-18 ENCOUNTER — Encounter (HOSPITAL_BASED_OUTPATIENT_CLINIC_OR_DEPARTMENT_OTHER): Payer: Self-pay | Admitting: Cardiology

## 2021-08-02 ENCOUNTER — Encounter (HOSPITAL_BASED_OUTPATIENT_CLINIC_OR_DEPARTMENT_OTHER): Payer: Self-pay

## 2021-08-02 DIAGNOSIS — Z7189 Other specified counseling: Secondary | ICD-10-CM

## 2021-08-02 NOTE — Telephone Encounter (Signed)
Agree with referral to PREP for obesity. TY!  Loel Dubonnet, NP

## 2021-08-02 NOTE — Telephone Encounter (Signed)
Would it be okay for me to tell this patient about the prep program?

## 2021-08-05 ENCOUNTER — Telehealth: Payer: Self-pay

## 2021-08-05 NOTE — Telephone Encounter (Signed)
Called to discuss PREP program, she wants to participate; prefers [facility] class in April; will contact later this month to confirm dates/time and set up assessment visit. ?

## 2021-08-09 ENCOUNTER — Other Ambulatory Visit: Payer: Self-pay | Admitting: Physician Assistant

## 2021-08-09 ENCOUNTER — Encounter: Payer: Self-pay | Admitting: Orthopaedic Surgery

## 2021-08-09 ENCOUNTER — Other Ambulatory Visit: Payer: Self-pay

## 2021-08-09 ENCOUNTER — Ambulatory Visit (INDEPENDENT_AMBULATORY_CARE_PROVIDER_SITE_OTHER): Payer: PRIVATE HEALTH INSURANCE

## 2021-08-09 ENCOUNTER — Ambulatory Visit (INDEPENDENT_AMBULATORY_CARE_PROVIDER_SITE_OTHER): Payer: PRIVATE HEALTH INSURANCE | Admitting: Orthopaedic Surgery

## 2021-08-09 VITALS — Ht 64.0 in | Wt 330.0 lb

## 2021-08-09 DIAGNOSIS — M25571 Pain in right ankle and joints of right foot: Secondary | ICD-10-CM

## 2021-08-09 DIAGNOSIS — Z6841 Body Mass Index (BMI) 40.0 and over, adult: Secondary | ICD-10-CM

## 2021-08-09 DIAGNOSIS — M1712 Unilateral primary osteoarthritis, left knee: Secondary | ICD-10-CM

## 2021-08-09 MED ORDER — LIDOCAINE HCL 1 % IJ SOLN
2.0000 mL | INTRAMUSCULAR | Status: AC | PRN
  Administered 2021-08-09: 2 mL

## 2021-08-09 MED ORDER — BUPIVACAINE HCL 0.25 % IJ SOLN
0.6600 mL | INTRAMUSCULAR | Status: AC | PRN
  Administered 2021-08-09: .66 mL via INTRA_ARTICULAR

## 2021-08-09 MED ORDER — METHYLPREDNISOLONE ACETATE 40 MG/ML IJ SUSP
40.0000 mg | INTRAMUSCULAR | Status: AC | PRN
  Administered 2021-08-09: 40 mg via INTRA_ARTICULAR

## 2021-08-09 MED ORDER — BUPIVACAINE HCL 0.25 % IJ SOLN
2.0000 mL | INTRAMUSCULAR | Status: AC | PRN
  Administered 2021-08-09: 2 mL via INTRA_ARTICULAR

## 2021-08-09 MED ORDER — LIDOCAINE HCL 1 % IJ SOLN
1.0000 mL | INTRAMUSCULAR | Status: AC | PRN
  Administered 2021-08-09: 1 mL

## 2021-08-09 MED ORDER — TRAMADOL HCL 50 MG PO TABS: 50.0000 mg | ORAL_TABLET | Freq: Two times a day (BID) | ORAL | 2 refills | Status: DC | PRN

## 2021-08-09 NOTE — Progress Notes (Signed)
? ?Office Visit Note ?  ?Patient: Marissa Joseph           ?Date of Birth: [birth year]           ?MRN: [id number] ?Visit Date: 08/09/2021 ?             ?Requested by: Binnie Rail, MD ?[place] [place],  [place] [id number] ?PCP: Binnie Rail, MD ? ? ?Assessment & Plan: ?Visit Diagnoses:  ?1. Primary osteoarthritis of left knee   ?2. Pain in right ankle and joints of right foot   ?3. Body mass index 50.0-59.9, adult ([place])   ?4. Morbid obesity ([place])   ? ? ?Plan: Impression is advanced degenerative joint disease left knee and right ankle joint.  I have discussed proceeding with cortisone injections to both areas.  She would like to proceed.  I would also like to provide her with a cam boot to wear on the right ankle for the next few weeks to help settle things down.  She will follow-up with [place] as needed. ? ?Follow-Up Instructions: Return if symptoms worsen or fail to improve.  ? ?Orders:  ?Orders Placed This Encounter  ?Procedures  ? Large Joint Inj: L knee  ? Medium Joint Inj: R ankle  ? XR KNEE 3 VIEW LEFT  ? XR Ankle Complete Right  ? ?Meds ordered this encounter  ?Medications  ? traMADol (ULTRAM) 50 MG tablet  ?  Sig: Take 1 tablet (50 mg total) by mouth every 12 (twelve) hours as needed.  ?  Dispense:  30 tablet  ?  Refill:  2  ? ? ? ? Procedures: ?Large Joint Inj: L knee on 08/09/2021 11:24 AM ?Indications: pain ?Details: 22 G needle, anterolateral approach ?Medications: 2 mL lidocaine 1 %; 2 mL bupivacaine 0.25 %; 40 mg methylPREDNISolone acetate 40 MG/ML ? ? ?Medium Joint Inj: R ankle on 08/09/2021 11:24 AM ?Indications: pain ?Medications: 1 mL lidocaine 1 %; 0.66 mL bupivacaine 0.25 %; 40 mg methylPREDNISolone acetate 40 MG/ML ? ? ? ? ?Clinical Data: ?No additional findings. ? ? ?Subjective: ?Chief Complaint  ?Patient presents with  ? Left Knee - Pain  ? Right Ankle - Pain  ? ? ?HPI patient is a pleasant 41 year old female who comes in today with left knee and right ankle pain.  In regards to the left  knee, she has had pain for years which is worsened over the past few months.  She works on concrete floors and does a lot of standing throughout the day.  She denies any specific injury.  The pain is to the entire knee worse going from a seated to standing position as well as the first few steps she takes and it into the day after she has been working.  She has been taking Tylenol without significant relief.  No previous cortisone injections of the left knee.  In regards to the right ankle, she has a history of ankle sprain in the past.  The pain she has been dealing with is been ongoing for the past few months.  The pain is to the entire ankle and occurs at rest and with activity.  She has been wearing a copper brace which does seem to provide some support.  Tylenol does not significantly help her pain. ? ?Review of Systems as detailed in HPI.  All others reviewed and are negative. ? ? ?Objective: ?Vital Signs: Ht '5\' 4"'$  (1.626 m)   Wt (!) 330 lb (149.7 kg)   BMI  56.64 kg/m?  ? ?Physical Exam well-developed well-nourished female in no acute distress.  Alert and oriented x3. ? ?Ortho Exam left knee exam shows range of motion from 0 to 100 degrees.  Mild medial joint line tenderness.  Mild patellofemoral crepitus.  Ligaments are stable.  She is neurovascular tact distally.  Right ankle exam shows moderate swelling.  Pes planus.  Moderate tenderness along the posterior tibial tendon.  Minimal pain with range of motion of the ankle.  She is neurovascular intact distally. ? ?Specialty Comments:  ?No specialty comments available. ? ?Imaging: ?XR Ankle Complete Right ? ?Result Date: 08/09/2021 ?Significant degenerative changes throughout the entire ankle with evidence of probable prior syndesmosis injury based on ossification at the distal tibia/fibula ? ?XR KNEE 3 VIEW LEFT ? ?Result Date: 08/09/2021 ?Advanced degenerative changes medial and patellofemoral compartments  ? ? ?PMFS History: ?Patient Active Problem List  ?  Diagnosis Date Noted  ? Palpitations 07/13/2021  ? Lower extremity edema 03/24/2021  ? Acute right ankle pain 02/16/2021  ? Blurry vision 02/14/2021  ? Fatigue 02/14/2021  ? Finger pain, right 11/10/2020  ? Nausea and vomiting 09/13/2020  ? Swollen joint 06/09/2020  ? Infected cyst of skin 06/09/2020  ? Bilateral leg edema 03/31/2020  ? Generalized abdominal pain 02/04/2020  ? Recurrent cellulitis of lower extremity 12/17/2019  ? Primary osteoarthritis of right knee 07/29/2019  ? Acute lateral meniscus tear of right knee 07/29/2019  ? Acute medial meniscus tear of right knee 07/29/2019  ? Morbid obesity with BMI of 50.0-59.9, adult ([place]) 07/29/2019  ? Scapular dyskinesis 10/07/2018  ? Acute back pain 09/25/2018  ? Adjustment disorder with mixed anxiety and depressed mood 05/10/2018  ? Amenorrhea 10/10/2017  ? Ventral hernia without obstruction or gangrene 07/13/2017  ? Hearing difficulty 11/24/2016  ? Prediabetes 09/06/2015  ? Essential hypertension, benign 09/06/2015  ? Smoking 12/22/2013  ? Osteoarthritis of left knee 08/29/2013  ? Pap smear of cervix with ASCUS, cannot exclude HGSIL 06/25/2013  ? Fibroid uterus 05/22/2013  ? Dysmenorrhea 05/22/2013  ? Monilial intertrigo 01/06/2013  ? ALLERGIC RHINITIS 04/10/2008  ? HEEL PAIN, LEFT 11/22/2007  ? Chronic venous insufficiency of lower extremity 08/06/2007  ? Obstructive sleep apnea 03/25/2007  ? Eczema 02/21/2007  ? Asthma 01/21/2007  ? ?Past Medical History:  ?Diagnosis Date  ? Anxiety   ? no official dx  ? Asthma   ? Eczema   ? Fibroids   ? GONORRHEA 03/25/2008  ? Qualifier: Diagnosis of  By: Isla Pence    ? Heart murmur   ? Heart murmur   ? Hypertension   ? MRSA (methicillin resistant staph aureus) culture positive 05/2012  ? cellulits/ left leg  ? Obesity   ? Sleep apnea   ?  ?Family History  ?Problem Relation Age of Onset  ? Heart disease Maternal Grandmother   ? Diabetes Maternal Grandmother   ? Diabetes Father   ? Diabetes Mother   ? Cancer Maternal  Uncle   ?     prostate  ? Cancer Paternal Aunt   ?     lung  ? Stroke Maternal Grandfather   ?  ?Past Surgical History:  ?Procedure Laterality Date  ? NO PAST SURGERIES    ? TEE WITHOUT CARDIOVERSION N/A 12/22/2019  ? Procedure: TRANSESOPHAGEAL ECHOCARDIOGRAM (TEE);  Surgeon: Donato Heinz, MD;  Location: [facility];  Service: Cardiovascular;  Laterality: N/A;  ? ?Social History  ? ?Occupational History  ? Not on file  ?Tobacco Use  ?  Smoking status: Every Day  ?  Packs/day: 0.20  ?  Years: 5.00  ?  Pack years: 1.00  ?  Types: Cigarettes  ? Smokeless tobacco: Current  ? Tobacco comments:  ?  Vaping  ?Vaping Use  ? Vaping Use: Every day  ?Substance and Sexual Activity  ? Alcohol use: Yes  ?  Alcohol/week: 0.0 standard drinks  ?  Comment: occ  ? Drug use: Yes  ?  Types: Marijuana  ?  Comment: pot-about 1 joint BID  ? Sexual activity: Yes  ?  Birth control/protection: Condom  ? ? ? ? ? ? ?

## 2021-09-07 ENCOUNTER — Telehealth: Payer: Self-pay

## 2021-09-07 NOTE — Telephone Encounter (Signed)
Called to confirm starting PREP class at [facility] 4/17, every M/W 4-5:15; assessment visit scheduled for 4/11 at 9:30am. ?

## 2021-09-13 NOTE — Progress Notes (Signed)
[facility] PREP Evaluation ? ?Patient Details  ?Name: Marissa Joseph ?MRN: [id number] ?Date of Birth: 1981/04/04 ?Age: 41 y.o. ?PCP: Binnie Rail, MD ? ?Vitals:  ? 09/13/21 1012  ?BP: 116/80  ?Pulse: 79  ?SpO2: 95%  ?Weight: (!) 339 lb 3.2 oz (153.9 kg)  ? ? ? [facility] Eval - 09/13/21 1000   ? ?  ? [facility]PREP[facility] Location  ? [facility]   ?  ? Referral   ? Referring Provider Vella Raring   ? Reason for referral Inactivity;Obesitity/Overweight;Hypertension;Current Smoker   ? Program Start Date 09/19/21   ?  ? Measurement  ? Waist Circumference 63.2 inches   ? Hip Circumference 70 inches   ? Body fat --   E4  ?  ? Information for Trainer  ? Goals --   Lost 15-20 lbs by end of program; establish an exercise routine  ? Current Exercise --   stretching  ? Orthopedic Concerns --   OA knees  ? Pertinent Medical History --   HTN, CHF  ?  ? Timed Up and Go (TUGS)  ? Timed Up and Go Low risk <9 seconds   ?  ? Mobility and Daily Activities  ? I find it easy to walk up or down two or more flights of stairs. 1   ? I have no trouble taking out the trash. 4   ? I do housework such as vacuuming and dusting on my own without difficulty. 4   ? I can easily lift a gallon of milk (8lbs). 4   ? I can easily walk a mile. 2   ? I have no trouble reaching into high cupboards or reaching down to pick up something from the floor. 2   ? I do not have trouble doing out-door work such as Armed forces logistics/support/administrative officer, raking leaves, or gardening. 3   ?  ? Mobility and Daily Activities  ? I feel younger than my age. 4   ? I feel independent. 4   ? I feel energetic. 2   ? I live an active life.  1   ? I feel strong. 2   ? I feel healthy. 2   ? I feel active as other people my age. 1   ?  ? How fit and strong are you.  ? Fit and Strong Total Score 36   ? ?  ?  ? ?  ? ?Past Medical History:  ?Diagnosis Date  ? Anxiety   ? no official dx  ? Asthma   ? Eczema   ? Fibroids   ? GONORRHEA 03/25/2008  ? Qualifier: Diagnosis of  By: Isla Pence     ? Heart murmur   ? Heart murmur   ? Hypertension   ? MRSA (methicillin resistant staph aureus) culture positive 05/2012  ? cellulits/ left leg  ? Obesity   ? Sleep apnea   ? ?Past Surgical History:  ?Procedure Laterality Date  ? NO PAST SURGERIES    ? TEE WITHOUT CARDIOVERSION N/A 12/22/2019  ? Procedure: TRANSESOPHAGEAL ECHOCARDIOGRAM (TEE);  Surgeon: Donato Heinz, MD;  Location: [place];  Service: Cardiovascular;  Laterality: N/A;  ? ?Social History  ? ?Tobacco Use  ?Smoking Status Every Day  ? Packs/day: 0.20  ? Years: 5.00  ? Pack years: 1.00  ? Types: Cigarettes  ?Smokeless Tobacco Current  ?Tobacco Comments  ? Vaping  ?To begin PREP class at [facility] starting April 17, every M/W  4-5:15pm ? ?[place] ?09/13/2021, 10:17 AM ? ? ?

## 2021-10-11 ENCOUNTER — Other Ambulatory Visit: Payer: Self-pay | Admitting: Internal Medicine

## 2021-10-11 NOTE — Progress Notes (Signed)
[facility] PREP Weekly Session ? ?Patient Details  ?Name: Marissa Joseph ?MRN: [id number] ?Date of Birth: 1980/12/05 ?Age: 41 y.o. ?PCP: Binnie Rail, MD ? ?There were no vitals filed for this visit. ? ? [facility] Weekly seesion - 10/11/21 1100   ? ?  ? [facility]PREP[facility] Location  ? [facility]   ?  ? Weekly Session  ? Topic Discussed Goal setting and welcome to the program   Tour of facilty, review of workbook, offered intro to cardio machine  ? Classes attended to date 1   ? ?  ?  ? ?  ? ? ?Zachery Dakins [name] ?10/11/2021, 11:55 AM ? ? ?

## 2021-10-17 NOTE — Telephone Encounter (Signed)
err

## 2021-10-25 NOTE — Progress Notes (Signed)
[facility] PREP Weekly Session  Patient Details  Name: Marissa Joseph MRN: [id number] Date of Birth: 1980/10/26 Age: 41 y.o. PCP: Binnie Rail, MD  Vitals:   10/25/21 1156  Weight: (!) 337 lb 1.6 oz (152.9 kg)     [facility] Weekly seesion - 10/25/21 1100       [facility] "PREP" Location   [facility]PREP[facility] Location [facility]      Weekly Session   Topic Discussed Healthy eating tips   recipes on bulletin board, YUKA app, eat the rainbow of colors   Minutes exercised this week 55 minutes    Classes attended to date [place] 10/25/2021, 11:56 AM

## 2021-11-01 NOTE — Progress Notes (Signed)
[facility] PREP Weekly Session  Patient Details  Name: Marissa Joseph MRN: [id number] Date of Birth: 08/08/1980 Age: 41 y.o. PCP: Binnie Rail, MD  Vitals:   11/01/21 1126  Weight: (!) 337 lb 3.2 oz (153 kg)     [facility] Weekly seesion - 11/01/21 1100       [facility] "PREP" Location   [facility]PREP[facility] Location [facility]      Weekly Session   Topic Discussed Health habits   Sugar demo   Classes attended to date [place] 11/01/2021, 11:26 AM

## 2021-11-08 ENCOUNTER — Encounter: Payer: Self-pay | Admitting: *Deleted

## 2021-11-08 NOTE — Progress Notes (Signed)
[facility] PREP Weekly Session  Patient Details  Name: TESSICA CUPO MRN: [id number] Date of Birth: 09-Jul-1980 Age: 41 y.o. PCP: Binnie Rail, MD  Vitals:   11/08/21 1159  Weight: (!) 334 lb (151.5 kg)     [facility] Weekly seesion - 11/08/21 1200       [facility] "PREP" Location   [facility]PREP[facility] Location [facility]      Weekly Session   Topic Discussed Restaurant Eating   Salt demo, reminder to limit salt intake to 1500-'2300mg'$ /day   Classes attended to date [place], [place] 11/08/2021, 12:03 PM

## 2021-11-17 ENCOUNTER — Encounter: Payer: Self-pay | Admitting: *Deleted

## 2021-11-17 NOTE — Progress Notes (Signed)
[facility] PREP Weekly Session  Patient Details  Name: Marissa Joseph MRN: [id number] Date of Birth: 1981-05-01 Age: 41 y.o. PCP: Binnie Rail, MD  Vitals:   11/17/21 1139  Weight: (!) 331 lb 12.8 oz (150.5 kg)     [facility] Weekly seesion - 11/17/21 1100       [facility] "PREP" Location   [facility] "PREP[facility] Location [facility]      Weekly Session   Topic Discussed Stress management and problem solving    Minutes exercised this week 25 minutes    Classes attended to date [place], [place] 11/17/2021, 11:41 AM

## 2021-11-20 ENCOUNTER — Encounter: Payer: Self-pay | Admitting: Internal Medicine

## 2021-11-22 ENCOUNTER — Encounter: Payer: Self-pay | Admitting: *Deleted

## 2021-11-22 NOTE — Progress Notes (Signed)
[facility] PREP Weekly Session  Patient Details  Name: Marissa Joseph MRN: [id number] Date of Birth: Oct 09, 1980 Age: 41 y.o. PCP: Binnie Rail, MD  Vitals:   11/22/21 1112  Weight: (!) 330 lb 12.8 oz (150 kg)     [facility] Weekly seesion - 11/22/21 1100       Weekly Session   Topic Discussed Expectations and non-scale victories   Half way through program, Review and reset goals.   Minutes exercised this week 110 minutes    Classes attended to date [place], [place] 11/22/2021, 11:15 AM

## 2021-11-24 ENCOUNTER — Encounter: Payer: Self-pay | Admitting: Internal Medicine

## 2021-11-25 ENCOUNTER — Ambulatory Visit (INDEPENDENT_AMBULATORY_CARE_PROVIDER_SITE_OTHER): Payer: PRIVATE HEALTH INSURANCE | Admitting: Internal Medicine

## 2021-11-25 ENCOUNTER — Encounter: Payer: Self-pay | Admitting: Internal Medicine

## 2021-11-25 DIAGNOSIS — T148XXA Other injury of unspecified body region, initial encounter: Secondary | ICD-10-CM

## 2021-11-25 DIAGNOSIS — L089 Local infection of the skin and subcutaneous tissue, unspecified: Secondary | ICD-10-CM

## 2021-11-25 DIAGNOSIS — L729 Follicular cyst of the skin and subcutaneous tissue, unspecified: Secondary | ICD-10-CM

## 2021-11-25 MED ORDER — MONTELUKAST SODIUM 10 MG PO TABS: 10.0000 mg | ORAL_TABLET | Freq: Every day | ORAL | 1 refills | Status: AC

## 2021-11-25 MED ORDER — AMOXICILLIN-POT CLAVULANATE 875-125 MG PO TABS: 1.0000 | ORAL_TABLET | Freq: Two times a day (BID) | ORAL | 0 refills | Status: AC

## 2021-11-25 MED ORDER — GABAPENTIN 100 MG PO CAPS: 200.0000 mg | ORAL_CAPSULE | Freq: Every day | ORAL | 3 refills | Status: DC

## 2021-11-25 MED ORDER — FUROSEMIDE 20 MG PO TABS: 20.0000 mg | ORAL_TABLET | Freq: Every day | ORAL | 1 refills | Status: DC | PRN

## 2021-11-25 MED ORDER — ALBUTEROL SULFATE HFA 108 (90 BASE) MCG/ACT IN AERS: 1.0000 | INHALATION_SPRAY | Freq: Four times a day (QID) | RESPIRATORY_TRACT | 6 refills | Status: DC | PRN

## 2021-11-25 MED ORDER — METHOCARBAMOL 500 MG PO TABS: 500.0000 mg | ORAL_TABLET | Freq: Three times a day (TID) | ORAL | 0 refills | Status: AC | PRN

## 2021-11-25 NOTE — Assessment & Plan Note (Signed)
Acute Left upper back muscle strain near left scapula Start methocarbamol 500 mg every 8 hours.

## 2021-11-29 ENCOUNTER — Encounter: Payer: Self-pay | Admitting: *Deleted

## 2021-12-13 ENCOUNTER — Encounter: Payer: Self-pay | Admitting: *Deleted

## 2021-12-13 NOTE — Progress Notes (Signed)
[facility] PREP Weekly Session  Patient Details  Name: Marissa Joseph MRN: [id number] Date of Birth: [birth year] Age: 41 y.o. PCP: Binnie Rail, MD  Vitals:   12/13/21 1134  Weight: (!) 328 lb (148.8 kg)     [facility] Weekly seesion - 12/13/21 1100       [facility] "PREP" Location   [facility] "PREP" Location [facility]      Weekly Session   Topic Discussed Finding support   Membership talk by Merrilee Seashore, review of food labels   Minutes exercised this week 100 minutes    Classes attended to date [place], [place] 12/13/2021, 11:36 AM

## 2021-12-20 NOTE — Progress Notes (Signed)
[facility] PREP Weekly Session  Patient Details  Name: Marissa Joseph MRN: [id number] Date of Birth: 28-Jan-1981 Age: 41 y.o. PCP: Binnie Rail, MD  Vitals:   12/20/21 1143  Weight: (!) 330 lb 3.2 oz (149.8 kg)     [facility] Weekly seesion - 12/20/21 1100       [facility] "PREP" Location   [facility] "PREP" Location [facility]      Weekly Session   Topic Discussed Calorie breakdown   Carbs, fats, proteins % recomendations including in depth discuss on difference between simple and complex carbs; review of goals and activity for next 12 weeks.   Minutes exercised this week 30 minutes    Classes attended to date [place] 12/20/2021, 11:44 AM

## 2021-12-27 ENCOUNTER — Encounter: Payer: Self-pay | Admitting: *Deleted

## 2021-12-27 NOTE — Progress Notes (Signed)
[facility] PREP Weekly Session  Patient Details  Name: Marissa Joseph MRN: [id number] Date of Birth: [birth year] Age: 41 y.o. PCP: Binnie Rail, MD  Vitals:   12/27/21 1145  Weight: (!) 328 lb 3.2 oz (148.9 kg)     [facility] Weekly seesion - 12/27/21 1100       [facility] "PREP" Location   [facility] "PREP" Location [facility]      Weekly Session   Topic Discussed Hitting roadblocks   Review of goals and activity plan to bring on thursday, Completed FIT testing and How Fit and Strong survey.   Minutes exercised this week 75 minutes    Classes attended to date [place], [place] 12/27/2021, 11:46 AM

## 2021-12-29 ENCOUNTER — Telehealth (HOSPITAL_BASED_OUTPATIENT_CLINIC_OR_DEPARTMENT_OTHER): Payer: Self-pay

## 2021-12-29 ENCOUNTER — Telehealth: Payer: Self-pay | Admitting: Licensed Clinical Social Worker

## 2021-12-29 ENCOUNTER — Telehealth: Payer: Self-pay

## 2021-12-29 DIAGNOSIS — Z7189 Other specified counseling: Secondary | ICD-10-CM

## 2021-12-29 NOTE — Telephone Encounter (Signed)
Received call from [place], [place] for Dr. Harrell Gave; discussed pt's BP at [place] final assessment visit, unable to afford BP meds, also discussed pt's affect/mood as she is very tearful and doesn't like asking for help; requested if she could be referred to care guide or SW for social support/help. Pt did give approval for consult and follow up from Dr. Judeth Cornfield office.

## 2021-12-29 NOTE — Progress Notes (Signed)
[facility] PREP Evaluation  Patient Details  Name: Marissa Joseph MRN: [id number] Date of Birth: July 12, 1980 Age: 41 y.o. PCP: Binnie Rail, MD  Vitals:   12/29/21 1309  BP: (!) 142/100  Pulse: 75  SpO2: 98%  Weight: (!) 328 lb 3.2 oz (148.9 kg)     [facility] Eval - 12/29/21 1300       [facility] "PREP" Location   [facility] "PREP" Location [place]      Referral    Program Start Date 12/29/21   Program end date     Measurement   Waist Circumference 60.5 inches    Hip Circumference 69.5 inches      Mobility and Daily Activities   I find it easy to walk up or down two or more flights of stairs. 2    I have no trouble taking out the trash. 3    I do housework such as vacuuming and dusting on my own without difficulty. 4    I can easily lift a gallon of milk (8lbs). 3    I can easily walk a mile. 3    I have no trouble reaching into high cupboards or reaching down to pick up something from the floor. 2    I do not have trouble doing out-door work such as Armed forces logistics/support/administrative officer, raking leaves, or gardening. 2      Mobility and Daily Activities   I feel younger than my age. 3    I feel independent. 4    I feel energetic. 2    I live an active life.  3    I feel strong. 2    I feel healthy. 2    I feel active as other people my age. 3      How fit and strong are you.   Fit and Strong Total Score 38            Past Medical History:  Diagnosis Date   Anxiety    no official dx   Asthma    Eczema    Fibroids    GONORRHEA 03/25/2008   Qualifier: Diagnosis of  By: Isla Pence     Heart murmur    Heart murmur    Hypertension    MRSA (methicillin resistant staph aureus) culture positive 05/2012   cellulits/ left leg   Obesity    Sleep apnea    Past Surgical History:  Procedure Laterality Date   NO PAST SURGERIES     TEE WITHOUT CARDIOVERSION N/A 12/22/2019   Procedure: TRANSESOPHAGEAL ECHOCARDIOGRAM (TEE);  Surgeon: Donato Heinz, MD;  Location: [facility]  ENDOSCOPY;  Service: Cardiovascular;  Laterality: N/A;   Social History   Tobacco Use  Smoking Status Every Day   Packs/day: 0.20   Years: 5.00   Total pack years: 1.00   Types: Cigarettes  Smokeless Tobacco Current  Tobacco Comments   Vaping  Wt loss: 9 lbs How fit strong survey:  4/10: 36     7/27: 94 Long discussion about physical and mental health challenges along with importance of taking BP meds; will follow up w/Dr. Frederik Pear.   Marissa Joseph 12/29/2021, 1:27 PM

## 2021-12-29 NOTE — Telephone Encounter (Signed)
H&V Care Navigation CSW Progress Note  Clinical Social Worker contacted patient by phone to f/u on referral from [place] team. Noted concern about insurance coverage, I called benefits line for [facility], per answering service benefits for this period began 12/03/2021. Pt may be able to utilize GoodRx for medication costs.  No answer at [phone]. Left message requesting return call if pt interested in support. Will re-attempt as able.  Patient is participating in a Managed   Medicaid Plan:  No, commercial insurance only ([place])  SDOH Screenings   Alcohol Screen: Not on file  Depression (PHQ2-9): Low Risk  (07/18/2021)   Depression (PHQ2-9)    PHQ-2 Score: 4  Financial Resource Strain: Not on file  Food Insecurity: Not on file  Housing: Not on file  Physical Activity: Not on file  Social Connections: Not on file  Stress: Not on file  Tobacco Use: High Risk (11/25/2021)   Patient History    Smoking Tobacco Use: Every Day    Smokeless Tobacco Use: Current    Passive Exposure: Not on file  Transportation Needs: Not on file   Westley Hummer, MSW, [place]  [phone]- work cell phone (preferred) [phone]- desk phone

## 2021-12-29 NOTE — Telephone Encounter (Signed)
-  Spoke with Zacarias Pontes for Prep program. -She report pt completed program today, but has some concerns regarding pt. -She stated pt reported she hasn't taking her blood pressure medication in 2 weeks due to financial reason. BP today 142/100 -She also stated pt feels her insurance may be cancelled due to missing open enrollment and not completing information. -Zacarias Pontes reports pt is very depressed and tearful and feels she may benefit from social and financial support.  Will forward message to care navigator team for review.  MD made aware

## 2022-01-02 ENCOUNTER — Telehealth: Payer: Self-pay | Admitting: Licensed Clinical Social Worker

## 2022-01-03 NOTE — Progress Notes (Signed)
Heart and Vascular Care Navigation  01/02/2022  Marissa Joseph 10-Jun-1980 [id number]  Reason for Referral:  Patient is participating in a Managed Medicaid Plan: No, commercial plan  Engaged with patient by telephone for follow up visit for Heart and Vascular Care Coordination.                                                                                                   Assessment:  LCSW able to reach pt at [phone]. Introduced self, role, reason for call. Pt shares that she is at work but able to speak. Confirms home address, PCP, and her emergency contact and main support is her mother. She lives alone, works as a Forensic scientist.   We discussed that I was able to verify current insurance benefits. Due to work changes her daily work load has been variable meaning income has also been more variable. She has had issues specifically with her medications and housing. We discussed reapplying for SNAP due to these changes. See below for all resources provided.   We also discussed mental health resources to assist with managing the stress that goes along with managing your health, finances, family and friends. Pt has been hesitant due to cost. I will send her several options that accept sliding scale, may have stable costs, or be free as part of larger university research. Pt interested in all these resources being sent to her mailing address.  Pt encouraged to f/u with me if any additional questions/concerns. I also discussed patient care fund assistance and pt will attempt to gather income documents and rental agreement to complete request for assistance.                                      HRT/VAS Care Coordination     Patients Home Cardiology Office --  [place] Team Social Worker   Social Worker Name: Valeda Malm, [place] [phone]   Living arrangements for the past 2 months Apartment   Lives with: Self; Pets   Patient Current  Insurance Sports coach   Patient Has Concern With Paying Medical Bills Yes   Patient Concerns With Medical Bills costs in general/costs of prescriptions   Medical Bill Referrals: [facility]rx/[facility]/patient care fund   Home Assistive Devices/Equipment Eyeglasses       Social History:                                                                             SDOH Screenings   Alcohol Screen: Not on file  Depression (PHQ2-9): Low Risk  (07/18/2021)   Depression (PHQ2-9)    PHQ-2 Score: 4  Financial Resource Strain: High Risk (01/02/2022)   Overall Financial  Resource Strain (CARDIA)    Difficulty of Paying Living Expenses: Hard  Food Insecurity: Food Insecurity Present (01/02/2022)   Hunger Vital Sign    Worried About Running Out of Food in the Last Year: Sometimes true    Ran Out of Food in the Last Year: Sometimes true  Housing: Medium Risk (01/02/2022)   Housing    Last Housing Risk Score: 1  Physical Activity: Not on file  Social Connections: Not on file  Stress: Stress Concern Present (01/02/2022)   [place]    Feeling of Stress : Rather much  Tobacco Use: High Risk (11/25/2021)   Patient History    Smoking Tobacco Use: Every Day    Smokeless Tobacco Use: Current    Passive Exposure: Not on file  Transportation Needs: No Transportation Needs (01/02/2022)   PRAPARE - Transportation    Lack of Transportation (Medical): No    Lack of Transportation (Non-Medical): No    SDOH Interventions: Financial Resources:  Financial Strain Interventions: Other (Comment) (Patient Care Fund Referral, Pharmacy/GoodRx information, Housing Clinics)  Food Insecurity:  Food Insecurity Interventions: Other (Comment) (Second [employer] administrator)  Housing Insecurity:  Housing Interventions: Other (Comment) ([facility] flyers)  Transportation:   Transportation Interventions:  Intervention Not Indicated    Other Care Navigation Interventions:     Provided Pharmacy assistance resources  Good Rx card, OTC list from [employer], list of medications with where most affordable  Patient expressed Mental Health concerns Yes, Referred to:  [facility] [place], [place], [employer], [place] list, [employer], and [facility]   Follow-up plan:   LCSW has mailed pt the following: my card, information about medications, OTC list, [employer], Patient Care Fund, Yes, Referred to:  [facility] [place], [place], [employer], [place] list, [employer], and [facility]. Also mailed GoodRx card and information about food resources. Pt will gather information about her income etc for consideration for rental assistance. Pt encouraged to call me with any questions/concerns.

## 2022-01-04 ENCOUNTER — Telehealth: Payer: Self-pay | Admitting: Licensed Clinical Social Worker

## 2022-01-05 NOTE — Telephone Encounter (Signed)
H&V Care Navigation CSW Progress Note  01/04/2022: Clinical Social Worker  was contacted by pt  to f/u on assistance from [facility] [place]. Pt has sent [place] her tax return and lease/w9 has been sent by her leasing agency. I am waiting on agreement form.   01/05/2022: Pt sent me agreement form however it has formatted differently, I have resent it as a PDF in hopes it can arrive to her in same format that Patient Care Fund needs.   Patient is participating in a Managed Medicaid Plan:  No, commercial insurance only ([employer]).   SDOH Screenings   Alcohol Screen: Not on file  Depression (PHQ2-9): Low Risk  (07/18/2021)   Depression (PHQ2-9)    PHQ-2 Score: 4  Financial Resource Strain: High Risk (01/02/2022)   Overall Financial Resource Strain (CARDIA)    Difficulty of Paying Living Expenses: Hard  Food Insecurity: Food Insecurity Present (01/02/2022)   Hunger Vital Sign    Worried About Running Out of Food in the Last Year: Sometimes true    Ran Out of Food in the Last Year: Sometimes true  Housing: Medium Risk (01/02/2022)   Housing    Last Housing Risk Score: 1  Physical Activity: Not on file  Social Connections: Not on file  Stress: Stress Concern Present (01/02/2022)   [employer] [facility] [place]    Feeling of Stress : Rather much  Tobacco Use: High Risk (11/25/2021)   Patient History    Smoking Tobacco Use: Every Day    Smokeless Tobacco Use: Current    Passive Exposure: Not on file  Transportation Needs: No Transportation Needs (01/02/2022)   PRAPARE - Transportation    Lack of Transportation (Medical): No    Lack of Transportation (Non-Medical): No    Westley Hummer, MSW, [place]  [phone]- work cell phone (preferred) [phone]- desk phone

## 2022-01-06 ENCOUNTER — Telehealth: Payer: Self-pay | Admitting: Licensed Clinical Social Worker

## 2022-01-06 NOTE — Telephone Encounter (Signed)
H&V Care Navigation CSW Progress Note  Clinical Social Worker contacted patient by phone to f/u on PDF of Patient Care Fund agreement. No answer to my text message yet this afternoon. I will f/u again next week if no answer.  Patient is participating in a Managed Medicaid Plan:  No, commercial insurance only ([employer])  SDOH Screenings   Alcohol Screen: Not on file  Depression (PHQ2-9): Low Risk  (07/18/2021)   Depression (PHQ2-9)    PHQ-2 Score: 4  Financial Resource Strain: High Risk (01/02/2022)   Overall Financial Resource Strain (CARDIA)    Difficulty of Paying Living Expenses: Hard  Food Insecurity: Food Insecurity Present (01/02/2022)   Hunger Vital Sign    Worried About Running Out of Food in the Last Year: Sometimes true    Ran Out of Food in the Last Year: Sometimes true  Housing: Medium Risk (01/02/2022)   Housing    Last Housing Risk Score: 1  Physical Activity: Not on file  Social Connections: Not on file  Stress: Stress Concern Present (01/02/2022)   [employer] [facility] [place]    Feeling of Stress : Rather much  Tobacco Use: High Risk (11/25/2021)   Patient History    Smoking Tobacco Use: Every Day    Smokeless Tobacco Use: Current    Passive Exposure: Not on file  Transportation Needs: No Transportation Needs (01/02/2022)   PRAPARE - Transportation    Lack of Transportation (Medical): No    Lack of Transportation (Non-Medical): No   Westley Hummer, MSW, [place]  [phone]- work cell phone (preferred) [phone]- desk phone

## 2022-01-10 ENCOUNTER — Telehealth: Payer: Self-pay | Admitting: Licensed Clinical Social Worker

## 2022-01-10 ENCOUNTER — Encounter: Payer: Self-pay | Admitting: Internal Medicine

## 2022-01-10 NOTE — Progress Notes (Unsigned)
Subjective:    Patient ID: Marissa Joseph, female    DOB: [birth year], 41 y.o.   MRN: [id number]     HPI Tahari is here for follow up of her chronic medical problems, including htn, prediabetes, b/l leg edema, allergies, asthma  She is taking the gabapentin for nerve pain in legs and helps with shoulder pain.   Out of BP medication x 1 month - did not have enough funds to get her prescription.  Depressed - works a lot and hard and not getting paid a lot.     Medications and allergies reviewed with patient and updated if appropriate.  Current Outpatient Medications on File Prior to Visit  Medication Sig Dispense Refill   albuterol (VENTOLIN HFA) 108 (90 Base) MCG/ACT inhaler Inhale 1-2 puffs into the lungs every 6 (six) hours as needed for wheezing. 18 g 6   bisacodyl (DULCOLAX) 5 MG EC tablet Take 1 tablet (5 mg total) by mouth daily as needed for moderate constipation. 30 tablet 0   budesonide-formoterol (SYMBICORT) 80-4.5 MCG/ACT inhaler Inhale 2 puffs into the lungs 2 (two) times daily. 1 Inhaler 3   celecoxib (CELEBREX) 200 MG capsule Take 200 mg by mouth 2 (two) times daily.     diclofenac Sodium (VOLTAREN) 1 % GEL Apply 4 g topically 4 (four) times daily as needed. 150 g 0   furosemide (LASIX) 20 MG tablet Take 1 tablet (20 mg total) by mouth daily as needed. 90 tablet 1   gabapentin (NEURONTIN) 100 MG capsule Take 2 capsules (200 mg total) by mouth at bedtime. 60 capsule 3   montelukast (SINGULAIR) 10 MG tablet Take 1 tablet (10 mg total) by mouth at bedtime. 90 tablet 1   olmesartan-hydrochlorothiazide (BENICAR HCT) 20-12.5 MG tablet Take 1 tablet by mouth once daily 90 tablet 1   polyethylene glycol powder (GLYCOLAX/MIRALAX) 17 GM/SCOOP powder Take 17 g by mouth 2 (two) times daily as needed for severe constipation. 116 g 1   traMADol (ULTRAM) 50 MG tablet Take 1 tablet (50 mg total) by mouth every 12 (twelve) hours as needed. 30 tablet 2   No current  facility-administered medications on file prior to visit.     Review of Systems  Constitutional:  Negative for chills and fever.  Respiratory:  Positive for shortness of breath (with moderate exertion). Negative for cough and wheezing.   Cardiovascular:  Positive for leg swelling (taking lasix prn). Negative for chest pain and palpitations.  Neurological:  Positive for headaches (occ). Negative for light-headedness.  Psychiatric/Behavioral:  Positive for dysphoric mood. The patient is not nervous/anxious.        Objective:   Vitals:   01/11/22 1344  BP: (!) 142/80  Pulse: 83  Temp: 98.4 F (36.9 C)  SpO2: 97%   BP Readings from Last 3 Encounters:  01/11/22 (!) 142/80  12/29/21 (!) 142/100  11/25/21 130/90   Wt Readings from Last 3 Encounters:  01/11/22 (!) 322 lb (146.1 kg)  12/29/21 (!) 328 lb 3.2 oz (148.9 kg)  12/27/21 (!) 328 lb 3.2 oz (148.9 kg)   Body mass index is 55.27 kg/m.    Physical Exam Constitutional:      General: She is not in acute distress.    Appearance: Normal appearance.  HENT:     Head: Normocephalic and atraumatic.  Eyes:     Conjunctiva/sclera: Conjunctivae normal.  Cardiovascular:     Rate and Rhythm: Normal rate and regular rhythm.  Heart sounds: Normal heart sounds. No murmur heard. Pulmonary:     Effort: Pulmonary effort is normal. No respiratory distress.     Breath sounds: Normal breath sounds. No wheezing.  Musculoskeletal:     Cervical back: Neck supple.     Right lower leg: Edema (1 + nonpitting) present.     Left lower leg: Edema (1 + nonpitting) present.  Lymphadenopathy:     Cervical: No cervical adenopathy.  Skin:    General: Skin is warm and dry.     Findings: No rash.  Neurological:     Mental Status: She is alert. Mental status is at baseline.  Psychiatric:        Mood and Affect: Mood normal.        Behavior: Behavior normal.        Lab Results  Component Value Date   WBC 5.7 06/09/2021   HGB 13.1  06/09/2021   HCT 39.9 06/09/2021   PLT 217.0 06/09/2021   GLUCOSE 79 06/09/2021   CHOL 104 05/14/2019   TRIG 53.0 05/14/2019   HDL 36.00 (L) 05/14/2019   LDLCALC 58 05/14/2019   ALT 13 06/09/2021   AST 17 06/09/2021   NA 139 06/09/2021   K 4.0 06/09/2021   CL 104 06/09/2021   CREATININE 0.90 06/09/2021   BUN 20 06/09/2021   CO2 29 06/09/2021   TSH 1.28 06/09/2021   HGBA1C 5.3 02/14/2021     Assessment & Plan:    See Problem List for Assessment and Plan of chronic medical problems.

## 2022-01-10 NOTE — Telephone Encounter (Signed)
H&V Care Navigation CSW Progress Note  Clinical Social Worker contacted patient by phone to confirm submission of all needed documents for rental assistance to Patient [place] [phone]).   Patient is participating in a Managed Medicaid Plan:  No, commercial insurance ([employer])  SDOH Screenings   Alcohol Screen: Not on file  Depression (PHQ2-9): Low Risk  (07/18/2021)   Depression (PHQ2-9)    PHQ-2 Score: 4  Financial Resource Strain: High Risk (01/02/2022)   Overall Financial Resource Strain (CARDIA)    Difficulty of Paying Living Expenses: Hard  Food Insecurity: Food Insecurity Present (01/02/2022)   Hunger Vital Sign    Worried About Running Out of Food in the Last Year: Sometimes true    Ran Out of Food in the Last Year: Sometimes true  Housing: Medium Risk (01/02/2022)   Housing    Last Housing Risk Score: 1  Physical Activity: Not on file  Social Connections: Not on file  Stress: Stress Concern Present (01/02/2022)   [employer] [facility] [place]    Feeling of Stress : Rather much  Tobacco Use: High Risk (11/25/2021)   Patient History    Smoking Tobacco Use: Every Day    Smokeless Tobacco Use: Current    Passive Exposure: Not on file  Transportation Needs: No Transportation Needs (01/02/2022)   PRAPARE - Transportation    Lack of Transportation (Medical): No    Lack of Transportation (Non-Medical): No   Westley Hummer, MSW, [place]  [phone]- work cell phone (preferred) [phone]- desk phone

## 2022-01-11 ENCOUNTER — Ambulatory Visit (INDEPENDENT_AMBULATORY_CARE_PROVIDER_SITE_OTHER): Payer: PRIVATE HEALTH INSURANCE | Admitting: Internal Medicine

## 2022-01-11 ENCOUNTER — Ambulatory Visit: Payer: PRIVATE HEALTH INSURANCE | Admitting: Internal Medicine

## 2022-01-11 VITALS — BP 142/80 | HR 83 | Temp 98.4°F | Ht 64.0 in | Wt 322.0 lb

## 2022-01-11 DIAGNOSIS — J4520 Mild intermittent asthma, uncomplicated: Secondary | ICD-10-CM

## 2022-01-11 DIAGNOSIS — Z6841 Body Mass Index (BMI) 40.0 and over, adult: Secondary | ICD-10-CM

## 2022-01-11 DIAGNOSIS — R600 Localized edema: Secondary | ICD-10-CM | POA: Diagnosis not present

## 2022-01-11 DIAGNOSIS — R7303 Prediabetes: Secondary | ICD-10-CM | POA: Diagnosis not present

## 2022-01-11 DIAGNOSIS — I10 Essential (primary) hypertension: Secondary | ICD-10-CM

## 2022-01-11 DIAGNOSIS — G894 Chronic pain syndrome: Secondary | ICD-10-CM

## 2022-01-11 DIAGNOSIS — J309 Allergic rhinitis, unspecified: Secondary | ICD-10-CM

## 2022-01-11 MED ORDER — TIRZEPATIDE 2.5 MG/0.5ML ~~LOC~~ SOAJ
2.5000 mg | SUBCUTANEOUS | 0 refills | Status: DC
  Filled 2022-01-24 – 2022-02-10 (×4): qty 2, 28d supply, fill #0

## 2022-01-11 NOTE — Assessment & Plan Note (Signed)
Chronic Controlled, stable Continue lasix 20 mg daily prn  

## 2022-01-11 NOTE — Assessment & Plan Note (Signed)
Chronic Mild, intermittent Controlled Continue inhalers and Singulair nightly

## 2022-01-11 NOTE — Patient Instructions (Addendum)
     Blood work was ordered.     Medications changes include :   mounajro 2.5 mg weekly   Your prescription(s) have been sent to your pharmacy.       Return in about 6 months (around 07/14/2022) for follow up.

## 2022-01-11 NOTE — Assessment & Plan Note (Signed)
Chronic Had a long discussion with her today again about the need to lose weight She is trying-she is going to the gym and has cut back on her eating and has lost a few pounds Her weight  is significantly affecting her mobility and I fear this will continue to worsen unless she loses the weight soon Discussed options-I think she more than anyone needs help with weight loss She has prediabetes, chronic back pain, depression, multiple joint pain Start Mounjaro 2.5 mg weekly subcutaneously-discussed side effects.  Stressed that this is to help in the short-term and that she needs to continue to work on lifestyle changes in order to make the weight loss prominent

## 2022-01-11 NOTE — Assessment & Plan Note (Signed)
Chronic Leg pain that she feels is an achy, nerve pain Also multiple joints hurt She is getting benefit from gabapentin Continue gabapentin 200 mg nightly

## 2022-01-11 NOTE — Assessment & Plan Note (Addendum)
Chronic Blood pressure elevated here today She has not taken her medication for a month because she cannot pay the co-pay to get her prescription Discussed good Rx-using that instead of her insurance at [facility] she can get her prescription for $2.50-stressed the need for her to continue that medication to prevent organ damage. CMP

## 2022-01-11 NOTE — Assessment & Plan Note (Signed)
Chronic Check a1c Low sugar / carb diet Stressed regular exercise  

## 2022-01-24 ENCOUNTER — Encounter (HOSPITAL_BASED_OUTPATIENT_CLINIC_OR_DEPARTMENT_OTHER): Payer: Self-pay | Admitting: Pharmacist

## 2022-01-24 ENCOUNTER — Other Ambulatory Visit (HOSPITAL_BASED_OUTPATIENT_CLINIC_OR_DEPARTMENT_OTHER): Payer: Self-pay

## 2022-01-24 ENCOUNTER — Telehealth: Payer: Self-pay | Admitting: Licensed Clinical Social Worker

## 2022-01-24 NOTE — Telephone Encounter (Signed)
H&V Care Navigation CSW Progress Note  Clinical Social Worker contacted patient by phone (pt texted this morning) to f/u on how to apply for SNAP- pt shares she thinks she had previously filed on [employer] system. I shared she can apply online but if she would like additional assistance that she can utilize our partners with [employer] and send her a text message with a photo of the assistance card with a phone number and e-mail she can connect with. Pt also made aware that all needed documents have been sent to patient care fund and that I have dropped the rental assistance off at the leasing office at [address] [place]. Pt very appreciative for all updates. I remain available.   Patient is participating in a Managed Medicaid Plan:  No, commercial plan only.   SDOH Screenings   Alcohol Screen: Not on file  Depression (PHQ2-9): Medium Risk (01/11/2022)   Depression (PHQ2-9)    PHQ-2 Score: 6  Financial Resource Strain: High Risk (01/02/2022)   Overall Financial Resource Strain (CARDIA)    Difficulty of Paying Living Expenses: Hard  Food Insecurity: Food Insecurity Present (01/02/2022)   Hunger Vital Sign    Worried About Running Out of Food in the Last Year: Sometimes true    Ran Out of Food in the Last Year: Sometimes true  Housing: Medium Risk (01/02/2022)   Housing    Last Housing Risk Score: 1  Physical Activity: Not on file  Social Connections: Not on file  Stress: Stress Concern Present (01/02/2022)   [employer] [facility] [place]    Feeling of Stress : Rather much  Tobacco Use: High Risk (01/10/2022)   Patient History    Smoking Tobacco Use: Every Day    Smokeless Tobacco Use: Current    Passive Exposure: Not on file  Transportation Needs: No Transportation Needs (01/02/2022)   PRAPARE - Transportation    Lack of Transportation (Medical): No    Lack of Transportation (Non-Medical): No    Westley Hummer, MSW,  [place]  [phone]- work cell phone (preferred) [phone]- desk phone

## 2022-01-31 ENCOUNTER — Other Ambulatory Visit (HOSPITAL_BASED_OUTPATIENT_CLINIC_OR_DEPARTMENT_OTHER): Payer: Self-pay

## 2022-02-07 ENCOUNTER — Encounter (HOSPITAL_BASED_OUTPATIENT_CLINIC_OR_DEPARTMENT_OTHER): Payer: Self-pay

## 2022-02-07 ENCOUNTER — Other Ambulatory Visit (HOSPITAL_BASED_OUTPATIENT_CLINIC_OR_DEPARTMENT_OTHER): Payer: Self-pay

## 2022-02-10 ENCOUNTER — Telehealth: Payer: Self-pay | Admitting: Licensed Clinical Social Worker

## 2022-02-10 ENCOUNTER — Other Ambulatory Visit (HOSPITAL_BASED_OUTPATIENT_CLINIC_OR_DEPARTMENT_OTHER): Payer: Self-pay

## 2022-02-10 NOTE — Telephone Encounter (Signed)
H&V Care Navigation CSW Progress Note  Clinical Social Worker contacted patient by phone to provide check in. Pt had been connected with various resources and provided assistance through Patient [place]. Pt drives for work therefore sent her a text message as requested to provide check in. Remain available should pt respond with any additional questions/concerns.   Patient is participating in a Managed Medicaid Plan:  No, commercial insurance only.   SDOH Screenings   Food Insecurity: Food Insecurity Present (01/02/2022)  Housing: Medium Risk (01/02/2022)  Transportation Needs: No Transportation Needs (01/02/2022)  Depression (PHQ2-9): Medium Risk (01/11/2022)  Financial Resource Strain: High Risk (01/02/2022)  Stress: Stress Concern Present (01/02/2022)  Tobacco Use: High Risk (01/10/2022)    Westley Hummer, MSW, [place]  [phone]- work cell phone (preferred) [phone]- desk phone

## 2022-02-20 NOTE — Telephone Encounter (Signed)
H&V Care Navigation CSW Progress Note  Clinical Social Worker  received message back from pt . She shares she is staying above water, will work on [employer] as soon as she can since she has been working all day. Will reach out to ask for help as needed moving forward- is aware our team remains available to assist as able.   Patient is participating in a Managed Medicaid Plan:  No, commercial insurance only.   SDOH Screenings   Food Insecurity: Food Insecurity Present (01/02/2022)  Housing: Medium Risk (01/02/2022)  Transportation Needs: No Transportation Needs (01/02/2022)  Depression (PHQ2-9): Medium Risk (01/11/2022)  Financial Resource Strain: High Risk (01/02/2022)  Stress: Stress Concern Present (01/02/2022)  Tobacco Use: High Risk (01/10/2022)   Westley Hummer, MSW, [place]  [phone]- work cell phone (preferred) [phone]- desk phone

## 2022-03-04 DIAGNOSIS — M7918 Myalgia, other site: Secondary | ICD-10-CM | POA: Insufficient documentation

## 2022-03-04 DIAGNOSIS — Y92410 Unspecified street and highway as the place of occurrence of the external cause: Secondary | ICD-10-CM | POA: Insufficient documentation

## 2022-03-04 DIAGNOSIS — Z5321 Procedure and treatment not carried out due to patient leaving prior to being seen by health care provider: Secondary | ICD-10-CM | POA: Diagnosis not present

## 2022-03-04 DIAGNOSIS — S161XXA Strain of muscle, fascia and tendon at neck level, initial encounter: Secondary | ICD-10-CM | POA: Diagnosis not present

## 2022-03-05 ENCOUNTER — Emergency Department (HOSPITAL_BASED_OUTPATIENT_CLINIC_OR_DEPARTMENT_OTHER): Payer: PRIVATE HEALTH INSURANCE | Admitting: Radiology

## 2022-03-05 ENCOUNTER — Encounter (HOSPITAL_BASED_OUTPATIENT_CLINIC_OR_DEPARTMENT_OTHER): Payer: Self-pay | Admitting: Emergency Medicine

## 2022-03-05 ENCOUNTER — Encounter (HOSPITAL_BASED_OUTPATIENT_CLINIC_OR_DEPARTMENT_OTHER): Payer: Self-pay | Admitting: *Deleted

## 2022-03-05 ENCOUNTER — Other Ambulatory Visit: Payer: Self-pay

## 2022-03-05 ENCOUNTER — Emergency Department (HOSPITAL_BASED_OUTPATIENT_CLINIC_OR_DEPARTMENT_OTHER)
Admission: EM | Admit: 2022-03-05 | Discharge: 2022-03-05 | Disposition: A | Discharge status: Home / Self Care | Payer: PRIVATE HEALTH INSURANCE | Source: Home / Self Care | Attending: Emergency Medicine | Admitting: Emergency Medicine

## 2022-03-05 ENCOUNTER — Emergency Department (HOSPITAL_BASED_OUTPATIENT_CLINIC_OR_DEPARTMENT_OTHER)
Admission: EM | Admit: 2022-03-05 | Discharge: 2022-03-05 | Disposition: A | Discharge status: Home / Self Care | Payer: PRIVATE HEALTH INSURANCE | Attending: Emergency Medicine | Admitting: Emergency Medicine

## 2022-03-05 DIAGNOSIS — Y92410 Unspecified street and highway as the place of occurrence of the external cause: Secondary | ICD-10-CM | POA: Insufficient documentation

## 2022-03-05 DIAGNOSIS — S161XXA Strain of muscle, fascia and tendon at neck level, initial encounter: Secondary | ICD-10-CM | POA: Insufficient documentation

## 2022-03-05 DIAGNOSIS — M791 Myalgia, unspecified site: Secondary | ICD-10-CM | POA: Insufficient documentation

## 2022-03-05 DIAGNOSIS — M79604 Pain in right leg: Secondary | ICD-10-CM

## 2022-03-05 LAB — PREGNANCY, URINE: Preg Test, Ur: NEGATIVE

## 2022-03-05 MED ORDER — KETOROLAC TROMETHAMINE 60 MG/2ML IM SOLN
60.0000 mg | Freq: Once | INTRAMUSCULAR | Status: AC
  Administered 2022-03-05: 60 mg via INTRAMUSCULAR
  Filled 2022-03-05: qty 2

## 2022-03-05 MED ORDER — NAPROXEN 500 MG PO TABS: 500.0000 mg | ORAL_TABLET | Freq: Two times a day (BID) | ORAL | 0 refills | Status: DC

## 2022-03-05 MED ORDER — LIDOCAINE 5 % EX PTCH: 1.0000 | MEDICATED_PATCH | CUTANEOUS | 0 refills | Status: DC
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
## 2022-03-05 NOTE — ED Notes (Signed)
Pt alerted registration she was leaving. Alert, oriented, and ambulatory.

## 2022-03-05 NOTE — ED Notes (Signed)
Visitor at bedside. Patient to Xray at this time.

## 2022-03-06 ENCOUNTER — Telehealth: Payer: Self-pay

## 2022-03-06 ENCOUNTER — Encounter: Payer: Self-pay | Admitting: Internal Medicine

## 2022-03-06 NOTE — Telephone Encounter (Signed)
Transition Care Management Follow-up Telephone Call Date of discharge and from where: March 05, 2022. [facility] How have you been since you were released from the hospital? No Any questions or concerns? No  Items Reviewed: Did the pt receive and understand the discharge instructions provided? Yes  Medications obtained and verified? Yes  Other? No  Any new allergies since your discharge? No  Dietary orders reviewed? No Do you have support at home? Yes   Home Care and Equipment/Supplies: Were home health services ordered? no If so, what is the name of the agency? N/A  Has the agency set up a time to come to the patient's home? no Were any new equipment or medical supplies ordered?  No What is the name of the medical supply agency? N/A Were you able to get the supplies/equipment? not applicable Do you have any questions related to the use of the equipment or supplies? No  Functional Questionnaire: (I = Independent and D = Dependent) ADLs: I  Bathing/Dressing- I  Meal Prep- I  Eating- I  Maintaining continence- I  Transferring/Ambulation- I  Managing Meds- I  Follow up appointments reviewed:  PCP Hospital f/u appt confirmed? Yes  Scheduled to see Dr. Quay Burow on Wednesday @ 2:40 pm.. [facility] f/u appt confirmed?  N/A   Are transportation arrangements needed? No  If their condition worsens, is the pt aware to call PCP or go to the Emergency Dept.? Yes Was the patient provided with contact information for the PCP's office or ED? Yes Was to pt encouraged to call back with questions or concerns? Yes

## 2022-03-07 ENCOUNTER — Encounter: Payer: Self-pay | Admitting: Internal Medicine
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
## 2022-03-08 ENCOUNTER — Ambulatory Visit (INDEPENDENT_AMBULATORY_CARE_PROVIDER_SITE_OTHER): Payer: PRIVATE HEALTH INSURANCE | Admitting: Internal Medicine

## 2022-03-08 VITALS — BP 136/80 | HR 83 | Temp 98.2°F | Ht 64.0 in | Wt 331.0 lb

## 2022-03-08 DIAGNOSIS — S46812A Strain of other muscles, fascia and tendons at shoulder and upper arm level, left arm, initial encounter: Secondary | ICD-10-CM | POA: Insufficient documentation

## 2022-03-08 DIAGNOSIS — M79604 Pain in right leg: Secondary | ICD-10-CM

## 2022-03-08 MED ORDER — METHOCARBAMOL 500 MG PO TABS: 500.0000 mg | ORAL_TABLET | Freq: Four times a day (QID) | ORAL | 0 refills | Status: DC | PRN
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
## 2022-03-08 NOTE — Patient Instructions (Addendum)
     Your neck pain is likely a muscle strain.  Continue the ibuprofen, heat.  You can apply topical muscle medication and gently stretch the neck.     Medications changes include :  methocarbamol 500 mg 4 times a day as needed    Your prescription(s) have been sent to your pharmacy.    See the orthopedic on Friday - he will evaluate your leg pain.
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
## 2022-04-12 ENCOUNTER — Ambulatory Visit (INDEPENDENT_AMBULATORY_CARE_PROVIDER_SITE_OTHER): Payer: PRIVATE HEALTH INSURANCE | Admitting: Internal Medicine

## 2022-04-12 ENCOUNTER — Other Ambulatory Visit (HOSPITAL_BASED_OUTPATIENT_CLINIC_OR_DEPARTMENT_OTHER): Payer: Self-pay

## 2022-04-12 ENCOUNTER — Encounter: Payer: Self-pay | Admitting: Internal Medicine

## 2022-04-12 VITALS — BP 128/88 | HR 97 | Temp 98.6°F | Ht 64.0 in | Wt 330.6 lb

## 2022-04-12 DIAGNOSIS — Z6841 Body Mass Index (BMI) 40.0 and over, adult: Secondary | ICD-10-CM | POA: Diagnosis not present

## 2022-04-12 DIAGNOSIS — L03116 Cellulitis of left lower limb: Secondary | ICD-10-CM

## 2022-04-12 MED ORDER — SULFAMETHOXAZOLE-TRIMETHOPRIM 800-160 MG PO TABS
1.0000 | ORAL_TABLET | Freq: Two times a day (BID) | ORAL | 0 refills | Status: AC
  Filled 2022-04-12: qty 14, 7d supply, fill #0

## 2022-04-12 NOTE — Assessment & Plan Note (Signed)
Chronic She is not currently exercising, but plans on getting back into it once she is able Stressed weight loss-she would like to avoid medication and would like to do it naturally She has lost a lot of weight in the past so she knows she can do it Stressed concentrating on her food that she is eating, calorie intake

## 2022-04-12 NOTE — Assessment & Plan Note (Addendum)
Acute Symptoms concerning for cellulitis - has had this several times in the past Wound on the lower leg has closed, but was opened couple of days ago which could be the source of infection Start Bactrim DS twice daily x7 days She will monitor closely Elevate leg whenever possible Stressed to getting up and walking throughout the day to help her leg swelling

## 2022-04-12 NOTE — Patient Instructions (Addendum)
      Medications changes include :   Bactrim twice daily x 7 days      Return if symptoms worsen or fail to improve.

## 2022-04-12 NOTE — Progress Notes (Signed)
Subjective:    Patient ID: Marissa Joseph, female    DOB: 09/09/1980, 41 y.o.   MRN: [id number]      HPI Alonzo is here for No chief complaint on file.    ?  Left lower leg cellulitis-  left leg swelling a lot - more than usual x few days.  End of the day has been worse.  Last night it was more inflammed - it was warm and she had discomfort in it.  She was concerned b/c of her history of cellulitis.  Scabbed wound on medial ankle on left foot - healed ulcer - did open the other day from swelling and closed back up.  No fever/ chills.      Medications and allergies reviewed with patient and updated if appropriate.  Current Outpatient Medications on File Prior to Visit  Medication Sig Dispense Refill   albuterol (VENTOLIN HFA) 108 (90 Base) MCG/ACT inhaler Inhale 1-2 puffs into the lungs every 6 (six) hours as needed for wheezing. 18 g 6   bisacodyl (DULCOLAX) 5 MG EC tablet Take 1 tablet (5 mg total) by mouth daily as needed for moderate constipation. 30 tablet 0   budesonide-formoterol (SYMBICORT) 80-4.5 MCG/ACT inhaler Inhale 2 puffs into the lungs 2 (two) times daily. 1 Inhaler 3   diclofenac Sodium (VOLTAREN) 1 % GEL Apply 4 g topically 4 (four) times daily as needed. 150 g 0   furosemide (LASIX) 20 MG tablet Take 1 tablet (20 mg total) by mouth daily as needed. 90 tablet 1   gabapentin (NEURONTIN) 100 MG capsule Take 2 capsules (200 mg total) by mouth at bedtime. 60 capsule 3   lidocaine (LIDODERM) 5 % Place 1 patch onto the skin daily. Remove & Discard patch within 12 hours or as directed by MD 30 patch 0   meloxicam (MOBIC) 15 MG tablet 1 tablet Orally Once a day for 30 day(s)     methocarbamol (ROBAXIN) 500 MG tablet Take 1 tablet (500 mg total) by mouth every 6 (six) hours as needed for muscle spasms. 30 tablet 0   montelukast (SINGULAIR) 10 MG tablet Take 1 tablet (10 mg total) by mouth at bedtime. 90 tablet 1   olmesartan-hydrochlorothiazide (BENICAR HCT) 20-12.5  MG tablet Take 1 tablet by mouth once daily 90 tablet 1   polyethylene glycol powder (GLYCOLAX/MIRALAX) 17 GM/SCOOP powder Take 17 g by mouth 2 (two) times daily as needed for severe constipation. 116 g 1   traMADol (ULTRAM) 50 MG tablet Take 1 tablet (50 mg total) by mouth every 12 (twelve) hours as needed. 30 tablet 2   No current facility-administered medications on file prior to visit.    Review of Systems  Constitutional:  Negative for chills and fever.  Cardiovascular:  Positive for leg swelling.  Skin:  Positive for color change and wound.       Objective:   Vitals:   04/12/22 1120  BP: 128/88  Pulse: 97  Temp: 98.6 F (37 C)  SpO2: 97%   BP Readings from Last 3 Encounters:  04/12/22 128/88  03/08/22 136/80  03/05/22 (!) 123/98   Wt Readings from Last 3 Encounters:  04/12/22 (!) 330 lb 9.6 oz (150 kg)  03/08/22 (!) 331 lb (150.1 kg)  03/05/22 (!) 326 lb (147.9 kg)   Body mass index is 56.75 kg/m.    Physical Exam Constitutional:      General: She is not in acute distress.    Appearance: Normal appearance. She  is not ill-appearing.  HENT:     Head: Normocephalic and atraumatic.  Musculoskeletal:     Right lower leg: Edema (minimal) present.     Left lower leg: Edema (mild - > RLE - slight redness, warmth and tenderness in lower leg.) present.  Skin:    Findings: Lesion (scabbed, close ulcer medial left ankle - nontender, no discharge) present.  Neurological:     Mental Status: She is alert.            Assessment & Plan:    See Problem List for Assessment and Plan of chronic medical problems.

## 2022-07-13 ENCOUNTER — Encounter: Payer: Self-pay | Admitting: Internal Medicine

## 2022-07-13 NOTE — Progress Notes (Addendum)
Subjective:    Patient ID: Marissa Joseph, female    DOB: [birth year], 42 y.o.   MRN: [id number]     HPI Marissa Joseph is here for follow up of her chronic medical problems, including htn, prediabetes, b/l leg edema, allergies, asthma Back pain  About 10 days may have stepped on glass - bottom of left foot - uncomfortable to walk.  She was picking at it and tried to get something out.  She also wants to make sure there is no infection.  Stretching daily.  Going to gym- doing bike and weight lifting.  She is concerned about her weight.  She needs to lose weight in order to have a knee replacement.  Medications and allergies reviewed with patient and updated if appropriate.  Current Outpatient Medications on File Prior to Visit  Medication Sig Dispense Refill   albuterol (VENTOLIN HFA) 108 (90 Base) MCG/ACT inhaler Inhale 1-2 puffs into the lungs every 6 (six) hours as needed for wheezing. 18 g 6   bisacodyl (DULCOLAX) 5 MG EC tablet Take 1 tablet (5 mg total) by mouth daily as needed for moderate constipation. 30 tablet 0   budesonide-formoterol (SYMBICORT) 80-4.5 MCG/ACT inhaler Inhale 2 puffs into the lungs 2 (two) times daily. 1 Inhaler 3   diclofenac Sodium (VOLTAREN) 1 % GEL Apply 4 g topically 4 (four) times daily as needed. 150 g 0   furosemide (LASIX) 20 MG tablet Take 1 tablet (20 mg total) by mouth daily as needed. 90 tablet 1   gabapentin (NEURONTIN) 100 MG capsule Take 2 capsules (200 mg total) by mouth at bedtime. 60 capsule 3   lidocaine (LIDODERM) 5 % Place 1 patch onto the skin daily. Remove & Discard patch within 12 hours or as directed by MD 30 patch 0   meloxicam (MOBIC) 15 MG tablet 1 tablet Orally Once a day for 30 day(s)     methocarbamol (ROBAXIN) 500 MG tablet Take 1 tablet (500 mg total) by mouth every 6 (six) hours as needed for muscle spasms. 30 tablet 0   montelukast (SINGULAIR) 10 MG tablet Take 1 tablet (10 mg total) by mouth at bedtime. 90 tablet 1    olmesartan-hydrochlorothiazide (BENICAR HCT) 20-12.5 MG tablet Take 1 tablet by mouth once daily 90 tablet 1   polyethylene glycol powder (GLYCOLAX/MIRALAX) 17 GM/SCOOP powder Take 17 g by mouth 2 (two) times daily as needed for severe constipation. 116 g 1   traMADol (ULTRAM) 50 MG tablet Take 1 tablet (50 mg total) by mouth every 12 (twelve) hours as needed. 30 tablet 2   No current facility-administered medications on file prior to visit.     Review of Systems  Constitutional:  Negative for fever.  Respiratory:  Positive for shortness of breath (intermittent). Negative for cough and wheezing.   Cardiovascular:  Positive for palpitations (occ) and leg swelling. Negative for chest pain.  Musculoskeletal:  Positive for back pain.  Neurological:  Negative for light-headedness and headaches.       Objective:   Vitals:   07/14/22 1002  BP: 128/84  Pulse: 76  Temp: 98.4 F (36.9 C)  SpO2: 98%   BP Readings from Last 3 Encounters:  07/14/22 128/84  04/12/22 128/88  03/08/22 136/80   Wt Readings from Last 3 Encounters:  07/14/22 (!) 347 lb (157.4 kg)  04/12/22 (!) 330 lb 9.6 oz (150 kg)  03/08/22 (!) 331 lb (150.1 kg)   Body mass index is 59.56 kg/m.  Physical Exam Constitutional:      General: She is not in acute distress.    Appearance: Normal appearance. She is not ill-appearing.  HENT:     Head: Normocephalic and atraumatic.     Right Ear: Tympanic membrane, ear canal and external ear normal.     Left Ear: Tympanic membrane, ear canal and external ear normal.     Mouth/Throat:     Mouth: Mucous membranes are moist.     Pharynx: No oropharyngeal exudate or posterior oropharyngeal erythema.  Eyes:     Conjunctiva/sclera: Conjunctivae normal.  Cardiovascular:     Rate and Rhythm: Normal rate and regular rhythm.  Pulmonary:     Effort: Pulmonary effort is normal. No respiratory distress.     Breath sounds: Normal breath sounds. No wheezing or rales.   Musculoskeletal:     Cervical back: Neck supple. No tenderness.  Lymphadenopathy:     Cervical: No cervical adenopathy.  Skin:    General: Skin is warm and dry.     Comments: Left foot-plantar surface-medial aspect in the central foot there is an area of thickened skin, slightly raised and tender to palpation.  There is a sliver opening without any discharge or bleeding.  No surrounding erythema  Neurological:     Mental Status: She is alert.     Procedure: Incision and removal of foreign body Indications: Foreign body-piece of glass in left foot-plantar surface   Procedure Details Consent:  Verbal consent for procedure obtained. Performed.  The area was cleaned with alcohol swabs.     The area was sprayed with a numbing spray-she preferred that over lidocaine because of the needle.  There was a slight sliver opening so suture scissors were used to make an incision.  Suture removal scissors and tweezers were used to debride some of the upper layers of skin which was removed with the scissors.  Sliver of glass which was in the dermis layer of skin was believed to be removed with the layers of skin.  There is no bleeding, no pus, no erythema.    Patient did tolerate procedure well, with improvement in the discomfort on the bottom of her foot.  She will monitor closely over the next couple of days and let me know if any discomfort is remaining       Lab Results  Component Value Date   WBC 5.7 06/09/2021   HGB 13.1 06/09/2021   HCT 39.9 06/09/2021   PLT 217.0 06/09/2021   GLUCOSE 79 06/09/2021   CHOL 104 05/14/2019   TRIG 53.0 05/14/2019   HDL 36.00 (L) 05/14/2019   LDLCALC 58 05/14/2019   ALT 13 06/09/2021   AST 17 06/09/2021   NA 139 06/09/2021   K 4.0 06/09/2021   CL 104 06/09/2021   CREATININE 0.90 06/09/2021   BUN 20 06/09/2021   CO2 29 06/09/2021   TSH 1.28 06/09/2021   HGBA1C 5.3 02/14/2021     Assessment & Plan:    See Problem List for Assessment and Plan  of chronic medical problems.

## 2022-07-13 NOTE — Patient Instructions (Addendum)
      Blood work was ordered.   The lab is on the first floor.    Medications changes include :   zepbound if covered      Return in about 6 months (around 01/12/2023) for Physical Exam.

## 2022-07-14 ENCOUNTER — Other Ambulatory Visit (HOSPITAL_BASED_OUTPATIENT_CLINIC_OR_DEPARTMENT_OTHER): Payer: Self-pay

## 2022-07-14 ENCOUNTER — Ambulatory Visit (INDEPENDENT_AMBULATORY_CARE_PROVIDER_SITE_OTHER): Payer: PRIVATE HEALTH INSURANCE | Admitting: Internal Medicine

## 2022-07-14 VITALS — BP 128/84 | HR 76 | Temp 98.4°F | Ht 64.0 in | Wt 347.0 lb

## 2022-07-14 DIAGNOSIS — R600 Localized edema: Secondary | ICD-10-CM

## 2022-07-14 DIAGNOSIS — J309 Allergic rhinitis, unspecified: Secondary | ICD-10-CM

## 2022-07-14 DIAGNOSIS — G8929 Other chronic pain: Secondary | ICD-10-CM

## 2022-07-14 DIAGNOSIS — J4520 Mild intermittent asthma, uncomplicated: Secondary | ICD-10-CM | POA: Diagnosis not present

## 2022-07-14 DIAGNOSIS — I10 Essential (primary) hypertension: Secondary | ICD-10-CM

## 2022-07-14 DIAGNOSIS — R7303 Prediabetes: Secondary | ICD-10-CM | POA: Diagnosis not present

## 2022-07-14 DIAGNOSIS — F4323 Adjustment disorder with mixed anxiety and depressed mood: Secondary | ICD-10-CM

## 2022-07-14 DIAGNOSIS — M545 Low back pain, unspecified: Secondary | ICD-10-CM

## 2022-07-14 DIAGNOSIS — Z6841 Body Mass Index (BMI) 40.0 and over, adult: Secondary | ICD-10-CM

## 2022-07-14 DIAGNOSIS — S90852A Superficial foreign body, left foot, initial encounter: Secondary | ICD-10-CM | POA: Insufficient documentation

## 2022-07-14 LAB — CBC WITH DIFFERENTIAL/PLATELET
Basophils Absolute: 0 10*3/uL (ref 0.0–0.1)
Basophils Relative: 0.9 % (ref 0.0–3.0)
Eosinophils Absolute: 0.1 10*3/uL (ref 0.0–0.7)
Eosinophils Relative: 1.5 % (ref 0.0–5.0)
HCT: 40.5 % (ref 36.0–46.0)
Hemoglobin: 13.5 g/dL (ref 12.0–15.0)
Lymphocytes Relative: 39.6 % (ref 12.0–46.0)
Lymphs Abs: 1.8 10*3/uL (ref 0.7–4.0)
MCHC: 33.3 g/dL (ref 30.0–36.0)
MCV: 93.3 fl (ref 78.0–100.0)
Monocytes Absolute: 0.4 10*3/uL (ref 0.1–1.0)
Monocytes Relative: 9.5 % (ref 3.0–12.0)
Neutro Abs: 2.2 10*3/uL (ref 1.4–7.7)
Neutrophils Relative %: 48.5 % (ref 43.0–77.0)
Platelets: 227 10*3/uL (ref 150.0–400.0)
RBC: 4.34 Mil/uL (ref 3.87–5.11)
RDW: 13.8 % (ref 11.5–15.5)
WBC: 4.6 10*3/uL (ref 4.0–10.5)

## 2022-07-14 LAB — COMPREHENSIVE METABOLIC PANEL
ALT: 13 U/L (ref 0–35)
AST: 14 U/L (ref 0–37)
Albumin: 3.9 g/dL (ref 3.5–5.2)
Alkaline Phosphatase: 64 U/L (ref 39–117)
BUN: 17 mg/dL (ref 6–23)
CO2: 33 mEq/L — ABNORMAL HIGH (ref 19–32)
Calcium: 9.3 mg/dL (ref 8.4–10.5)
Chloride: 100 mEq/L (ref 96–112)
Creatinine, Ser: 0.83 mg/dL (ref 0.40–1.20)
GFR: 87.68 mL/min (ref >60.00)
Glucose, Bld: 99 mg/dL (ref 70–99)
Potassium: 4.2 mEq/L (ref 3.5–5.1)
Sodium: 139 mEq/L (ref 135–145)
Total Bilirubin: 0.4 mg/dL (ref 0.2–1.2)
Total Protein: 7.5 g/dL (ref 6.0–8.3)

## 2022-07-14 LAB — HEMOGLOBIN A1C: Hgb A1c MFr Bld: 5.4 % (ref 4.6–6.5)

## 2022-07-14 MED ORDER — ZEPBOUND 2.5 MG/0.5ML ~~LOC~~ SOAJ
2.5000 mg | SUBCUTANEOUS | 0 refills | Status: DC
  Filled 2022-07-14: qty 2, 28d supply, fill #0

## 2022-07-14 MED ORDER — GABAPENTIN 100 MG PO CAPS
200.0000 mg | ORAL_CAPSULE | Freq: Every day | ORAL | 5 refills | Status: DC
  Filled 2022-10-12: qty 60, 30d supply, fill #0

## 2022-07-14 MED ORDER — FUROSEMIDE 20 MG PO TABS
20.0000 mg | ORAL_TABLET | Freq: Every day | ORAL | 1 refills | Status: DC
  Filled 2022-10-12: qty 30, 30d supply, fill #0

## 2022-07-14 MED ORDER — ALBUTEROL SULFATE HFA 108 (90 BASE) MCG/ACT IN AERS: 1.0000 | INHALATION_SPRAY | Freq: Four times a day (QID) | RESPIRATORY_TRACT | 6 refills | Status: DC | PRN

## 2022-07-14 NOTE — Assessment & Plan Note (Signed)
Chronic Check a1c Low sugar / carb diet Stressed regular exercise  
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
## 2022-07-14 NOTE — Assessment & Plan Note (Signed)
Chronic Stressed weight loss-she knows she needs to lose weight She does not want to consider gastric sleeve Insurance did not cover Darcel Bayley Will try to see if insurance will cover Zepbound with savings card-if not may need to consider Wegovy She states she is eating 1200 cal a day and she is exercising My guess is she is likely consuming more than she realizes

## 2022-07-14 NOTE — Assessment & Plan Note (Signed)
Chronic Blood pressure well controlled CMP Continue Benicar-HCT 20-12.5 mg daily

## 2022-07-14 NOTE — Assessment & Plan Note (Signed)
Chronic Controlled Continue Singulair 10 mg nightly

## 2022-07-14 NOTE — Assessment & Plan Note (Signed)
Chronic Mild, intermittent Controlled Continue Symbicort twice daily as needed, albuterol inhaler as needed and Singulair 10 mg nightly

## 2022-07-14 NOTE — Assessment & Plan Note (Addendum)
Chronic Controlled Continue Lasix 20 mg daily She is watching salt

## 2022-07-14 NOTE — Assessment & Plan Note (Addendum)
Chronic Intermittent Continue gabapentin 200 mg at bedtime as needed, methocarbamol 500 mg every 6 hours as needed

## 2022-07-14 NOTE — Assessment & Plan Note (Signed)
States some depression - related to work, money and her weight Has financial stress

## 2022-07-18 NOTE — Telephone Encounter (Signed)
H&V Care Navigation CSW Progress Note  Clinical Social Worker  mailed pt a copy of signed [facility] Agreement  to keep for her records. Remain available if needed.   Patient is participating in a Managed Medicaid Plan:  No, Commercial insurance only  [place]: Food Insecurity Present (01/02/2022)  Housing: Medium Risk (01/02/2022)  Transportation Needs: No Transportation Needs (01/02/2022)  Depression (PHQ2-9): Medium Risk (01/11/2022)  Financial Resource Strain: High Risk (01/02/2022)  Stress: Stress Concern Present (01/02/2022)  Tobacco Use: High Risk (07/13/2022)    Westley Hummer, MSW, [place]  [phone]- work cell phone (preferred) [phone]- desk phone

## 2022-08-08 ENCOUNTER — Other Ambulatory Visit: Payer: Self-pay

## 2022-08-08 ENCOUNTER — Emergency Department (HOSPITAL_BASED_OUTPATIENT_CLINIC_OR_DEPARTMENT_OTHER)
Admission: EM | Admit: 2022-08-08 | Discharge: 2022-08-09 | Disposition: A | Discharge status: Home / Self Care | Payer: PRIVATE HEALTH INSURANCE | Attending: Emergency Medicine | Admitting: Emergency Medicine

## 2022-08-08 ENCOUNTER — Emergency Department (HOSPITAL_BASED_OUTPATIENT_CLINIC_OR_DEPARTMENT_OTHER): Payer: PRIVATE HEALTH INSURANCE

## 2022-08-08 ENCOUNTER — Encounter (HOSPITAL_BASED_OUTPATIENT_CLINIC_OR_DEPARTMENT_OTHER): Payer: Self-pay | Admitting: Emergency Medicine

## 2022-08-08 ENCOUNTER — Emergency Department (HOSPITAL_BASED_OUTPATIENT_CLINIC_OR_DEPARTMENT_OTHER): Payer: PRIVATE HEALTH INSURANCE | Admitting: Radiology

## 2022-08-08 DIAGNOSIS — M79662 Pain in left lower leg: Secondary | ICD-10-CM | POA: Diagnosis present

## 2022-08-08 DIAGNOSIS — L03115 Cellulitis of right lower limb: Secondary | ICD-10-CM | POA: Insufficient documentation

## 2022-08-08 DIAGNOSIS — R000 Tachycardia, unspecified: Secondary | ICD-10-CM | POA: Insufficient documentation

## 2022-08-08 DIAGNOSIS — D72829 Elevated white blood cell count, unspecified: Secondary | ICD-10-CM | POA: Insufficient documentation

## 2022-08-08 LAB — COMPREHENSIVE METABOLIC PANEL
ALT: 13 U/L (ref 0–44)
AST: 17 U/L (ref 15–41)
Albumin: 4.2 g/dL (ref 3.5–5.0)
Alkaline Phosphatase: 67 U/L (ref 38–126)
Anion gap: 9 (ref 5–15)
BUN: 21 mg/dL — ABNORMAL HIGH (ref 6–20)
CO2: 28 mmol/L (ref 22–32)
Calcium: 9.9 mg/dL (ref 8.9–10.3)
Chloride: 100 mmol/L (ref 98–111)
Creatinine, Ser: 0.84 mg/dL (ref 0.44–1.00)
GFR, Estimated: >60 mL/min (ref >60)
Glucose, Bld: 96 mg/dL (ref 70–99)
Potassium: 3.7 mmol/L (ref 3.5–5.1)
Sodium: 137 mmol/L (ref 135–145)
Total Bilirubin: 0.4 mg/dL (ref 0.3–1.2)
Total Protein: 8.6 g/dL — ABNORMAL HIGH (ref 6.5–8.1)

## 2022-08-08 LAB — CBC WITH DIFFERENTIAL/PLATELET
Abs Immature Granulocytes: 0.05 10*3/uL (ref 0.00–0.07)
Basophils Absolute: 0 10*3/uL (ref 0.0–0.1)
Basophils Relative: 0 %
Eosinophils Absolute: 0.1 10*3/uL (ref 0.0–0.5)
Eosinophils Relative: 1 %
HCT: 41.9 % (ref 36.0–46.0)
Hemoglobin: 13.7 g/dL (ref 12.0–15.0)
Immature Granulocytes: 0 %
Lymphocytes Relative: 11 %
Lymphs Abs: 1.7 10*3/uL (ref 0.7–4.0)
MCH: 30.6 pg (ref 26.0–34.0)
MCHC: 32.7 g/dL (ref 30.0–36.0)
MCV: 93.5 fL (ref 80.0–100.0)
Monocytes Absolute: 0.7 10*3/uL (ref 0.1–1.0)
Monocytes Relative: 4 %
Neutro Abs: 13.3 10*3/uL — ABNORMAL HIGH (ref 1.7–7.7)
Neutrophils Relative %: 84 %
Platelets: 238 10*3/uL (ref 150–400)
RBC: 4.48 MIL/uL (ref 3.87–5.11)
RDW: 13.2 % (ref 11.5–15.5)
WBC: 15.9 10*3/uL — ABNORMAL HIGH (ref 4.0–10.5)
nRBC: 0 % (ref 0.0–0.2)

## 2022-08-08 MED ORDER — ACETAMINOPHEN 325 MG PO TABS
650.0000 mg | ORAL_TABLET | Freq: Once | ORAL | Status: AC
  Administered 2022-08-08: 650 mg via ORAL
  Filled 2022-08-08: qty 2

## 2022-08-08 MED ORDER — CEPHALEXIN 500 MG PO CAPS: 500.0000 mg | ORAL_CAPSULE | Freq: Four times a day (QID) | ORAL | 0 refills | Status: AC

## 2022-08-08 NOTE — ED Notes (Signed)
Patient getting Ultrasound at the bedside.

## 2022-08-08 NOTE — ED Triage Notes (Signed)
Pt here for Lower L leg pain, pt states she has cellulitis. Pt reports waking up w/ throbbing pain, hot to touch, pt used ice w/o relief. Pt reports having no energy and feeling weak.

## 2022-08-08 NOTE — Discharge Instructions (Addendum)
You are seen today for cellulitis of your left leg.  You are being given antibiotics.  Follow-up closely with your primary care doctor.  If you develop fevers, worsening redness or pain, nausea or vomiting or any other worrisome changes come back to the ER right away.  If your symptoms worsen you may need IV antibiotics.

## 2022-08-08 NOTE — ED Provider Notes (Signed)
[facility] Provider Note   CSN: [id number] Arrival date & time: 08/08/22  1753     History {Add pertinent medical, surgical, social history, OB history to HPI:1} Chief Complaint  Patient presents with  . Leg Pain    Marissa Joseph is a 42 y.o. female.  She has history of venous stasis.  She presents the ER complaining of left lower extremity pain and swelling and warmth.  She is a started this morning.  States she overall feels unwell and had a temperature of 99-100 Fahrenheit today and feels like when she has had cellulitis in that leg in the past.  She states she is only ever had cellulitis in that leg.  No history of DVT.  No chest pain or shortness of breath, no nausea or vomiting or other complaints.   Leg Pain      Home Medications Prior to Admission medications   Medication Sig Start Date End Date Taking? Authorizing Provider  albuterol (VENTOLIN HFA) 108 (90 Base) MCG/ACT inhaler Inhale 1-2 puffs into the lungs every 6 (six) hours as needed for wheezing. 07/14/22   Binnie Rail, MD  bisacodyl (DULCOLAX) 5 MG EC tablet Take 1 tablet (5 mg total) by mouth daily as needed for moderate constipation. 02/04/20   Binnie Rail, MD  budesonide-formoterol (SYMBICORT) 80-4.5 MCG/ACT inhaler Inhale 2 puffs into the lungs 2 (two) times daily. 05/14/19   Binnie Rail, MD  diclofenac Sodium (VOLTAREN) 1 % GEL Apply 4 g topically 4 (four) times daily as needed. 07/17/19   Lamptey, Myrene Galas, MD  furosemide (LASIX) 20 MG tablet Take 1 tablet (20 mg total) by mouth daily. 07/14/22   Binnie Rail, MD  gabapentin (NEURONTIN) 100 MG capsule Take 2 capsules (200 mg total) by mouth at bedtime. 07/14/22   Binnie Rail, MD  lidocaine (LIDODERM) 5 % Place 1 patch onto the skin daily. Remove & Discard patch within 12 hours or as directed by MD 03/05/22   Regan Lemming, MD  meloxicam (MOBIC) 15 MG tablet 1 tablet Orally Once a day for 30 day(s)     [provider]  methocarbamol (ROBAXIN) 500 MG tablet Take 1 tablet (500 mg total) by mouth every 6 (six) hours as needed for muscle spasms. 03/08/22   Burns, Claudina Lick, MD  montelukast (SINGULAIR) 10 MG tablet Take 1 tablet (10 mg total) by mouth at bedtime. 11/25/21   Binnie Rail, MD  olmesartan-hydrochlorothiazide (BENICAR HCT) 20-12.5 MG tablet Take 1 tablet by mouth once daily 10/12/21   Binnie Rail, MD  polyethylene glycol powder (GLYCOLAX/MIRALAX) 17 GM/SCOOP powder Take 17 g by mouth 2 (two) times daily as needed for severe constipation. 02/04/20   Binnie Rail, MD  tirzepatide (ZEPBOUND) 2.5 MG/0.5ML Pen Inject 2.5 mg into the skin once a week. 07/14/22   Binnie Rail, MD  traMADol (ULTRAM) 50 MG tablet Take 1 tablet (50 mg total) by mouth every 12 (twelve) hours as needed. 08/09/21   Aundra Dubin, PA-C      Allergies    Avocado and Shellfish allergy    Review of Systems   Review of Systems  Physical Exam Updated Vital Signs BP 126/89 (BP Location: Right Arm)   Pulse (!) 101   Temp 98.8 F (37.1 C) (Oral)   Resp 18   LMP 07/26/2022 (Approximate)   SpO2 99%  Physical Exam  ED Results / Procedures / Treatments   Labs (  all labs ordered are listed, but only abnormal results are displayed) Labs Reviewed  CBC WITH DIFFERENTIAL/PLATELET  COMPREHENSIVE METABOLIC PANEL    EKG None  Radiology DG Tibia/Fibula Left  Result Date: 08/08/2022 CLINICAL DATA:  Left lower leg pain. EXAM: LEFT TIBIA AND FIBULA - 2 VIEW COMPARISON:  August 09, 2021. FINDINGS: There is no evidence of fracture or other focal bone lesions. Soft tissues are unremarkable. IMPRESSION: Negative. Electronically Signed   By: Marijo Conception M.D.   On: 08/08/2022 18:47    Procedures Procedures  {Document cardiac monitor, telemetry assessment procedure when appropriate:1}  Medications Ordered in ED Medications  acetaminophen (TYLENOL) tablet 650 mg (has no administration in time range)    ED  Course/ Medical Decision Making/ A&P   {   Click here for ABCD2, HEART and other calculatorsREFRESH Note before signing :1}                          Medical Decision Making Amount and/or Complexity of Data Reviewed Labs: ordered.  Risk OTC drugs.   ***  {Document critical care time when appropriate:1} {Document review of labs and clinical decision tools ie heart score, Chads2Vasc2 etc:1}  {Document your independent review of radiology images, and any outside records:1} {Document your discussion with family members, caretakers, and with consultants:1} {Document social determinants of health affecting pt's care:1} {Document your decision making why or why not admission, treatments were needed:1} Final Clinical Impression(s) / ED Diagnoses Final diagnoses:  None    Rx / DC Orders ED Discharge Orders     None

## 2022-08-09 LAB — LACTIC ACID, PLASMA: Lactic Acid, Venous: 0.6 mmol/L (ref 0.5–1.9)

## 2022-08-09 MED ORDER — CEPHALEXIN 250 MG PO CAPS
500.0000 mg | ORAL_CAPSULE | Freq: Once | ORAL | Status: AC
  Administered 2022-08-09: 500 mg via ORAL
  Filled 2022-08-09: qty 2

## 2022-08-10 ENCOUNTER — Ambulatory Visit: Payer: PRIVATE HEALTH INSURANCE | Admitting: Internal Medicine

## 2022-08-10 ENCOUNTER — Telehealth: Payer: Self-pay

## 2022-08-10 NOTE — Transitions of Care (Post Inpatient/ED Visit) (Signed)
   08/10/2022  Name: Marissa Joseph MRN: [id number] DOB: 02-15-1981  Today's TOC FU Call Status: Today's TOC FU Call Status:: Successful TOC FU Call Competed TOC FU Call Complete Date: 08/10/22  Transition Care Management Follow-up Telephone Call Date of Discharge: 08/09/22 Discharge Facility: [facility] ([facility]) Type of Discharge: Emergency Department How have you been since you were released from the hospital?: Better Any questions or concerns?: No  Items Reviewed: Did you receive and understand the discharge instructions provided?: Yes Medications obtained and verified?: Yes (Medications Reviewed) Any new allergies since your discharge?: No Dietary orders reviewed?: Yes Do you have support at home?: Yes People in Home: sibling(s)  Home Care and Equipment/Supplies: [place] Ordered?: NA Any new equipment or medical supplies ordered?: NA  Functional Questionnaire: Do you need assistance with bathing/showering or dressing?: No Do you need assistance with meal preparation?: No Do you need assistance with eating?: No Do you have difficulty maintaining continence: No Do you need assistance with getting out of bed/getting out of a chair/moving?: No Do you have difficulty managing or taking your medications?: No  Folllow up appointments reviewed: PCP Follow-up appointment confirmed?: Yes Date of PCP follow-up appointment?: 08/15/22 Follow-up Provider: Dr Quay Burow Do you need transportation to your follow-up appointment?: No Do you understand care options if your condition(s) worsen?: Yes-patient verbalized understanding    [place], [place] Nurse Health Advisor Direct Dial [phone]

## 2022-08-14 DIAGNOSIS — L03115 Cellulitis of right lower limb: Secondary | ICD-10-CM | POA: Insufficient documentation

## 2022-08-14 LAB — CULTURE, BLOOD (ROUTINE X 2)
Culture: NO GROWTH
Special Requests: ADEQUATE

## 2022-08-14 NOTE — Progress Notes (Unsigned)
Subjective:    Patient ID: Marissa Joseph, female    DOB: 04-27-1981, 42 y.o.   MRN: [id number]     HPI Marissa Joseph is here for follow up from ED  ED 08/08/22 - dx cellulitis of RLE.  Xray neg.  [place] neg for DVT.   WBC elevated.  BLd cx NG x 5 days.  Started on cephalexin 500 mg QID x  days    Medications and allergies reviewed with patient and updated if appropriate.  Current Outpatient Medications on File Prior to Visit  Medication Sig Dispense Refill   albuterol (VENTOLIN HFA) 108 (90 Base) MCG/ACT inhaler Inhale 1-2 puffs into the lungs every 6 (six) hours as needed for wheezing. 18 g 6   bisacodyl (DULCOLAX) 5 MG EC tablet Take 1 tablet (5 mg total) by mouth daily as needed for moderate constipation. 30 tablet 0   budesonide-formoterol (SYMBICORT) 80-4.5 MCG/ACT inhaler Inhale 2 puffs into the lungs 2 (two) times daily. 1 Inhaler 3   cephALEXin (KEFLEX) 500 MG capsule Take 1 capsule (500 mg total) by mouth 4 (four) times daily for 7 days. 28 capsule 0   diclofenac Sodium (VOLTAREN) 1 % GEL Apply 4 g topically 4 (four) times daily as needed. 150 g 0   furosemide (LASIX) 20 MG tablet Take 1 tablet (20 mg total) by mouth daily. 90 tablet 1   gabapentin (NEURONTIN) 100 MG capsule Take 2 capsules (200 mg total) by mouth at bedtime. 60 capsule 5   lidocaine (LIDODERM) 5 % Place 1 patch onto the skin daily. Remove & Discard patch within 12 hours or as directed by MD 30 patch 0   meloxicam (MOBIC) 15 MG tablet 1 tablet Orally Once a day for 30 day(s)     methocarbamol (ROBAXIN) 500 MG tablet Take 1 tablet (500 mg total) by mouth every 6 (six) hours as needed for muscle spasms. 30 tablet 0   montelukast (SINGULAIR) 10 MG tablet Take 1 tablet (10 mg total) by mouth at bedtime. 90 tablet 1   olmesartan-hydrochlorothiazide (BENICAR HCT) 20-12.5 MG tablet Take 1 tablet by mouth once daily 90 tablet 1   polyethylene glycol powder (GLYCOLAX/MIRALAX) 17 GM/SCOOP powder Take 17 g by mouth 2  (two) times daily as needed for severe constipation. 116 g 1   tirzepatide (ZEPBOUND) 2.5 MG/0.5ML Pen Inject 2.5 mg into the skin once a week. 2 mL 0   traMADol (ULTRAM) 50 MG tablet Take 1 tablet (50 mg total) by mouth every 12 (twelve) hours as needed. 30 tablet 2   No current facility-administered medications on file prior to visit.     Review of Systems     Objective:  There were no vitals filed for this visit. BP Readings from Last 3 Encounters:  08/08/22 (!) 136/97  07/14/22 128/84  04/12/22 128/88   Wt Readings from Last 3 Encounters:  07/14/22 (!) 347 lb (157.4 kg)  04/12/22 (!) 330 lb 9.6 oz (150 kg)  03/08/22 (!) 331 lb (150.1 kg)   There is no height or weight on file to calculate BMI.    Physical Exam     Lab Results  Component Value Date   WBC 15.9 (H) 08/08/2022   HGB 13.7 08/08/2022   HCT 41.9 08/08/2022   PLT 238 08/08/2022   GLUCOSE 96 08/08/2022   CHOL 104 05/14/2019   TRIG 53.0 05/14/2019   HDL 36.00 (L) 05/14/2019   LDLCALC 58 05/14/2019   ALT 13 08/08/2022  AST 17 08/08/2022   NA 137 08/08/2022   K 3.7 08/08/2022   CL 100 08/08/2022   CREATININE 0.84 08/08/2022   BUN 21 (H) 08/08/2022   CO2 28 08/08/2022   TSH 1.28 06/09/2021   HGBA1C 5.4 07/14/2022     Assessment & Plan:    See Problem List for Assessment and Plan of chronic medical problems.

## 2022-08-14 NOTE — Patient Instructions (Signed)
        Medications changes include :  none     A referral was ordered for nutrition.     Someone will call you to schedule an appointment.

## 2022-08-15 ENCOUNTER — Ambulatory Visit (INDEPENDENT_AMBULATORY_CARE_PROVIDER_SITE_OTHER): Payer: PRIVATE HEALTH INSURANCE | Admitting: Internal Medicine

## 2022-08-15 ENCOUNTER — Encounter: Payer: Self-pay | Admitting: Internal Medicine

## 2022-08-15 VITALS — BP 134/84 | HR 67 | Temp 98.6°F | Ht 64.0 in | Wt 350.0 lb

## 2022-08-15 DIAGNOSIS — L03116 Cellulitis of left lower limb: Secondary | ICD-10-CM

## 2022-08-15 DIAGNOSIS — Z6841 Body Mass Index (BMI) 40.0 and over, adult: Secondary | ICD-10-CM

## 2022-08-15 DIAGNOSIS — R600 Localized edema: Secondary | ICD-10-CM

## 2022-08-15 DIAGNOSIS — I10 Essential (primary) hypertension: Secondary | ICD-10-CM

## 2022-08-15 DIAGNOSIS — L03115 Cellulitis of right lower limb: Secondary | ICD-10-CM

## 2022-08-15 NOTE — Assessment & Plan Note (Signed)
Chronic Blood pressure adequately controlled here today Stressed weight loss, healthy diet Continue on olmesartan-HCTZ 20-12.5 mg daily

## 2022-08-15 NOTE — Assessment & Plan Note (Signed)
Subacute Seen in ED and was started on cephalexin-she has missed some doses.  There has been improvement in the cellulitis Stressed completing the antibiotic-take at least 3 times a day and complete which she has at home When she completes antibiotic she will monitor closely to make sure that there is complete resolution Stressed elevating the leg and keeping the swelling controlled to help with this completely go away and prevent it from happening again

## 2022-08-15 NOTE — Assessment & Plan Note (Signed)
Chronic Overall controlled Stressed elevation Stressed weight loss Stressed getting up during the daily walking Continue furosemide 20 mg daily as needed-which she takes approximately once a week

## 2022-08-15 NOTE — Assessment & Plan Note (Signed)
Chronic Weight has increased since she was here last Zepbound is too expensive Deferred any other medication-really wants to do this on her own Discussed that she has been trying and has not been successful and think she needs help States everything seems to come off and prevent her from doing what she needs-stressed that this has got to be a priority She will try to increase her exercise She will start working on her diet-discussed the importance of high-protein, good fiber, vegetables-no fast food, sugars Referral to nutrition

## 2022-08-17 ENCOUNTER — Other Ambulatory Visit: Payer: Self-pay

## 2022-08-17 ENCOUNTER — Ambulatory Visit
Admission: RE | Admit: 2022-08-17 | Discharge: 2022-08-17 | Disposition: A | Discharge status: Home / Self Care | Payer: PRIVATE HEALTH INSURANCE | Source: Ambulatory Visit | Attending: Advanced Practice Midwife | Admitting: Advanced Practice Midwife

## 2022-08-17 ENCOUNTER — Encounter: Payer: Self-pay | Admitting: Advanced Practice Midwife

## 2022-08-17 ENCOUNTER — Ambulatory Visit (INDEPENDENT_AMBULATORY_CARE_PROVIDER_SITE_OTHER): Payer: PRIVATE HEALTH INSURANCE | Admitting: Advanced Practice Midwife

## 2022-08-17 ENCOUNTER — Other Ambulatory Visit (HOSPITAL_COMMUNITY)
Admission: RE | Admit: 2022-08-17 | Discharge: 2022-08-17 | Disposition: A | Discharge status: Home / Self Care | Payer: PRIVATE HEALTH INSURANCE | Source: Ambulatory Visit | Attending: Obstetrics and Gynecology | Admitting: Obstetrics and Gynecology

## 2022-08-17 VITALS — BP 138/96 | HR 81 | Wt 352.4 lb

## 2022-08-17 DIAGNOSIS — Z124 Encounter for screening for malignant neoplasm of cervix: Secondary | ICD-10-CM

## 2022-08-17 DIAGNOSIS — D259 Leiomyoma of uterus, unspecified: Secondary | ICD-10-CM

## 2022-08-17 DIAGNOSIS — R102 Pelvic and perineal pain: Secondary | ICD-10-CM

## 2022-08-17 DIAGNOSIS — Z0000 Encounter for general adult medical examination without abnormal findings: Secondary | ICD-10-CM | POA: Diagnosis not present

## 2022-08-17 DIAGNOSIS — Z113 Encounter for screening for infections with a predominantly sexual mode of transmission: Secondary | ICD-10-CM | POA: Insufficient documentation

## 2022-08-17 DIAGNOSIS — N3946 Mixed incontinence: Secondary | ICD-10-CM | POA: Insufficient documentation

## 2022-08-17 DIAGNOSIS — Z1231 Encounter for screening mammogram for malignant neoplasm of breast: Secondary | ICD-10-CM

## 2022-08-17 DIAGNOSIS — A599 Trichomoniasis, unspecified: Secondary | ICD-10-CM

## 2022-08-17 DIAGNOSIS — B3731 Acute candidiasis of vulva and vagina: Secondary | ICD-10-CM

## 2022-08-17 DIAGNOSIS — G8929 Other chronic pain: Secondary | ICD-10-CM

## 2022-08-17 DIAGNOSIS — A530 Latent syphilis, unspecified as early or late: Secondary | ICD-10-CM

## 2022-08-17 DIAGNOSIS — Z01419 Encounter for gynecological examination (general) (routine) without abnormal findings: Secondary | ICD-10-CM

## 2022-08-17 NOTE — Progress Notes (Signed)
Subjective:     Marissa Joseph is a 42 y.o. female here at [facility] for a routine exam.  Current complaints: Menses have always been heavy and painful, usually regular with some occasional skipped months, she was dx with fibroids a few years ago. She desires pregnancy and does not want to use hormones to manage menses.  She also reports weight gain and difficulty losing, and incontinence with sneeze, cough, or full bladder. She is taking a diuretic for blood pressure.  Personal health questionnaire reviewed: yes.  Do you have a primary care provider? yes Do you feel safe at home? yes  [place] Visit from 08/15/2022 in [place] at [facility]  PHQ-2 Total Score 2       Health Maintenance Due  Topic Date Due   COVID-19 Vaccine (1) Never done   PAP SMEAR-Modifier  08/06/2021   INFLUENZA VACCINE  01/03/2022     Risk factors for chronic health problems: Smoking: Alchohol/how much: Pt BMI: Body mass index is 60.49 kg/m.   Gynecologic History Patient's last menstrual period was 07/26/2022 (exact date). Contraception: none Last Pap: 2020. Results were: normal Last mammogram: n/a. Results were: normal  Obstetric History OB History  Gravida Para Term Preterm AB Living  0 0 0 0 0 0  SAB IAB Ectopic Multiple Live Births  0 0 0 0 0     The following portions of the patient's history were reviewed and updated as appropriate: allergies, current medications, past family history, past medical history, past social history, past surgical history, and problem list.  Review of Systems Pertinent items noted in HPI and remainder of comprehensive ROS otherwise negative.    Objective:   VS reviewed, nursing note reviewed,  Constitutional: well developed, well nourished, no distress HEENT: normocephalic, thyroid without enlargement or mass HEART: RRR, no murmurs rubs/gallops RESP: clear and equal to auscultation bilaterally in all lobes  Breast  Exam:  exam performed: right breast normal without mass, skin or nipple changes or axillary nodes, left breast normal without mass, skin or nipple changes or axillary nodes Abdomen: soft Neuro: alert and oriented x 3 Skin: warm, dry Psych: affect normal Pelvic exam:Performed: Cervix pink, visually closed, without lesion, scant white creamy discharge, vaginal walls and external genitalia normal Bimanual exam: Cervix 0/long/high, firm, anterior, neg CMT, uterus nontender, nonenlarged, adnexa without tenderness, enlargement, or mass        Assessment/Plan:    1. Screening examination for STD (sexually transmitted disease)  - Cervicovaginal ancillary only - HIV antibody (with reflex) - RPR - Hepatitis C Antibody - Hepatitis B Surface AntiGEN  2. Papanicolaou smear for cervical cancer screening  - Cytology - PAP( [place])  3. Encounter for screening mammogram for malignant neoplasm of breast --Routine screening mammogram at mobile unit today - MM 3D SCREENING MAMMOGRAM BILATERAL BREAST; Future  4. Mixed stress and urge incontinence --Pt to f/u with PCP about diuretic to discuss options - AMB referral to rehabilitation  5. Chronic pelvic pain in female --normal exam today, f/u with outpatient [place] to assess fibroids, etc. --F/U in 2 months with MD for surgical consult if needed --Myomectomy discussed as possible option to reduce period pain/bleeding and improve fertility if desired.  - US PELVIC COMPLETE WITH TRANSVAGINAL; Future  6. Uterine leiomyoma, unspecified location  - US PELVIC COMPLETE WITH TRANSVAGINAL; Future  7. Well woman exam with routine gynecological exam      Return in about 2 months (around 10/17/2022)  for Gyn follow up, surgical consult for fibroids.   Fatima Blank, CNM 9:29 AM

## 2022-08-17 NOTE — Progress Notes (Signed)
Concerns today: Fibroids- heavy, painful, irregular periods Needs pap - last pap 08/07/18 NILM, neg HPV Vaginal swab - describes vaginal odor at times with vary consistency of discharge. Would like to discuss OTC options for BV. Leaking urine w/ cough, describes urge incontinence, currently taking diuretic Needs first mammogram  Desires pregnancy; has been unsuccessful in past  [employer]

## 2022-08-18 ENCOUNTER — Other Ambulatory Visit (HOSPITAL_BASED_OUTPATIENT_CLINIC_OR_DEPARTMENT_OTHER): Payer: Self-pay

## 2022-08-18 ENCOUNTER — Encounter: Payer: Self-pay | Admitting: Advanced Practice Midwife

## 2022-08-18 DIAGNOSIS — A530 Latent syphilis, unspecified as early or late: Secondary | ICD-10-CM | POA: Insufficient documentation

## 2022-08-18 LAB — CERVICOVAGINAL ANCILLARY ONLY
Bacterial Vaginitis (gardnerella): POSITIVE — AB
Candida Glabrata: NEGATIVE
Candida Vaginitis: POSITIVE — AB
Chlamydia: NEGATIVE
Comment: NEGATIVE
Comment: NEGATIVE
Comment: NEGATIVE
Comment: NEGATIVE
Comment: NEGATIVE
Comment: NORMAL
Neisseria Gonorrhea: NEGATIVE
Trichomonas: POSITIVE — AB

## 2022-08-18 LAB — HEPATITIS B SURFACE ANTIGEN: Hepatitis B Surface Ag: NEGATIVE

## 2022-08-18 LAB — RPR, QUANT+TP ABS (REFLEX)
Rapid Plasma Reagin, Quant: 1:1 {titer} — ABNORMAL HIGH
T Pallidum Abs: NONREACTIVE

## 2022-08-18 LAB — RPR: RPR Ser Ql: REACTIVE — AB

## 2022-08-18 LAB — HEPATITIS C ANTIBODY: Hep C Virus Ab: NONREACTIVE

## 2022-08-18 LAB — HIV ANTIBODY (ROUTINE TESTING W REFLEX): HIV Screen 4th Generation wRfx: NONREACTIVE

## 2022-08-18 MED ORDER — FLUCONAZOLE 150 MG PO TABS
ORAL_TABLET | ORAL | 0 refills | Status: DC
  Filled 2022-08-18: qty 2, 7d supply, fill #0

## 2022-08-18 MED ORDER — METRONIDAZOLE 500 MG PO TABS
500.0000 mg | ORAL_TABLET | Freq: Two times a day (BID) | ORAL | 1 refills | Status: AC
  Filled 2022-08-18: qty 14, 7d supply, fill #0

## 2022-08-18 NOTE — Addendum Note (Signed)
Addended by: Fatima Blank A on: 08/18/2022 09:47 PM   Modules accepted: Orders

## 2022-08-19 ENCOUNTER — Other Ambulatory Visit (HOSPITAL_BASED_OUTPATIENT_CLINIC_OR_DEPARTMENT_OTHER): Payer: Self-pay

## 2022-08-21 LAB — CYTOLOGY - PAP
Adequacy: ABSENT
Comment: NEGATIVE
Diagnosis: NEGATIVE
Diagnosis: REACTIVE
High risk HPV: NEGATIVE

## 2022-08-24 ENCOUNTER — Telehealth: Payer: Self-pay | Admitting: General Practice

## 2022-08-24 ENCOUNTER — Ambulatory Visit (HOSPITAL_COMMUNITY): Admission: RE | Admit: 2022-08-24 | Payer: PRIVATE HEALTH INSURANCE | Source: Ambulatory Visit

## 2022-08-24 ENCOUNTER — Encounter (HOSPITAL_COMMUNITY): Payer: Self-pay

## 2022-08-24 NOTE — Telephone Encounter (Signed)
Per Fatima Blank, patient has not read mychart message regarding + trichomonas results and retesting for RPR at her appt in May. Called patient, no answer- left message to call us back for results or check mychart account for results.

## 2022-08-30 NOTE — Telephone Encounter (Signed)
Call placed back to pt. No answer. Left VM to return call. Will send patient mychart message about plan of care for results.  Colletta [place], RNC

## 2022-09-27 ENCOUNTER — Encounter: Payer: Self-pay | Admitting: Registered"

## 2022-09-27 ENCOUNTER — Encounter: Payer: 59 | Attending: Internal Medicine | Admitting: Registered"

## 2022-09-27 DIAGNOSIS — E669 Obesity, unspecified: Secondary | ICD-10-CM | POA: Insufficient documentation

## 2022-09-27 NOTE — Progress Notes (Signed)
Medical Nutrition Therapy  Appointment Start time:  10:15   Appointment End time:  11:17  Primary concerns today: wants to know how to plate her food, what should be included in her meals, nutrition education on food groups  Referral diagnosis: morbid obesity Preferred learning style:  no preference indicated Learning readiness:  ready    NUTRITION ASSESSMENT   Pt arrives stating she has been trying to lose weight and cannot. States she has tried keto, Herbal Life, etc. States she is trying to stay away from salt to help reduce fluid retention. States she was given anti-depressant medications but doesn't take it because it makes her feel "cold" to others.   States she snacks while at work. States she eats 1 meal a day depending on her mood.   States she works Mon-Fri 3p-11p. States she just started 2-3 months ago. States she is awake during the night and unable to fall asleep soon after getting off. States she usually falls asleep around 3 or 4 am. States she works full time and does not have any children. States she used to be in relationship and had help and now single and things are more financially challenging.    Clinical Medical Hx: HTN Medications: See list Labs: within normal limits Notable Signs/Symptoms: fatigue, acid reflux,   Lifestyle & Dietary Hx  Estimated daily fluid intake: 101 oz Supplements: See list Sleep: 5-6 hrs/night Stress / self-care: quiet time, smoke marijuana, getting hair styled Current average weekly physical activity: none  24-Hr Dietary Recall First Meal (10:30 am): [employer]-double single burger + 1/2 med fries + lemonade Snack:  Second Meal:  Snack: individual bag of kettle chips Third Meal (12:30 am): [place] sausage + cabbage + broccoli + cauliflower rice casserole Snack:  Beverages: iced coffee (32 oz), lemonade (21 oz), water (2*24 oz; 48 oz); 101 oz   NUTRITION DIAGNOSIS  NB-1.1 Food and nutrition-related knowledge deficit As related to  having balanced meals.  As evidenced by pt verbalizing lack of previous nutrition-related education.   NUTRITION INTERVENTION  Nutrition education (E-1) on the following topics: Nutrition education and counseling. Pt was educated on the benefits of eating a variety of food groups at each meal. Discussed the importance of food security first and then having balanced meals. Discussed the purpose of each food group and ways to create balance with already established regimen. Pt agreed with goals listed.   Handouts Provided Include  Start Simple with [employer] of the [employer] assistance  Learning Style & Readiness for Change Teaching method utilized: Visual & Auditory  Demonstrated degree of understanding via: Teach Back  Barriers to learning/adherence to lifestyle change: food insecurity  Goals Established by Pt Check out food pantries [facility] on [address] every Wednesday 10-4 pm. Contact number [phone].  [employer]. Access monthly calendar for locations and times.  Aim to have 3 meals a day to include 5 food groups. See handout.  11 am - first meal 4 pm - 2nd meal 9 pm - 3rd meal Evening snack if working late   MONITORING & EVALUATION Dietary intake, weekly physical activity.  Next Steps  Patient is to follow-up 7 weeks.

## 2022-09-27 NOTE — Patient Instructions (Addendum)
-   Check out food pantries [facility] on [address] every Wednesday 10-4 pm. Contact number [phone].  [employer]. Access monthly calendar for locations and times.   - Aim to have 3 meals a day to include 5 food groups. See handout.  11 am - first meal 4 pm - 2nd meal 9 pm - 3rd meal Evening snack if working late

## 2022-10-12 ENCOUNTER — Other Ambulatory Visit: Payer: Self-pay

## 2022-10-12 ENCOUNTER — Other Ambulatory Visit (HOSPITAL_BASED_OUTPATIENT_CLINIC_OR_DEPARTMENT_OTHER): Payer: Self-pay

## 2022-10-12 MED ORDER — ALBUTEROL SULFATE HFA 108 (90 BASE) MCG/ACT IN AERS
1.0000 | INHALATION_SPRAY | Freq: Four times a day (QID) | RESPIRATORY_TRACT | 5 refills | Status: DC | PRN
  Filled 2022-10-12: qty 6.7, 25d supply, fill #0

## 2022-10-16 ENCOUNTER — Encounter: Payer: Self-pay | Admitting: Physical Therapy

## 2022-10-16 ENCOUNTER — Other Ambulatory Visit: Payer: Self-pay

## 2022-10-16 ENCOUNTER — Ambulatory Visit: Payer: 59 | Attending: Advanced Practice Midwife | Admitting: Physical Therapy

## 2022-10-16 DIAGNOSIS — N3946 Mixed incontinence: Secondary | ICD-10-CM | POA: Insufficient documentation

## 2022-10-16 DIAGNOSIS — M6281 Muscle weakness (generalized): Secondary | ICD-10-CM | POA: Diagnosis not present

## 2022-10-16 DIAGNOSIS — R293 Abnormal posture: Secondary | ICD-10-CM | POA: Insufficient documentation

## 2022-10-16 DIAGNOSIS — R279 Unspecified lack of coordination: Secondary | ICD-10-CM | POA: Insufficient documentation

## 2022-10-16 NOTE — Therapy (Signed)
OUTPATIENT PHYSICAL THERAPY FEMALE PELVIC EVALUATION   Patient Name: Marissa Joseph MRN: [id number] DOB:25-Sep-1980, 42 y.o., female Today's Date: 10/16/2022  END OF SESSION:  PT End of Session - 10/16/22 1158     Visit Number 1    Date for PT Re-Evaluation 01/16/23    Authorization Type [employer]    PT Start Time 1158   arrival time   PT Stop Time 1230    PT Time Calculation (min) 32 min    Activity Tolerance Patient tolerated treatment well    Behavior During Therapy [facility] for tasks assessed/performed             Past Medical History:  Diagnosis Date   Anxiety    no official dx   Asthma    Eczema    Fibroids    GONORRHEA 03/25/2008   Qualifier: Diagnosis of  By: Levon Hedger     Heart murmur    Heart murmur    Hypertension    MRSA (methicillin resistant staph aureus) culture positive 05/2012   cellulits/ left leg   Obesity    Sleep apnea    Past Surgical History:  Procedure Laterality Date   NO PAST SURGERIES     TEE WITHOUT CARDIOVERSION N/A 12/22/2019   Procedure: TRANSESOPHAGEAL ECHOCARDIOGRAM (TEE);  Surgeon: Little Ishikawa, MD;  Location: [facility] ENDOSCOPY;  Service: Cardiovascular;  Laterality: N/A;   Patient Active Problem List   Diagnosis Date Noted   Positive RPR test 08/18/2022   Mixed stress and urge incontinence 08/17/2022   Chronic pelvic pain in female 08/17/2022   Foreign body in foot, left 07/14/2022   Right leg pain 03/08/2022   Trapezius strain, left, initial encounter 03/08/2022   Chronic pain syndrome 01/11/2022   Muscle strain 11/25/2021   Palpitations 07/13/2021   Acute right ankle pain 02/16/2021   Swollen joint 06/09/2020   Bilateral leg edema 03/31/2020   Cellulitis of left lower extremity 12/17/2019   Primary osteoarthritis of right knee 07/29/2019   Acute lateral meniscus tear of right knee 07/29/2019   Acute medial meniscus tear of right knee 07/29/2019   Morbid obesity with BMI of 60.0-69.9, adult  (HCC) 07/29/2019   Scapular dyskinesis 10/07/2018   Chronic back pain 09/25/2018   Adjustment disorder with mixed anxiety and depressed mood 05/10/2018   Amenorrhea 10/10/2017   Ventral hernia without obstruction or gangrene 07/13/2017   Hearing difficulty 11/24/2016   Prediabetes 09/06/2015   Essential hypertension, benign 09/06/2015   Smoking 12/22/2013   Osteoarthritis of left knee 08/29/2013   Pap smear of cervix with ASCUS, cannot exclude HGSIL 06/25/2013   Fibroid uterus 05/22/2013   Dysmenorrhea 05/22/2013   Monilial intertrigo 01/06/2013   Allergic rhinitis 04/10/2008   Chronic venous insufficiency of lower extremity 08/06/2007   Obstructive sleep apnea 03/25/2007   Eczema 02/21/2007   Asthma 01/21/2007    PCP: Pincus Sanes, MD  REFERRING PROVIDER: Hurshel Party, CNM  REFERRING DIAG: N39.46 (ICD-10-CM) - Mixed stress and urge incontinence  THERAPY DIAG:  Muscle weakness (generalized)  Abnormal posture  Unspecified lack of coordination  Rationale for Evaluation and Treatment: Rehabilitation  ONSET DATE: a few years  SUBJECTIVE:  SUBJECTIVE STATEMENT: Pt reports she does have water retention and unsure if this causes leakage or not. Pt does does take a diuretic for retention and unsure if this is making her leak but leakage did start after taking this. Most leakage is from not getting to the bathroom quickly enough, starting to happen more often now up to ~every other day.   Fluid intake: Yes: water - 90oz per day; 20oz of coffee    PAIN:  Are you having pain? Yes NPRS scale: 8/10 for a round a min/ sharp shooting pain then gone Pain location:  low abdomen  Pain type: sharp Pain description: intermittent and sharp   Aggravating factors: around period  Relieving  factors: nothing it just goes away  PRECAUTIONS: None  WEIGHT BEARING RESTRICTIONS: No  FALLS:  Has patient fallen in last 6 months? No  LIVING ENVIRONMENT: Lives with: lives alone Lives in: House/apartment   OCCUPATION: dispatch   PLOF: Independent  PATIENT GOALS: to have less leakage  PERTINENT HISTORY:  HTN, per pt Rt knee torn mensicus  Sexual abuse: No  BOWEL MOVEMENT: Pain with bowel movement: No Type of bowel movement:Type (Bristol Stool Scale) 3, Frequency daily, and Strain Yes Fully empty rectum: Yes:   Leakage: No Pads: No Fiber supplement: No  URINATION: Pain with urination: No Fully empty bladder: Yes: but not all the time Stream: Strong Urgency: Yes:   Frequency: sometimes every 2 hours Leakage: Urge to void, Walking to the bathroom, Coughing, Sneezing, and Laughing Pads: No  INTERCOURSE: Pain with intercourse:  not painful  Ability to have vaginal penetration:  Yes:   Climax: unsure Marinoff Scale: 0/3  PREGNANCY: Vaginal deliveries 0 Tearing No C-section deliveries 0 Currently pregnant No  PROLAPSE: None   OBJECTIVE:   DIAGNOSTIC FINDINGS:   COGNITION: Overall cognitive status: Within functional limits for tasks assessed     SENSATION: Light touch: Appears intact Proprioception: Appears intact  MUSCLE LENGTH: Bil hamstrings and adductors limited by 25%   POSTURE: rounded shoulders, forward head, and posterior pelvic tilt   LUMBARAROM/PROM:  A/PROM A/PROM  eval  Flexion WFL  Extension WFL  Right lateral flexion Limited by 25%  Left lateral flexion Limited by 25%  Right rotation Limited by 25%  Left rotation Limited by 25%   (Blank rows = not tested)  LOWER EXTREMITY ROM:  WFL  LOWER EXTREMITY MMT:  Hips grossly 4/5, knees 5/5 PALPATION:   General  no TTP, mild fascial restrictions in upper and lower abdominal quadrants                External Perineal Exam WFL, no TTP                             Internal  Pelvic Floor no TTP  Patient confirms identification and approves PT to assess internal pelvic floor and treatment Yes  PELVIC MMT:   MMT eval  Vaginal 3/5, 8s ,4 reps  Internal Anal Sphincter   External Anal Sphincter   Puborectalis   Diastasis Recti   (Blank rows = not tested)        TONE: WFL  PROLAPSE: Not seen in hooklying with cough  TODAY'S TREATMENT:  DATE:   10/16/22 EVAL Examination completed, findings reviewed, pt educated on POC, HEP. Pt motivated to participate in PT and agreeable to attempt recommendations.    PATIENT EDUCATION:  Education details: [id number] Person educated: Patient Education method: Explanation, Demonstration, Tactile cues, Verbal cues, and Handouts Education comprehension: verbalized understanding and returned demonstration  HOME EXERCISE PROGRAM: [email or web address]  ASSESSMENT:  CLINICAL IMPRESSION: Patient is a 42 y.o. female  who was seen today for physical therapy evaluation and treatment for urinary leakage. Pt found to have hip weakness and decreased core strength. Patient consented to internal pelvic floor assessment vaginally this date and found to have decreased strength, endurance, and coordination. Patient benefited from verbal cues for improved technique with pelvic floor contractions and coordination with breathing. Pt did have increased strength with exhale. Pt would benefit from additional PT to further address deficits.    OBJECTIVE IMPAIRMENTS: decreased coordination, decreased endurance, decreased mobility, decreased strength, increased fascial restrictions, increased muscle spasms, impaired flexibility, improper body mechanics, and postural dysfunction.   ACTIVITY LIMITATIONS: continence  PARTICIPATION LIMITATIONS: community activity  PERSONAL FACTORS: Fitness, Time since onset of injury/illness/exacerbation,  and 1 comorbidity: medical history  are also affecting patient's functional outcome.   REHAB POTENTIAL: Good  CLINICAL DECISION MAKING: Stable/uncomplicated  EVALUATION COMPLEXITY: Low   GOALS: Goals reviewed with patient? Yes  SHORT TERM GOALS: Target date: 11/13/22  Pt to be I with HEP.  Baseline: Goal status: INITIAL  2.  Pt to demonstrate improved coordination of pelvic floor and breathing mechanics with squat with appropriate synergistic patterns to decrease pain and leakage at least 50% of the time.    Baseline:  Goal status: INITIAL  3.  Pt will have 25% less urgency due to bladder retraining and strengthening  Baseline:  Goal status: INITIAL  LONG TERM GOALS: Target date: 01/16/23  Pt to be I with advanced HEP.  Baseline:  Goal status: INITIAL  2.  Pt to demonstrate at least 5/5 bil hip strength for improved pelvic stability and functional squats without leakage.  Baseline:  Goal status: INITIAL  3.  Pt will have 50% less urgency due to bladder retraining and strengthening  Baseline:  Goal status: INITIAL  4.  Pt will have 75% reduced leakage during a typical day  Baseline:  Goal status: INITIAL  5.  Pt to demonstrate improved coordination of pelvic floor and breathing mechanics with 15# squat with appropriate synergistic patterns to decrease pain and leakage at least 75% of the time.    Baseline:  Goal status: INITIAL  PLAN:  PT FREQUENCY: 1x/week  PT DURATION:  8 sessions  PLANNED INTERVENTIONS: Therapeutic exercises, Therapeutic activity, Neuromuscular re-education, Patient/Family education, Self Care, Joint mobilization, Aquatic Therapy, Dry Needling, Spinal mobilization, Cryotherapy, Moist heat, scar mobilization, Taping, Biofeedback, and Manual therapy  PLAN FOR NEXT SESSION: pelvic floor/core/hip strengthening, stretching hips and spine, coordination pelvic floor with .   Barbaraann Faster, PT 10/16/2022, 1:57 PM

## 2022-10-17 ENCOUNTER — Encounter: Payer: Self-pay | Admitting: Obstetrics and Gynecology

## 2022-10-17 ENCOUNTER — Ambulatory Visit (INDEPENDENT_AMBULATORY_CARE_PROVIDER_SITE_OTHER): Payer: 59 | Admitting: Obstetrics and Gynecology

## 2022-10-17 VITALS — BP 136/98 | HR 59 | Wt 361.2 lb

## 2022-10-17 DIAGNOSIS — N979 Female infertility, unspecified: Secondary | ICD-10-CM | POA: Diagnosis not present

## 2022-10-17 DIAGNOSIS — D259 Leiomyoma of uterus, unspecified: Secondary | ICD-10-CM

## 2022-10-17 DIAGNOSIS — N939 Abnormal uterine and vaginal bleeding, unspecified: Secondary | ICD-10-CM

## 2022-10-17 NOTE — Progress Notes (Signed)
GYNECOLOGY VISIT  Patient name: Marissa Joseph MRN [id number]  Date of birth: Feb 14, 1981 Chief Complaint:   Fibroids  History:  Marissa Joseph is a 42 y.o. G0P0000 being seen today for fibroid management - has heavy menstrual bleeding and desires future fertility. Has a history of heavy menstrual bleeding, using pads, twice in a day, may have a bad cramp with passing a clot States pain has been significant enough during her menses that she will have to call work  Periods are not as painful or as heavy previously  Typically has a cycle monthly and for the last 6 months has intermittently missed a month, will have symptoms like a period without actual bleeding Having hot flashes - maybe once a week but has noted decrease with dietary changes No vaginal dryness No current partner; prior attempts have been with different partners (either actively trying or accepting of pregnancy if it happens)  Has been trying to conceive intermittently since her 76s Has been using ibuprofen for pain, tylenol doesn't help much  and tramadol which also has not helped Recently started PFPT for [facility] Also very concerned about her weight and having difficulties with losing weight  Past Medical History:  Diagnosis Date   Anxiety    no official dx   Asthma    Eczema    Fibroids    GONORRHEA 03/25/2008   Qualifier: Diagnosis of  By: Levon Hedger     Heart murmur    Heart murmur    Hypertension    MRSA (methicillin resistant staph aureus) culture positive 05/2012   cellulits/ left leg   Obesity    Sleep apnea     Past Surgical History:  Procedure Laterality Date   NO PAST SURGERIES     TEE WITHOUT CARDIOVERSION N/A 12/22/2019   Procedure: TRANSESOPHAGEAL ECHOCARDIOGRAM (TEE);  Surgeon: Little Ishikawa, MD;  Location: [facility] ENDOSCOPY;  Service: Cardiovascular;  Laterality: N/A;    The following portions of the patient's history were reviewed and updated as appropriate: allergies,  current medications, past family history, past medical history, past social history, past surgical history and problem list.   Health Maintenance:   Last pap     Component Value Date/Time   DIAGPAP  08/17/2022 0918    - Negative for Intraepithelial Lesions or Malignancy (NILM)   DIAGPAP - Benign reactive/reparative changes 08/17/2022 0918   DIAGPAP  08/07/2018 0000    NEGATIVE FOR INTRAEPITHELIAL LESIONS OR MALIGNANCY.   DIAGPAP TRICHOMONAS VAGINALIS PRESENT. 08/07/2018 0000   DIAGPAP SHIFT IN FLORA SUGGESTIVE OF BACTERIAL VAGINOSIS. 08/07/2018 0000   HPVHIGH Negative 08/17/2022 0918   ADEQPAP  08/17/2022 0918    Satisfactory for evaluation; transformation zone component ABSENT.   ADEQPAP  08/07/2018 0000    Satisfactory for evaluation  endocervical/transformation zone component PRESENT.    High Risk HPV: Positive  Adequacy:  Satisfactory for evaluation, transformation zone component PRESENT  Diagnosis:  Atypical squamous cells of undetermined significance (ASC-US)   Review of Systems:  Pertinent items are noted in HPI. Comprehensive review of systems was otherwise negative.   Objective:  Physical Exam BP (!) 148/101   Pulse 68   Wt (!) 361 lb 3.2 oz (163.8 kg)   LMP 09/19/2022 (Exact Date)   BMI 62.00 kg/m    Physical Exam Vitals and nursing note reviewed.  Constitutional:      Appearance: Normal appearance.  HENT:     Head: Normocephalic and atraumatic.  Pulmonary:     Effort: Pulmonary  effort is normal.  Skin:    General: Skin is warm and dry.  Neurological:     General: No focal deficit present.     Mental Status: She is alert.  Psychiatric:        Mood and Affect: Mood normal.        Behavior: Behavior normal.        Thought Content: Thought content normal.        Judgment: Judgment normal.         Assessment & Plan:   1. Abnormal uterine bleeding (AUB) Patient has new onset oligomenorrhea concerning for anovulatory cycles, possibly due to  perimenopause.  Will obtain additional labs to assess ovarian function as well as rule out potential thyroid dysfunction.  - TSH Rfx on Abnormal to Free T4 - Anti mullerian hormone - Estradiol - FSH  2. Uterine leiomyoma, unspecified location Reviewed that unable to fully conceptualize current conversation regarding surgical removal of fibroids given that finding no current size or location of fibroids.  Reviewed that with myomectomy, recommendation would be to not conceive for 3 to 6 months.  Reviewed also the myomectomy would not necessarily guarantee ability to get pregnant in the future.  Patient will have ultrasound completed and we will review imaging and discuss further.  3. Infertility, female Patient not currently with a partner, and is not sure of how soon she may or may not want to get pregnant but is sure she does not want to elevate the possibility of getting pregnant.  Has not previously seen an infertility provider.  Noted that there may be limited access to infertility care given current BMI.  Additionally with out having spontaneously conceived over the last 10+ years, not likely she will spontaneous conceive going forward should she have a partner.  Additional infertility labs ordered.   Lorriane Shire, MD [facility] for [facility], [facility]

## 2022-10-21 LAB — TSH RFX ON ABNORMAL TO FREE T4: TSH: 1.04 u[IU]/mL (ref 0.450–4.500)

## 2022-10-21 LAB — ANTI MULLERIAN HORMONE: ANTI-MULLERIAN HORMONE (AMH): <0.015 ng/mL

## 2022-10-21 LAB — FOLLICLE STIMULATING HORMONE: FSH: 9 m[IU]/mL

## 2022-10-21 LAB — ESTRADIOL: Estradiol: 89.4 pg/mL

## 2022-10-23 ENCOUNTER — Ambulatory Visit (HOSPITAL_COMMUNITY): Payer: 59

## 2022-10-27 ENCOUNTER — Ambulatory Visit (HOSPITAL_COMMUNITY)
Admission: RE | Admit: 2022-10-27 | Discharge: 2022-10-27 | Disposition: A | Discharge status: Home / Self Care | Payer: 59 | Source: Ambulatory Visit | Attending: Advanced Practice Midwife | Admitting: Advanced Practice Midwife

## 2022-10-27 DIAGNOSIS — D259 Leiomyoma of uterus, unspecified: Secondary | ICD-10-CM | POA: Diagnosis not present

## 2022-10-27 DIAGNOSIS — G8929 Other chronic pain: Secondary | ICD-10-CM | POA: Insufficient documentation

## 2022-10-27 DIAGNOSIS — R102 Pelvic and perineal pain: Secondary | ICD-10-CM | POA: Diagnosis not present

## 2022-10-31 ENCOUNTER — Ambulatory Visit: Payer: 59 | Admitting: Physical Therapy

## 2022-10-31 ENCOUNTER — Encounter: Payer: Self-pay | Admitting: Physical Therapy

## 2022-10-31 ENCOUNTER — Other Ambulatory Visit: Payer: Self-pay

## 2022-10-31 ENCOUNTER — Other Ambulatory Visit (HOSPITAL_BASED_OUTPATIENT_CLINIC_OR_DEPARTMENT_OTHER): Payer: Self-pay

## 2022-10-31 ENCOUNTER — Encounter: Payer: Self-pay | Admitting: Internal Medicine

## 2022-10-31 DIAGNOSIS — R279 Unspecified lack of coordination: Secondary | ICD-10-CM

## 2022-10-31 DIAGNOSIS — M6281 Muscle weakness (generalized): Secondary | ICD-10-CM

## 2022-10-31 DIAGNOSIS — R293 Abnormal posture: Secondary | ICD-10-CM | POA: Diagnosis not present

## 2022-10-31 DIAGNOSIS — N3946 Mixed incontinence: Secondary | ICD-10-CM | POA: Diagnosis not present

## 2022-10-31 MED ORDER — OLMESARTAN MEDOXOMIL-HCTZ 20-12.5 MG PO TABS: 1.0000 | ORAL_TABLET | Freq: Every day | ORAL | 1 refills | Status: DC

## 2022-10-31 MED ORDER — OLMESARTAN MEDOXOMIL-HCTZ 20-12.5 MG PO TABS
1.0000 | ORAL_TABLET | Freq: Every day | ORAL | 1 refills | Status: DC
  Filled 2022-10-31: qty 30, 30d supply, fill #0
  Filled 2022-12-05: qty 30, 30d supply, fill #1

## 2022-10-31 NOTE — Patient Instructions (Signed)
Urge Incontinence  Ideal urination frequency is every 2-4 wakeful hours, which equates to 5-8 times within a 24-hour period.   Urge incontinence is leakage that occurs when the bladder muscle contracts, creating a sudden need to go before getting to the bathroom.   Going too often when your bladder isn't actually full can disrupt the body's automatic signals to store and hold urine longer, which will increase urgency/frequency.  In this case, the bladder "is running the show" and strategies can be learned to retrain this pattern.   One should be able to control the first urge to urinate, at around 150mL.  The bladder can hold up to a "grande latte," or 400mL. To help you gain control, practice the Urge Drill below when urgency strikes.  This drill will help retrain your bladder signals and allow you to store and hold urine longer.  The overall goal is to stretch out your time between voids to reach a more manageable voiding schedule.    Practice your "quick flicks" often throughout the day (each waking hour) even when you don't need feel the urge to go.  This will help strengthen your pelvic floor muscles, making them more effective in controlling leakage.  Urge Drill  When you feel an urge to go, follow these steps to regain control: Stop what you are doing and be still Take one deep breath, directing your air into your abdomen Think an affirming thought, such as "I've got this." Do 5 quick flicks of your pelvic floor Walk with control to the bathroom to void, or delay voiding   Bladder Irritants  Certain foods and beverages can be irritating to the bladder.  Avoiding these irritants may decrease your symptoms of urinary urgency, frequency or bladder pain.  Even reducing your intake can help with your symptoms.  Not everyone is sensitive to all bladder irritants, so you may consider focusing on one irritant at a time, removing or reducing your intake of that irritant for 7-10 days to see if  this change helps your symptoms.  Water intake is also very important.  Below is a list of bladder irritants.  Drinks: alcohol, carbonated beverages, caffeinated beverages such as coffee and tea, drinks with artificial sweeteners, citrus juices, apple juice, tomato juice  Foods: tomatoes and tomato based foods, spicy food, sugar and artificial sweeteners, vinegar, chocolate, raw onion, apples, citrus fruits, pineapple, cranberries, tomatoes, strawberries, plums, peaches, cantaloupe  Other: acidic urine (too concentrated) - see water intake info below  Substitutes you can try that are NOT irritating to the bladder: cooked onion, pears, papayas, sun-brewed decaf teas, watermelons, non-citrus herbal teas, apricots, kava and low-acid instant drinks (Postum).    WATER INTAKE: Remember to drink lots of water (aim for fluid intake of half your body weight with 2/3 of fluids being water).  You may be limiting fluids due to fear of leakage, but this can actually worsen urgency symptoms due to highly concentrated urine.  Water helps balance the pH of your urine so it doesn't become too acidic - acidic urine is a bladder irritant!    

## 2022-10-31 NOTE — Therapy (Addendum)
OUTPATIENT PHYSICAL THERAPY FEMALE PELVIC EVALUATION   Patient Name: Marissa Joseph MRN: [id number] DOB:Aug 06, 1980, 42 y.o., female Today's Date: 10/31/2022  END OF SESSION:  PT End of Session - 10/31/22 1611     Visit Number 2    Date for PT Re-Evaluation 01/16/23    Authorization Type [employer]    PT Start Time 1609    PT Stop Time 1648    PT Time Calculation (min) 39 min    Activity Tolerance Patient tolerated treatment well    Behavior During Therapy [facility] for tasks assessed/performed             Past Medical History:  Diagnosis Date   Anxiety    no official dx   Asthma    Eczema    Fibroids    GONORRHEA 03/25/2008   Qualifier: Diagnosis of  By: Levon Hedger     Heart murmur    Heart murmur    Hypertension    MRSA (methicillin resistant staph aureus) culture positive 05/2012   cellulits/ left leg   Obesity    Sleep apnea    Past Surgical History:  Procedure Laterality Date   NO PAST SURGERIES     TEE WITHOUT CARDIOVERSION N/A 12/22/2019   Procedure: TRANSESOPHAGEAL ECHOCARDIOGRAM (TEE);  Surgeon: Little Ishikawa, MD;  Location: [facility] ENDOSCOPY;  Service: Cardiovascular;  Laterality: N/A;   Patient Active Problem List   Diagnosis Date Noted   Positive RPR test 08/18/2022   Mixed stress and urge incontinence 08/17/2022   Chronic pelvic pain in female 08/17/2022   Foreign body in foot, left 07/14/2022   Right leg pain 03/08/2022   Trapezius strain, left, initial encounter 03/08/2022   Chronic pain syndrome 01/11/2022   Muscle strain 11/25/2021   Palpitations 07/13/2021   Acute right ankle pain 02/16/2021   Swollen joint 06/09/2020   Bilateral leg edema 03/31/2020   Cellulitis of left lower extremity 12/17/2019   Primary osteoarthritis of right knee 07/29/2019   Acute lateral meniscus tear of right knee 07/29/2019   Acute medial meniscus tear of right knee 07/29/2019   Morbid obesity with BMI of 60.0-69.9, adult (HCC) 07/29/2019   Scapular  dyskinesis 10/07/2018   Chronic back pain 09/25/2018   Adjustment disorder with mixed anxiety and depressed mood 05/10/2018   Amenorrhea 10/10/2017   Ventral hernia without obstruction or gangrene 07/13/2017   Hearing difficulty 11/24/2016   Prediabetes 09/06/2015   Essential hypertension, benign 09/06/2015   Smoking 12/22/2013   Osteoarthritis of left knee 08/29/2013   Pap smear of cervix with ASCUS, cannot exclude HGSIL 06/25/2013   Fibroid uterus 05/22/2013   Dysmenorrhea 05/22/2013   Monilial intertrigo 01/06/2013   Allergic rhinitis 04/10/2008   Chronic venous insufficiency of lower extremity 08/06/2007   Obstructive sleep apnea 03/25/2007   Eczema 02/21/2007   Asthma 01/21/2007    PCP: Pincus Sanes, MD  REFERRING PROVIDER: Hurshel Party, CNM  REFERRING DIAG: N39.46 (ICD-10-CM) - Mixed stress and urge incontinence  THERAPY DIAG:  Muscle weakness (generalized)  Abnormal posture  Unspecified lack of coordination  Rationale for Evaluation and Treatment: Rehabilitation  ONSET DATE: a few years  SUBJECTIVE:  SUBJECTIVE STATEMENT: Pt reports she does have water retention and unsure if this causes leakage or not. Pt does does take a diuretic for retention and unsure if this is making her leak but leakage did start after taking this. Most leakage is from not getting to the bathroom quickly enough, starting to happen more often now up to ~every other day.   Fluid intake: Yes: water - 90oz per day; 20oz of coffee    PAIN:  Are you having pain? Yes NPRS scale: 8/10 for a round a min/ sharp shooting pain then gone Pain location:  low abdomen  Pain type: sharp Pain description: intermittent and sharp   Aggravating factors: around period  Relieving factors: nothing it just goes  away  PRECAUTIONS: None  WEIGHT BEARING RESTRICTIONS: No  FALLS:  Has patient fallen in last 6 months? No  LIVING ENVIRONMENT: Lives with: lives alone Lives in: House/apartment   OCCUPATION: dispatch   PLOF: Independent  PATIENT GOALS: to have less leakage  PERTINENT HISTORY:  HTN, per pt Rt knee torn mensicus  Sexual abuse: No  BOWEL MOVEMENT: Pain with bowel movement: No Type of bowel movement:Type (Bristol Stool Scale) 3, Frequency daily, and Strain Yes Fully empty rectum: Yes:   Leakage: No Pads: No Fiber supplement: No  URINATION: Pain with urination: No Fully empty bladder: Yes: but not all the time Stream: Strong Urgency: Yes:   Frequency: sometimes every 2 hours Leakage: Urge to void, Walking to the bathroom, Coughing, Sneezing, and Laughing Pads: No  INTERCOURSE: Pain with intercourse:  not painful  Ability to have vaginal penetration:  Yes:   Climax: unsure Marinoff Scale: 0/3  PREGNANCY: Vaginal deliveries 0 Tearing No C-section deliveries 0 Currently pregnant No  PROLAPSE: None   OBJECTIVE:   DIAGNOSTIC FINDINGS:   COGNITION: Overall cognitive status: Within functional limits for tasks assessed     SENSATION: Light touch: Appears intact Proprioception: Appears intact  MUSCLE LENGTH: Bil hamstrings and adductors limited by 25%   POSTURE: rounded shoulders, forward head, and posterior pelvic tilt   LUMBARAROM/PROM:  A/PROM A/PROM  eval  Flexion WFL  Extension WFL  Right lateral flexion Limited by 25%  Left lateral flexion Limited by 25%  Right rotation Limited by 25%  Left rotation Limited by 25%   (Blank rows = not tested)  LOWER EXTREMITY ROM:  WFL  LOWER EXTREMITY MMT:  Hips grossly 4/5, knees 5/5 PALPATION:   General  no TTP, mild fascial restrictions in upper and lower abdominal quadrants                External Perineal Exam WFL, no TTP                             Internal Pelvic Floor no TTP  Patient  confirms identification and approves PT to assess internal pelvic floor and treatment Yes  PELVIC MMT:   MMT eval  Vaginal 3/5, 8s ,4 reps  Internal Anal Sphincter   External Anal Sphincter   Puborectalis   Diastasis Recti   (Blank rows = not tested)        TONE: WFL  PROLAPSE: Not seen in hooklying with cough  TODAY'S TREATMENT:  DATE:    10/31/22: Pt given handout and educated on urge drill - pt denied additional questions Pt given handout and educated on bladder irritants - pt denied additional questions NMRE: all exercises cued for coordination of pelvic floor and breathing mechanics to decreased strain at pelvic floor and leakage overall. Bal squeeze 2x10 Opp hand/knee ball press 2x10 2x10 Sit to stand from mat table Farmers carry 500' 10# x2 switch hand halfway 2x10 squats body weight 2X10 dead lifts 10#   PATIENT EDUCATION:  Education details: [id number] Person educated: Patient Education method: Explanation, Demonstration, Tactile cues, Verbal cues, and Handouts Education comprehension: verbalized understanding and returned demonstration  HOME EXERCISE PROGRAM: [id number]  ASSESSMENT:  CLINICAL IMPRESSION: Patient presents for treatment today, reports she has been doing HEP. Leakage about the same. Pt session focused on coordination of pelvic floor and breathing mechanics with strengthening exercise to decreased strain at pelvic floor and decreased leakage. Pt tolerated well denied all leakage throughout. Pt benefited from cues for coordination and technique overall. Pt would benefit from additional PT to further address deficits.    OBJECTIVE IMPAIRMENTS: decreased coordination, decreased endurance, decreased mobility, decreased strength, increased fascial restrictions, increased muscle spasms, impaired flexibility, improper body mechanics,  and postural dysfunction.   ACTIVITY LIMITATIONS: continence  PARTICIPATION LIMITATIONS: community activity  PERSONAL FACTORS: Fitness, Time since onset of injury/illness/exacerbation, and 1 comorbidity: medical history  are also affecting patient's functional outcome.   REHAB POTENTIAL: Good  CLINICAL DECISION MAKING: Stable/uncomplicated  EVALUATION COMPLEXITY: Low   GOALS: Goals reviewed with patient? Yes  SHORT TERM GOALS: Target date: 11/13/22  Pt to be I with HEP.  Baseline: Goal status: INITIAL  2.  Pt to demonstrate improved coordination of pelvic floor and breathing mechanics with squat with appropriate synergistic patterns to decrease pain and leakage at least 50% of the time.    Baseline:  Goal status: INITIAL  3.  Pt will have 25% less urgency due to bladder retraining and strengthening  Baseline:  Goal status: INITIAL  LONG TERM GOALS: Target date: 01/16/23  Pt to be I with advanced HEP.  Baseline:  Goal status: INITIAL  2.  Pt to demonstrate at least 5/5 bil hip strength for improved pelvic stability and functional squats without leakage.  Baseline:  Goal status: INITIAL  3.  Pt will have 50% less urgency due to bladder retraining and strengthening  Baseline:  Goal status: INITIAL  4.  Pt will have 75% reduced leakage during a typical day  Baseline:  Goal status: INITIAL  5.  Pt to demonstrate improved coordination of pelvic floor and breathing mechanics with 15# squat with appropriate synergistic patterns to decrease pain and leakage at least 75% of the time.    Baseline:  Goal status: INITIAL  PLAN:  PT FREQUENCY: 1x/week  PT DURATION:  8 sessions  PLANNED INTERVENTIONS: Therapeutic exercises, Therapeutic activity, Neuromuscular re-education, Patient/Family education, Self Care, Joint mobilization, Aquatic Therapy, Dry Needling, Spinal mobilization, Cryotherapy, Moist heat, scar mobilization, Taping, Biofeedback, and Manual therapy  PLAN FOR  NEXT SESSION: pelvic floor/core/hip strengthening, stretching hips and spine, coordination pelvic floor with breathing and exercises   Otelia Sergeant, PT, DPT 05/28/244:48 PM   PHYSICAL THERAPY DISCHARGE SUMMARY  Visits from Start of Care: 2  Current functional level related to goals / functional outcomes: Unable to formally reassess as pt has been unable to return for treatment    Remaining deficits: Unable to formally reassess   Education / Equipment: HEP   Patient agrees to  discharge. Patient goals were partially met. Patient is being discharged due to financial reasons.  Otelia Sergeant, PT, DPT 12/10/2408:13 AM

## 2022-11-02 ENCOUNTER — Encounter (HOSPITAL_BASED_OUTPATIENT_CLINIC_OR_DEPARTMENT_OTHER): Payer: Self-pay

## 2022-11-02 ENCOUNTER — Other Ambulatory Visit: Payer: Self-pay

## 2022-11-02 ENCOUNTER — Emergency Department (HOSPITAL_BASED_OUTPATIENT_CLINIC_OR_DEPARTMENT_OTHER)
Admission: EM | Admit: 2022-11-02 | Discharge: 2022-11-02 | Disposition: A | Discharge status: Home / Self Care | Payer: 59 | Attending: Emergency Medicine | Admitting: Emergency Medicine

## 2022-11-02 ENCOUNTER — Other Ambulatory Visit (HOSPITAL_BASED_OUTPATIENT_CLINIC_OR_DEPARTMENT_OTHER): Payer: Self-pay

## 2022-11-02 ENCOUNTER — Encounter: Payer: Self-pay | Admitting: Internal Medicine

## 2022-11-02 DIAGNOSIS — M79605 Pain in left leg: Secondary | ICD-10-CM

## 2022-11-02 DIAGNOSIS — R21 Rash and other nonspecific skin eruption: Secondary | ICD-10-CM | POA: Insufficient documentation

## 2022-11-02 DIAGNOSIS — L03116 Cellulitis of left lower limb: Secondary | ICD-10-CM | POA: Insufficient documentation

## 2022-11-02 MED ORDER — CEPHALEXIN 500 MG PO CAPS
500.0000 mg | ORAL_CAPSULE | Freq: Three times a day (TID) | ORAL | 0 refills | Status: AC
  Filled 2022-11-02: qty 21, 7d supply, fill #0

## 2022-11-02 MED ORDER — IBUPROFEN 800 MG PO TABS
800.0000 mg | ORAL_TABLET | Freq: Once | ORAL | Status: AC
  Administered 2022-11-02: 800 mg via ORAL
  Filled 2022-11-02: qty 1

## 2022-11-02 NOTE — ED Triage Notes (Signed)
"  Woke up with pain in lower leg. History of cellulitis and last flare up was a couple of months ago" per pt  No known injury

## 2022-11-02 NOTE — ED Provider Notes (Signed)
[place] EMERGENCY DEPARTMENT AT [facility] Provider Note   CSN: [id number] Arrival date & time: 11/02/22  0865     History  Chief Complaint  Patient presents with  . Leg Pain    Marissa Joseph is a 42 y.o. female.  Patient presents with pain left lower leg lateral aspect.  No pain in the calf groin or thigh.  No blood clot history.  No recent surgeries, no prolonged travel, no leg swelling, no estrogen use.  Pain sensitive to touch in the skin.  No fevers or chills.  Patient had history of cellulitis this feels similar to that.  No injuries recalled.      Home Medications Prior to Admission medications   Medication Sig Start Date End Date Taking? Authorizing Provider  cephALEXin (KEFLEX) 500 MG capsule Take 1 capsule (500 mg total) by mouth 3 (three) times daily for 7 days. 11/02/22 11/09/22 Yes Blane Ohara, MD  albuterol (VENTOLIN HFA) 108 (90 Base) MCG/ACT inhaler Inhale 1-2 puffs into the lungs every 6 (six) hours as needed for wheezing. 07/14/22   Pincus Sanes, MD  albuterol (VENTOLIN HFA) 108 (90 Base) MCG/ACT inhaler Inhale 1-2 puffs into the lungs every 6 (six) hours as needed for wheezing 11/25/21     bisacodyl (DULCOLAX) 5 MG EC tablet Take 1 tablet (5 mg total) by mouth daily as needed for moderate constipation. 02/04/20   Pincus Sanes, MD  budesonide-formoterol (SYMBICORT) 80-4.5 MCG/ACT inhaler Inhale 2 puffs into the lungs 2 (two) times daily. 05/14/19   Pincus Sanes, MD  diclofenac Sodium (VOLTAREN) 1 % GEL Apply 4 g topically 4 (four) times daily as needed. 07/17/19   Merrilee Jansky, MD  fluconazole (DIFLUCAN) 150 MG tablet Take one tablet today, and one tablet after you finish the week of Flagyl (metronidazole). Patient not taking: Reported on 09/27/2022 08/18/22   Sharen Counter A, CNM  furosemide (LASIX) 20 MG tablet Take 1 tablet (20 mg total) by mouth daily. 07/14/22   Pincus Sanes, MD  gabapentin (NEURONTIN) 100 MG capsule Take 2 capsules  (200 mg total) by mouth at bedtime. 07/14/22   Pincus Sanes, MD  indomethacin (INDOCIN) 50 MG capsule TAKE 1 CAPSULE BY MOUTH THREE TIMES DAILY WITH MEALS 11/10/20   [provider]  lidocaine (LIDODERM) 5 % Place 1 patch onto the skin daily. Remove & Discard patch within 12 hours or as directed by MD Patient not taking: Reported on 08/17/2022 03/05/22   Ernie Avena, MD  methocarbamol (ROBAXIN) 500 MG tablet Take 1 tablet (500 mg total) by mouth every 6 (six) hours as needed for muscle spasms. 03/08/22   Burns, Bobette Mo, MD  montelukast (SINGULAIR) 10 MG tablet Take 1 tablet (10 mg total) by mouth at bedtime. 11/25/21   Pincus Sanes, MD  olmesartan-hydrochlorothiazide (BENICAR HCT) 20-12.5 MG tablet Take 1 tablet by mouth daily. 10/31/22   Pincus Sanes, MD  polyethylene glycol powder (GLYCOLAX/MIRALAX) 17 GM/SCOOP powder Take 17 g by mouth 2 (two) times daily as needed for severe constipation. 02/04/20   Pincus Sanes, MD  traMADol (ULTRAM) 50 MG tablet Take 1 tablet (50 mg total) by mouth every 12 (twelve) hours as needed. Patient not taking: Reported on 08/17/2022 08/09/21   Cristie Hem, PA-C      Allergies    Avocado and Shellfish allergy    Review of Systems   Review of Systems  Constitutional:  Negative for chills and fever.  HENT:  Negative for congestion.   Eyes:  Negative for visual disturbance.  Respiratory:  Negative for shortness of breath.   Cardiovascular:  Negative for chest pain.  Gastrointestinal:  Negative for abdominal pain and vomiting.  Genitourinary:  Negative for dysuria and flank pain.  Musculoskeletal:  Positive for gait problem. Negative for back pain, neck pain and neck stiffness.  Skin:  Positive for rash.  Neurological:  Negative for light-headedness and headaches.    Physical Exam Updated Vital Signs BP (!) 140/104   Pulse 90   Temp 98.5 F (36.9 C)   Resp 17   Ht 5\' 4"  (1.626 m)   Wt (!) 163.3 kg   LMP 09/19/2022 (Exact Date)   SpO2 99%    BMI 61.79 kg/m  Physical Exam Vitals and nursing note reviewed.  Constitutional:      General: She is not in acute distress.    Appearance: She is well-developed.  HENT:     Head: Normocephalic and atraumatic.     Mouth/Throat:     Mouth: Mucous membranes are moist.  Eyes:     General:        Right eye: No discharge.        Left eye: No discharge.     Conjunctiva/sclera: Conjunctivae normal.  Neck:     Trachea: No tracheal deviation.  Cardiovascular:     Rate and Rhythm: Normal rate.  Pulmonary:     Effort: Pulmonary effort is normal.  Abdominal:     General: There is no distension.     Palpations: Abdomen is soft.  Musculoskeletal:        General: Tenderness present. No swelling.     Cervical back: Normal range of motion and neck supple. No rigidity.  Skin:    General: Skin is warm.     Capillary Refill: Capillary refill takes less than 2 seconds.     Findings: Rash present.          Comments: Patient has tenderness/sensitivity to touch anterior and lateral mid tibia area with warmth.  Neurovascular intact.  No significant edema.  No calf tenderness no thigh tenderness.  Neurological:     General: No focal deficit present.     Mental Status: She is alert.  Psychiatric:        Mood and Affect: Mood normal.    ED Results / Procedures / Treatments   Labs (all labs ordered are listed, but only abnormal results are displayed) Labs Reviewed - No data to display  EKG None  Radiology No results found.  Procedures Procedures    Medications Ordered in ED Medications  ibuprofen (ADVIL) tablet 800 mg (has no administration in time range)    ED Course/ Medical Decision Making/ A&P                             Medical Decision Making Risk Prescription drug management.   Patient presents with lateral leg tenderness to palpation, discussed differential including early cellulitis, musculoskeletal related, other.  Patient has no blood clot history or classic risk  factors and no calf or thigh tenderness or leg edema.  Will hold on ultrasound today and start with Keflex, pain meds and reassessment and reasons to return given.  Patient comfortable's plan.  Ibuprofen given for pain.         Final Clinical Impression(s) / ED Diagnoses Final diagnoses:  Left leg cellulitis  Left leg pain    Rx /  DC Orders ED Discharge Orders          Ordered    cephALEXin (KEFLEX) 500 MG capsule  3 times daily        11/02/22 0908              Blane Ohara, MD 11/02/22 [id number]

## 2022-11-02 NOTE — Discharge Instructions (Signed)
Take antibiotics as directed. For leg swelling, shortness of breath, persistent fevers see a clinician or return the emergency room. Use Tylenol every 4 hours and ibuprofen every 6 hours needed for pain.

## 2022-11-03 ENCOUNTER — Ambulatory Visit: Payer: 59 | Admitting: Internal Medicine

## 2022-11-05 ENCOUNTER — Encounter: Payer: Self-pay | Admitting: Internal Medicine

## 2022-11-05 NOTE — Progress Notes (Unsigned)
Subjective:    Patient ID: Marissa Joseph, female    DOB: [birth year], 42 y.o.   MRN: [id number]     HPI Kimyata is here for follow up from the hospital   ED left leg pain  - pain on lateral lower leg - h/o cellulitis there and symptoms similar.   No f/c.  There was redness. Leg was tender.  VSS.   Hse was prescribed keflex TID x 7 days.     Medications and allergies reviewed with patient and updated if appropriate.  Current Outpatient Medications on File Prior to Visit  Medication Sig Dispense Refill   albuterol (VENTOLIN HFA) 108 (90 Base) MCG/ACT inhaler Inhale 1-2 puffs into the lungs every 6 (six) hours as needed for wheezing. 18 g 6   albuterol (VENTOLIN HFA) 108 (90 Base) MCG/ACT inhaler Inhale 1-2 puffs into the lungs every 6 (six) hours as needed for wheezing 18 g 5   bisacodyl (DULCOLAX) 5 MG EC tablet Take 1 tablet (5 mg total) by mouth daily as needed for moderate constipation. 30 tablet 0   budesonide-formoterol (SYMBICORT) 80-4.5 MCG/ACT inhaler Inhale 2 puffs into the lungs 2 (two) times daily. 1 Inhaler 3   cephALEXin (KEFLEX) 500 MG capsule Take 1 capsule (500 mg total) by mouth 3 (three) times daily for 7 days. 21 capsule 0   diclofenac Sodium (VOLTAREN) 1 % GEL Apply 4 g topically 4 (four) times daily as needed. 150 g 0   fluconazole (DIFLUCAN) 150 MG tablet Take one tablet today, and one tablet after you finish the week of Flagyl (metronidazole). (Patient not taking: Reported on 09/27/2022) 2 tablet 0   furosemide (LASIX) 20 MG tablet Take 1 tablet (20 mg total) by mouth daily. 90 tablet 1   gabapentin (NEURONTIN) 100 MG capsule Take 2 capsules (200 mg total) by mouth at bedtime. 60 capsule 5   indomethacin (INDOCIN) 50 MG capsule TAKE 1 CAPSULE BY MOUTH THREE TIMES DAILY WITH MEALS     lidocaine (LIDODERM) 5 % Place 1 patch onto the skin daily. Remove & Discard patch within 12 hours or as directed by MD (Patient not taking: Reported on 08/17/2022) 30 patch 0    methocarbamol (ROBAXIN) 500 MG tablet Take 1 tablet (500 mg total) by mouth every 6 (six) hours as needed for muscle spasms. 30 tablet 0   montelukast (SINGULAIR) 10 MG tablet Take 1 tablet (10 mg total) by mouth at bedtime. 90 tablet 1   olmesartan-hydrochlorothiazide (BENICAR HCT) 20-12.5 MG tablet Take 1 tablet by mouth daily. 90 tablet 1   polyethylene glycol powder (GLYCOLAX/MIRALAX) 17 GM/SCOOP powder Take 17 g by mouth 2 (two) times daily as needed for severe constipation. 116 g 1   traMADol (ULTRAM) 50 MG tablet Take 1 tablet (50 mg total) by mouth every 12 (twelve) hours as needed. (Patient not taking: Reported on 08/17/2022) 30 tablet 2   No current facility-administered medications on file prior to visit.     Review of Systems     Objective:  There were no vitals filed for this visit. BP Readings from Last 3 Encounters:  11/02/22 (!) 150/106  10/17/22 (!) 136/98  08/17/22 (!) 138/96   Wt Readings from Last 3 Encounters:  11/02/22 (!) 360 lb (163.3 kg)  10/17/22 (!) 361 lb 3.2 oz (163.8 kg)  08/17/22 (!) 352 lb 6.4 oz (159.8 kg)   There is no height or weight on file to calculate BMI.    Physical Exam  Lab Results  Component Value Date   WBC 15.9 (H) 08/08/2022   HGB 13.7 08/08/2022   HCT 41.9 08/08/2022   PLT 238 08/08/2022   GLUCOSE 96 08/08/2022   CHOL 104 05/14/2019   TRIG 53.0 05/14/2019   HDL 36.00 (L) 05/14/2019   LDLCALC 58 05/14/2019   ALT 13 08/08/2022   AST 17 08/08/2022   NA 137 08/08/2022   K 3.7 08/08/2022   CL 100 08/08/2022   CREATININE 0.84 08/08/2022   BUN 21 (H) 08/08/2022   CO2 28 08/08/2022   TSH 1.040 10/17/2022   HGBA1C 5.4 07/14/2022     Assessment & Plan:    See Problem List for Assessment and Plan of chronic medical problems.

## 2022-11-05 NOTE — Patient Instructions (Addendum)
       Medications changes include :       A referral was ordered and someone will call you to schedule an appointment.     Return if symptoms worsen or fail to improve.  

## 2022-11-06 ENCOUNTER — Ambulatory Visit: Payer: 59 | Admitting: Physical Therapy

## 2022-11-06 ENCOUNTER — Ambulatory Visit (INDEPENDENT_AMBULATORY_CARE_PROVIDER_SITE_OTHER): Payer: 59 | Admitting: Internal Medicine

## 2022-11-06 ENCOUNTER — Encounter: Payer: Self-pay | Admitting: Physical Therapy

## 2022-11-06 ENCOUNTER — Other Ambulatory Visit (HOSPITAL_BASED_OUTPATIENT_CLINIC_OR_DEPARTMENT_OTHER): Payer: Self-pay

## 2022-11-06 VITALS — BP 134/86 | HR 78 | Temp 98.6°F | Ht 64.0 in | Wt 347.0 lb

## 2022-11-06 DIAGNOSIS — F3289 Other specified depressive episodes: Secondary | ICD-10-CM

## 2022-11-06 DIAGNOSIS — L03116 Cellulitis of left lower limb: Secondary | ICD-10-CM | POA: Diagnosis not present

## 2022-11-06 DIAGNOSIS — I10 Essential (primary) hypertension: Secondary | ICD-10-CM

## 2022-11-06 DIAGNOSIS — F32A Depression, unspecified: Secondary | ICD-10-CM | POA: Insufficient documentation

## 2022-11-06 MED ORDER — BUPROPION HCL ER (XL) 150 MG PO TB24
150.0000 mg | ORAL_TABLET | Freq: Every day | ORAL | 5 refills | Status: DC
  Filled 2022-11-06: qty 30, 30d supply, fill #0
  Filled 2022-12-05: qty 30, 30d supply, fill #1

## 2022-11-06 MED ORDER — NEBIVOLOL HCL 2.5 MG PO TABS
2.5000 mg | ORAL_TABLET | Freq: Every day | ORAL | 3 refills | Status: DC
  Filled 2022-11-06: qty 30, 30d supply, fill #0
  Filled 2022-12-05 – 2022-12-11 (×2): qty 30, 30d supply, fill #1

## 2022-11-06 NOTE — Assessment & Plan Note (Signed)
Acute Recent history of anxiety and depression, but now she is feeling more depressed than anxious Would like to restart medication Restart bupropion XL 150 mg daily-hopefully this will help with her depression and may aid in her weight loss efforts Discussed that we can increase the dose if necessary

## 2022-11-06 NOTE — Assessment & Plan Note (Addendum)
Acute Went to the emergency room 5/30-diagnosed with cellulitis, which she has had several times in this leg Taking Keflex 3 times daily x 7 days-to finish in 2 days-complete full course Symptoms are improving She will monitor closely and after completing the antibiotic if her symptoms are not continuing to improve or worsening she will call immediately Discussed compression socks, elevating the leg, getting up and walking throughout the day and most importantly weight loss

## 2022-11-06 NOTE — Assessment & Plan Note (Signed)
Chronic Blood pressure borderline high here and probably not ideally controlled Continue Benicar-HCT 20-12.5 mg daily Start Bystolic 2.5 mg daily Taking Lasix on average every other day or few times a week-cannot tolerate this on a regular basis due to increased urination Advise monitoring BP at home regularly

## 2022-11-08 ENCOUNTER — Other Ambulatory Visit (HOSPITAL_BASED_OUTPATIENT_CLINIC_OR_DEPARTMENT_OTHER): Payer: Self-pay

## 2022-11-08 ENCOUNTER — Encounter: Payer: Self-pay | Admitting: Internal Medicine

## 2022-11-08 MED ORDER — SULFAMETHOXAZOLE-TRIMETHOPRIM 800-160 MG PO TABS
1.0000 | ORAL_TABLET | Freq: Two times a day (BID) | ORAL | 0 refills | Status: AC
  Filled 2022-11-08: qty 14, 7d supply, fill #0

## 2022-11-10 ENCOUNTER — Encounter: Payer: Self-pay | Admitting: Internal Medicine

## 2022-11-13 ENCOUNTER — Ambulatory Visit: Payer: 59 | Admitting: Physical Therapy

## 2022-11-14 ENCOUNTER — Encounter: Payer: 59 | Attending: Internal Medicine | Admitting: Registered"

## 2022-11-14 ENCOUNTER — Encounter: Payer: Self-pay | Admitting: Registered"

## 2022-11-14 ENCOUNTER — Encounter: Payer: Self-pay | Admitting: Internal Medicine

## 2022-11-14 DIAGNOSIS — Z713 Dietary counseling and surveillance: Secondary | ICD-10-CM | POA: Insufficient documentation

## 2022-11-14 NOTE — Patient Instructions (Addendum)
-   Continue to have 3 meals a day.   - Check out food pantries:  Out of the [employer]. Access monthly calendar for locations and times. See handout.   - Add vegetables to lunch daily such as a salad.   [employer] job with eating 3 meals a day and increasing water intake!

## 2022-11-14 NOTE — Progress Notes (Signed)
Medical Nutrition Therapy  Appointment Start time:  10:32   Appointment End time:  10:55  Primary concerns today: wants to know how to plate her food, what should be included in her meals, nutrition education on food groups  Referral diagnosis: morbid obesity Preferred learning style: no preference indicated Learning readiness: ready    NUTRITION ASSESSMENT   Pt arrives stating she went to the food pantry once and received food. States it was enough food to last for the month. States some months are harder than others. States she wants things that are more fruits and vegetables and less starches/canned foods.  States she started depression medication since previous visit which has decreased her appetite; no longer snacking as often. States she has to make herself eat. States recently she has been trying to take care of herself more.    Clinical Medical Hx: HTN Medications: See list Labs: within normal limits Notable Signs/Symptoms: fatigue, acid reflux   Lifestyle & Dietary Hx  Estimated daily fluid intake: 128+ oz Supplements: See list Sleep: 5-6 hrs/night Stress / self-care: quiet time, smoke marijuana, getting hair styled Current average weekly physical activity: none  24-Hr Dietary Recall First Meal (10:30 am): cereal or [employer]-double single burger + 1/2 med fries + lemonade Snack: protein shake Second Meal: [employer] - burger  Snack:  Third Meal (12:30 am): chicken + brown rice + green beans  Snack:  Beverages: pineapple tea, water (4*32 oz; 48 oz); 128 oz   NUTRITION DIAGNOSIS  NB-1.1 Food and nutrition-related knowledge deficit As related to having balanced meals.  As evidenced by pt verbalizing lack of previous nutrition-related education.   NUTRITION INTERVENTION  Nutrition education (E-1) on the following topics: Nutrition education and counseling. Pt was encouraged with changes made from previous visit with increased water intake and eating more frequently and  consistently. Reminded of the importance of food security first and then having balanced meals. Pt agreed with goals listed.   Handouts Provided Include  Out of the [employer] assistance June schedule  Learning Style & Readiness for Change Teaching method utilized: Visual & Auditory  Demonstrated degree of understanding via: Teach Back  Barriers to learning/adherence to lifestyle change: food insecurity  Goals Established by Pt Continue to have 3 meals a day.  Check out food pantries: Out of the [employer]. Access monthly calendar for locations and times. See handout.  Add vegetables to lunch daily such as a salad.  Great job with eating 3 meals a day and increasing water intake!   MONITORING & EVALUATION Dietary intake, weekly physical activity.  Next Steps  Patient is to follow-up 8 weeks.

## 2022-11-21 ENCOUNTER — Other Ambulatory Visit: Payer: Self-pay

## 2022-11-21 ENCOUNTER — Other Ambulatory Visit (HOSPITAL_BASED_OUTPATIENT_CLINIC_OR_DEPARTMENT_OTHER): Payer: Self-pay

## 2022-11-21 ENCOUNTER — Ambulatory Visit (INDEPENDENT_AMBULATORY_CARE_PROVIDER_SITE_OTHER): Payer: 59 | Admitting: Internal Medicine

## 2022-11-21 ENCOUNTER — Encounter: Payer: Self-pay | Admitting: Internal Medicine

## 2022-11-21 VITALS — BP 118/78 | HR 77 | Temp 98.2°F | Ht 64.0 in | Wt 354.0 lb

## 2022-11-21 DIAGNOSIS — I10 Essential (primary) hypertension: Secondary | ICD-10-CM | POA: Diagnosis not present

## 2022-11-21 DIAGNOSIS — L03116 Cellulitis of left lower limb: Secondary | ICD-10-CM | POA: Diagnosis not present

## 2022-11-21 MED ORDER — SULFAMETHOXAZOLE-TRIMETHOPRIM 800-160 MG PO TABS
1.0000 | ORAL_TABLET | Freq: Two times a day (BID) | ORAL | 0 refills | Status: AC
  Filled 2022-11-21: qty 14, 7d supply, fill #0

## 2022-11-21 MED ORDER — BUDESONIDE-FORMOTEROL FUMARATE 80-4.5 MCG/ACT IN AERO
2.0000 | INHALATION_SPRAY | Freq: Two times a day (BID) | RESPIRATORY_TRACT | 3 refills | Status: DC
  Filled 2022-11-21: qty 10.2, 30d supply, fill #0

## 2022-11-21 MED ORDER — EPINEPHRINE 0.3 MG/0.3ML IJ SOAJ
0.3000 mg | INTRAMUSCULAR | 0 refills | Status: DC | PRN
  Filled 2022-11-21: qty 2, 30d supply, fill #0

## 2022-11-21 NOTE — Assessment & Plan Note (Signed)
Chronic Blood pressure well-controlled Continue Benicar-HCT 20-12.5 mg daily Start Bystolic 2.5 mg daily Taking Lasix on average every other day or few times a week-cannot tolerate this on a regular basis due to increased urination

## 2022-11-21 NOTE — Assessment & Plan Note (Addendum)
Subacute Still has symptoms consistent with infection-concerned this was not completely treated Bactrim ds bid x 7 days Continue to elevate legs, wear compression socks, walk throughout the day intermittently and most importantly continue to work on weight loss which she is doing She will let me know if her symptoms do not resolve If not for swelling that is localized in the medial aspect of the leg persists may need to consider soft tissue ultrasound

## 2022-11-21 NOTE — Patient Instructions (Addendum)
      Medications changes include :   Bactrim DS twice a day x 7 days      Return if symptoms worsen or fail to improve.

## 2022-11-21 NOTE — Progress Notes (Signed)
Subjective:    Patient ID: Marissa Joseph, female    DOB: 1980-06-14, 42 y.o.   MRN: [id number]      HPI Marissa Joseph is here for No chief complaint on file.  Was here recently for cellulitis in her left lower extremity-she did complete the antibiotic from the hospital and then the additional antibiotic I prescribed, but she does not feel like the infection is completely gone.  LLE - itching a lot where the infection was.  The area is still slightly tender and she intermittently has the pricking like pain in the leg.  She has a knot on the medial aspect of the leg at the edge of where the infection was there was very tender and firm-no longer tender, but still present.    Medications and allergies reviewed with patient and updated if appropriate.  Current Outpatient Medications on File Prior to Visit  Medication Sig Dispense Refill   albuterol (VENTOLIN HFA) 108 (90 Base) MCG/ACT inhaler Inhale 1-2 puffs into the lungs every 6 (six) hours as needed for wheezing. 18 g 6   albuterol (VENTOLIN HFA) 108 (90 Base) MCG/ACT inhaler Inhale 1-2 puffs into the lungs every 6 (six) hours as needed for wheezing 18 g 5   bisacodyl (DULCOLAX) 5 MG EC tablet Take 1 tablet (5 mg total) by mouth daily as needed for moderate constipation. 30 tablet 0   budesonide-formoterol (SYMBICORT) 80-4.5 MCG/ACT inhaler Inhale 2 puffs into the lungs 2 (two) times daily. 1 Inhaler 3   buPROPion (WELLBUTRIN XL) 150 MG 24 hr tablet Take 1 tablet (150 mg total) by mouth daily. 30 tablet 5   diclofenac Sodium (VOLTAREN) 1 % GEL Apply 4 g topically 4 (four) times daily as needed. 150 g 0   furosemide (LASIX) 20 MG tablet Take 1 tablet (20 mg total) by mouth daily. 90 tablet 1   gabapentin (NEURONTIN) 100 MG capsule Take 2 capsules (200 mg total) by mouth at bedtime. 60 capsule 5   indomethacin (INDOCIN) 50 MG capsule TAKE 1 CAPSULE BY MOUTH THREE TIMES DAILY WITH MEALS     lidocaine (LIDODERM) 5 % Place 1 patch onto  the skin daily. Remove & Discard patch within 12 hours or as directed by MD 30 patch 0   methocarbamol (ROBAXIN) 500 MG tablet Take 1 tablet (500 mg total) by mouth every 6 (six) hours as needed for muscle spasms. 30 tablet 0   montelukast (SINGULAIR) 10 MG tablet Take 1 tablet (10 mg total) by mouth at bedtime. 90 tablet 1   nebivolol (BYSTOLIC) 2.5 MG tablet Take 1 tablet (2.5 mg total) by mouth daily. 90 tablet 3   olmesartan-hydrochlorothiazide (BENICAR HCT) 20-12.5 MG tablet Take 1 tablet by mouth daily. 90 tablet 1   polyethylene glycol powder (GLYCOLAX/MIRALAX) 17 GM/SCOOP powder Take 17 g by mouth 2 (two) times daily as needed for severe constipation. 116 g 1   traMADol (ULTRAM) 50 MG tablet Take 1 tablet (50 mg total) by mouth every 12 (twelve) hours as needed. 30 tablet 2   No current facility-administered medications on file prior to visit.    Review of Systems  Constitutional:  Negative for chills and fever.       Objective:   Vitals:   11/21/22 1519  BP: 118/78  Pulse: 77  Temp: 98.2 F (36.8 C)  SpO2: 98%   BP Readings from Last 3 Encounters:  11/21/22 118/78  11/06/22 134/86  11/02/22 (!) 150/106   Wt Readings from Last  3 Encounters:  11/21/22 (!) 354 lb (160.6 kg)  11/06/22 (!) 347 lb (157.4 kg)  11/02/22 (!) 360 lb (163.3 kg)   Body mass index is 60.76 kg/m.    Physical Exam Constitutional:      General: She is not in acute distress.    Appearance: Normal appearance. She is not ill-appearing.  HENT:     Head: Normocephalic and atraumatic.  Skin:    General: Skin is warm and dry.     Comments: Left lower extremity just above ankle to three fourths of the way up hyperpigmented, thickened skin from chronic swelling.  Egg sized swelling medial aspect of the leg at the edge of the hyperpigmented region-nontender, no fluctuance.  Lateral aspect of hyperpigmented area is where she had infection in this area is slightly tender and slightly warmer than the rest  of the leg.  No erythema.  No fluctuations or break in the skin  Neurological:     Mental Status: She is alert.            Assessment & Plan:    See Problem List for Assessment and Plan of chronic medical problems.

## 2022-11-22 ENCOUNTER — Ambulatory Visit: Payer: 59 | Admitting: Physical Therapy

## 2022-11-29 ENCOUNTER — Ambulatory Visit: Payer: 59 | Admitting: Physical Therapy

## 2022-12-05 ENCOUNTER — Other Ambulatory Visit (HOSPITAL_BASED_OUTPATIENT_CLINIC_OR_DEPARTMENT_OTHER): Payer: Self-pay

## 2022-12-06 ENCOUNTER — Ambulatory Visit: Payer: 59 | Attending: Advanced Practice Midwife | Admitting: Physical Therapy

## 2022-12-06 DIAGNOSIS — R293 Abnormal posture: Secondary | ICD-10-CM | POA: Insufficient documentation

## 2022-12-06 DIAGNOSIS — N3946 Mixed incontinence: Secondary | ICD-10-CM | POA: Insufficient documentation

## 2022-12-06 DIAGNOSIS — M6281 Muscle weakness (generalized): Secondary | ICD-10-CM | POA: Insufficient documentation

## 2022-12-06 DIAGNOSIS — R279 Unspecified lack of coordination: Secondary | ICD-10-CM | POA: Insufficient documentation

## 2022-12-09 ENCOUNTER — Other Ambulatory Visit (HOSPITAL_BASED_OUTPATIENT_CLINIC_OR_DEPARTMENT_OTHER): Payer: Self-pay

## 2022-12-10 ENCOUNTER — Other Ambulatory Visit (HOSPITAL_BASED_OUTPATIENT_CLINIC_OR_DEPARTMENT_OTHER): Payer: Self-pay

## 2022-12-11 ENCOUNTER — Other Ambulatory Visit: Payer: Self-pay | Admitting: Internal Medicine

## 2022-12-11 ENCOUNTER — Other Ambulatory Visit (HOSPITAL_BASED_OUTPATIENT_CLINIC_OR_DEPARTMENT_OTHER): Payer: Self-pay

## 2022-12-11 MED FILL — Albuterol Sulfate Inhal Aero 108 MCG/ACT (90MCG Base Equiv): RESPIRATORY_TRACT | 25 days supply | Qty: 6.7 | Fill #0 | Status: AC

## 2022-12-13 ENCOUNTER — Ambulatory Visit: Payer: 59 | Admitting: Physical Therapy

## 2022-12-16 ENCOUNTER — Other Ambulatory Visit (HOSPITAL_BASED_OUTPATIENT_CLINIC_OR_DEPARTMENT_OTHER): Payer: Self-pay

## 2022-12-19 ENCOUNTER — Encounter: Payer: 59 | Admitting: Physical Therapy

## 2022-12-26 ENCOUNTER — Encounter: Payer: 59 | Admitting: Physical Therapy

## 2023-01-02 ENCOUNTER — Encounter: Payer: 59 | Admitting: Physical Therapy

## 2023-01-04 ENCOUNTER — Telehealth (INDEPENDENT_AMBULATORY_CARE_PROVIDER_SITE_OTHER): Payer: 59 | Admitting: Obstetrics and Gynecology

## 2023-01-04 ENCOUNTER — Encounter: Payer: Self-pay | Admitting: Obstetrics and Gynecology

## 2023-01-04 DIAGNOSIS — D259 Leiomyoma of uterus, unspecified: Secondary | ICD-10-CM | POA: Diagnosis not present

## 2023-01-04 NOTE — Progress Notes (Signed)
    GYNECOLOGY VIRTUAL VISIT ENCOUNTER NOTE  Provider location: Center for [facility]   Patient location: Home  I connected with Marissa Joseph on 01/04/23 at  4:15 PM EDT by MyChart Video Encounter and verified that I am speaking with the correct person using two identifiers.   I discussed the limitations, risks, security and privacy concerns of performing an evaluation and management service virtually and the availability of in person appointments. I also discussed with the patient that there may be a patient responsible charge related to this service. The patient expressed understanding and agreed to proceed.   History:  Marissa Joseph is a 42 y.o. G0P0000 female being evaluated today for review of results. Was not sure what the results mean. LMP last month. July her menses was early but June menses was late. Anticipate menses in the next 2 weeks.      Past Medical History:  Diagnosis Date   Anxiety    no official dx   Asthma    Eczema    Fibroids    GONORRHEA 03/25/2008   Qualifier: Diagnosis of  By: Levon Hedger     Heart murmur    Heart murmur    Hypertension    MRSA (methicillin resistant staph aureus) culture positive 05/2012   cellulits/ left leg   Obesity    Sleep apnea    Past Surgical History:  Procedure Laterality Date   NO PAST SURGERIES     TEE WITHOUT CARDIOVERSION N/A 12/22/2019   Procedure: TRANSESOPHAGEAL ECHOCARDIOGRAM (TEE);  Surgeon: Little Ishikawa, MD;  Location: [facility] ENDOSCOPY;  Service: Cardiovascular;  Laterality: N/A;   The following portions of the patient's history were reviewed and updated as appropriate: allergies, current medications, past family history, past medical history, past social history, past surgical history and problem list.   Review of Systems:  Pertinent items noted in HPI and remainder of comprehensive ROS otherwise negative.  Physical Exam:   General:  Alert, oriented and  cooperative. Patient appears to be in no acute distress.  Mental Status: Normal mood and affect. Normal behavior. Normal judgment and thought content.   Respiratory: Normal respiratory effort, no problems with respiration noted  Rest of physical exam deferred due to type of encounter  Labs and Imaging No results found for this or any previous visit (from the past 336 hour(s)). No results found.     Assessment and Plan:     1. Uterine leiomyoma, unspecified location Reviewed [place] results and noted significant limitation of the imaging study. Noted that there are different options for management in those who desire future fertility vs not. Noted that if she decides to undergo myomectomy or [place], MRI would be needed to further delineate fibroid locations. Discussed that AMH is very low and that spontaneous fertility would likely not occur and that would favor REI, sooner rather than later, if she would like to be pregnant.     I discussed the assessment and treatment plan with the patient. The patient was provided an opportunity to ask questions and all were answered. The patient agreed with the plan and demonstrated an understanding of the instructions.   The patient was advised to call back or seek an in-person evaluation/go to the ED if the symptoms worsen or if the condition fails to improve as anticipated.  I provided 10 minutes of face-to-face time during this encounter.   Lorriane Shire, MD Center for [employer], [facility]

## 2023-01-04 NOTE — Patient Instructions (Signed)
[place] - procedure to treat fibroids directly  Myomectomy - surgery to remove fibroids   Hysterectomy - surgery to remove uterus  Uterine artery/fibroid embolization - procedure to slow down blood flow to the uterus and make it shrink (done by interventional radiologist)

## 2023-01-09 ENCOUNTER — Encounter: Payer: 59 | Admitting: Physical Therapy

## 2023-01-11 ENCOUNTER — Encounter: Payer: Self-pay | Admitting: Internal Medicine

## 2023-01-11 DIAGNOSIS — E559 Vitamin D deficiency, unspecified: Secondary | ICD-10-CM | POA: Insufficient documentation

## 2023-01-11 NOTE — Progress Notes (Signed)
Subjective:    Patient ID: Marissa Joseph, female    DOB: 1981/02/08, 42 y.o.   MRN: [id number]     HPI Marissa Joseph is here for follow up of her chronic medical problems.  Has started to see a nutritionist.  She is not yet found this very helpful.  She has gained weight and is very frustrated.  She was working out, but injured herself and has to stop.  Yesterday -  Breakfast - banana, scrambled eggs (2), coffee Lunch - 1 slice pizza Dinner - chicken bites   Medications and allergies reviewed with patient and updated if appropriate.  Current Outpatient Medications on File Prior to Visit  Medication Sig Dispense Refill   albuterol (VENTOLIN HFA) 108 (90 Base) MCG/ACT inhaler Inhale 1-2 puffs into the lungs every 6 (six) hours as needed for wheezing 18 g 5   bisacodyl (DULCOLAX) 5 MG EC tablet Take 1 tablet (5 mg total) by mouth daily as needed for moderate constipation. 30 tablet 0   budesonide-formoterol (SYMBICORT) 80-4.5 MCG/ACT inhaler Inhale 2 puffs into the lungs 2 (two) times daily. 10.2 g 3   buPROPion (WELLBUTRIN XL) 150 MG 24 hr tablet Take 1 tablet (150 mg total) by mouth daily. 30 tablet 5   diclofenac Sodium (VOLTAREN) 1 % GEL Apply 4 g topically 4 (four) times daily as needed. 150 g 0   EPINEPHrine 0.3 mg/0.3 mL IJ SOAJ injection Inject 0.3 mg into the muscle as needed for anaphylaxis. 2 each 0   furosemide (LASIX) 20 MG tablet Take 1 tablet (20 mg total) by mouth daily. 90 tablet 1   gabapentin (NEURONTIN) 100 MG capsule Take 2 capsules (200 mg total) by mouth at bedtime. 60 capsule 5   indomethacin (INDOCIN) 50 MG capsule TAKE 1 CAPSULE BY MOUTH THREE TIMES DAILY WITH MEALS     lidocaine (LIDODERM) 5 % Place 1 patch onto the skin daily. Remove & Discard patch within 12 hours or as directed by MD 30 patch 0   methocarbamol (ROBAXIN) 500 MG tablet Take 1 tablet (500 mg total) by mouth every 6 (six) hours as needed for muscle spasms. 30 tablet 0   montelukast  (SINGULAIR) 10 MG tablet Take 1 tablet (10 mg total) by mouth at bedtime. 90 tablet 1   nebivolol (BYSTOLIC) 2.5 MG tablet Take 1 tablet (2.5 mg total) by mouth daily. 90 tablet 3   olmesartan-hydrochlorothiazide (BENICAR HCT) 20-12.5 MG tablet Take 1 tablet by mouth daily. 90 tablet 1   polyethylene glycol powder (GLYCOLAX/MIRALAX) 17 GM/SCOOP powder Take 17 g by mouth 2 (two) times daily as needed for severe constipation. 116 g 1   traMADol (ULTRAM) 50 MG tablet Take 1 tablet (50 mg total) by mouth every 12 (twelve) hours as needed. 30 tablet 2   No current facility-administered medications on file prior to visit.     Review of Systems  Constitutional:  Negative for fever.  Respiratory:  Positive for cough, chest tightness and shortness of breath (increased - chronic - ? weight gain). Negative for wheezing.   Cardiovascular:  Positive for palpitations (occ) and leg swelling. Negative for chest pain.  Neurological:  Positive for headaches. Negative for light-headedness.       Objective:   Vitals:   01/12/23 1002  BP: 130/78  Pulse: 80  Temp: 98.5 F (36.9 C)  SpO2: 95%   BP Readings from Last 3 Encounters:  01/12/23 130/78  11/21/22 118/78  11/06/22 134/86   Wt  Readings from Last 3 Encounters:  01/12/23 (!) 367 lb (166.5 kg)  11/21/22 (!) 354 lb (160.6 kg)  11/06/22 (!) 347 lb (157.4 kg)   Body mass index is 63 kg/m.    Physical Exam Constitutional:      General: She is not in acute distress.    Appearance: Normal appearance.  HENT:     Head: Normocephalic and atraumatic.  Eyes:     Conjunctiva/sclera: Conjunctivae normal.  Cardiovascular:     Rate and Rhythm: Normal rate and regular rhythm.     Heart sounds: Normal heart sounds.  Pulmonary:     Effort: Pulmonary effort is normal. No respiratory distress.     Breath sounds: Normal breath sounds. No wheezing.  Musculoskeletal:     Cervical back: Neck supple.     Right lower leg: Edema (Trace) present.      Left lower leg: Edema (Mild) present.  Lymphadenopathy:     Cervical: No cervical adenopathy.  Skin:    General: Skin is warm and dry.     Findings: No rash.     Comments: Chronic hyperpigmentation left lower extremity from recurrent cellulitis  Neurological:     Mental Status: She is alert. Mental status is at baseline.  Psychiatric:        Mood and Affect: Mood normal.        Behavior: Behavior normal.        Lab Results  Component Value Date   WBC 15.9 (H) 08/08/2022   HGB 13.7 08/08/2022   HCT 41.9 08/08/2022   PLT 238 08/08/2022   GLUCOSE 96 08/08/2022   CHOL 104 05/14/2019   TRIG 53.0 05/14/2019   HDL 36.00 (L) 05/14/2019   LDLCALC 58 05/14/2019   ALT 13 08/08/2022   AST 17 08/08/2022   NA 137 08/08/2022   K 3.7 08/08/2022   CL 100 08/08/2022   CREATININE 0.84 08/08/2022   BUN 21 (H) 08/08/2022   CO2 28 08/08/2022   TSH 1.040 10/17/2022   HGBA1C 5.4 07/14/2022     Assessment & Plan:    See Problem List for Assessment and Plan of chronic medical problems.

## 2023-01-11 NOTE — Patient Instructions (Addendum)
      Blood work was ordered.   The lab is on the first floor.    Medications changes include :   triamcinolone cream    A referral was ordered healthy weight and wellness and someone will call you to schedule an appointment.     Return in about 6 months (around 07/15/2023) for follow up.

## 2023-01-12 ENCOUNTER — Other Ambulatory Visit (HOSPITAL_BASED_OUTPATIENT_CLINIC_OR_DEPARTMENT_OTHER): Payer: Self-pay

## 2023-01-12 ENCOUNTER — Other Ambulatory Visit: Payer: Self-pay

## 2023-01-12 ENCOUNTER — Ambulatory Visit (INDEPENDENT_AMBULATORY_CARE_PROVIDER_SITE_OTHER): Payer: 59 | Admitting: Internal Medicine

## 2023-01-12 VITALS — BP 130/78 | HR 80 | Temp 98.5°F | Ht 64.0 in | Wt 367.0 lb

## 2023-01-12 DIAGNOSIS — E559 Vitamin D deficiency, unspecified: Secondary | ICD-10-CM | POA: Diagnosis not present

## 2023-01-12 DIAGNOSIS — Z6841 Body Mass Index (BMI) 40.0 and over, adult: Secondary | ICD-10-CM | POA: Diagnosis not present

## 2023-01-12 DIAGNOSIS — I10 Essential (primary) hypertension: Secondary | ICD-10-CM | POA: Diagnosis not present

## 2023-01-12 DIAGNOSIS — R7303 Prediabetes: Secondary | ICD-10-CM | POA: Diagnosis not present

## 2023-01-12 DIAGNOSIS — F3289 Other specified depressive episodes: Secondary | ICD-10-CM

## 2023-01-12 DIAGNOSIS — J4520 Mild intermittent asthma, uncomplicated: Secondary | ICD-10-CM

## 2023-01-12 DIAGNOSIS — R600 Localized edema: Secondary | ICD-10-CM

## 2023-01-12 LAB — COMPREHENSIVE METABOLIC PANEL
ALT: 14 U/L (ref 0–35)
AST: 17 U/L (ref 0–37)
Albumin: 3.7 g/dL (ref 3.5–5.2)
Alkaline Phosphatase: 66 U/L (ref 39–117)
BUN: 16 mg/dL (ref 6–23)
CO2: 31 mEq/L (ref 19–32)
Calcium: 9.3 mg/dL (ref 8.4–10.5)
Chloride: 102 mEq/L (ref 96–112)
Creatinine, Ser: 0.91 mg/dL (ref 0.40–1.20)
GFR: 78.24 mL/min (ref >60.00)
Glucose, Bld: 84 mg/dL (ref 70–99)
Potassium: 4.1 mEq/L (ref 3.5–5.1)
Sodium: 137 mEq/L (ref 135–145)
Total Bilirubin: 0.4 mg/dL (ref 0.2–1.2)
Total Protein: 7.5 g/dL (ref 6.0–8.3)

## 2023-01-12 LAB — CBC WITH DIFFERENTIAL/PLATELET
Basophils Absolute: 0 10*3/uL (ref 0.0–0.1)
Basophils Relative: 0.6 % (ref 0.0–3.0)
Eosinophils Absolute: 0.2 10*3/uL (ref 0.0–0.7)
Eosinophils Relative: 3.5 % (ref 0.0–5.0)
HCT: 38 % (ref 36.0–46.0)
Hemoglobin: 12.3 g/dL (ref 12.0–15.0)
Lymphocytes Relative: 26.2 % (ref 12.0–46.0)
Lymphs Abs: 1.6 10*3/uL (ref 0.7–4.0)
MCHC: 32.4 g/dL (ref 30.0–36.0)
MCV: 95 fl (ref 78.0–100.0)
Monocytes Absolute: 0.7 10*3/uL (ref 0.1–1.0)
Monocytes Relative: 10.5 % (ref 3.0–12.0)
Neutro Abs: 3.7 10*3/uL (ref 1.4–7.7)
Neutrophils Relative %: 59.2 % (ref 43.0–77.0)
Platelets: 248 10*3/uL (ref 150.0–400.0)
RBC: 4 Mil/uL (ref 3.87–5.11)
RDW: 14.9 % (ref 11.5–15.5)
WBC: 6.2 10*3/uL (ref 4.0–10.5)

## 2023-01-12 LAB — LIPID PANEL
Cholesterol: 127 mg/dL (ref 0–200)
HDL: 41.3 mg/dL (ref >39.00)
LDL Cholesterol: 67 mg/dL (ref 0–99)
NonHDL: 85.95
Total CHOL/HDL Ratio: 3
Triglycerides: 94 mg/dL (ref 0.0–149.0)
VLDL: 18.8 mg/dL (ref 0.0–40.0)

## 2023-01-12 LAB — VITAMIN D 25 HYDROXY (VIT D DEFICIENCY, FRACTURES): VITD: 35.55 ng/mL (ref 30.00–100.00)

## 2023-01-12 LAB — HEMOGLOBIN A1C: Hgb A1c MFr Bld: 5.4 % (ref 4.6–6.5)

## 2023-01-12 MED ORDER — TRIAMCINOLONE ACETONIDE 0.1 % EX CREA
1.0000 | TOPICAL_CREAM | Freq: Two times a day (BID) | CUTANEOUS | 5 refills | Status: DC
  Filled 2023-01-12: qty 30, 15d supply, fill #0
  Filled 2023-02-12: qty 30, 15d supply, fill #1
  Filled 2023-03-12: qty 30, 15d supply, fill #2
  Filled 2023-05-10 – 2023-05-23 (×2): qty 30, 15d supply, fill #3
  Filled 2023-07-20: qty 30, 15d supply, fill #4
  Filled 2023-08-06 – 2023-12-31 (×3): qty 30, 15d supply, fill #5

## 2023-01-12 MED ORDER — BUDESONIDE-FORMOTEROL FUMARATE 80-4.5 MCG/ACT IN AERO
2.0000 | INHALATION_SPRAY | Freq: Two times a day (BID) | RESPIRATORY_TRACT | 3 refills | Status: AC
  Filled 2023-01-12 – 2023-05-21 (×2): qty 10.2, 30d supply, fill #0

## 2023-01-12 MED ORDER — NEBIVOLOL HCL 2.5 MG PO TABS
2.5000 mg | ORAL_TABLET | Freq: Every day | ORAL | 5 refills | Status: DC
  Filled 2023-01-12: qty 30, 30d supply, fill #0
  Filled 2023-02-12: qty 30, 30d supply, fill #1
  Filled 2023-03-12: qty 30, 30d supply, fill #2
  Filled 2023-04-17: qty 30, 30d supply, fill #3
  Filled 2023-05-21: qty 30, 30d supply, fill #4
  Filled 2023-06-21: qty 30, 30d supply, fill #5

## 2023-01-12 MED ORDER — GABAPENTIN 100 MG PO CAPS
200.0000 mg | ORAL_CAPSULE | Freq: Every day | ORAL | 5 refills | Status: DC
  Filled 2023-01-12: qty 60, 30d supply, fill #0
  Filled 2023-08-06 – 2023-08-28 (×3): qty 60, 30d supply, fill #1

## 2023-01-12 MED ORDER — ALBUTEROL SULFATE HFA 108 (90 BASE) MCG/ACT IN AERS
1.0000 | INHALATION_SPRAY | Freq: Four times a day (QID) | RESPIRATORY_TRACT | 5 refills | Status: DC | PRN
  Filled 2023-01-12: qty 6.7, 25d supply, fill #0
  Filled 2023-03-12: qty 6.7, 25d supply, fill #1
  Filled 2023-05-10 – 2023-05-23 (×2): qty 6.7, 25d supply, fill #2
  Filled 2023-06-21: qty 6.7, 25d supply, fill #3
  Filled 2023-07-06 – 2023-07-10 (×2): qty 6.7, 25d supply, fill #4
  Filled 2023-09-17: qty 6.7, 25d supply, fill #5

## 2023-01-12 MED ORDER — OLMESARTAN MEDOXOMIL-HCTZ 20-12.5 MG PO TABS
1.0000 | ORAL_TABLET | Freq: Every day | ORAL | 1 refills | Status: DC
  Filled 2023-01-12: qty 30, 30d supply, fill #0
  Filled 2023-02-12: qty 30, 30d supply, fill #1
  Filled 2023-03-12: qty 30, 30d supply, fill #2
  Filled 2023-04-17: qty 30, 30d supply, fill #3
  Filled 2023-05-21: qty 30, 30d supply, fill #4
  Filled 2023-06-21: qty 30, 30d supply, fill #5

## 2023-01-12 MED ORDER — BUPROPION HCL ER (XL) 150 MG PO TB24
150.0000 mg | ORAL_TABLET | Freq: Every day | ORAL | 5 refills | Status: DC
  Filled 2023-01-12: qty 30, 30d supply, fill #0
  Filled 2023-02-12: qty 30, 30d supply, fill #1
  Filled 2023-03-12: qty 30, 30d supply, fill #2
  Filled 2023-04-17: qty 30, 30d supply, fill #3
  Filled 2023-05-21: qty 30, 30d supply, fill #4
  Filled 2023-06-21: qty 30, 30d supply, fill #5

## 2023-01-12 NOTE — Assessment & Plan Note (Signed)
Chronic Somewhat controlled Continue bupropion XL 150 mg daily

## 2023-01-12 NOTE — Assessment & Plan Note (Signed)
Chronic Has gotten a little worse Stressed she needs to cut out the salt Continue to work on weight loss Continue as much exercise as possible Continue HCTZ 12.5 mg daily Continue Lasix 20 mg daily as needed-takes about every other day because she does not tolerate more because of increased urination so we will continue HCTZ daily Discussed that weight loss will help this and so we will decrease in sodium intake

## 2023-01-12 NOTE — Assessment & Plan Note (Addendum)
Chronic Mild, intermittent Increased SOB-knows this could be related to the heat and to her weight gain Has been using albuterol as needed Advise using Symbicort twice daily for now and continue albuterol as needed Continue Singulair 10 mg nightly

## 2023-01-12 NOTE — Assessment & Plan Note (Addendum)
Chronic Weight has increased since she was here last Zepbound is too expensive She is very frustrated-has been trying on her own for a long time and just recently gained weight and what she is doing is not working Encouraged as much exercise as possible Discussed that she needs to decrease her calorie intake-goal around 1200 mg/day Stressed decreasing sodium, sugar and decrease eating too much fruit No fast food Try not to eat help-cooking at home Discussed importance of increasing protein, fiber and vegetables Discussed the only thing that will work long-term his lifestyle changes and consistency with them Referral to healthy weight and wellness

## 2023-01-12 NOTE — Assessment & Plan Note (Signed)
Chronic Check vitamin D level 

## 2023-01-12 NOTE — Assessment & Plan Note (Signed)
Chronic Check a1c Low sugar / carb diet Stressed regular exercise  

## 2023-01-12 NOTE — Assessment & Plan Note (Signed)
Chronic Blood pressure well-controlled CBC, CMP Continue Benicar-HCT 20-12.5 mg daily, Bystolic 2.5 mg daily Taking Lasix on average every other day or few times a week-cannot tolerate this on a regular basis due to increased urination

## 2023-01-17 ENCOUNTER — Encounter: Payer: Self-pay | Admitting: Registered"

## 2023-01-17 ENCOUNTER — Encounter: Payer: 59 | Attending: Internal Medicine | Admitting: Registered"

## 2023-01-17 DIAGNOSIS — Z713 Dietary counseling and surveillance: Secondary | ICD-10-CM | POA: Insufficient documentation

## 2023-01-17 NOTE — Progress Notes (Signed)
Medical Nutrition Therapy  Appointment Start time:  11:06   Appointment End time:  11:58  Primary concerns today: wants to know how to plate her food, what should be included in her meals, nutrition education on food groups  Referral diagnosis: morbid obesity Preferred learning style: no preference indicated Learning readiness: ready   NUTRITION ASSESSMENT   Pt arrives stating her weight has increased by 10 lbs. States she weighed herself. Pt is tearful and states she has been trying to lose weight. States she is applying for a new position at her current job and waiting for an interview soon. States she struggles the most with anxiety. States her appetite is still decreased from previous visit and since change in depression medication. States she had kale crunch salad + 6 nuggets for lunch and unable to eat it all for dinner. States after work she is hungry but too tired to prepare anything and goes to be hungry. States she has been eating vegetables with lunch daily: salad or broccoli or greens/kale or onions and peppers or celery with peanut butter. States she has started eating tomatoes.   States she went to food pantry and received more than enough food including meats, cheeses, milk, fruit, and vegetables. States she still has some food leftover that she's placed in freezer.    Clinical Medical Hx: HTN Medications: See list Labs: within normal limits Notable Signs/Symptoms: fatigue, acid reflux   Lifestyle & Dietary Hx  Estimated daily fluid intake: 128+ oz Supplements: See list Sleep: 5-6 hrs/night Stress / self-care: quiet time, smoke marijuana, getting hair styled Current average weekly physical activity: none  24-Hr Dietary Recall First Meal (10:30 am): omelet with [place] sausage, peppers, swiss cheese + water Snack:  Second Meal (3 pm): [employer] - 6 nuggets + kale salad  Snack:  Third Meal (8-9 pm): [employer] - 4 nuggets + kale salad  Snack:  Beverages: coffee, water  (2.5*32 oz; 80 oz); 80+ oz   NUTRITION DIAGNOSIS  NB-1.1 Food and nutrition-related knowledge deficit As related to having balanced meals.  As evidenced by pt verbalizing lack of previous nutrition-related education.   NUTRITION INTERVENTION  Nutrition education (E-1) on the following topics: Nutrition education and counseling. Pt was encouraged with changes made from previous visit with increased water intake and eating more frequently and consistently. Discussed correlation with medication changes and weight. Reminded of the importance of food security first and then having balanced meals. Reports looking into therapy options via job or insurance. Pt agreed with goals listed.   Handouts Provided Include  Out of the [employer] assistance August schedule  Learning Style & Readiness for Change Teaching method utilized: Visual & Auditory  Demonstrated degree of understanding via: Teach Back  Barriers to learning/adherence to lifestyle change: food insecurity  Goals Established by Pt - Have snack after work such as: Fruit + walnuts  Celery + peanut butter Aim to go to the gym on the weekends for bike ride of least 10-15 minutes each time.    MONITORING & EVALUATION Dietary intake, weekly physical activity.  Next Steps  Patient is to follow-up 8 weeks.

## 2023-01-17 NOTE — Patient Instructions (Addendum)
-   Have snack after work such as: Fruit + walnuts  Celery + peanut butter  - Aim to go to the gym on the weekends for bike ride of least 10-15 minutes each time.

## 2023-01-29 ENCOUNTER — Ambulatory Visit: Payer: 59 | Admitting: Family Medicine

## 2023-01-29 ENCOUNTER — Encounter: Payer: Self-pay | Admitting: Family Medicine

## 2023-01-29 VITALS — BP 136/83 | HR 60 | Temp 98.1°F | Ht 64.0 in | Wt 369.0 lb

## 2023-01-29 DIAGNOSIS — Z0289 Encounter for other administrative examinations: Secondary | ICD-10-CM

## 2023-01-29 DIAGNOSIS — R7303 Prediabetes: Secondary | ICD-10-CM | POA: Diagnosis not present

## 2023-01-29 DIAGNOSIS — I872 Venous insufficiency (chronic) (peripheral): Secondary | ICD-10-CM

## 2023-01-29 DIAGNOSIS — G4733 Obstructive sleep apnea (adult) (pediatric): Secondary | ICD-10-CM | POA: Diagnosis not present

## 2023-01-29 DIAGNOSIS — Z6841 Body Mass Index (BMI) 40.0 and over, adult: Secondary | ICD-10-CM | POA: Diagnosis not present

## 2023-01-29 NOTE — Progress Notes (Signed)
Office: [phone]  /  Fax: [phone]   Initial Visit  Marissa Joseph was seen in clinic today to evaluate for obesity. She is interested in losing weight to improve overall health and reduce the risk of weight related complications. She presents today to review program treatment options, initial physical assessment, and evaluation.     She was referred by: PCP  When asked what else they would like to accomplish? She states: Adopt healthier eating patterns, Improve existing medical conditions, Improve quality of life, and Improve appearance  Weight history: gained over a hundred pounds in the past year.  Has gained weight since having cellulitis in her legs starting 10 years ago.  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 She states: Contributed to medical problems, Having fatigue, and Having poor endurance  Some associated conditions: Hypertension, OSA, Prediabetes, and Other: venous insufficency  Contributing factors: Family history, Disruption of circadian rhythm, Medications, Reduced physical activity, and Mental health problems  Weight promoting medications identified: Psychotropic medications and Steroids  Current nutrition plan: None  Current level of physical activity: NEAT  Current or previous pharmacotherapy: Is interested in pharmacotherapy  Response to medication: Never tried medications   Past medical history includes:   Past Medical History:  Diagnosis Date   Anxiety    no official dx   Asthma    Eczema    Fibroids    GONORRHEA 03/25/2008   Qualifier: Diagnosis of  By: Levon Hedger     Heart murmur    Heart murmur    Hypertension    MRSA (methicillin resistant staph aureus) culture positive 05/2012   cellulits/ left leg   Obesity    Sleep apnea      Objective:   BP 136/83   Pulse 60   Temp 98.1 F (36.7 C)   Ht 5\' 4"  (1.626  m)   Wt (!) 369 lb (167.4 kg)   SpO2 96%   BMI 63.34 kg/m  She was weighed on the bioimpedance scale: Body mass index is 63.34 kg/m.  Peak Weight:269 , Body Fat%:61.9, Visceral Fat Rating:27, Weight trend over the last 12 months: Unchanged  General:  Alert, oriented and cooperative. Patient is in no acute distress.  Respiratory: Normal respiratory effort, no problems with respiration noted   Gait: able to ambulate independently  Mental Status: Normal mood and affect. Normal behavior. Normal judgment and thought content.   DIAGNOSTIC DATA REVIEWED:  BMET    Component Value Date/Time   NA 137 01/12/2023 1107   K 4.1 01/12/2023 1107   CL 102 01/12/2023 1107   CO2 31 01/12/2023 1107   GLUCOSE 84 01/12/2023 1107   BUN 16 01/12/2023 1107   CREATININE 0.91 01/12/2023 1107   CREATININE 0.87 01/23/2020 1554   CALCIUM 9.3 01/12/2023 1107   GFRNONAA >60 08/08/2022 2059   GFRNONAA 85 01/23/2020 1554   GFRAA 98 01/23/2020 1554   Lab Results  Component Value Date   HGBA1C 5.4 01/12/2023   HGBA1C 5.6 08/20/2012   No results found for: "INSULIN" CBC    Component Value Date/Time   WBC 6.2 01/12/2023 1107   RBC 4.00 01/12/2023 1107   HGB 12.3 01/12/2023 1107   HCT 38.0 01/12/2023 1107   PLT 248.0 01/12/2023 1107   MCV 95.0 01/12/2023 1107   MCH 30.6 08/08/2022 2059   MCHC 32.4 01/12/2023 1107   RDW 14.9  01/12/2023 1107   Iron/TIBC/Ferritin/ %Sat No results found for: "IRON", "TIBC", "FERRITIN", "IRONPCTSAT" Lipid Panel     Component Value Date/Time   CHOL 127 01/12/2023 1107   TRIG 94.0 01/12/2023 1107   HDL 41.30 01/12/2023 1107   CHOLHDL 3 01/12/2023 1107   VLDL 18.8 01/12/2023 1107   LDLCALC 67 01/12/2023 1107   Hepatic Function Panel     Component Value Date/Time   PROT 7.5 01/12/2023 1107   ALBUMIN 3.7 01/12/2023 1107   AST 17 01/12/2023 1107   ALT 14 01/12/2023 1107   ALKPHOS 66 01/12/2023 1107   BILITOT 0.4 01/12/2023 1107      Component Value Date/Time    TSH 1.040 10/17/2022 1024   TSH 1.28 06/09/2021 1447     Assessment and Plan:   Obstructive sleep apnea Assessment & Plan: Severe OSA with AHI 121.4, managed by Dr Fannie Knee.  Using BiPap nightly  Aim for 8 hrs of sleep nightly using BiPap Begin active plan for weight reduction   Morbid obesity with BMI of 60.0-69.9, adult (HCC)  Chronic venous insufficiency of lower extremity Assessment & Plan: Pt is troubled by recurrent cellulitis with Chronic venous insufficiency and leg edema.  This has only worsened by weight gain.  We discussed the importance of weight reduction for the treatment of chronic venous insufficiency   Prediabetes Assessment & Plan: Lab Results  Component Value Date   HGBA1C 5.4 01/12/2023   She reports a hx of prediabetes but her last A1c was normal. She does tend to crave sweets and starches  Plan to check fasting insulin with next labs         Obesity Treatment / Action Plan:  Patient will work on garnering support from family and friends to begin weight loss journey. Will work on eliminating or reducing the presence of highly palatable, calorie dense foods in the home. Will complete provided nutritional and psychosocial assessment questionnaire before the next appointment. Will be scheduled for indirect calorimetry to determine resting energy expenditure in a fasting state.  This will allow [place] to create a reduced calorie, high-protein meal plan to promote loss of fat mass while preserving muscle mass. Will think about ideas on how to incorporate physical activity into their daily routine. Counseled on the health benefits of losing 5%-15% of total body weight. Was counseled on nutritional approaches to weight loss and benefits of reducing processed foods and consuming plant-based foods and high quality protein as part of nutritional weight management. Was counseled on pharmacotherapy and role as an adjunct in weight management.   Obesity  Education Performed Today:  She was weighed on the bioimpedance scale and results were discussed and documented in the synopsis.  We discussed obesity as a disease and the importance of a more detailed evaluation of all the factors contributing to the disease.  We discussed the importance of long term lifestyle changes which include nutrition, exercise and behavioral modifications as well as the importance of customizing this to her specific health and social needs.  We discussed the benefits of reaching a healthier weight to alleviate the symptoms of existing conditions and reduce the risks of the biomechanical, metabolic and psychological effects of obesity.  Shelly Bombard Tine appears to be in the action stage of change and states they are ready to start intensive lifestyle modifications and behavioral modifications.  30 minutes was spent today on this visit including the above counseling, pre-visit chart review, and post-visit documentation.  Reviewed by clinician on day of  visit: allergies, medications, problem list, medical history, surgical history, family history, social history, and previous encounter notes pertinent to obesity diagnosis.    Seymour Bars, D.O. DABFM, DABOM [facility] [phone] [place], [place] [address] [phone]

## 2023-01-29 NOTE — Assessment & Plan Note (Signed)
Severe OSA with AHI 121.4, managed by Dr Fannie Knee.  Using BiPap nightly  Aim for 8 hrs of sleep nightly using BiPap Begin active plan for weight reduction

## 2023-01-29 NOTE — Assessment & Plan Note (Signed)
Lab Results  Component Value Date   HGBA1C 5.4 01/12/2023   She reports a hx of prediabetes but her last A1c was normal. She does tend to crave sweets and starches  Plan to check fasting insulin with next labs

## 2023-01-29 NOTE — Assessment & Plan Note (Signed)
Pt is troubled by recurrent cellulitis with Chronic venous insufficiency and leg edema.  This has only worsened by weight gain.  We discussed the importance of weight reduction for the treatment of chronic venous insufficiency

## 2023-02-07 ENCOUNTER — Ambulatory Visit: Payer: 59 | Admitting: Family Medicine

## 2023-02-21 ENCOUNTER — Ambulatory Visit: Payer: 59 | Admitting: Family Medicine

## 2023-03-07 ENCOUNTER — Ambulatory Visit: Payer: 59 | Admitting: Registered"

## 2023-03-08 ENCOUNTER — Ambulatory Visit: Payer: 59 | Admitting: Family Medicine

## 2023-03-08 ENCOUNTER — Encounter: Payer: Self-pay | Admitting: Family Medicine

## 2023-03-08 ENCOUNTER — Ambulatory Visit (HOSPITAL_COMMUNITY)
Admission: RE | Admit: 2023-03-08 | Discharge: 2023-03-08 | Disposition: A | Discharge status: Home / Self Care | Payer: 59 | Source: Ambulatory Visit | Attending: Family Medicine | Admitting: Family Medicine

## 2023-03-08 ENCOUNTER — Other Ambulatory Visit (HOSPITAL_BASED_OUTPATIENT_CLINIC_OR_DEPARTMENT_OTHER): Payer: Self-pay

## 2023-03-08 ENCOUNTER — Ambulatory Visit (INDEPENDENT_AMBULATORY_CARE_PROVIDER_SITE_OTHER): Payer: 59 | Admitting: Family Medicine

## 2023-03-08 ENCOUNTER — Other Ambulatory Visit: Payer: Self-pay

## 2023-03-08 VITALS — BP 139/89 | HR 57 | Temp 97.9°F | Ht 64.0 in | Wt 369.0 lb

## 2023-03-08 DIAGNOSIS — R5383 Other fatigue: Secondary | ICD-10-CM | POA: Insufficient documentation

## 2023-03-08 DIAGNOSIS — Z1331 Encounter for screening for depression: Secondary | ICD-10-CM | POA: Insufficient documentation

## 2023-03-08 DIAGNOSIS — F32A Depression, unspecified: Secondary | ICD-10-CM | POA: Diagnosis not present

## 2023-03-08 DIAGNOSIS — I10 Essential (primary) hypertension: Secondary | ICD-10-CM | POA: Diagnosis not present

## 2023-03-08 DIAGNOSIS — R0602 Shortness of breath: Secondary | ICD-10-CM | POA: Diagnosis not present

## 2023-03-08 DIAGNOSIS — E66813 Obesity, class 3: Secondary | ICD-10-CM | POA: Insufficient documentation

## 2023-03-08 DIAGNOSIS — G4733 Obstructive sleep apnea (adult) (pediatric): Secondary | ICD-10-CM | POA: Diagnosis not present

## 2023-03-08 DIAGNOSIS — Z6841 Body Mass Index (BMI) 40.0 and over, adult: Secondary | ICD-10-CM | POA: Diagnosis not present

## 2023-03-08 DIAGNOSIS — L03116 Cellulitis of left lower limb: Secondary | ICD-10-CM | POA: Diagnosis not present

## 2023-03-08 DIAGNOSIS — F419 Anxiety disorder, unspecified: Secondary | ICD-10-CM | POA: Diagnosis not present

## 2023-03-08 DIAGNOSIS — E559 Vitamin D deficiency, unspecified: Secondary | ICD-10-CM

## 2023-03-08 MED ORDER — VITAMIN D (ERGOCALCIFEROL) 1.25 MG (50000 UNIT) PO CAPS
50000.0000 [IU] | ORAL_CAPSULE | ORAL | 0 refills | Status: DC
Start: 2023-03-08 — End: 2023-03-22
  Filled 2023-03-08: qty 4, 28d supply, fill #0

## 2023-03-09 LAB — INSULIN, RANDOM: INSULIN: 11.7 u[IU]/mL (ref 2.6–24.9)

## 2023-03-09 LAB — FOLATE: Folate: >20 ng/mL (ref >3.0)

## 2023-03-09 LAB — VITAMIN B12: Vitamin B-12: 1169 pg/mL (ref 232–1245)

## 2023-03-12 NOTE — Progress Notes (Unsigned)
Chief Complaint:   OBESITY Marissa Joseph (MR# [id number]) is a 42 y.o. female who presents for evaluation and treatment of obesity and related comorbidities. Current BMI is Body mass index is 63.34 kg/m. Marissa Joseph has been struggling with her weight for many years and has been unsuccessful in either losing weight, maintaining weight loss, or reaching her healthy weight goal.  Marissa Joseph is currently in the action stage of change and ready to dedicate time achieving and maintaining a healthier weight. Marissa Joseph is interested in becoming our patient and working on intensive lifestyle modifications including (but not limited to) diet and exercise for weight loss.  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 She is single and she lives alone. She averages 1,200 steps a day. Her leg and knee pain limits walking. She drinks sweet tea and soda, and she is a stress eater.   Marissa Joseph's habits were reviewed today and are as follows: she thinks her family will eat healthier with her, her desired weight loss is 169 lbs, she has been heavy most of her life, she started gaining excessive weight at 42 years old, her heaviest weight ever was 376 pounds, she has significant food cravings issues, she snacks frequently in the evenings, she skips meals frequently, she is frequently drinking liquids with calories, she frequently makes poor food choices, and she struggles with emotional eating.  Depression Screen Marissa Joseph's Food and Mood (modified PHQ-9) score was 19.  Subjective:   1. Other fatigue Marissa Joseph admits to daytime somnolence and admits to waking up still tired. Patient has a history of symptoms of daytime fatigue, morning fatigue, and morning headache. Marissa Joseph generally gets 5 hours of sleep per night, and states that she has nightime awakenings. Snoring is present. Apneic episodes are present. Epworth Sleepiness Score is 10. EKG-normal sinus rhythm at 68 BPM.   2. SOBOE (shortness of breath on  exertion) Marissa Joseph notes increasing shortness of breath with exercising and seems to be worsening over time with weight gain. She notes getting out of breath sooner with activity than she used to. This has not gotten worse recently. Marissa Joseph denies shortness of breath at rest or orthopnea.  3. OSA treated with BiPAP Patient has severe OSA with AHI 121.4, and she is managed by Dr. Fannie Knee. Her Epworth score is 10. She is not wearing BiPAP.   4. Essential hypertension Patient's blood pressure is controlled on Nebivolol 2.5 once daily and olmesartan-hydrochlorothiazide 20-12.5 mg once weekly.   5. Recurrent Cellulitis of left lower extremity Patient is with bilateral leg edema, Left>Right.   6. Vitamin D deficiency Patient's Vitamin D level was 35.55 on 01/12/2023. She is taking OTC Vitamin D 2,000 IU gummies daily.   7. Anxiety and depression Patient is on Wellbutrin XL 150 mg once daily. She notes her stress level is high at work. Her bariatric PHQ-9 score is 19.  Assessment/Plan:   1. Other fatigue Marissa Joseph does feel that her weight is causing her energy to be lower than it should be. Fatigue may be related to obesity, depression or many other causes. Labs will be ordered, and in the meanwhile, Marissa Joseph will focus on self care including making healthy food choices, increasing physical activity and focusing on stress reduction.  - EKG 12-Lead - Insulin, random - Folate - Vitamin B12  2. SOBOE (shortness of breath on exertion) Marissa Joseph does feel that she gets out of breath more easily that she used to when she exercises. Marissa Joseph's shortness  of breath appears to be obesity related and exercise induced. She has agreed to work on weight loss and gradually increase exercise to treat her exercise induced shortness of breath. Will continue to monitor closely.  3. OSA treated with BiPAP Patient was referred to Dr. Jerre Simon for evaluation and treatment.   - Ambulatory referral to Pulmonology  4.  Essential hypertension Patient is to look for improvements in blood pressure with weight loss.   5. Recurrent Cellulitis of left lower extremity Anticipate improvements in lower extremity edema with weight loss.   6. Vitamin D deficiency Patient is to change to prescription Vitamin D 50,000 IU once weekly with no refills. We will repeat labs in 3-4 months.   - Vitamin D, Ergocalciferol, (DRISDOL) 1.25 MG (50000 UNIT) CAPS capsule; Take 1 capsule (50,000 Units total) by mouth every 7 (seven) days.  Dispense: 5 capsule; Refill: 0  7. Anxiety and depression Patient is to work on stress reduction and mindful eating.   8. Depression screen Marissa Joseph had a positive depression screening. Depression is commonly associated with obesity and often results in emotional eating behaviors. We will monitor this closely and work on CBT to help improve the non-hunger eating patterns. Referral to Psychology may be required if no improvement is seen as she continues in our clinic.  9. BMI 60.0-69.9, adult (HCC)  10. Morbid obesity with starting BMI 63.4 Marissa Joseph is currently in the action stage of change and her goal is to continue with weight loss efforts. I recommend Marissa Joseph begin the structured treatment plan as follows:  She has agreed to the Category 3 Plan.  100-calorie snack list was given. Can swap out Fairlife milk for a protein shake to mix in her coffee.   Exercise goals: All adults should avoid inactivity. Some physical activity is better than none, and adults who participate in any amount of physical activity gain some health benefits.   Behavioral modification strategies: increasing lean protein intake, increasing vegetables, increasing water intake, decreasing liquid calories, decreasing eating out, no skipping meals, meal planning and cooking strategies, keeping healthy foods in the home, better snacking choices, avoiding temptations, and planning for success.  She was informed of the importance  of frequent follow-up visits to maximize her success with intensive lifestyle modifications for her multiple health conditions. She was informed we would discuss her lab results at her next visit unless there is a critical issue that needs to be addressed sooner. Marissa Joseph agreed to keep her next visit at the agreed upon time to discuss these results.  Objective:   Blood pressure 139/89, pulse (!) 57, temperature 97.9 F (36.6 C), height 5\' 4"  (1.626 m), weight (!) 369 lb (167.4 kg), SpO2 99%. Body mass index is 63.34 kg/m.  EKG: Normal sinus rhythm, rate 68 BPM.  Indirect Calorimeter completed today shows a VO2 of 325 and a REE of 2246.  Her calculated basal metabolic rate is 6045 thus her basal metabolic rate is better than expected.  General: Cooperative, alert, well developed, in no acute distress. HEENT: Conjunctivae and lids unremarkable. Cardiovascular: Regular rhythm.  Lungs: Normal work of breathing. Neurologic: No focal deficits.  Ambulates with a cane.  Lab Results  Component Value Date   CREATININE 0.91 01/12/2023   BUN 16 01/12/2023   NA 137 01/12/2023   K 4.1 01/12/2023   CL 102 01/12/2023   CO2 31 01/12/2023   Lab Results  Component Value Date   ALT 14 01/12/2023   AST 17 01/12/2023   ALKPHOS  66 01/12/2023   BILITOT 0.4 01/12/2023   Lab Results  Component Value Date   HGBA1C 5.4 01/12/2023   HGBA1C 5.4 07/14/2022   HGBA1C 5.3 02/14/2021   HGBA1C 5.3 07/28/2020   HGBA1C 5.2 05/14/2019   Lab Results  Component Value Date   INSULIN 11.7 03/08/2023   Lab Results  Component Value Date   TSH 1.040 10/17/2022   Lab Results  Component Value Date   CHOL 127 01/12/2023   HDL 41.30 01/12/2023   LDLCALC 67 01/12/2023   TRIG 94.0 01/12/2023   CHOLHDL 3 01/12/2023   Lab Results  Component Value Date   WBC 6.2 01/12/2023   HGB 12.3 01/12/2023   HCT 38.0 01/12/2023   MCV 95.0 01/12/2023   PLT 248.0 01/12/2023   No results found for: "IRON", "TIBC",  "FERRITIN"  Attestation Statements:   Reviewed by clinician on day of visit: allergies, medications, problem list, medical history, surgical history, family history, social history, and previous encounter notes.  Time spent on visit including pre-visit chart review and post-visit charting and care was 40 minutes.   Trude Mcburney, am acting as transcriptionist for Seymour Bars, DO.  I have reviewed the above documentation for accuracy and completeness, and I agree with the above. Seymour Bars, DO

## 2023-03-13 ENCOUNTER — Telehealth: Payer: Self-pay | Admitting: *Deleted

## 2023-03-13 NOTE — Telephone Encounter (Signed)
Referral information sent to Dr.Adnan Javaid office for them to schedule patient. Patient has been notified.

## 2023-03-15 ENCOUNTER — Other Ambulatory Visit (HOSPITAL_BASED_OUTPATIENT_CLINIC_OR_DEPARTMENT_OTHER): Payer: Self-pay

## 2023-03-19 ENCOUNTER — Other Ambulatory Visit: Payer: Self-pay

## 2023-03-19 ENCOUNTER — Encounter (HOSPITAL_BASED_OUTPATIENT_CLINIC_OR_DEPARTMENT_OTHER): Payer: Self-pay

## 2023-03-19 ENCOUNTER — Emergency Department (HOSPITAL_BASED_OUTPATIENT_CLINIC_OR_DEPARTMENT_OTHER)
Admission: EM | Admit: 2023-03-19 | Discharge: 2023-03-19 | Disposition: A | Discharge status: Home / Self Care | Payer: 59 | Attending: Emergency Medicine | Admitting: Emergency Medicine

## 2023-03-19 DIAGNOSIS — L03116 Cellulitis of left lower limb: Secondary | ICD-10-CM | POA: Diagnosis not present

## 2023-03-19 MED ORDER — DOXYCYCLINE HYCLATE 100 MG PO CAPS
100.0000 mg | ORAL_CAPSULE | Freq: Two times a day (BID) | ORAL | 0 refills | Status: DC
  Filled 2023-03-19 – 2023-03-20 (×2): qty 20, 10d supply, fill #0

## 2023-03-19 MED ORDER — DOXYCYCLINE HYCLATE 100 MG PO TABS
100.0000 mg | ORAL_TABLET | Freq: Once | ORAL | Status: AC
  Administered 2023-03-19: 100 mg via ORAL
  Filled 2023-03-19: qty 1

## 2023-03-19 NOTE — ED Triage Notes (Signed)
Pt noticed today at 5pm that her left LE was red & slightly swollen.  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   Possible early cellulitis.  States she has had cellulitis before in that leg.

## 2023-03-19 NOTE — ED Provider Notes (Signed)
[facility] AT [facility]  Provider Note  CSN: [id number] Arrival date & time: 03/19/23 1944  History Chief Complaint  Patient presents with   Leg Pain    Marissa Joseph is a 42 y.o. female with history of eczema and recurrent cellulitis of LLE reports increasing redness and pain to that same area starting earlier today. No fevers. No drainage.    Home Medications Prior to Admission medications   Medication Sig Start Date End Date Taking? Authorizing Provider  doxycycline (VIBRAMYCIN) 100 MG capsule Take 1 capsule (100 mg total) by mouth 2 (two) times daily. 03/19/23  Yes Pollyann Savoy, MD  albuterol (VENTOLIN HFA) 108 (90 Base) MCG/ACT inhaler Inhale 1-2 puffs into the lungs every 6 (six) hours as needed for wheezing 01/12/23   Pincus Sanes, MD  bisacodyl (DULCOLAX) 5 MG EC tablet Take 1 tablet (5 mg total) by mouth daily as needed for moderate constipation. 02/04/20   Pincus Sanes, MD  budesonide-formoterol (SYMBICORT) 80-4.5 MCG/ACT inhaler Inhale 2 puffs into the lungs 2 (two) times daily. 01/12/23   Pincus Sanes, MD  buPROPion (WELLBUTRIN XL) 150 MG 24 hr tablet Take 1 tablet (150 mg total) by mouth daily. 01/12/23   Pincus Sanes, MD  diclofenac Sodium (VOLTAREN) 1 % GEL Apply 4 g topically 4 (four) times daily as needed. 07/17/19   Merrilee Jansky, MD  EPINEPHrine 0.3 mg/0.3 mL IJ SOAJ injection Inject 0.3 mg into the muscle as needed for anaphylaxis. 11/21/22   Pincus Sanes, MD  furosemide (LASIX) 20 MG tablet Take 1 tablet (20 mg total) by mouth daily. 07/14/22   Pincus Sanes, MD  gabapentin (NEURONTIN) 100 MG capsule Take 2 capsules (200 mg total) by mouth at bedtime. 01/12/23   Pincus Sanes, MD  indomethacin (INDOCIN) 50 MG capsule TAKE 1 CAPSULE BY MOUTH THREE TIMES DAILY WITH MEALS 11/10/20   [provider]  lidocaine (LIDODERM) 5 % Place 1 patch onto the skin daily. Remove & Discard patch within 12 hours or as directed by MD  03/05/22   Ernie Avena, MD  methocarbamol (ROBAXIN) 500 MG tablet Take 1 tablet (500 mg total) by mouth every 6 (six) hours as needed for muscle spasms. 03/08/22   Burns, Bobette Mo, MD  montelukast (SINGULAIR) 10 MG tablet Take 1 tablet (10 mg total) by mouth at bedtime. 11/25/21   Pincus Sanes, MD  nebivolol (BYSTOLIC) 2.5 MG tablet Take 1 tablet (2.5 mg total) by mouth daily. 01/12/23   Pincus Sanes, MD  olmesartan-hydrochlorothiazide (BENICAR HCT) 20-12.5 MG tablet Take 1 tablet by mouth daily. 01/12/23   Pincus Sanes, MD  polyethylene glycol powder (GLYCOLAX/MIRALAX) 17 GM/SCOOP powder Take 17 g by mouth 2 (two) times daily as needed for severe constipation. 02/04/20   Pincus Sanes, MD  traMADol (ULTRAM) 50 MG tablet Take 1 tablet (50 mg total) by mouth every 12 (twelve) hours as needed. 08/09/21   Cristie Hem, PA-C  triamcinolone cream (KENALOG) 0.1 % Apply 1 Application topically 2 (two) times daily. 01/12/23   Pincus Sanes, MD  Vitamin D, Ergocalciferol, (DRISDOL) 1.25 MG (50000 UNIT) CAPS capsule Take 1 capsule (50,000 Units total) by mouth every 7 (seven) days. 03/08/23   Bowen, Scot Jun, DO     Allergies    Avocado and Shellfish allergy   Review of Systems   Review of Systems Please see HPI for pertinent positives and negatives  Physical  Exam BP (!) 146/89   Pulse 79   Temp 98 F (36.7 C)   Resp 20   Ht 5\' 4"  (1.626 m)   Wt (!) 167.8 kg   LMP 12/23/2022 (Exact Date)   SpO2 93%   BMI 63.51 kg/m   Physical Exam Vitals and nursing note reviewed.  HENT:     Head: Normocephalic.     Nose: Nose normal.  Eyes:     Extraocular Movements: Extraocular movements intact.  Pulmonary:     Effort: Pulmonary effort is normal.  Musculoskeletal:        General: Tenderness (L posterior lower leg, some erythema and induration surrounding eczema patch) present. Normal range of motion.     Cervical back: Neck supple.     Right lower leg: No edema.     Left lower leg: Edema present.   Skin:    Findings: No rash (on exposed skin).  Neurological:     Mental Status: She is alert and oriented to person, place, and time.  Psychiatric:        Mood and Affect: Mood normal.     ED Results / Procedures / Treatments   EKG None  Procedures Procedures  Medications Ordered in the ED Medications  doxycycline (VIBRA-TABS) tablet 100 mg (has no administration in time range)    Initial Impression and Plan  Patient here with likely recurrence of cellulitis on LLE, vitals are reassuring, no signs of systemic infection. She has responded well to doxycycline in the past. Will give a course of same, recommend PCP follow up, RTED for any other concerns.    ED Course       MDM Rules/Calculators/A&P Medical Decision Making Problems Addressed: Cellulitis of left lower extremity: acute illness or injury  Risk Prescription drug management.     Final Clinical Impression(s) / ED Diagnoses Final diagnoses:  Cellulitis of left lower extremity    Rx / DC Orders ED Discharge Orders          Ordered    doxycycline (VIBRAMYCIN) 100 MG capsule  2 times daily        03/19/23 2316             Pollyann Savoy, MD 03/19/23 2316

## 2023-03-20 ENCOUNTER — Other Ambulatory Visit (HOSPITAL_BASED_OUTPATIENT_CLINIC_OR_DEPARTMENT_OTHER): Payer: Self-pay

## 2023-03-20 ENCOUNTER — Other Ambulatory Visit: Payer: Self-pay

## 2023-03-22 ENCOUNTER — Other Ambulatory Visit (HOSPITAL_BASED_OUTPATIENT_CLINIC_OR_DEPARTMENT_OTHER): Payer: Self-pay

## 2023-03-22 ENCOUNTER — Ambulatory Visit (INDEPENDENT_AMBULATORY_CARE_PROVIDER_SITE_OTHER): Payer: 59 | Admitting: Family Medicine

## 2023-03-22 ENCOUNTER — Encounter: Payer: Self-pay | Admitting: Family Medicine

## 2023-03-22 VITALS — BP 137/84 | HR 79 | Temp 97.5°F | Ht 64.0 in | Wt 365.0 lb

## 2023-03-22 DIAGNOSIS — E559 Vitamin D deficiency, unspecified: Secondary | ICD-10-CM | POA: Diagnosis not present

## 2023-03-22 DIAGNOSIS — E66813 Obesity, class 3: Secondary | ICD-10-CM | POA: Diagnosis not present

## 2023-03-22 DIAGNOSIS — E662 Morbid (severe) obesity with alveolar hypoventilation: Secondary | ICD-10-CM

## 2023-03-22 DIAGNOSIS — Z6841 Body Mass Index (BMI) 40.0 and over, adult: Secondary | ICD-10-CM | POA: Diagnosis not present

## 2023-03-22 DIAGNOSIS — R632 Polyphagia: Secondary | ICD-10-CM

## 2023-03-22 DIAGNOSIS — G4733 Obstructive sleep apnea (adult) (pediatric): Secondary | ICD-10-CM

## 2023-03-22 MED ORDER — VITAMIN D (ERGOCALCIFEROL) 1.25 MG (50000 UNIT) PO CAPS
50000.0000 [IU] | ORAL_CAPSULE | ORAL | 0 refills | Status: DC
  Filled 2023-03-22: qty 5, 35d supply, fill #0

## 2023-03-22 NOTE — Assessment & Plan Note (Signed)
Patient has previously had severe OSA with an apnea-hypopnea index of 121 from October 2008.  She reports never starting BiPAP due to insurance reasons.  We discussed that untreated OSA may hinder her weight loss and also contribute to daytime fatigue.  She is actively working on weight reduction.  A referral was placed to lung and sleep wellness for repeat polysomnogram and treatment of OSA

## 2023-03-22 NOTE — Progress Notes (Signed)
Office: [phone]  /  Fax: [phone]  WEIGHT SUMMARY AND BIOMETRICS  Starting Date: 03/08/23  Starting Weight: 369lb   Weight Lost Since Last Visit: 4lb   Vitals Temp: (!) 97.5 F (36.4 C) BP: 137/84 Pulse Rate: 79 SpO2: 97 %   Body Composition  Body Fat %: 61.4 % Fat Mass (lbs): 224.4 lbs Muscle Mass (lbs): 134 lbs Visceral Fat Rating : 27     HPI  Chief Complaint: OBESITY  Marissa Joseph is here to discuss her progress with her obesity treatment plan. She is on the the Category 3 Plan and states she is following her eating plan approximately 90 % of the time. She states she is exercising 0 minutes 0 times per week.   Interval History:  Since last office visit she is down 4 lb She is down 2.8 lb of muscle and down 1 lb of body fat She plans to check out swimming/ water exercise options She has a good support system She is not wearing a BiPap  for severe OSA due to insurance reasons Hunger and cravings are under good control She will be traveling next week  Pharmacotherapy: None  PHYSICAL EXAM:  Blood pressure 137/84, pulse 79, temperature (!) 97.5 F (36.4 C), height 5\' 4"  (1.626 m), weight (!) 365 lb (165.6 kg), last menstrual period 12/23/2022, SpO2 97%. Body mass index is 62.65 kg/m.  General: She is overweight, cooperative, alert, well developed, and in no acute distress. PSYCH: Has normal mood, affect and thought process.   Lungs: Normal breathing effort, no conversational dyspnea.   ASSESSMENT AND PLAN  TREATMENT PLAN FOR OBESITY:  Recommended Dietary Goals  Marissa Joseph is currently in the action stage of change. As such, her goal is to continue weight management plan. She has agreed to the Category 3 Plan. -She may expand her fruit to 2 servings per day, before 4 PM -She may add in one quarter plate of a high-fiber carbohydrate as reviewed together on after visit summary with dinner -Reviewed other high-protein, high-fiber snack  options  Behavioral Intervention  We discussed the following Behavioral Modification Strategies today: increasing lean protein intake to established goals, decreasing simple carbohydrates , increasing vegetables, increasing lower glycemic fruits, increasing fiber rich foods, avoiding skipping meals, increasing water intake , work on meal planning and preparation, keeping healthy foods at home, avoiding temptations and identifying enticing environmental cues, planning for success, and continue to work on maintaining a reduced calorie state, getting the recommended amount of protein, incorporating whole foods, making healthy choices, staying well hydrated and practicing mindfulness when eating..  Additional resources provided today: NA  Recommended Physical Activity Goals  Marissa Joseph has been advised to work up to 150 minutes of moderate intensity aerobic activity a week and strengthening exercises 2-3 times per week for cardiovascular health, weight loss maintenance and preservation of muscle mass.   She has agreed to Patient will begin to exercise begin water exercise at least once a week  Pharmacotherapy changes for the treatment of obesity: None  ASSOCIATED CONDITIONS ADDRESSED TODAY  OSA (obstructive sleep apnea) -     Ambulatory referral to Pulmonology  Vitamin D deficiency Assessment & Plan: Last vitamin D Lab Results  Component Value Date   VD25OH 35.55 01/12/2023   She has started on vitamin D 50,000 IU once weekly after her last visit.  Her energy level remains fairly low.  We discussed a vitamin D level of over 50.  Continue vitamin D 50,000 IU once weekly and plan to  recheck level in 3 months  Orders: -     Vitamin D (Ergocalciferol); Take 1 capsule (50,000 Units total) by mouth every 7 (seven) days.  Dispense: 5 capsule; Refill: 0  Class 3 obesity with alveolar hypoventilation, serious comorbidity, and body mass index (BMI) of 60.0 to 69.9 in adult  [facility])  Polyphagia Assessment & Plan: Improving.  Patient has noticed improved appetite and cravings with eating on a schedule, increasing lean protein and fiber with meals and increasing water intake.  Will hold off on adding any antiobesity medications at this point in time since hunger and cravings are under good control. Continue to aim for a target goal of 110 or more grams of protein daily.  We discussed high-fiber carbohydrate options.   Obstructive sleep apnea Assessment & Plan: Patient has previously had severe OSA with an apnea-hypopnea index of 121 from October 2008.  She reports never starting BiPAP due to insurance reasons.  We discussed that untreated OSA may hinder her weight loss and also contribute to daytime fatigue.  She is actively working on weight reduction.  A referral was placed to [facility] sleep wellness for repeat polysomnogram and treatment of OSA       She was informed of the importance of frequent follow up visits to maximize her success with intensive lifestyle modifications for her multiple health conditions.   ATTESTASTION STATEMENTS:  Reviewed by clinician on day of visit: allergies, medications, problem list, medical history, surgical history, family history, social history, and previous encounter notes pertinent to obesity diagnosis.   I have personally spent 30 minutes total time today in preparation, patient care, nutritional counseling and documentation for this visit, including the following: review of clinical lab tests; review of medical tests/procedures/services.      Marissa Brink, DO DABFM, DABOM [facility] [address] [place], [place] [address] [phone]

## 2023-03-22 NOTE — Assessment & Plan Note (Signed)
Improving.  Patient has noticed improved appetite and cravings with eating on a schedule, increasing lean protein and fiber with meals and increasing water intake.  Will hold off on adding any antiobesity medications at this point in time since hunger and cravings are under good control. Continue to aim for a target goal of 110 or more grams of protein daily.  We discussed high-fiber carbohydrate options.

## 2023-03-22 NOTE — Assessment & Plan Note (Signed)
Last vitamin D Lab Results  Component Value Date   VD25OH 35.55 01/12/2023   She has started on vitamin D 50,000 IU once weekly after her last visit.  Her energy level remains fairly low.  We discussed a vitamin D level of over 50.  Continue vitamin D 50,000 IU once weekly and plan to recheck level in 3 months

## 2023-03-22 NOTE — Patient Instructions (Addendum)
You can expand fresh fruit to 2 servings per day (ANY)  You can add 1/4 plate of high fiber CARB to dinner - keep the lean protein (meat) + non starchy veggies Sweet potato Baked potato Chickpeas Beans Low carb tortilla Barilla protein pasta/ Banza pasta Rice or qunina Keto bun  For crunch fix Try out seaweed, carrots, celery, Quest chips Low carb tortilla air fry with avocado oil spray  + high protein Queso  Check out swimming options Keep drinks sugar free

## 2023-04-12 ENCOUNTER — Telehealth (INDEPENDENT_AMBULATORY_CARE_PROVIDER_SITE_OTHER): Payer: Self-pay

## 2023-04-12 ENCOUNTER — Encounter: Payer: Self-pay | Admitting: Family Medicine

## 2023-04-12 ENCOUNTER — Other Ambulatory Visit (HOSPITAL_BASED_OUTPATIENT_CLINIC_OR_DEPARTMENT_OTHER): Payer: Self-pay

## 2023-04-12 ENCOUNTER — Ambulatory Visit (INDEPENDENT_AMBULATORY_CARE_PROVIDER_SITE_OTHER): Payer: 59 | Admitting: Family Medicine

## 2023-04-12 VITALS — BP 128/84 | HR 73 | Temp 97.9°F | Ht 64.0 in | Wt 369.0 lb

## 2023-04-12 DIAGNOSIS — E66813 Obesity, class 3: Secondary | ICD-10-CM | POA: Diagnosis not present

## 2023-04-12 DIAGNOSIS — K581 Irritable bowel syndrome with constipation: Secondary | ICD-10-CM | POA: Diagnosis not present

## 2023-04-12 DIAGNOSIS — G4733 Obstructive sleep apnea (adult) (pediatric): Secondary | ICD-10-CM | POA: Diagnosis not present

## 2023-04-12 DIAGNOSIS — E559 Vitamin D deficiency, unspecified: Secondary | ICD-10-CM | POA: Diagnosis not present

## 2023-04-12 DIAGNOSIS — Z6841 Body Mass Index (BMI) 40.0 and over, adult: Secondary | ICD-10-CM

## 2023-04-12 MED ORDER — VITAMIN D (ERGOCALCIFEROL) 1.25 MG (50000 UNIT) PO CAPS
50000.0000 [IU] | ORAL_CAPSULE | ORAL | 0 refills | Status: DC
  Filled 2023-04-12: qty 4, 28d supply, fill #0
  Filled 2023-05-10: qty 4, 28d supply, fill #1
  Filled 2023-05-10: qty 1, 7d supply, fill #1

## 2023-04-12 MED ORDER — LOMAIRA 8 MG PO TABS
8.0000 mg | ORAL_TABLET | Freq: Two times a day (BID) | ORAL | 0 refills | Status: DC
  Filled 2023-04-12: qty 60, 30d supply, fill #0

## 2023-04-12 NOTE — Patient Instructions (Addendum)
If you'd like to track on the MyFitnessPal ap Aim for 1600 cal/ day This should include 120 g + / day  Start on Lomaira 8 mg tab (low dose Phentermine) Take 1 tab 30 min before 1st meal and the 2nd tab 1 hr after 2nd meal  Limit snacking (only if hungry) Remember fruits and veggies  Aim for 7 hrs of sleep at night

## 2023-04-12 NOTE — Assessment & Plan Note (Signed)
Worsening She notes a hx of IBS-C worse lately, possibly due to higher protein diet Working on increasing intake of water, fruits and veggies Has miralax at home, not taking daily.  Increase water intake to 100 oz/ day Allow more non starchy veggies Take Miralax daily until constipation improves

## 2023-04-12 NOTE — Telephone Encounter (Signed)
See my chart message

## 2023-04-12 NOTE — Assessment & Plan Note (Signed)
Untreated.  Referral to Dr Jerre Simon made last visit but patient has not yet been scheduled.  She has severe OSA with an AHI of 121 not on CPAP or BiPap.   Will call to reschedule and if not will move her referral to [place]. Continue active plan for weight loss

## 2023-04-12 NOTE — Progress Notes (Signed)
Office: [phone]  /  Fax: [phone]  WEIGHT SUMMARY AND BIOMETRICS  Starting Date: 03/08/23  Starting Weight: 369lb   Weight Lost Since Last Visit: 0lb   Vitals Temp: 97.9 F (36.6 C) BP: 128/84 Pulse Rate: 73 SpO2: 97 %   Body Composition  Body Fat %: 62 % Fat Mass (lbs): 228.8 lbs Muscle Mass (lbs): 133.2 lbs Visceral Fat Rating : 27     HPI  Chief Complaint: OBESITY  Marissa Joseph is here to discuss her progress with her obesity treatment plan. She is on the the Category 3 Plan and states she is following her eating plan approximately 50 % of the time. She states she is exercising 0 minutes 0 times per week.   Interval History:  Since last office visit she is up 4 lb She has a net weight loss of 0 lb in the past month of medically supervised weight management She works 2nd shift but has really be working on [employer], bringing food to work Has tendency to over snack at work Mindful of protein and fiber intake with meals Has increased water intake Has never used AOMs before Has not yet seen pulm for untreated severe OSA  Pharmacotherapy: none  PHYSICAL EXAM:  Blood pressure 128/84, pulse 73, temperature 97.9 F (36.6 C), height 5\' 4"  (1.626 m), weight (!) 369 lb (167.4 kg), last menstrual period 12/23/2022, SpO2 97%. Body mass index is 63.34 kg/m.  General: She is overweight, cooperative, alert, well developed, and in no acute distress. PSYCH: Has normal mood, affect and thought process.   Lungs: Normal breathing effort, no conversational dyspnea.   ASSESSMENT AND PLAN  TREATMENT PLAN FOR OBESITY:  Recommended Dietary Goals  Marissa Joseph is currently in the action stage of change. As such, her goal is to continue weight management plan. She has agreed to the Category 3 Plan and keeping a food journal and adhering to recommended goals of 1600 calories and 120+ g of  protein.  Behavioral Intervention  We discussed the following Behavioral  Modification Strategies today: increasing lean protein intake to established goals, decreasing simple carbohydrates , increasing water intake , work on meal planning and preparation, work on tracking and journaling calories using tracking application, identifying sources and decreasing liquid calories, practice mindfulness eating and understand the difference between hunger signals and cravings, work on managing stress, creating time for self-care and relaxation, avoiding temptations and identifying enticing environmental cues, and continue to work on maintaining a reduced calorie state, getting the recommended amount of protein, incorporating whole foods, making healthy choices, staying well hydrated and practicing mindfulness when eating..  Additional resources provided today: NA  Recommended Physical Activity Goals  Marissa Joseph has been advised to work up to 150 minutes of moderate intensity aerobic activity a week and strengthening exercises 2-3 times per week for cardiovascular health, weight loss maintenance and preservation of muscle mass.   She has agreed to Think about enjoyable ways to increase daily physical activity and overcoming barriers to exercise and Increase physical activity in their day and reduce sedentary time (increase NEAT).  Pharmacotherapy changes for the treatment of obesity: begin Lomaira 8 mg tab po BID.  Informed consent signed.  PDMP reviewed.  Reviewed MOA and potential adverse SE.  Avoid pregnancy while on Lomaira.    ASSOCIATED CONDITIONS ADDRESSED TODAY  Irritable bowel syndrome with constipation Assessment & Plan: Worsening She notes a hx of IBS-C worse lately, possibly due to higher protein diet Working on increasing intake of water, fruits and  veggies Has miralax at home, not taking daily.  Increase water intake to 100 oz/ day Allow more non starchy veggies Take Miralax daily until constipation improves   Vitamin D deficiency Assessment & Plan: Last  vitamin D Lab Results  Component Value Date   VD25OH 35.55 01/12/2023   Taking RX vitamin D weekly Energy level unchanged  Repeat lab in Jan  Orders: -     Vitamin D (Ergocalciferol); Take 1 capsule (50,000 Units total) by mouth every 7 (seven) days.  Dispense: 5 capsule; Refill: 0  OSA (obstructive sleep apnea) Assessment & Plan: Untreated.  Referral to Dr Jerre Simon made last visit but patient has not yet been scheduled.  She has severe OSA with an AHI of 121 not on CPAP or BiPap.   Will call to reschedule and if not will move her referral to [place]. Continue active plan for weight loss   Class 3 severe obesity due to excess calories with serious comorbidity and body mass index (BMI) of 60.0 to 69.9 in adult (HCC) -     Lomaira; Take 1 tablet (8 mg total) by mouth 2 (two) times daily. Take 30 minutes before first meal of the day and 1 hour after 2nd meal of the day.  Dispense: 60 tablet; Refill: 0      She was informed of the importance of frequent follow up visits to maximize her success with intensive lifestyle modifications for her multiple health conditions.   ATTESTASTION STATEMENTS:  Reviewed by clinician on day of visit: allergies, medications, problem list, medical history, surgical history, family history, social history, and previous encounter notes pertinent to obesity diagnosis.   I have personally spent 30 minutes total time today in preparation, patient care, nutritional counseling and documentation for this visit, including the following: review of clinical lab tests; review of medical tests/procedures/services.      Glennis Brink, DO DABFM, DABOM [facility] [address] [place], [place] [address] [phone]

## 2023-04-12 NOTE — Assessment & Plan Note (Signed)
Last vitamin D Lab Results  Component Value Date   VD25OH 35.55 01/12/2023   Taking RX vitamin D weekly Energy level unchanged  Repeat lab in Jan

## 2023-04-17 ENCOUNTER — Other Ambulatory Visit (HOSPITAL_BASED_OUTPATIENT_CLINIC_OR_DEPARTMENT_OTHER): Payer: Self-pay

## 2023-04-19 ENCOUNTER — Telehealth: Payer: Self-pay

## 2023-04-19 NOTE — Telephone Encounter (Signed)
Transition Care Management Unsuccessful Follow-up Telephone Call  Date of discharge and from where:  03/19/2023 [facility]  Attempts:  1st Attempt  Reason for unsuccessful TCM follow-up call:  Left voice message  Tiaira Arambula [facility]  [facility], [facility] Guide Direct Dial: [phone]  Website: [email or web address]

## 2023-05-10 ENCOUNTER — Encounter (HOSPITAL_BASED_OUTPATIENT_CLINIC_OR_DEPARTMENT_OTHER): Payer: Self-pay

## 2023-05-10 ENCOUNTER — Other Ambulatory Visit (HOSPITAL_BASED_OUTPATIENT_CLINIC_OR_DEPARTMENT_OTHER): Payer: Self-pay

## 2023-05-10 ENCOUNTER — Other Ambulatory Visit: Payer: Self-pay | Admitting: Family Medicine

## 2023-05-10 ENCOUNTER — Other Ambulatory Visit: Payer: Self-pay

## 2023-05-10 DIAGNOSIS — E559 Vitamin D deficiency, unspecified: Secondary | ICD-10-CM

## 2023-05-15 ENCOUNTER — Ambulatory Visit: Payer: 59 | Admitting: Family Medicine

## 2023-05-15 ENCOUNTER — Encounter: Payer: Self-pay | Admitting: Family Medicine

## 2023-05-15 ENCOUNTER — Other Ambulatory Visit (HOSPITAL_BASED_OUTPATIENT_CLINIC_OR_DEPARTMENT_OTHER): Payer: Self-pay

## 2023-05-15 VITALS — BP 131/77 | HR 76 | Temp 98.0°F | Ht 64.0 in | Wt 368.0 lb

## 2023-05-15 DIAGNOSIS — E559 Vitamin D deficiency, unspecified: Secondary | ICD-10-CM | POA: Diagnosis not present

## 2023-05-15 DIAGNOSIS — G4733 Obstructive sleep apnea (adult) (pediatric): Secondary | ICD-10-CM

## 2023-05-15 DIAGNOSIS — Z6841 Body Mass Index (BMI) 40.0 and over, adult: Secondary | ICD-10-CM

## 2023-05-15 DIAGNOSIS — K5900 Constipation, unspecified: Secondary | ICD-10-CM | POA: Diagnosis not present

## 2023-05-15 DIAGNOSIS — E66813 Obesity, class 3: Secondary | ICD-10-CM

## 2023-05-15 MED ORDER — VITAMIN D (ERGOCALCIFEROL) 1.25 MG (50000 UNIT) PO CAPS
50000.0000 [IU] | ORAL_CAPSULE | ORAL | 0 refills | Status: DC
  Filled 2023-05-15: qty 5, 35d supply, fill #0

## 2023-05-15 MED ORDER — POLYETHYLENE GLYCOL 3350 17 GM/SCOOP PO POWD
17.0000 g | Freq: Two times a day (BID) | ORAL | 1 refills | Status: DC | PRN
  Filled 2023-05-15: qty 238, 7d supply, fill #0

## 2023-05-15 MED ORDER — LOMAIRA 8 MG PO TABS
8.0000 mg | ORAL_TABLET | Freq: Two times a day (BID) | ORAL | 0 refills | Status: DC
Start: 2023-05-15 — End: 2023-06-14
  Filled 2023-05-15: qty 60, 30d supply, fill #0

## 2023-05-15 NOTE — Assessment & Plan Note (Signed)
Last vitamin D Lab Results  Component Value Date   VD25OH 35.55 01/12/2023   She has been taking vitamin D 50,000 IU once weekly.  Energy level is starting to improve.  Recheck vitamin D level next visit

## 2023-05-15 NOTE — Assessment & Plan Note (Signed)
Reviewed body composition results and her progress over the past 2 months which includes a 1 pound weight loss in 2 months of medically supervised weight management on a prescribed reduced calorie higher protein/low sugar diet.  She has not yet added in regular exercise and has quite a sedentary job.  She has been over consuming based on her my fitness pal app.  Her nutrition goal was adjusted for her today back down to 1600 cal/day.  She has seen some improvements in hunger and cravings with the addition of Lomaira 8 mg tab twice daily and her blood pressure and heart rate are within normal limits.  She lacks insurance coverage for GLP-1 receptor agonist.  We briefly discussed the role of bariatric surgery given her BMI of 63 and her lifelong history of obesity.  She will consider attending a surgical seminar if we are not seeing progress in the next 3 months.

## 2023-05-15 NOTE — Assessment & Plan Note (Signed)
Worsening.  Patient were notes a worsening in constipation likely multifactorial from a higher protein diet, use of Lomaira, inadequate water intake and a history of IBS-C.  She has been using MiraLAX as needed.  Refilled prescription for MiraLAX to use once daily as needed.  Recommend increasing water intake to 100 ounces per day.  Encouraged more fruit and vegetable intake She may also add in magnesium citrate powder once daily

## 2023-05-15 NOTE — Assessment & Plan Note (Signed)
She has been set up to see Dr. Jerre Simon [facility] this Friday.  She has never used BiPAP before but according to her most recent polysomnogram done by Dr. Maple Hudson, her AHI was very high at 121.4.  She reports poor sleep and lots of daytime somnolence.  Keep upcoming visit as scheduled with Dr. Jerre Simon for evaluation and treatment

## 2023-05-15 NOTE — Progress Notes (Signed)
Office: [phone]  /  Fax: [phone]  WEIGHT SUMMARY AND BIOMETRICS  Starting Date: 03/08/23  Starting Weight: 369lb   Weight Lost Since Last Visit: 1lb   Vitals Temp: 98 F (36.7 C) BP: 131/77 Pulse Rate: 76 SpO2: 99 %   Body Composition  Body Fat %: 62.6 % Fat Mass (lbs): 230.6 lbs Muscle Mass (lbs): 130.8 lbs Visceral Fat Rating : 28    HPI  Chief Complaint: OBESITY  Marissa Joseph is here to discuss her progress with her obesity treatment plan. She is on the the Category 3 Plan and states she is following her eating plan approximately 50 % of the time. She states she is exercising 20 minutes 2 times per week.   Interval History:  Since last office visit she is down 1 lb She did start on Lomaira 8 mg bid with the SE of dry mouth and sporadic constipation She plans to get started with swimming at the [facility] or [facility] She has a sedentary job She has been taking Lomaira first thing in the morning and the 2nd dose between lunch and dinner Marissa Joseph has helped to reduce appetite but still has some cravings She has been tracking her calories on the my fitness pal app but was getting in over 2200/day. She is finally set up for an appointment with sleep medicine this Friday for history of severe OSA, not on BiPAP She has a net weight loss of 1 pound in the past 2 months of medically supervised weight management She has been fearful of bariatric surgery  Pharmacotherapy: Lomaira 8 mg twice daily  PHYSICAL EXAM:  Blood pressure 131/77, pulse 76, temperature 98 F (36.7 C), height 5\' 4"  (1.626 m), weight (!) 368 lb (166.9 kg), SpO2 99%. Body mass index is 63.17 kg/m.  General: She is overweight, cooperative, alert, well developed, and in no acute distress. PSYCH: Has normal mood, affect and thought process.   Lungs: Normal breathing effort, no conversational dyspnea.   ASSESSMENT AND PLAN  TREATMENT PLAN FOR OBESITY:  Recommended Dietary  Goals  Marissa Joseph is currently in the action stage of change. As such, her goal is to continue weight management plan. She has agreed to keeping a food journal and adhering to recommended goals of 1600 calories and 120 g of protein. -I manually changed her goals on her my fitness pal app to reflect this target each day  Behavioral Intervention  We discussed the following Behavioral Modification Strategies today: increasing lean protein intake to established goals, increasing vegetables, increasing water intake , work on meal planning and preparation, work on tracking and journaling calories using tracking application, reading food labels , keeping healthy foods at home, practice mindfulness eating and understand the difference between hunger signals and cravings, work on managing stress, creating time for self-care and relaxation, continue to work on implementation of reduced calorie nutritional plan, planning for success, and continue to work on maintaining a reduced calorie state, getting the recommended amount of protein, incorporating whole foods, making healthy choices, staying well hydrated and practicing mindfulness when eating..  Additional resources provided today: NA  Recommended Physical Activity Goals  Marissa Joseph has been advised to work up to 150 minutes of moderate intensity aerobic activity a week and strengthening exercises 2-3 times per week for cardiovascular health, weight loss maintenance and preservation of muscle mass.   She has agreed to Think about enjoyable ways to increase daily physical activity and overcoming barriers to exercise and Increase physical activity in their day  and reduce sedentary time (increase NEAT). Check out local water exercise options with a goal of once a week to get started  Pharmacotherapy changes for the treatment of obesity: None  ASSOCIATED CONDITIONS ADDRESSED TODAY  Constipation, unspecified constipation type Assessment & Plan: Worsening.   Patient were notes a worsening in constipation likely multifactorial from a higher protein diet, use of Lomaira, inadequate water intake and a history of IBS-C.  She has been using MiraLAX as needed.  Refilled prescription for MiraLAX to use once daily as needed.  Recommend increasing water intake to 100 ounces per day.  Encouraged more fruit and vegetable intake She may also add in magnesium citrate powder once daily  Orders: -     Polyethylene Glycol 3350; Take 17 g by mouth 2 (two) times daily as needed for severe constipation.  Dispense: 238 g; Refill: 1  Class 3 severe obesity due to excess calories with serious comorbidity and body mass index (BMI) of 60.0 to 69.9 in adult [facility]) Assessment & Plan: Reviewed body composition results and her progress over the past 2 months which includes a 1 pound weight loss in 2 months of medically supervised weight management on a prescribed reduced calorie higher protein/low sugar diet.  She has not yet added in regular exercise and has quite a sedentary job.  She has been over consuming based on her my fitness pal app.  Her nutrition goal was adjusted for her today back down to 1600 cal/day.  She has seen some improvements in hunger and cravings with the addition of Lomaira 8 mg tab twice daily and her blood pressure and heart rate are within normal limits.  She lacks insurance coverage for GLP-1 receptor agonist.  We briefly discussed the role of bariatric surgery given her BMI of 63 and her lifelong history of obesity.  She will consider attending a surgical seminar if we are not seeing progress in the next 3 months.  Orders: Marissa Joseph; Take 1 tablet (8 mg total) by mouth 2 (two) times daily. Take 30 minutes before first meal of the day and 1 hour after 2nd meal of the day.  Dispense: 60 tablet; Refill: 0  Vitamin D deficiency Assessment & Plan: Last vitamin D Lab Results  Component Value Date   VD25OH 35.55 01/12/2023   She has been taking  vitamin D 50,000 IU once weekly.  Energy level is starting to improve.  Recheck vitamin D level next visit  Orders: -     Vitamin D (Ergocalciferol); Take 1 capsule (50,000 Units total) by mouth every 7 (seven) days.  Dispense: 5 capsule; Refill: 0  OSA (obstructive sleep apnea) Assessment & Plan: She has been set up to see Dr. Jerre Simon [facility] this Friday.  She has never used BiPAP before but according to her most recent polysomnogram done by Dr. Maple Hudson, her AHI was very high at 121.4.  She reports poor sleep and lots of daytime somnolence.  Keep upcoming visit as scheduled with Dr. Jerre Simon for evaluation and treatment       She was informed of the importance of frequent follow up visits to maximize her success with intensive lifestyle modifications for her multiple health conditions.   ATTESTASTION STATEMENTS:  Reviewed by clinician on day of visit: allergies, medications, problem list, medical history, surgical history, family history, social history, and previous encounter notes pertinent to obesity diagnosis.   I have personally spent 30 minutes total time today in preparation, patient  care, nutritional counseling and documentation for this visit, including the following: review of clinical lab tests; review of medical tests/procedures/services.      Glennis Brink, DO DABFM, DABOM [facility] [address] [place], [place] [address] [phone]

## 2023-05-15 NOTE — Patient Instructions (Addendum)
Continue to log daily intake on the MyFitnessPal ap Aim for 1600 cal/ day This should include 120 g of protein daily Hydrate well water/ sugar free drinks Adding in a sugar free electrolyte drink / packet daily  Try out options for water exercise once a week

## 2023-05-21 ENCOUNTER — Other Ambulatory Visit (HOSPITAL_BASED_OUTPATIENT_CLINIC_OR_DEPARTMENT_OTHER): Payer: Self-pay

## 2023-05-22 ENCOUNTER — Other Ambulatory Visit (HOSPITAL_BASED_OUTPATIENT_CLINIC_OR_DEPARTMENT_OTHER): Payer: Self-pay

## 2023-05-23 ENCOUNTER — Other Ambulatory Visit (HOSPITAL_BASED_OUTPATIENT_CLINIC_OR_DEPARTMENT_OTHER): Payer: Self-pay

## 2023-06-14 ENCOUNTER — Ambulatory Visit: Payer: 59 | Admitting: Family Medicine

## 2023-06-14 ENCOUNTER — Other Ambulatory Visit (HOSPITAL_BASED_OUTPATIENT_CLINIC_OR_DEPARTMENT_OTHER): Payer: Self-pay

## 2023-06-14 ENCOUNTER — Encounter: Payer: Self-pay | Admitting: Family Medicine

## 2023-06-14 VITALS — BP 139/83 | HR 75 | Temp 98.3°F | Ht 64.0 in | Wt 367.0 lb

## 2023-06-14 DIAGNOSIS — E559 Vitamin D deficiency, unspecified: Secondary | ICD-10-CM

## 2023-06-14 DIAGNOSIS — Z9189 Other specified personal risk factors, not elsewhere classified: Secondary | ICD-10-CM | POA: Diagnosis not present

## 2023-06-14 DIAGNOSIS — Z6841 Body Mass Index (BMI) 40.0 and over, adult: Secondary | ICD-10-CM

## 2023-06-14 DIAGNOSIS — G4733 Obstructive sleep apnea (adult) (pediatric): Secondary | ICD-10-CM | POA: Diagnosis not present

## 2023-06-14 DIAGNOSIS — E66813 Obesity, class 3: Secondary | ICD-10-CM

## 2023-06-14 MED ORDER — LOMAIRA 8 MG PO TABS
8.0000 mg | ORAL_TABLET | Freq: Two times a day (BID) | ORAL | 0 refills | Status: DC
  Filled 2023-06-14: qty 60, 30d supply, fill #0

## 2023-06-14 MED ORDER — VITAMIN D (ERGOCALCIFEROL) 1.25 MG (50000 UNIT) PO CAPS
50000.0000 [IU] | ORAL_CAPSULE | ORAL | 0 refills | Status: DC
  Filled 2023-06-14: qty 4, 28d supply, fill #0

## 2023-06-14 NOTE — Assessment & Plan Note (Signed)
 Sedentary lifestyle has been a contributing factor her to her weight gain over the past several years.  She works as a second tefl teacher.  She did join [facility] [employer] [facility] and has already weight training 2 days a week.  She did meet with a trainer there this week.  Congratulated her on starting her fitness journey at [facility].  Encouraged weight training 2 days a week, slowly adding in more walking.

## 2023-06-14 NOTE — Progress Notes (Signed)
 Office: [phone]  /  Fax: [phone]  WEIGHT SUMMARY AND BIOMETRICS  Starting Date: 03/08/23  Starting Weight: 369lb   Weight Lost Since Last Visit: 1lb   Vitals Temp: 98.3 F (36.8 C) BP: 139/83 Pulse Rate: 75 SpO2: 99 %   Body Composition  Body Fat %: 62.2 % Fat Mass (lbs): 228.4 lbs Muscle Mass (lbs): 131.8 lbs Visceral Fat Rating : 27    HPI  Chief Complaint: OBESITY  Marissa Joseph is here to discuss her progress with her obesity treatment plan. She is on the the Category 3 Plan and states she is following her eating plan approximately 75 % of the time. She states she is exercising 30-60 minutes 2 times per week.   Interval History:  Since last office visit she is down 1 lb She is up 1 lb of muscle mass and down 2.2 lb of body fat in the past month This gives her a net weight loss of 2 lb in the past 3 mos She did join [facility] and has started some weight training this week She is tracking her calorie intake She has been mindful of sweets Cravings have improved She has improved appetite and food impulse control with the addition of Lomaira  8 mg 2 x a day Energy level has improved  Pharmacotherapy: Lomaira  8 mg bid   PHYSICAL EXAM:  Blood pressure 139/83, pulse 75, temperature 98.3 F (36.8 C), height 5' 4 (1.626 m), weight (!) 367 lb (166.5 kg), SpO2 99%. Body mass index is 63 kg/m.  General: She is overweight, cooperative, alert, well developed, and in no acute distress. PSYCH: Has normal mood, affect and thought process.   Lungs: Normal breathing effort, no conversational dyspnea.   ASSESSMENT AND PLAN  TREATMENT PLAN FOR OBESITY:  Recommended Dietary Goals  Marissa Joseph is currently in the action stage of change. As such, her goal is to continue weight management plan. She has agreed to the Category 3 Plan.  Behavioral Intervention  We discussed the following Behavioral Modification Strategies today: increasing lean protein intake to  established goals, increasing fiber rich foods, increasing water intake , work on meal planning and preparation, keeping healthy foods at home, identifying sources and decreasing liquid calories, decreasing eating out or consumption of processed foods, and making healthy choices when eating convenient foods, continue to practice mindfulness when eating, planning for success, and continue to work on maintaining a reduced calorie state, getting the recommended amount of protein, incorporating whole foods, making healthy choices, staying well hydrated and practicing mindfulness when eating..  Additional resources provided today: NA  Recommended Physical Activity Goals  Marissa Joseph has been advised to work up to 150 minutes of moderate intensity aerobic activity a week and strengthening exercises 2-3 times per week for cardiovascular health, weight loss maintenance and preservation of muscle mass.   She has agreed to [employer] strengthening exercises with a goal of 2-3 sessions a week   Pharmacotherapy changes for the treatment of obesity: none  ASSOCIATED CONDITIONS ADDRESSED TODAY  OSA (obstructive sleep apnea) Assessment & Plan: She has severe OSA but is not on CPAP or BiPap.  Previously referred to sleep medicine but facility was not covered by her insurance.  She c/o daytime fatigue and is still struggling with weight reduction.  Referred to Dr Burnard for eval and treatment Continue active plan for weight loss  Orders: -     Ambulatory referral to Cardiology  Vitamin D  deficiency Assessment & Plan: Last vitamin D  Lab Results  Component Value  Date   VD25OH 35.55 01/12/2023   She is doing well on RX vitamin D  weekly Energy level starting to improve  Aim for range 50-70  Repeat vitamin D  level in the next 1 month  Orders: -     Vitamin D  (Ergocalciferol ); Take 1 capsule (50,000 Units total) by mouth every 7 (seven) days.  Dispense: 5 capsule; Refill: 0 -     VITAMIN D  25 Hydroxy (Vit-D  Deficiency, Fractures)  Class 3 severe obesity due to excess calories with serious comorbidity and body mass index (BMI) of 60.0 to 69.9 in adult [facility]) Assessment & Plan: Patient has a net weight loss of just 2 pounds in the past 3 months of medically supervised weight management.  She is fairly compliant with her category 3 meal plan with improved appetite control and reduced cravings with the addition of Lomaira  8 mg tab twice daily.  Blood pressure and heart rate are stable.  She denies adverse side effects.  She is getting started at [facility] adding in weight training 2 times a week.  Barriers to further progress include: Working a second shift job, sedentary job, untreated OSA She does lack training and development officer for GLP-1 receptor agonist Consider bariatric surgery given her high BMI if not seeing adequate progress over the next 6 months  Orders: -     Lomaira ; Take 1 tablet (8 mg total) by mouth 2 (two) times daily. Take 30 minutes before first meal of the day and 1 hour after 2nd meal of the day.  Dispense: 60 tablet; Refill: 0  Sedentary lifestyle Assessment & Plan: Sedentary lifestyle has been a contributing factor her to her weight gain over the past several years.  She works as a second tefl teacher.  She did join [place] [place] [facility] and has already weight training 2 days a week.  She did meet with a trainer there this week.  Congratulated her on starting her fitness journey at [facility].  Encouraged weight training 2 days a week, slowly adding in more walking.       She was informed of the importance of frequent follow up visits to maximize her success with intensive lifestyle modifications for her multiple health conditions.   ATTESTASTION STATEMENTS:  Reviewed by clinician on day of visit: allergies, medications, problem list, medical history, surgical history, family history, social history, and previous encounter notes pertinent to  obesity diagnosis.   I have personally spent 30 minutes total time today in preparation, patient care, nutritional counseling and documentation for this visit, including the following: review of clinical lab tests; review of medical tests/procedures/services.      Marissa FORBES Haddock, DO DABFM, DABOM [facility] [address] [place], [place] [address] [phone]

## 2023-06-14 NOTE — Assessment & Plan Note (Signed)
 She has severe OSA but is not on CPAP or BiPap.  Previously referred to sleep medicine but facility was not covered by her insurance.  She c/o daytime fatigue and is still struggling with weight reduction.  Referred to Dr Burnard for eval and treatment Continue active plan for weight loss

## 2023-06-14 NOTE — Assessment & Plan Note (Signed)
 Patient has a net weight loss of just 2 pounds in the past 3 months of medically supervised weight management.  She is fairly compliant with her category 3 meal plan with improved appetite control and reduced cravings with the addition of Lomaira  8 mg tab twice daily.  Blood pressure and heart rate are stable.  She denies adverse side effects.  She is getting started at  [facility] adding in weight training 2 times a week.  Barriers to further progress include: Working a second shift job, sedentary job, untreated OSA She does lack training and development officer for GLP-1 receptor agonist Consider bariatric surgery given her high BMI if not seeing adequate progress over the next 6 months

## 2023-06-14 NOTE — Assessment & Plan Note (Signed)
 Last vitamin D Lab Results  Component Value Date   VD25OH 35.55 01/12/2023   She is doing well on RX vitamin D weekly Energy level starting to improve  Aim for range 50-70  Repeat vitamin D level in the next 1 month

## 2023-06-15 LAB — VITAMIN D 25 HYDROXY (VIT D DEFICIENCY, FRACTURES): Vit D, 25-Hydroxy: 58.2 ng/mL (ref 30.0–100.0)

## 2023-06-21 ENCOUNTER — Encounter: Payer: Self-pay | Admitting: Internal Medicine

## 2023-06-23 ENCOUNTER — Other Ambulatory Visit (HOSPITAL_BASED_OUTPATIENT_CLINIC_OR_DEPARTMENT_OTHER): Payer: Self-pay

## 2023-06-25 ENCOUNTER — Ambulatory Visit: Payer: 59 | Admitting: Internal Medicine

## 2023-07-04 ENCOUNTER — Ambulatory Visit: Payer: Self-pay | Admitting: Internal Medicine

## 2023-07-04 ENCOUNTER — Other Ambulatory Visit (HOSPITAL_BASED_OUTPATIENT_CLINIC_OR_DEPARTMENT_OTHER): Payer: Self-pay

## 2023-07-04 ENCOUNTER — Emergency Department (HOSPITAL_BASED_OUTPATIENT_CLINIC_OR_DEPARTMENT_OTHER)
Admission: EM | Admit: 2023-07-04 | Discharge: 2023-07-04 | Disposition: A | Discharge status: Home / Self Care | Payer: 59 | Attending: Emergency Medicine | Admitting: Emergency Medicine

## 2023-07-04 ENCOUNTER — Emergency Department (HOSPITAL_BASED_OUTPATIENT_CLINIC_OR_DEPARTMENT_OTHER): Payer: 59 | Admitting: Radiology

## 2023-07-04 ENCOUNTER — Other Ambulatory Visit: Payer: Self-pay

## 2023-07-04 DIAGNOSIS — Z20822 Contact with and (suspected) exposure to covid-19: Secondary | ICD-10-CM | POA: Diagnosis not present

## 2023-07-04 DIAGNOSIS — J45901 Unspecified asthma with (acute) exacerbation: Secondary | ICD-10-CM | POA: Diagnosis not present

## 2023-07-04 DIAGNOSIS — J101 Influenza due to other identified influenza virus with other respiratory manifestations: Secondary | ICD-10-CM | POA: Insufficient documentation

## 2023-07-04 DIAGNOSIS — J45909 Unspecified asthma, uncomplicated: Secondary | ICD-10-CM | POA: Diagnosis not present

## 2023-07-04 DIAGNOSIS — R059 Cough, unspecified: Secondary | ICD-10-CM | POA: Diagnosis not present

## 2023-07-04 DIAGNOSIS — R0789 Other chest pain: Secondary | ICD-10-CM | POA: Diagnosis not present

## 2023-07-04 LAB — RESP PANEL BY RT-PCR (RSV, FLU A&B, COVID)  RVPGX2
Influenza A by PCR: POSITIVE — AB
Influenza B by PCR: NEGATIVE
Resp Syncytial Virus by PCR: NEGATIVE
SARS Coronavirus 2 by RT PCR: NEGATIVE

## 2023-07-04 MED ORDER — PREDNISONE 10 MG PO TABS
40.0000 mg | ORAL_TABLET | Freq: Every day | ORAL | 0 refills | Status: AC
  Filled 2023-07-04: qty 20, 5d supply, fill #0

## 2023-07-04 MED ORDER — BENZONATATE 100 MG PO CAPS
100.0000 mg | ORAL_CAPSULE | Freq: Three times a day (TID) | ORAL | 0 refills | Status: DC
  Filled 2023-07-04: qty 21, 7d supply, fill #0

## 2023-07-04 MED ORDER — OSELTAMIVIR PHOSPHATE 75 MG PO CAPS
75.0000 mg | ORAL_CAPSULE | Freq: Two times a day (BID) | ORAL | 0 refills | Status: DC
  Filled 2023-07-04: qty 10, 5d supply, fill #0

## 2023-07-04 MED ORDER — IPRATROPIUM-ALBUTEROL 0.5-2.5 (3) MG/3ML IN SOLN
3.0000 mL | RESPIRATORY_TRACT | Status: AC
  Administered 2023-07-04 (×3): 3 mL via RESPIRATORY_TRACT
  Filled 2023-07-04: qty 9

## 2023-07-04 MED ORDER — DEXAMETHASONE 4 MG PO TABS
4.0000 mg | ORAL_TABLET | Freq: Once | ORAL | Status: AC
  Administered 2023-07-04: 4 mg via ORAL
  Filled 2023-07-04: qty 1

## 2023-07-04 NOTE — ED Triage Notes (Signed)
Pt caox4, ambulatory c/o productive cough and congestion that started over the weekend, further reporting last night she began to have an asthma exacerbation. Pt used inhaler at home with some relief but states cough has worsened.

## 2023-07-04 NOTE — Telephone Encounter (Signed)
Copied from CRM [id number]. Topic: Clinical - Red Word Triage >> Jul 04, 2023  9:59 AM Elizebeth Brooking wrote: Red Word that prompted transfer to Nurse Triage: Patient stated she has been having this cough on and off, and now experiencing this uncomfortable pull around her heart area   Chief Complaint: chest pain Symptoms: worsening chest pain 4/10, intermittent SOB, wheezing, cough, congestion, pain in neck and back Frequency: continual, constant chest discomfort, intermittent SOB Pertinent Negatives: Patient denies trouble walking,  Disposition: [x] 911 / [] ED /[] Urgent Care (no appt availability in office) / [] Appointment(In office/virtual)/ []  [facility]/ [] Home Care/ [] Refused Recommended Disposition /[] [facility]/ []  Follow-up with PCP Additional Notes: Pt reporting thinks she's been sick with "cold" for past week but since yesterday been having to use inhaler more often, having "asthma trouble" and last night started having "pull" feeling "up under my breast, towards my back too, on left side, middle of back" with "a little pain in neck, normally have lot of tension," feels "pull right now." Pt reporting 4/10 discomfort to the area that has been "constant" since it started but "worse this morning, worrying me." Pt reporting she "feel it most when cough" and "when breathe in, have a slight wheeze, but been taking my med more often since last night." Pt confirms hx of heart murmur, high BP, and asthma. Advised pt call 911, pt reporting "but I can breathe, I can walk," advised that with symptoms she is having, especially since worsening, could be something cardiac going on. Pt verbalized understanding. Offered to call 911 for pt, pt declines. Advised pt call 911, have someone take her to hospital asap. Pt verbalized understanding. Nurse asked if nurse could do anything further for pt, pt declined. Nurse unsure if pt will call 911 or go to ED.  Reason for Disposition  [1] Chest pain  lasts > 5 minutes AND [2] age > 30 AND [3] one or more cardiac risk factors (e.g., diabetes, high blood pressure, high cholesterol, smoker, or strong family history of heart disease)  Answer Assessment - Initial Assessment Questions 1. LOCATION: "Where does it hurt?"       Heart area, Up under my breast, towards my back too, on left side, middle of back 2. RADIATION: "Does the pain go anywhere else?" (e.g., into neck, jaw, arms, back)     Little pain in neck, normally have lot of tension 3. ONSET: "When did the chest pain begin?" (Minutes, hours or days)      Yesterday last night, worse this morning, worrying me 4. PATTERN: "Does the pain come and go, or has it been constant since it started?"  "Does it get worse with exertion?"      Constant since started, feel it most when cough, when breathe in have slight wheeze but been taking my med more often since last night 5. DURATION: "How long does it last" (e.g., seconds, minutes, hours)     constant 6. SEVERITY: "How bad is the pain?"  (e.g., Scale 1-10; mild, moderate, or severe)    - MILD (1-3): doesn't interfere with normal activities     - MODERATE (4-7): interferes with normal activities or awakens from sleep    - SEVERE (8-10): excruciating pain, unable to do any normal activities       4/10 7. CARDIAC RISK FACTORS: "Do you have any history of heart problems or risk factors for heart disease?" (e.g., angina, prior heart attack; diabetes, high blood pressure, high cholesterol, smoker, or  strong family history of heart disease)     Heart murmur, per pt chart hx high BP 8. PULMONARY RISK FACTORS: "Do you have any history of lung disease?"  (e.g., blood clots in lung, asthma, emphysema, birth control pills)     asthma 9. CAUSE: "What do you think is causing the chest pain?"     Think illness 10. OTHER SYMPTOMS: "Do you have any other symptoms?" (e.g., dizziness, nausea, vomiting, sweating, fever, difficulty breathing, cough)       Mucus came  up throughout week, asthma trouble, been using inhaler more often 11. PREGNANCY: "Is there any chance you are pregnant?" "When was your last menstrual period?"       Denies  Protocols used: Chest Pain-A-AH

## 2023-07-04 NOTE — ED Notes (Signed)
Patient transported to X-ray

## 2023-07-04 NOTE — ED Notes (Signed)
Discharge instructions, follow up care, and prescriptions reviewed and explained, pt verbalized understanding and had no further questions on d/c.

## 2023-07-04 NOTE — ED Provider Notes (Signed)
[place] EMERGENCY DEPARTMENT AT [facility] Provider Note   CSN: [id number] Arrival date & time: 07/04/23  1051     History  Chief Complaint  Patient presents with   Cough    Marissa Joseph is a 43 y.o. female.  Patient with history of asthma, obesity presents today with complaints of cough and congestion.  She states that same began yesterday evening and has been persistent since then.  She feels like she has a cold which has exacerbated her asthma symptoms.  She has been trying her inhaler with some relief.  Denies fevers or chills.  She called her primary care doctor and they recommended she come here to be evaluated.  She presents for same.  The history is provided by the patient. No language interpreter was used.  Cough      Home Medications Prior to Admission medications   Medication Sig Start Date End Date Taking? Authorizing Provider  albuterol (VENTOLIN HFA) 108 (90 Base) MCG/ACT inhaler Inhale 1-2 puffs into the lungs every 6 (six) hours as needed for wheezing 01/12/23   Pincus Sanes, MD  bisacodyl (DULCOLAX) 5 MG EC tablet Take 1 tablet (5 mg total) by mouth daily as needed for moderate constipation. 02/04/20   Pincus Sanes, MD  budesonide-formoterol (SYMBICORT) 80-4.5 MCG/ACT inhaler Inhale 2 puffs into the lungs 2 (two) times daily. 01/12/23   Pincus Sanes, MD  buPROPion (WELLBUTRIN XL) 150 MG 24 hr tablet Take 1 tablet (150 mg total) by mouth daily. 01/12/23   Pincus Sanes, MD  diclofenac Sodium (VOLTAREN) 1 % GEL Apply 4 g topically 4 (four) times daily as needed. 07/17/19   Lamptey, Britta Mccreedy, MD  doxycycline (VIBRAMYCIN) 100 MG capsule Take 1 capsule (100 mg total) by mouth 2 (two) times daily. 03/19/23   Pollyann Savoy, MD  EPINEPHrine 0.3 mg/0.3 mL IJ SOAJ injection Inject 0.3 mg into the muscle as needed for anaphylaxis. 11/21/22   Pincus Sanes, MD  furosemide (LASIX) 20 MG tablet Take 1 tablet (20 mg total) by mouth daily. 07/14/22   Pincus Sanes, MD  gabapentin (NEURONTIN) 100 MG capsule Take 2 capsules (200 mg total) by mouth at bedtime. 01/12/23   Pincus Sanes, MD  indomethacin (INDOCIN) 50 MG capsule TAKE 1 CAPSULE BY MOUTH THREE TIMES DAILY WITH MEALS 11/10/20   [provider]  lidocaine (LIDODERM) 5 % Place 1 patch onto the skin daily. Remove & Discard patch within 12 hours or as directed by MD 03/05/22   Ernie Avena, MD  methocarbamol (ROBAXIN) 500 MG tablet Take 1 tablet (500 mg total) by mouth every 6 (six) hours as needed for muscle spasms. 03/08/22   Burns, Bobette Mo, MD  montelukast (SINGULAIR) 10 MG tablet Take 1 tablet (10 mg total) by mouth at bedtime. 11/25/21   Pincus Sanes, MD  nebivolol (BYSTOLIC) 2.5 MG tablet Take 1 tablet (2.5 mg total) by mouth daily. 01/12/23   Pincus Sanes, MD  olmesartan-hydrochlorothiazide (BENICAR HCT) 20-12.5 MG tablet Take 1 tablet by mouth daily. 01/12/23   Pincus Sanes, MD  Phentermine HCl (LOMAIRA) 8 MG TABS Take 1 tablet (8 mg total) by mouth 2 (two) times daily. Take 30 minutes before first meal of the day and 1 hour after 2nd meal of the day. 06/14/23   Bowen, Scot Jun, DO  polyethylene glycol powder (GLYCOLAX/MIRALAX) 17 GM/SCOOP powder Take 17 g by mouth 2 (two) times daily as needed for severe  constipation. 05/15/23   Bowen, Scot Jun, DO  traMADol (ULTRAM) 50 MG tablet Take 1 tablet (50 mg total) by mouth every 12 (twelve) hours as needed. 08/09/21   Cristie Hem, PA-C  triamcinolone cream (KENALOG) 0.1 % Apply 1 Application topically 2 (two) times daily. 01/12/23   Pincus Sanes, MD  Vitamin D, Ergocalciferol, (DRISDOL) 1.25 MG (50000 UNIT) CAPS capsule Take 1 capsule (50,000 Units total) by mouth every 7 (seven) days. 06/14/23   Bowen, Scot Jun, DO      Allergies    Avocado and Shellfish allergy    Review of Systems   Review of Systems  HENT:  Positive for congestion.   Respiratory:  Positive for cough.   All other systems reviewed and are negative.   Physical  Exam Updated Vital Signs BP (!) 138/93   Pulse 76   Temp 98.1 F (36.7 C) (Oral)   Resp 20   Ht 5\' 4"  (1.626 m)   Wt (!) 169.2 kg   LMP 06/20/2023 (Approximate)   SpO2 100%   BMI 64.03 kg/m  Physical Exam Vitals and nursing note reviewed.  Constitutional:      General: She is not in acute distress.    Appearance: Normal appearance. She is normal weight. She is not ill-appearing, toxic-appearing or diaphoretic.  HENT:     Head: Normocephalic and atraumatic.  Cardiovascular:     Rate and Rhythm: Normal rate and regular rhythm.     Heart sounds: Normal heart sounds.  Pulmonary:     Effort: Pulmonary effort is normal. No respiratory distress.     Breath sounds: Wheezing present.     Comments: Mild expiratory wheezing at the bilateral lung bases Abdominal:     General: Abdomen is flat.     Palpations: Abdomen is soft.     Tenderness: There is no abdominal tenderness.  Musculoskeletal:        General: Normal range of motion.     Cervical back: Normal range of motion.  Skin:    General: Skin is warm and dry.  Neurological:     General: No focal deficit present.     Mental Status: She is alert.  Psychiatric:        Mood and Affect: Mood normal.        Behavior: Behavior normal.     ED Results / Procedures / Treatments   Labs (all labs ordered are listed, but only abnormal results are displayed) Labs Reviewed  RESP PANEL BY RT-PCR (RSV, FLU A&B, COVID)  RVPGX2 - Abnormal; Notable for the following components:      Result Value   Influenza A by PCR POSITIVE (*)    All other components within normal limits    EKG EKG Interpretation Date/Time:  Wednesday July 04 2023 11:03:50 EST Ventricular Rate:  74 PR Interval:  184 QRS Duration:  92 QT Interval:  399 QTC Calculation: 443 R Axis:   52  Text Interpretation: Sinus rhythm Confirmed by Ernie Avena ([id number]) on 07/04/2023 11:11:24 AM  Radiology No results found.  Procedures Procedures    Medications  Ordered in ED Medications  ipratropium-albuterol (DUONEB) 0.5-2.5 (3) MG/3ML nebulizer solution 3 mL (3 mLs Nebulization Given 07/04/23 1215)  dexamethasone (DECADRON) tablet 4 mg (4 mg Oral Given 07/04/23 1216)    ED Course/ Medical Decision Making/ A&P  Medical Decision Making Amount and/or Complexity of Data Reviewed Radiology: ordered.  Risk Prescription drug management.   This patient is a 43 y.o. female  who presents to the ED for concern of cough, congestion, wheezing.   Differential diagnoses prior to evaluation: The emergent differential diagnosis includes, but is not limited to, URI, pneumonia, asthma exacerbation. This is not an exhaustive differential.   Past Medical History / Co-morbidities / Social History:  has a past medical history of ADD (attention deficit disorder), Anxiety, Asthma, Back pain, Depression, Eczema, Edema, Fibroids, GONORRHEA (03/25/2008), Heart murmur, Heart murmur, Hypertension, Joint pain, MRSA (methicillin resistant staph aureus) culture positive (05/2012), Multiple food allergies, Obesity, and Sleep apnea.  Additional history: Chart reviewed.  Physical Exam: Physical exam performed. The pertinent findings include: Mild obesity in the bilateral lung bases  Lab Tests/Imaging studies: I personally interpreted labs/imaging and the pertinent results include:  Flu A +, CXR shows NAD. I agree with the radiologist interpretation.  Cardiac monitoring: EKG obtained and interpreted by myself and attending physician which shows: sinus rhythm, no STEMI   Medications: I ordered medication including decadron, duoneb. Given with improvement.  I have reviewed the patients home medicines and have made adjustments as needed.   Disposition: After consideration of the diagnostic results and the patients response to treatment, I feel that emergency department workup does not suggest an emergent condition requiring admission or  immediate intervention beyond what has been performed at this time. The plan is: Discharge with close outpatient follow-up and return precautions.  After steroids and nebulizer treatment, she is feeling significantly improved and ready to go home.  Given that she has the flu and has been symptomatic for less than 48 hours, will prescribe Tamiflu.  Will also give steroids and Tessalon.  Recommend she use her home and albuterol inhalers as well.  Evaluation and diagnostic testing in the emergency department does not suggest an emergent condition requiring admission or immediate intervention beyond what has been performed at this time.  Plan for discharge with close PCP follow-up.  Patient is understanding and amenable with plan, educated on red flag symptoms that would prompt immediate return.  Patient discharged in stable condition.  Final Clinical Impression(s) / ED Diagnoses Final diagnoses:  Influenza A  Exacerbation of asthma, unspecified asthma severity, unspecified whether persistent    Rx / DC Orders ED Discharge Orders          Ordered    predniSONE (DELTASONE) 10 MG tablet  Daily        07/04/23 1451    benzonatate (TESSALON) 100 MG capsule  Every 8 hours        07/04/23 1451    oseltamivir (TAMIFLU) 75 MG capsule  Every 12 hours        07/04/23 1456          An After Visit Summary was printed and given to the patient.     Vear Clock 07/04/23 1459    Ernie Avena, MD 07/04/23 [phone]

## 2023-07-04 NOTE — Discharge Instructions (Addendum)
You tested positive for influenza.  Recommend continued supportive care with Tylenol and Ibuprofen for fever control and symptomatic management of any muscle aches you may have.  Continue to push oral fluid resuscitation preferably with electrolyte containing liquids to maintain your hydration.  Return for any significant worsening symptoms, any concern for developing dehydration.

## 2023-07-05 NOTE — Telephone Encounter (Signed)
Patient went to ED on yesterday for evaluation.

## 2023-07-07 ENCOUNTER — Other Ambulatory Visit (HOSPITAL_BASED_OUTPATIENT_CLINIC_OR_DEPARTMENT_OTHER): Payer: Self-pay

## 2023-07-08 ENCOUNTER — Other Ambulatory Visit (HOSPITAL_BASED_OUTPATIENT_CLINIC_OR_DEPARTMENT_OTHER): Payer: Self-pay

## 2023-07-11 ENCOUNTER — Other Ambulatory Visit (HOSPITAL_BASED_OUTPATIENT_CLINIC_OR_DEPARTMENT_OTHER): Payer: Self-pay

## 2023-07-11 ENCOUNTER — Encounter: Payer: Self-pay | Admitting: Family Medicine

## 2023-07-11 ENCOUNTER — Ambulatory Visit: Payer: 59 | Admitting: Family Medicine

## 2023-07-11 VITALS — BP 137/90 | HR 70 | Temp 98.1°F | Ht 64.0 in | Wt 365.0 lb

## 2023-07-11 DIAGNOSIS — Z6841 Body Mass Index (BMI) 40.0 and over, adult: Secondary | ICD-10-CM | POA: Diagnosis not present

## 2023-07-11 DIAGNOSIS — G4733 Obstructive sleep apnea (adult) (pediatric): Secondary | ICD-10-CM | POA: Diagnosis not present

## 2023-07-11 DIAGNOSIS — E66813 Obesity, class 3: Secondary | ICD-10-CM

## 2023-07-11 DIAGNOSIS — E559 Vitamin D deficiency, unspecified: Secondary | ICD-10-CM | POA: Diagnosis not present

## 2023-07-11 DIAGNOSIS — I10 Essential (primary) hypertension: Secondary | ICD-10-CM | POA: Diagnosis not present

## 2023-07-11 MED ORDER — LOMAIRA 8 MG PO TABS
8.0000 mg | ORAL_TABLET | Freq: Two times a day (BID) | ORAL | 0 refills | Status: DC
  Filled 2023-07-11: qty 60, 30d supply, fill #0

## 2023-07-11 NOTE — Assessment & Plan Note (Signed)
 Patient has had slow progress with weight reduction over the past 4 months of medically supervised weight management, down 4 pounds in total.  She has struggled with compliance on her prescribed meal plan and dietary logging.  She has improved control of her appetite and cravings on Lomaira  8 mg twice daily.  She lacks insurance coverage for GLP-1 receptor agonist.  With her high BMI and lifetime history of obesity, we have discussed the option for bariatric surgery which she declined at this point in time.  Continue to work on consistency with prescribed dietary plan, eating on a schedule, meal planning.  Will closely follow blood pressure and heart rate on Lomaira  8 mg twice daily.

## 2023-07-11 NOTE — Progress Notes (Signed)
 Office: [phone]  /  Fax: [phone]  WEIGHT SUMMARY AND BIOMETRICS  Starting Date: 03/08/23  Starting Weight: 369lb   Weight Lost Since Last Visit: 2lb   Vitals Temp: 98.1 F (36.7 C) BP: (!) 137/90 Pulse Rate: 70 SpO2: 99 %   Body Composition  Body Fat %: 61.6 % Fat Mass (lbs): 224.8 lbs Muscle Mass (lbs): 133.2 lbs Visceral Fat Rating : 27    HPI  Chief Complaint: OBESITY  Marissa Joseph is here to discuss her progress with her obesity treatment plan. She is on the the Category 3 Plan and states she is following her eating plan approximately 80 % of the time. She states she is exercising 45 minutes 2 times per week.  Interval History:  Since last office visit she is down 2 lb She is up 1.4 lb of muscle and down 3.6 lb of body fat She has been taking Lomaira  8 mg bid She continues to struggle to plan meals and skips meals She is trying to buy better food choices She is making better choices when eating out She has not yet been called by Dr Joesphine office to do a sleep study Her net weight loss is 4 lb in the past 4 mos She has cut back on snacks and portion sizes with the addition of Lomaira  She has been wanting more chocolate She had the flu last week She has not been sleeping well She was on prednisone  last week  Pharmacotherapy: Lomaira  8 mg bid  PHYSICAL EXAM:  Blood pressure (!) 137/90, pulse 70, temperature 98.1 F (36.7 C), height 5' 4 (1.626 m), weight (!) 365 lb (165.6 kg), last menstrual period 06/20/2023, SpO2 99%. Body mass index is 62.65 kg/m.  General: She is overweight, cooperative, alert, well developed, and in no acute distress. PSYCH: Has normal mood, affect and thought process.   Lungs: Normal breathing effort, no conversational dyspnea.   ASSESSMENT AND PLAN  TREATMENT PLAN FOR OBESITY:  Recommended Dietary Goals  Marissa Joseph is currently in the action stage of change. As such, her goal is to continue weight management plan. She  has agreed to the Category 3 Plan.  Behavioral Intervention  We discussed the following Behavioral Modification Strategies today: increasing lean protein intake to established goals, increasing fiber rich foods, increasing water intake , work on meal planning and preparation, work on tracking and journaling calories using tracking application, keeping healthy foods at home, decreasing eating out or consumption of processed foods, and making healthy choices when eating convenient foods, practice mindfulness eating and understand the difference between hunger signals and cravings, work on managing stress, creating time for self-care and relaxation, avoiding temptations and identifying enticing environmental cues, planning for success, and continue to work on maintaining a reduced calorie state, getting the recommended amount of protein, incorporating whole foods, making healthy choices, staying well hydrated and practicing mindfulness when eating.. Reviewed dietary change goals on AVS  Additional resources provided today: NA  Recommended Physical Activity Goals  Marissa Joseph has been advised to work up to 150 minutes of moderate intensity aerobic activity a week and strengthening exercises 2-3 times per week for cardiovascular health, weight loss maintenance and preservation of muscle mass.   She has agreed to Start aerobic activity with a goal of 150 minutes a week at moderate intensity.  Reviewed importance of adding in more daily steps Her sedentary lifestyle is a barrier to her weight progression  Pharmacotherapy changes for the treatment of obesity: none  ASSOCIATED CONDITIONS ADDRESSED TODAY  OSA (obstructive sleep apnea) Assessment & Plan: A referral was made to Dr. Burnard at her last visit but she has not yet been called to schedule.  She has a history of OSA currently not on CPAP or BiPAP.  Her sleep remains poor and her energy levels have been suffering.  Will follow-up on referral to Dr.  Burnard.  Continue active plan for weight reduction Aim for 7 to 8 hours of quality sleep at night   Class 3 severe obesity due to excess calories with serious comorbidity and body mass index (BMI) of 60.0 to 69.9 in adult [facility]) Assessment & Plan: Patient has had slow progress with weight reduction over the past 4 months of medically supervised weight management, down 4 pounds in total.  She has struggled with compliance on her prescribed meal plan and dietary logging.  She has improved control of her appetite and cravings on Lomaira  8 mg twice daily.  She lacks insurance coverage for GLP-1 receptor agonist.  With her high BMI and lifetime history of obesity, we have discussed the option for bariatric surgery which she declined at this point in time.  Continue to work on consistency with prescribed dietary plan, eating on a schedule, meal planning.  Will closely follow blood pressure and heart rate on Lomaira  8 mg twice daily.  Orders: -     Lomaira ; Take 1 tablet (8 mg total) by mouth 2 (two) times daily. Take 30 minutes before first meal of the day and 1 hour after 2nd meal of the day.  Dispense: 60 tablet; Refill: 0  Vitamin D  deficiency  Essential hypertension Assessment & Plan: Her blood pressure is mildly elevated today with a diastolic of 90.  She reports good compliance taking Bystolic  2.5 mg once daily, Benicar  HCT 20/12.5 mg once daily.  She was recently on a round of prednisone  for influenza.  She has been monitoring her blood pressure readings outside of our office and has been running systolics in the 120s.  Continue all antihypertensive medication.  Will watch for rising blood pressure readings on Lomaira .  Continue active plan for weight reduction.       She was informed of the importance of frequent follow up visits to maximize her success with intensive lifestyle modifications for her multiple health conditions.   ATTESTASTION STATEMENTS:  Reviewed by clinician on day  of visit: allergies, medications, problem list, medical history, surgical history, family history, social history, and previous encounter notes pertinent to obesity diagnosis.   I have personally spent 30 minutes total time today in preparation, patient care, nutritional counseling and documentation for this visit, including the following: review of clinical lab tests; review of medical tests/procedures/services.      Marissa FORBES Haddock, DO DABFM, DABOM [facility] [address] [place], [place] [address] [phone]

## 2023-07-11 NOTE — Assessment & Plan Note (Signed)
 A referral was made to Dr. Burnard at her last visit but she has not yet been called to schedule.  She has a history of OSA currently not on CPAP or BiPAP.  Her sleep remains poor and her energy levels have been suffering.  Will follow-up on referral to Dr. Burnard.  Continue active plan for weight reduction Aim for 7 to 8 hours of quality sleep at night

## 2023-07-11 NOTE — Assessment & Plan Note (Signed)
 Her blood pressure is mildly elevated today with a diastolic of 90.  She reports good compliance taking Bystolic  2.5 mg once daily, Benicar  HCT 20/12.5 mg once daily.  She was recently on a round of prednisone  for influenza.  She has been monitoring her blood pressure readings outside of our office and has been running systolics in the 120s.  Continue all antihypertensive medication.  Will watch for rising blood pressure readings on Lomaira .  Continue active plan for weight reduction.

## 2023-07-15 ENCOUNTER — Encounter: Payer: Self-pay | Admitting: Internal Medicine

## 2023-07-15 NOTE — Progress Notes (Signed)
 Subjective:    Patient ID: Marissa Joseph, female    DOB: [birth year], 43 y.o.   MRN: [id number]     HPI Zohara is here for follow up of her chronic medical problems.  Just had flu.  Still has cough.  Mucus is deep in her lungs.    Having achy bones, lower back pain, constipation.  She thinks the constipation is causing the lower back pain.     Medications and allergies reviewed with patient and updated if appropriate.  Current Outpatient Medications on File Prior to Visit  Medication Sig Dispense Refill   albuterol  (VENTOLIN  HFA) 108 (90 Base) MCG/ACT inhaler Inhale 1-2 puffs into the lungs every 6 (six) hours as needed for wheezing 6.7 g 5   benzonatate  (TESSALON ) 100 MG capsule Take 1 capsule (100 mg total) by mouth every 8 (eight) hours. 21 capsule 0   bisacodyl (DULCOLAX) 5 MG EC tablet Take 1 tablet (5 mg total) by mouth daily as needed for moderate constipation. 30 tablet 0   budesonide -formoterol  (SYMBICORT ) 80-4.5 MCG/ACT inhaler Inhale 2 puffs into the lungs 2 (two) times daily. 10.2 g 3   buPROPion  (WELLBUTRIN  XL) 150 MG 24 hr tablet Take 1 tablet (150 mg total) by mouth daily. 30 tablet 5   diclofenac  Sodium (VOLTAREN ) 1 % GEL Apply 4 g topically 4 (four) times daily as needed. 150 g 0   EPINEPHrine  0.3 mg/0.3 mL IJ SOAJ injection Inject 0.3 mg into the muscle as needed for anaphylaxis. 2 each 0   furosemide  (LASIX ) 20 MG tablet Take 1 tablet (20 mg total) by mouth daily. 90 tablet 1   gabapentin  (NEURONTIN ) 100 MG capsule Take 2 capsules (200 mg total) by mouth at bedtime. 60 capsule 5   indomethacin  (INDOCIN ) 50 MG capsule TAKE 1 CAPSULE BY MOUTH THREE TIMES DAILY WITH MEALS     lidocaine  (LIDODERM ) 5 % Place 1 patch onto the skin daily. Remove & Discard patch within 12 hours or as directed by MD 30 patch 0   montelukast  (SINGULAIR ) 10 MG tablet Take 1 tablet (10 mg total) by mouth at bedtime. 90 tablet 1   nebivolol  (BYSTOLIC ) 2.5 MG tablet Take 1 tablet  (2.5 mg total) by mouth daily. 30 tablet 5   olmesartan -hydrochlorothiazide  (BENICAR  HCT) 20-12.5 MG tablet Take 1 tablet by mouth daily. 90 tablet 1   Phentermine  HCl (LOMAIRA ) 8 MG TABS Take 1 tablet (8 mg total) by mouth 2 (two) times daily. Take 30 minutes before first meal of the day and 1 hour after 2nd meal of the day. 60 tablet 0   polyethylene glycol powder (GLYCOLAX /MIRALAX ) 17 GM/SCOOP powder Take 17 g by mouth 2 (two) times daily as needed for severe constipation. 238 g 1   triamcinolone  cream (KENALOG ) 0.1 % Apply 1 Application topically 2 (two) times daily. 30 g 5   Vitamin D , Ergocalciferol , (DRISDOL ) 1.25 MG (50000 UNIT) CAPS capsule Take 1 capsule (50,000 Units total) by mouth every 7 (seven) days. 5 capsule 0   No current facility-administered medications on file prior to visit.     Review of Systems  Constitutional:  Negative for fever.  Respiratory:  Positive for cough (from recent Flu) and shortness of breath (at times - chronic). Negative for wheezing (with the flu - resolved).   Cardiovascular:  Positive for leg swelling. Negative for chest pain and palpitations.  Neurological:  Positive for headaches. Negative for light-headedness.       Objective:  Vitals:   07/16/23 1118  BP: 132/84  Pulse: 91  Temp: 98.2 F (36.8 C)  SpO2: 97%   BP Readings from Last 3 Encounters:  07/16/23 132/84  07/11/23 (!) 137/90  07/04/23 129/86   Wt Readings from Last 3 Encounters:  07/16/23 (!) 371 lb (168.3 kg)  07/11/23 (!) 365 lb (165.6 kg)  07/04/23 (!) 373 lb (169.2 kg)   Body mass index is 63.68 kg/m.    Physical Exam Constitutional:      General: She is not in acute distress.    Appearance: Normal appearance.  HENT:     Head: Normocephalic and atraumatic.  Eyes:     Conjunctiva/sclera: Conjunctivae normal.  Cardiovascular:     Rate and Rhythm: Normal rate and regular rhythm.     Heart sounds: Normal heart sounds.  Pulmonary:     Effort: Pulmonary effort  is normal. No respiratory distress.     Breath sounds: Normal breath sounds. No wheezing.  Musculoskeletal:     Cervical back: Neck supple.     Right lower leg: No edema.     Left lower leg: No edema.  Lymphadenopathy:     Cervical: No cervical adenopathy.  Skin:    General: Skin is warm and dry.     Findings: No rash.  Neurological:     Mental Status: She is alert. Mental status is at baseline.  Psychiatric:        Mood and Affect: Mood normal.        Behavior: Behavior normal.        Lab Results  Component Value Date   WBC 6.2 01/12/2023   HGB 12.3 01/12/2023   HCT 38.0 01/12/2023   PLT 248.0 01/12/2023   GLUCOSE 84 01/12/2023   CHOL 127 01/12/2023   TRIG 94.0 01/12/2023   HDL 41.30 01/12/2023   LDLCALC 67 01/12/2023   ALT 14 01/12/2023   AST 17 01/12/2023   NA 137 01/12/2023   K 4.1 01/12/2023   CL 102 01/12/2023   CREATININE 0.91 01/12/2023   BUN 16 01/12/2023   CO2 31 01/12/2023   TSH 1.040 10/17/2022   HGBA1C 5.4 01/12/2023     Assessment & Plan:    See Problem List for Assessment and Plan of chronic medical problems.

## 2023-07-15 NOTE — Patient Instructions (Addendum)
   Call cardiology to check on your referral for sleep apnea - Phone: [phone]    Medications changes include :   start Benefiber 1-2 tsp 1-2 times a day  For the constipation try -  Docusate 1-3 pills a day Probiotics Metamucil fiber miralax  - which you can take daily if needed Smooth move tea Prunes, prune juice, dates or raisons Magnesium supplement over the counter    A referral was ordered for a therapist.   Return in about 6 months (around 01/13/2024) for Physical Exam.

## 2023-07-16 ENCOUNTER — Ambulatory Visit: Payer: 59 | Admitting: Internal Medicine

## 2023-07-16 VITALS — BP 132/84 | HR 91 | Temp 98.2°F | Ht 64.0 in | Wt 371.0 lb

## 2023-07-16 DIAGNOSIS — K5900 Constipation, unspecified: Secondary | ICD-10-CM

## 2023-07-16 DIAGNOSIS — R7303 Prediabetes: Secondary | ICD-10-CM | POA: Diagnosis not present

## 2023-07-16 DIAGNOSIS — R600 Localized edema: Secondary | ICD-10-CM

## 2023-07-16 DIAGNOSIS — F4323 Adjustment disorder with mixed anxiety and depressed mood: Secondary | ICD-10-CM | POA: Diagnosis not present

## 2023-07-16 DIAGNOSIS — M545 Low back pain, unspecified: Secondary | ICD-10-CM | POA: Diagnosis not present

## 2023-07-16 DIAGNOSIS — J4520 Mild intermittent asthma, uncomplicated: Secondary | ICD-10-CM | POA: Diagnosis not present

## 2023-07-16 DIAGNOSIS — G8929 Other chronic pain: Secondary | ICD-10-CM | POA: Diagnosis not present

## 2023-07-16 DIAGNOSIS — I10 Essential (primary) hypertension: Secondary | ICD-10-CM

## 2023-07-16 DIAGNOSIS — I872 Venous insufficiency (chronic) (peripheral): Secondary | ICD-10-CM

## 2023-07-16 NOTE — Assessment & Plan Note (Addendum)
 Chronic Lab Results  Component Value Date   HGBA1C 5.4 01/12/2023    Low sugar / carb diet Stressed regular exercise

## 2023-07-16 NOTE — Assessment & Plan Note (Addendum)
 Chronic Taking miralax  daily Trying to drink more water Stressed regular exercise Start benefiber 2 tsp twice a day Discussed prunes, smooth move tea, prebiotic/probiotics Advised that she needs to try different things to see what can help and she will likely need a combination of a couple of different things

## 2023-07-16 NOTE — Assessment & Plan Note (Signed)
 Chronic Mild, intermittent Controlled Continue Symbicort  twice daily as needed and continue albuterol  as needed Continue Singulair  10 mg nightly

## 2023-07-16 NOTE — Assessment & Plan Note (Addendum)
 Chronic Blood pressure well controlled Continue nebivolol  2.5 mg daily, Benicar  HCT 20-12.5 mg daily

## 2023-07-16 NOTE — Assessment & Plan Note (Addendum)
 Chronic Controlled She does find herself more emotional and is not sure why Continue bupropion  XL 150 mg daily Referral to therapy

## 2023-07-16 NOTE — Assessment & Plan Note (Signed)
 Chronic Controlled  Stressed low sodium diet Continue to work on weight loss Continue as much exercise as possible Continue HCTZ 12.5 mg daily Continue Lasix  20 mg daily as needed-takes about every other day because she does not tolerate more because of increased urination so we will continue HCTZ daily

## 2023-07-16 NOTE — Assessment & Plan Note (Addendum)
 Chronic Intermittent Continue gabapentin  200 mg at bedtime as needed -she is not sure if this really helps-advised that she can take this as needed more if it does not help then it is best not to take it

## 2023-07-20 ENCOUNTER — Other Ambulatory Visit: Payer: Self-pay

## 2023-07-20 ENCOUNTER — Other Ambulatory Visit (HOSPITAL_BASED_OUTPATIENT_CLINIC_OR_DEPARTMENT_OTHER): Payer: Self-pay

## 2023-07-20 ENCOUNTER — Other Ambulatory Visit: Payer: Self-pay | Admitting: Internal Medicine

## 2023-07-20 MED ORDER — BUPROPION HCL ER (XL) 150 MG PO TB24
150.0000 mg | ORAL_TABLET | Freq: Every day | ORAL | 5 refills | Status: DC
  Filled 2023-07-20: qty 30, 30d supply, fill #0
  Filled 2023-08-25 – 2023-08-27 (×2): qty 30, 30d supply, fill #1
  Filled 2023-09-29: qty 30, 30d supply, fill #2
  Filled 2023-10-26: qty 30, 30d supply, fill #3
  Filled 2023-11-30: qty 30, 30d supply, fill #4
  Filled 2023-12-31: qty 30, 30d supply, fill #5

## 2023-07-20 MED ORDER — OLMESARTAN MEDOXOMIL-HCTZ 20-12.5 MG PO TABS
1.0000 | ORAL_TABLET | Freq: Every day | ORAL | 1 refills | Status: DC
  Filled 2023-07-20: qty 30, 30d supply, fill #0
  Filled 2023-08-25 – 2023-08-27 (×2): qty 30, 30d supply, fill #1
  Filled 2023-09-29: qty 30, 30d supply, fill #2
  Filled 2023-10-26: qty 30, 30d supply, fill #3
  Filled 2023-11-30: qty 30, 30d supply, fill #4
  Filled 2023-12-31: qty 30, 30d supply, fill #5

## 2023-07-20 MED ORDER — NEBIVOLOL HCL 2.5 MG PO TABS
2.5000 mg | ORAL_TABLET | Freq: Every day | ORAL | 5 refills | Status: DC
  Filled 2023-07-20: qty 30, 30d supply, fill #0
  Filled 2023-08-25 – 2023-08-27 (×2): qty 30, 30d supply, fill #1
  Filled 2023-09-29: qty 30, 30d supply, fill #2
  Filled 2023-10-26: qty 30, 30d supply, fill #3
  Filled 2023-11-30: qty 30, 30d supply, fill #4
  Filled 2023-12-31: qty 30, 30d supply, fill #5

## 2023-07-24 ENCOUNTER — Encounter: Payer: Self-pay | Admitting: Internal Medicine

## 2023-07-25 ENCOUNTER — Other Ambulatory Visit (HOSPITAL_BASED_OUTPATIENT_CLINIC_OR_DEPARTMENT_OTHER): Payer: Self-pay

## 2023-07-25 MED ORDER — DOXYCYCLINE HYCLATE 100 MG PO TABS
100.0000 mg | ORAL_TABLET | Freq: Two times a day (BID) | ORAL | 0 refills | Status: AC
  Filled 2023-07-25: qty 20, 10d supply, fill #0

## 2023-07-28 ENCOUNTER — Other Ambulatory Visit (HOSPITAL_BASED_OUTPATIENT_CLINIC_OR_DEPARTMENT_OTHER): Payer: Self-pay

## 2023-08-08 ENCOUNTER — Ambulatory Visit: Payer: 59 | Admitting: Family Medicine

## 2023-08-08 NOTE — Addendum Note (Signed)
 Addended by: Glennis Brink on: 08/08/2023 12:47 PM   Modules accepted: Orders

## 2023-08-13 ENCOUNTER — Ambulatory Visit: Admitting: Family Medicine

## 2023-08-13 ENCOUNTER — Other Ambulatory Visit (HOSPITAL_BASED_OUTPATIENT_CLINIC_OR_DEPARTMENT_OTHER): Payer: Self-pay

## 2023-08-13 ENCOUNTER — Encounter: Payer: Self-pay | Admitting: Family Medicine

## 2023-08-13 ENCOUNTER — Other Ambulatory Visit: Payer: Self-pay

## 2023-08-13 VITALS — BP 138/91 | HR 82 | Temp 98.3°F | Ht 64.0 in | Wt 369.0 lb

## 2023-08-13 DIAGNOSIS — E559 Vitamin D deficiency, unspecified: Secondary | ICD-10-CM | POA: Diagnosis not present

## 2023-08-13 DIAGNOSIS — G4733 Obstructive sleep apnea (adult) (pediatric): Secondary | ICD-10-CM

## 2023-08-13 DIAGNOSIS — Z6841 Body Mass Index (BMI) 40.0 and over, adult: Secondary | ICD-10-CM | POA: Diagnosis not present

## 2023-08-13 DIAGNOSIS — E66813 Obesity, class 3: Secondary | ICD-10-CM | POA: Diagnosis not present

## 2023-08-13 DIAGNOSIS — I10 Essential (primary) hypertension: Secondary | ICD-10-CM | POA: Diagnosis not present

## 2023-08-13 DIAGNOSIS — F419 Anxiety disorder, unspecified: Secondary | ICD-10-CM | POA: Diagnosis not present

## 2023-08-13 DIAGNOSIS — F32A Depression, unspecified: Secondary | ICD-10-CM | POA: Diagnosis not present

## 2023-08-13 MED ORDER — LOMAIRA 8 MG PO TABS
8.0000 mg | ORAL_TABLET | Freq: Two times a day (BID) | ORAL | 0 refills | Status: DC
  Filled 2023-08-13 – 2023-09-29 (×2): qty 60, 30d supply, fill #0

## 2023-08-13 NOTE — Progress Notes (Signed)
 Office: [phone]  /  Fax: [phone]  WEIGHT SUMMARY AND BIOMETRICS  Starting Date: 03/08/23  Starting Weight: 369lb   Weight Lost Since Last Visit: 0lb   Vitals Temp: 98.3 F (36.8 C) BP: (!) 138/91 Pulse Rate: 82 SpO2: 97 %   Body Composition  Body Fat %: 63.3 % Fat Mass (lbs): 234 lbs Muscle Mass (lbs): 128.8 lbs Visceral Fat Rating : 28     HPI  Chief Complaint: OBESITY  Marissa Joseph is here to discuss her progress with her obesity treatment plan. She is on the the Category 3 Plan and states she is following her eating plan approximately 40 % of the time. She states she is exercising 25 minutes 2 times per week.   Interval History:  Since last office visit she is up 1 lb This gives her a net weight loss of 1 lb in 5 mos She denies overeating and has been emotionally stressed She has been eating more comfort foods and chips She has been struggling with meal planning/ meal prep She hasn't been working out  She is working 2nd shift looking for other options R ankle pain has limited her exercise She plans to resume swimming at [employer] and cravings have improved on Lomairawithout adverse SE She has had improvements in leg edema  Pharmacotherapy: Lomaira 8 mg bid  PHYSICAL EXAM:  Blood pressure (!) 138/91, pulse 82, temperature 98.3 F (36.8 C), height 5\' 4"  (1.626 m), weight (!) 369 lb (167.4 kg), SpO2 97%. Body mass index is 63.34 kg/m.  General: She is overweight, cooperative, alert, well developed, and in no acute distress. PSYCH: Has normal mood, affect and thought process.  Tearful at times Lungs: Normal breathing effort, no conversational dyspnea.   ASSESSMENT AND PLAN  TREATMENT PLAN FOR OBESITY:  Recommended Dietary Goals  Marissa Joseph is currently in the action stage of change. As such, her goal is to continue weight management plan. She has agreed to keeping a food journal and adhering to recommended goals of 1600 calories  and 100 g of  protein.  Behavioral Intervention  We discussed the following Behavioral Modification Strategies today: increasing lean protein intake to established goals, increasing fiber rich foods, increasing water intake , work on meal planning and preparation, work on Counselling psychologist calories using tracking application, keeping healthy foods at home, practice mindfulness eating and understand the difference between hunger signals and cravings, work on managing stress, creating time for self-care and relaxation, avoiding temptations and identifying enticing environmental cues, planning for success, and continue to work on maintaining a reduced calorie state, getting the recommended amount of protein, incorporating whole foods, making healthy choices, staying well hydrated and practicing mindfulness when eating..  Additional resources provided today: NA  Recommended Physical Activity Goals  Marissa Joseph has been advised to work up to 150 minutes of moderate intensity aerobic activity a week and strengthening exercises 2-3 times per week for cardiovascular health, weight loss maintenance and preservation of muscle mass.   She has agreed to Start aerobic activity with a goal of 150 minutes a week at moderate intensity.   Pharmacotherapy changes for the treatment of obesity: none  ASSOCIATED CONDITIONS ADDRESSED TODAY  OSA (obstructive sleep apnea) She has a hx of OSA, intolerant to CPAP Continues to struggle with weight reduction Ref to Neuro placed for further eval and treatment Consider WLS or Zepbound if not improving  Class 3 severe obesity due to excess calories with serious comorbidity and body mass index (BMI)  of 60.0 to 69.9 in adult (HCC) DBP slightly high Taking all listed anti hypertensive agents and has improved appetite on Lomaira w/o adverse SE If < 3 lb of weight loss in 1 month, will discontinue Recommend dietary tracking app that you can take pics of food to track, list  given Work on meal planning dinners Check out Nutrition U at [employer] Resume water exercise at [facility] with a goal of 2 x a week -     Lomaira; Take 1 tablet (8 mg total) by mouth 2 (two) times daily. Take 30 minutes before first meal of the day and 1 hour after 2nd meal of the day.  Dispense: 60 tablet; Refill: 0  Vitamin D deficiency Last vitamin D Lab Results  Component Value Date   VD25OH 58.2 06/14/2023   Changed to OTC vitamin D 2,000 international units  daily Essential hypertension Denies CP or HA.  Leg edema improving Taking bystolic 2.5 mg daily and Benicar- HCT 20-12.5 mg daily Continue current BP meds Continue active plan for weight reduction  Anxiety and depression Worsened with a rise in emotional eating She is grieving the loss of her aunt (passed 2 years ago) Has family for support Job stress and lack of sleep are factors On Wellbutrin XL 150 mg daily  F/u with PCP for further eval and treatment Work on stress reduction, self care      She was informed of the importance of frequent follow up visits to maximize her success with intensive lifestyle modifications for her multiple health conditions.   ATTESTASTION STATEMENTS:  Reviewed by clinician on day of visit: allergies, medications, problem list, medical history, surgical history, family history, social history, and previous encounter notes pertinent to obesity diagnosis.   I have personally spent 30 minutes total time today in preparation, patient care, nutritional counseling and documentation for this visit, including the following: review of clinical lab tests; review of medical tests/procedures/services.      Glennis Brink, DO DABFM, DABOM [facility] [address] [place], [place] [address] [phone]

## 2023-08-16 ENCOUNTER — Other Ambulatory Visit (HOSPITAL_BASED_OUTPATIENT_CLINIC_OR_DEPARTMENT_OTHER): Payer: Self-pay

## 2023-08-23 ENCOUNTER — Other Ambulatory Visit (HOSPITAL_BASED_OUTPATIENT_CLINIC_OR_DEPARTMENT_OTHER): Payer: Self-pay

## 2023-08-27 ENCOUNTER — Other Ambulatory Visit (HOSPITAL_BASED_OUTPATIENT_CLINIC_OR_DEPARTMENT_OTHER): Payer: Self-pay

## 2023-08-28 ENCOUNTER — Other Ambulatory Visit (HOSPITAL_COMMUNITY): Payer: Self-pay

## 2023-08-28 ENCOUNTER — Other Ambulatory Visit (HOSPITAL_BASED_OUTPATIENT_CLINIC_OR_DEPARTMENT_OTHER): Payer: Self-pay

## 2023-08-30 ENCOUNTER — Ambulatory Visit: Admitting: Neurology

## 2023-08-30 ENCOUNTER — Other Ambulatory Visit (HOSPITAL_BASED_OUTPATIENT_CLINIC_OR_DEPARTMENT_OTHER): Payer: Self-pay

## 2023-08-30 ENCOUNTER — Encounter: Payer: Self-pay | Admitting: Neurology

## 2023-08-30 VITALS — BP 126/86 | HR 73 | Ht 64.0 in | Wt 371.0 lb

## 2023-08-30 DIAGNOSIS — E66813 Obesity, class 3: Secondary | ICD-10-CM

## 2023-08-30 DIAGNOSIS — G4726 Circadian rhythm sleep disorder, shift work type: Secondary | ICD-10-CM | POA: Insufficient documentation

## 2023-08-30 DIAGNOSIS — J301 Allergic rhinitis due to pollen: Secondary | ICD-10-CM | POA: Diagnosis not present

## 2023-08-30 DIAGNOSIS — J4521 Mild intermittent asthma with (acute) exacerbation: Secondary | ICD-10-CM | POA: Diagnosis not present

## 2023-08-30 DIAGNOSIS — F17290 Nicotine dependence, other tobacco product, uncomplicated: Secondary | ICD-10-CM | POA: Diagnosis not present

## 2023-08-30 DIAGNOSIS — Z6841 Body Mass Index (BMI) 40.0 and over, adult: Secondary | ICD-10-CM | POA: Diagnosis not present

## 2023-08-30 DIAGNOSIS — I872 Venous insufficiency (chronic) (peripheral): Secondary | ICD-10-CM | POA: Diagnosis not present

## 2023-08-30 MED ORDER — ALPRAZOLAM 0.5 MG PO TABS
0.5000 mg | ORAL_TABLET | Freq: Every evening | ORAL | 0 refills | Status: DC | PRN
  Filled 2023-08-30 – 2023-09-17 (×2): qty 2, 2d supply, fill #0

## 2023-08-30 NOTE — Progress Notes (Addendum)
 SLEEP MED[facility]INE CLINIC    Provider:  Melvyn Novas, MD  Primary Care Physician:  Marissa Sanes, MD [address] [place] [address]     Referring Provider: Mart Joseph [address] [place],  [place] [address]          Chief Complaint according to patient   Patient presents with:     New Patient (Initial Visit)           HISTORY OF PRESENT ILLNESS:  Marissa Joseph is a 43 y.o. female patient who is seen here upon  referral by her physician at [facility] - on 08/30/2023  for an evaluation of apnea in the setting of poor sleep quality, witnessed apneas and snoring and obesity. BMI of 63.7 .  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  She was later admitted to hospital with pneumonia, asthma and cellulitis ( 2013 ) , there was put on biPAP . Not after sleep study because she had no insurance coverage.  She was again discharged without BiPAP.    Sleep relevant medical history: superobesity - asthma and excema-  gained wight after prednisone treatment beginning in 9 th grade. ,  Cellulitis, open wound left  leg , non healing  spider bite left leg, obesity hypoventilation, No ENT surgery, concussion in daycare at age 87 , Asthma, actively vaping,     Family medical /sleep history: no other family member on CPAP with OSA.   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 The patient currently works in shifts( Chief Technology Officer,) Pets are ** present. Tobacco use; vaping - daily .   ETOH use ; quit ,  Caffeine intake in form of  Coffee( 1 -2 cup a day) Soda( /) Tea ( /) or energy drinks Exercise in form of gym. .      Sleep habits are as follows: Marissa Joseph is working the hours between 3 PM and 11 PM currently but she has a almost 1 hour distance before she will come home.  So she arrives home between 1230 and 1 AM she will let the dog out, cleans her home, her TV will be set to a time a and usually she may be asleep by about 3:30 AM she has 1 bathroom break usually around 7 to 8 AM she will rise no later than 9 AM, by 11 AM she has her main meal which is breakfast these days.  She is currently daily visiting the gym at 10 AM,    Rarely dreaming these days,  sleeping recliner and prone or laterally, sleeps on 2 - 3 pillows, with body pillow.  No GERD. Sleeping restlessly,  She reports not feeling refreshed or restored in AM, with symptoms such as dry mouth, some morning headaches, and residual fatigue.  She wakes with headaches but is not woken by HA.  Naps are taken very frequently, lasting from 15  to 20 minutes- these are  refreshing.     Review of Systems: Out of a complete 14 system review, the patient complains of only the following symptoms, and all other reviewed systems are negative.:  Fatigue, sleepiness , snoring, fragmented sleep,     How likely are you to doze in the following situations: 0 = not likely, 1 = slight chance, 2 = moderate chance, 3 = high chance   Sitting and Reading? Watching Television? Sitting inactive in a public place (theater or meeting)? As a passenger in a car for an hour without a break? Lying down in the afternoon when circumstances permit? Sitting and talking to someone? Sitting quietly after lunch without alcohol? In a car, while stopped for a few minutes in traffic?   Total = 12/ 24 points  on Phentermine medication.   FSS endorsed at 54/ 63 points.    SHIFT WORK  Severe obesity with asthma and hypoventilation.  Apnea dx in 2008.   Social History   Socioeconomic  History   Marital status: Single    Spouse name: Not on file   Number of children: Not on file   Years of education: Not on file   Highest education level: Not on file  Occupational History   Not on file  Tobacco Use   Smoking status: Former    Current packs/day: 0.20    Average packs/day: 0.2 packs/day for 5.0 years (1.0 ttl pk-yrs)    Types: Cigarettes   Smokeless tobacco: Former   Tobacco comments:    Education administrator Use   Vaping status: Every Day  Substance and Sexual Activity   Alcohol use: Not Currently    Comment: occ   Drug use: Yes    Types: Marijuana    Comment: pot-about 1 joint BID   Sexual activity: Yes    Birth control/protection: Condom  Other Topics Concern   Not on file  Social History Narrative   Not on file   Social Drivers of Health   Financial Resource Strain: High Risk (01/02/2022)   Overall Financial Resource Strain (CARDIA)    Difficulty of Paying Living Expenses: Hard  Food Insecurity: Food Insecurity Present (09/27/2022)   Hunger Vital Sign    Worried About Running Out of Food in the Last Year: Often true    Ran Out of Food in the Last Year: Often true  Transportation Needs: No Transportation Needs (08/17/2022)   PRAPARE - Administrator, Civil Service (Medical): No    Lack of Transportation (Non-Medical): No  Physical Activity: Not on file  Stress: Stress Concern Present (01/02/2022)   [employer] of Occupational Health - Occupational Stress Questionnaire    Feeling of Stress : Rather much  Social Connections: Unknown (03/10/2022)   [facility], [facility]   Social Network    Social Network: Not on file    Family History  Problem Relation Age of Onset   Diabetes Mother    Diabetes Father    Breast cancer Maternal Uncle    Cancer Maternal Uncle        prostate   Breast cancer Paternal Aunt    Cancer Paternal Aunt        lung   Heart disease Maternal Grandmother    Diabetes Maternal Grandmother     Stroke Maternal Grandfather     Past Medical History:  Diagnosis Date   ADD (attention deficit disorder)    Anxiety    no official dx  Asthma    Back pain    Depression    Eczema    Edema    Fibroids    GONORRHEA 03/25/2008   Qualifier: Diagnosis of  By: Levon Hedger     Heart murmur    Heart murmur    Hypertension    Joint pain    MRSA (methicillin resistant staph aureus) culture positive 05/2012   cellulits/ left leg   Multiple food allergies    Obesity    Sleep apnea     Past Surgical History:  Procedure Laterality Date   NO PAST SURGERIES     TEE WITHOUT CARDIOVERSION N/A 12/22/2019   Procedure: TRANSESOPHAGEAL ECHOCARDIOGRAM (TEE);  Surgeon: Little Ishikawa, MD;  Location: [facility] ENDOSCOPY;  Service: Cardiovascular;  Laterality: N/A;     Current Outpatient Medications on File Prior to Visit  Medication Sig Dispense Refill   albuterol (VENTOLIN HFA) 108 (90 Base) MCG/ACT inhaler Inhale 1-2 puffs into the lungs every 6 (six) hours as needed for wheezing 6.7 g 5   benzonatate (TESSALON) 100 MG capsule Take 1 capsule (100 mg total) by mouth every 8 (eight) hours. 21 capsule 0   budesonide-formoterol (SYMBICORT) 80-4.5 MCG/ACT inhaler Inhale 2 puffs into the lungs 2 (two) times daily. 10.2 g 3   buPROPion (WELLBUTRIN XL) 150 MG 24 hr tablet Take 1 tablet (150 mg total) by mouth daily. 30 tablet 5   diclofenac Sodium (VOLTAREN) 1 % GEL Apply 4 g topically 4 (four) times daily as needed. 150 g 0   EPINEPHrine 0.3 mg/0.3 mL IJ SOAJ injection Inject 0.3 mg into the muscle as needed for anaphylaxis. 2 each 0   furosemide (LASIX) 20 MG tablet Take 1 tablet (20 mg total) by mouth daily. 90 tablet 1   gabapentin (NEURONTIN) 100 MG capsule Take 2 capsules (200 mg total) by mouth at bedtime. 60 capsule 5   indomethacin (INDOCIN) 50 MG capsule TAKE 1 CAPSULE BY MOUTH THREE TIMES DAILY WITH MEALS     lidocaine (LIDODERM) 5 % Place 1 patch onto the skin daily. Remove & Discard  patch within 12 hours or as directed by MD 30 patch 0   montelukast (SINGULAIR) 10 MG tablet Take 1 tablet (10 mg total) by mouth at bedtime. 90 tablet 1   nebivolol (BYSTOLIC) 2.5 MG tablet Take 1 tablet (2.5 mg total) by mouth daily. 30 tablet 5   olmesartan-hydrochlorothiazide (BENICAR HCT) 20-12.5 MG tablet Take 1 tablet by mouth daily. 90 tablet 1   Phentermine HCl (LOMAIRA) 8 MG TABS Take 1 tablet (8 mg total) by mouth 2 (two) times daily. Take 30 minutes before first meal of the day and 1 hour after 2nd meal of the day. 60 tablet 0   polyethylene glycol powder (GLYCOLAX/MIRALAX) 17 GM/SCOOP powder Take 17 g by mouth 2 (two) times daily as needed for severe constipation. 238 g 1   triamcinolone cream (KENALOG) 0.1 % Apply 1 Application topically 2 (two) times daily. 30 g 5   bisacodyl (DULCOLAX) 5 MG EC tablet Take 1 tablet (5 mg total) by mouth daily as needed for moderate constipation. (Patient not taking: Reported on 08/30/2023) 30 tablet 0   No current facility-administered medications on file prior to visit.    Allergies  Allergen Reactions   Avocado Other (See Comments)    Throat itching and swelling   Shellfish Allergy Hives, Shortness Of Breath and Swelling     DIAGNOSTIC DATA (LABS, IMAGING, TESTING) - I reviewed patient records, labs, notes,  testing and imaging myself where available.  Lab Results  Component Value Date   WBC 6.2 01/12/2023   HGB 12.3 01/12/2023   HCT 38.0 01/12/2023   MCV 95.0 01/12/2023   PLT 248.0 01/12/2023      Component Value Date/Time   NA 137 01/12/2023 1107   K 4.1 01/12/2023 1107   CL 102 01/12/2023 1107   CO2 31 01/12/2023 1107   GLUCOSE 84 01/12/2023 1107   BUN 16 01/12/2023 1107   CREATININE 0.91 01/12/2023 1107   CREATININE 0.87 01/23/2020 1554   CALCIUM 9.3 01/12/2023 1107   PROT 7.5 01/12/2023 1107   ALBUMIN 3.7 01/12/2023 1107   AST 17 01/12/2023 1107   ALT 14 01/12/2023 1107   ALKPHOS 66 01/12/2023 1107   BILITOT 0.4  01/12/2023 1107   GFRNONAA >60 08/08/2022 2059   GFRNONAA 85 01/23/2020 1554   GFRAA 98 01/23/2020 1554   Lab Results  Component Value Date   CHOL 127 01/12/2023   HDL 41.30 01/12/2023   LDLCALC 67 01/12/2023   TRIG 94.0 01/12/2023   CHOLHDL 3 01/12/2023   Lab Results  Component Value Date   HGBA1C 5.4 01/12/2023   Lab Results  Component Value Date   VITAMINB12 1,169 03/08/2023   Lab Results  Component Value Date   TSH 1.040 10/17/2022    PHYSICAL EXAM:  Today's Vitals   08/30/23 1105  BP: 126/86  Pulse: 73  Weight: (!) 371 lb (168.3 kg)  Height: 5\' 4"  (1.626 m)   Body mass index is 63.68 kg/m.   Wt Readings from Last 3 Encounters:  08/30/23 (!) 371 lb (168.3 kg)  08/13/23 (!) 369 lb (167.4 kg)  07/16/23 (!) 371 lb (168.3 kg)     Ht Readings from Last 3 Encounters:  08/30/23 5\' 4"  (1.626 m)  08/13/23 5\' 4"  (1.626 m)  07/16/23 5\' 4"  (1.626 m)      General: The patient is awake, alert and appears not in acute distress. The patient is well groomed. Head: Normocephalic, atraumatic. Neck is supple. Mallampati 3,  neck circumference:16.5  inches .  Nasal airflow is patent.  Retrognathia is  seen.  Dental status:  biological, bruxism marks.  Cardiovascular:  Regular rate and cardiac rhythm by pulse,  without distended neck veins. Respiratory: Lungs are clear to auscultation.  Skin:  With evidence of ankle edema, no rash. Trunk: The patient's posture is erect.   NEUROLOGIC EXAM: The patient is awake and alert, oriented to place and time.   Memory subjective described as intact.  Attention span & concentration ability appears normal.  Speech is fluent,  without  dysarthria, dysphonia or aphasia.  Mood and affect are appropriate.   Cranial nerves: no loss of smell or taste reported  Pupils are equal in shape and size - and briskly reactive to light. Funduscopic exam deferred.  Extraocular movements in vertical and horizontal planes were intact , lazy eye on  the right-  without nystagmus. Reports  Diplopia when reading on a screen.not here.  Visual fields by finger perimetry are intact. Hearing was intact to soft voice and finger rubbing.    Facial sensation intact to fine touch.  Facial motor strength is symmetric and tongue and uvula move midline.  Neck ROM : rotation, tilt and flexion extension were normal for age and shoulder shrug was symmetrical.    Motor exam:  Symmetric bulk, tone and ROM.   Normal tone without cog-wheeling, symmetric grip strength .   Sensory:  Fine touch,  vibration were Proprioception tested in the upper extremities was normal.   Coordination: Rapid alternating movements in the fingers/hands were of normal speed.  The Finger-to-nose maneuver was intact without evidence of ataxia, dysmetria or tremor.   Gait and station: Patient could rise unassisted from a seated position, walked without assistive device.  Stance is of  wide base and the patient turned with 4 steps.   Deep tendon reflexes: in the  upper and lower extremities are attenuated.      ASSESSMENT AND PLAN 43 y.o. year old female  here with:      1) super obesity:  history of obesity hypoventilation and apnea,  treated briefly while hospitalized  BiPAP  22/ 18 cm water, but never had coverages for continued treatment at home. 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   She used to go to bed at 10  PM and sleep  7-8 hours, that's now 5 - 5.5 hours of total sleep time.   3) She  sleeps better when she exercised in AM  and ate her adjusted diet.   She has recovered from influenza A with constant non productive  cough  by mid -February .   Sleep hygiene discussed, TV and screen effect,  hydration,  snack and meal times.   I like for this patient to improve her sleep routines further and I will order a HST - her sleep schedule wold make an in lab study low yield.  HST will be by [facility] PAT, Please.    I ordered 0.5 mg xanax for the HST to  achieve 4 hours of sleep minimum.   I plan to follow up either personally or through our NP within 3-5  months.    I would like to thank Marissa Sanes, MD and Glennis Brink, Do [address] [place],  [place] [address] for allowing me to meet with and to take care of this pleasant patient.    After spending a total time of  45  minutes face to face and additional time for physical and neurologic examination, review of laboratory studies,  personal review of imaging studies, reports and results of other testing and review of referral information / records as far as provided in visit,   Electronically signed by: Marissa Novas, MD 08/30/2023 11:26 AM  [facility] Board certified by The [employer] of Sleep Medicine and Diplomate of the [employer] of Sleep Medicine. Board certified In Neurology through the ABPN, Fellow of the [employer] of Neurology.

## 2023-08-30 NOTE — Patient Instructions (Signed)
 ASSESSMENT AND PLAN 43 y.o. year old female  here with:      1) patient with super obesity , weight gain since middle schoool, at tleast partially attributed to steroid therapies for ASTHMA and Eczema. :   history of obesity hypoventilation and apnea,  treated briefly while hospitalized  BiPAP  22/ 18 cm water, but never had coverages for continued treatment at home. 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   She used to go to bed at 10  PM and sleep  7-8 hours, that's now 5 - 5.5 hours of total sleep time.   3) She  sleeps better when she exercised in AM  and ate her adjusted diet.   She has recovered from influenza A with constant non productive  cough  by mid -February .   Sleep hygiene discussed, TV and screen effect,  hydration,  snack and meal times.   I like for this patient to improve her sleep routines further and I will order a HST - her sleep schedule wold make an in lab study low yield.  HST will be by [facility] PAT, Please.      I plan to follow up either personally or through our NP within 3-5  months.    I would like to thank Pincus Sanes, MD and Glennis Brink, Do [address] [place],  [place] [address] for allowing me to meet with and to take care of this pleasant patient.

## 2023-09-03 ENCOUNTER — Encounter: Payer: Self-pay | Admitting: Neurology

## 2023-09-10 ENCOUNTER — Other Ambulatory Visit (HOSPITAL_BASED_OUTPATIENT_CLINIC_OR_DEPARTMENT_OTHER): Payer: Self-pay

## 2023-09-11 ENCOUNTER — Encounter: Payer: Self-pay | Admitting: Internal Medicine

## 2023-09-12 ENCOUNTER — Ambulatory Visit: Admitting: Family Medicine

## 2023-09-13 ENCOUNTER — Other Ambulatory Visit: Payer: Self-pay

## 2023-09-14 ENCOUNTER — Ambulatory Visit: Admitting: Family

## 2023-09-14 VITALS — BP 128/84 | HR 76 | Temp 97.8°F | Ht 64.0 in | Wt 371.0 lb

## 2023-09-14 DIAGNOSIS — L03116 Cellulitis of left lower limb: Secondary | ICD-10-CM

## 2023-09-14 DIAGNOSIS — R600 Localized edema: Secondary | ICD-10-CM | POA: Diagnosis not present

## 2023-09-14 DIAGNOSIS — Z6841 Body Mass Index (BMI) 40.0 and over, adult: Secondary | ICD-10-CM

## 2023-09-14 NOTE — Progress Notes (Signed)
 Acute Office Visit  Subjective:     Patient ID: Marissa Joseph, female    DOB: 1980-12-26, 43 y.o.   MRN: [id number]  Chief Complaint  Patient presents with  . Cellulitis    Patient states it started 2 days ago. Not painful. She is taking doxycycline for it, she does say it is helping. She is also having a pinch in her left knee.      HPI Patient is in today with complaints of cellulitis on her left lower leg that is improved today.  Does not have any pain.  She has a chronic history of left lower extremity cellulitis.  Has been taking doxycycline for 2 days.  Today, makes day 3.  Heat has improved.  She felt a pinch sensation in her knee and wanted to just be sure that there were no issues.  Denies any pain with walking or going up steps.  No fever or chills.  Review of Systems  Constitutional:  Negative for chills and fever.  Respiratory: Negative.    Cardiovascular:  Positive for leg swelling. Negative for chest pain and palpitations.  Musculoskeletal: Negative.   Skin:        Cellulitis leg lower leg  Neurological: Negative.   Psychiatric/Behavioral: Negative.    All other systems reviewed and are negative. Past Medical History:  Diagnosis Date  . ADD (attention deficit disorder)   . Anxiety    no official dx  . Asthma   . Back pain   . Depression   . Eczema   . Edema   . Fibroids   . GONORRHEA 03/25/2008   Qualifier: Diagnosis of  By: Levon Hedger    . Heart murmur   . Heart murmur   . Hypertension   . Joint pain   . MRSA (methicillin resistant staph aureus) culture positive 05/2012   cellulits/ left leg  . Multiple food allergies   . Obesity   . Sleep apnea     Social History   Socioeconomic History  . Marital status: Single    Spouse name: Not on file  . Number of children: Not on file  . Years of education: Not on file  . Highest education level: Not on file  Occupational History  . Not on file  Tobacco Use  . Smoking status: Former     Current packs/day: 0.20    Average packs/day: 0.2 packs/day for 5.0 years (1.0 ttl pk-yrs)    Types: Cigarettes  . Smokeless tobacco: Former  . Tobacco comments:    Vaping  Vaping Use  . Vaping status: Every Day  Substance and Sexual Activity  . Alcohol use: Not Currently    Comment: occ  . Drug use: Yes    Types: Marijuana    Comment: pot-about 1 joint BID  . Sexual activity: Yes    Birth control/protection: Condom  Other Topics Concern  . Not on file  Social History Narrative  . Not on file   Social Drivers of Health   Financial Resource Strain: High Risk (01/02/2022)   Overall Financial Resource Strain (CARDIA)   . Difficulty of Paying Living Expenses: Hard  Food Insecurity: Food Insecurity Present (09/27/2022)   Hunger Vital Sign   . Worried About Programme researcher, broadcasting/film/video in the Last Year: Often true   . Ran Out of Food in the Last Year: Often true  Transportation Needs: No Transportation Needs (08/17/2022)   PRAPARE - Transportation   . Lack of Transportation (Medical): No   .  Lack of Transportation (Non-Medical): No  Physical Activity: Not on file  Stress: Stress Concern Present (01/02/2022)   [employer] of Occupational Health - Occupational Stress Questionnaire   . Feeling of Stress : Rather much  Social Connections: Unknown (03/10/2022)   Received from [facility], [facility]   Social Network   . Social Network: Not on file  Intimate Partner Violence: Unknown (03/10/2022)   [facility], [facility]   HITS   . Physically Hurt: Not on file   . Insult or Talk Down To: Not on file   . Threaten Physical Harm: Not on file   . Scream or Curse: Not on file    Past Surgical History:  Procedure Laterality Date  . NO PAST SURGERIES    . TEE WITHOUT CARDIOVERSION N/A 12/22/2019   Procedure: TRANSESOPHAGEAL ECHOCARDIOGRAM (TEE);  Surgeon: Little Ishikawa, MD;  Location: [facility] ENDOSCOPY;  Service: Cardiovascular;  Laterality: N/A;    Family  History  Problem Relation Age of Onset  . Diabetes Mother   . Diabetes Father   . Breast cancer Maternal Uncle   . Cancer Maternal Uncle        prostate  . Breast cancer Paternal Aunt   . Cancer Paternal Aunt        lung  . Heart disease Maternal Grandmother   . Diabetes Maternal Grandmother   . Stroke Maternal Grandfather     Allergies  Allergen Reactions  . Avocado Other (See Comments)    Throat itching and swelling  . Shellfish Allergy Hives, Shortness Of Breath and Swelling    Current Outpatient Medications on File Prior to Visit  Medication Sig Dispense Refill  . albuterol (VENTOLIN HFA) 108 (90 Base) MCG/ACT inhaler Inhale 1-2 puffs into the lungs every 6 (six) hours as needed for wheezing 6.7 g 5  . ALPRAZolam (XANAX) 0.5 MG tablet Take 1 tablet (0.5 mg total) by mouth at bedtime as needed for anxiety or sleep (for sleep test use). 2 tablet 0  . benzonatate (TESSALON) 100 MG capsule Take 1 capsule (100 mg total) by mouth every 8 (eight) hours. (Patient not taking: Reported on 09/14/2023) 21 capsule 0  . bisacodyl (DULCOLAX) 5 MG EC tablet Take 1 tablet (5 mg total) by mouth daily as needed for moderate constipation. (Patient not taking: Reported on 09/14/2023) 30 tablet 0  . budesonide-formoterol (SYMBICORT) 80-4.5 MCG/ACT inhaler Inhale 2 puffs into the lungs 2 (two) times daily. 10.2 g 3  . buPROPion (WELLBUTRIN XL) 150 MG 24 hr tablet Take 1 tablet (150 mg total) by mouth daily. 30 tablet 5  . diclofenac Sodium (VOLTAREN) 1 % GEL Apply 4 g topically 4 (four) times daily as needed. 150 g 0  . EPINEPHrine 0.3 mg/0.3 mL IJ SOAJ injection Inject 0.3 mg into the muscle as needed for anaphylaxis. 2 each 0  . furosemide (LASIX) 20 MG tablet Take 1 tablet (20 mg total) by mouth daily. 90 tablet 1  . gabapentin (NEURONTIN) 100 MG capsule Take 2 capsules (200 mg total) by mouth at bedtime. 60 capsule 5  . indomethacin (INDOCIN) 50 MG capsule TAKE 1 CAPSULE BY MOUTH THREE TIMES DAILY  WITH MEALS    . lidocaine (LIDODERM) 5 % Place 1 patch onto the skin daily. Remove & Discard patch within 12 hours or as directed by MD 30 patch 0  . montelukast (SINGULAIR) 10 MG tablet Take 1 tablet (10 mg total) by mouth at bedtime. 90 tablet 1  . nebivolol (  BYSTOLIC) 2.5 MG tablet Take 1 tablet (2.5 mg total) by mouth daily. 30 tablet 5  . olmesartan-hydrochlorothiazide (BENICAR HCT) 20-12.5 MG tablet Take 1 tablet by mouth daily. 90 tablet 1  . Phentermine HCl (LOMAIRA) 8 MG TABS Take 1 tablet (8 mg total) by mouth 2 (two) times daily. Take 30 minutes before first meal of the day and 1 hour after 2nd meal of the day. 60 tablet 0  . polyethylene glycol powder (GLYCOLAX/MIRALAX) 17 GM/SCOOP powder Take 17 g by mouth 2 (two) times daily as needed for severe constipation. 238 g 1  . triamcinolone cream (KENALOG) 0.1 % Apply 1 Application topically 2 (two) times daily. 30 g 5   No current facility-administered medications on file prior to visit.    BP 128/84 (BP Location: Left Arm, Patient Position: Sitting, Cuff Size: Normal)   Pulse 76   Temp 97.8 F (36.6 C) (Oral)   Ht 5\' 4"  (1.626 m)   Wt (!) 371 lb (168.3 kg)   SpO2 96%   BMI 63.68 kg/m chart      Objective:    BP 128/84 (BP Location: Left Arm, Patient Position: Sitting, Cuff Size: Normal)   Pulse 76   Temp 97.8 F (36.6 C) (Oral)   Ht 5\' 4"  (1.626 m)   Wt (!) 371 lb (168.3 kg)   SpO2 96%   BMI 63.68 kg/m    Physical Exam Vitals and nursing note reviewed.  Constitutional:      Appearance: Normal appearance. She is obese.  Cardiovascular:     Rate and Rhythm: Normal rate and regular rhythm.     Pulses: Normal pulses.  Pulmonary:     Effort: Pulmonary effort is normal.     Breath sounds: Normal breath sounds.  Musculoskeletal:     Right lower leg: Edema present.     Left lower leg: Edema present.     Comments: 1+ pitting edema noted bilaterally to both lower extremities.  Hyperpigmentation noted to the left lower  extremity.  Pulses 2/2 pedal bilaterally.  No calf tenderness.  Skin:    General: Skin is warm and dry.  Neurological:     General: No focal deficit present.     Mental Status: She is alert and oriented to person, place, and time. Mental status is at baseline.  Psychiatric:        Mood and Affect: Mood normal.        Behavior: Behavior normal.        Thought Content: Thought content normal.   No results found for any visits on 09/14/23.      Assessment & Plan:   Problem List Items Addressed This Visit     Morbid obesity with BMI of 60.0-69.9, adult (HCC)   Cellulitis of left lower extremity - Primary   Bilateral leg edema   Call the office if symptoms worsen or persist.  Recheck as scheduled and sooner as needed.  Complete doxycycline.  Discussed weight reduction techniques to include potential surgical interventions.  Patient is currently under the care of the weight loss clinic and is not currently interested in any surgical interventions for weight reduction. No orders of the defined types were placed in this encounter.   No follow-ups on file.  Eulis Foster, FNP

## 2023-09-17 ENCOUNTER — Other Ambulatory Visit (HOSPITAL_BASED_OUTPATIENT_CLINIC_OR_DEPARTMENT_OTHER): Payer: Self-pay

## 2023-09-18 ENCOUNTER — Ambulatory Visit (INDEPENDENT_AMBULATORY_CARE_PROVIDER_SITE_OTHER): Admitting: Neurology

## 2023-09-18 DIAGNOSIS — G4733 Obstructive sleep apnea (adult) (pediatric): Secondary | ICD-10-CM

## 2023-09-18 DIAGNOSIS — J4521 Mild intermittent asthma with (acute) exacerbation: Secondary | ICD-10-CM

## 2023-09-18 DIAGNOSIS — J301 Allergic rhinitis due to pollen: Secondary | ICD-10-CM

## 2023-09-18 DIAGNOSIS — E662 Morbid (severe) obesity with alveolar hypoventilation: Secondary | ICD-10-CM

## 2023-09-18 DIAGNOSIS — E66813 Obesity, class 3: Secondary | ICD-10-CM

## 2023-09-18 DIAGNOSIS — F17290 Nicotine dependence, other tobacco product, uncomplicated: Secondary | ICD-10-CM

## 2023-09-18 DIAGNOSIS — G4726 Circadian rhythm sleep disorder, shift work type: Secondary | ICD-10-CM

## 2023-09-18 DIAGNOSIS — I872 Venous insufficiency (chronic) (peripheral): Secondary | ICD-10-CM

## 2023-09-19 NOTE — Progress Notes (Signed)
 [facility]    HOME SLEEP TEST REPORT ( by Watch PAT)   STUDY DATE:  09-19-2023   ORDERING CLINICIAN: Neomia Banner, MD  REFERRING CLINICIAN: Micky Albee , DO   CLINICAL INFORMATION/HISTORY: Marissa Joseph is a 43 y.o. female patient who is seen here upon  referral by her physician at weight and wellness - on 08/30/2023  for an evaluation of apnea in the setting of poor sleep quality, witnessed apneas and snoring and obesity. BMI of 63.7 - started on phentermine  .  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 She was later admitted to hospital with pneumonia, asthma and cellulitis ( 2013 ) and there was put on BiPAP but she had no insurance coverage and was again discharged without BiPAP.     Sleep relevant medical history: Super obesity , asthma and excema-  gained weight after prednisone  treatment beginning in 9 th grade.Cellulitis, open wound left  leg , non healing  spider bite left leg, obesity hypoventilation, concussion in daycare at age 70 , Asthma and SOB , but actively vaping.      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 The patient currently works in shifts( night/ rotating). She wakes with headaches but is not woken by HA.  Naps are taken very frequently, lasting from 15 to 20 minutes- these are refreshing.       Epworth sleepiness score: 12/ 24 points on Phentermine  medication.  FSS endorsed at 54/ 63 points.      BMI: 64 kg/m   Neck Circumference:  16.5"    FINDINGS:   Sleep Summary:   Total Recording Time (hours, min): 7 hours 1 minute      Total Sleep Time (hours, min): 5 hours 49 minutes               Percent REM (%):   34%     Sleep latency was measured at 19 minutes and REM sleep latency at only 37 minutes.  There were 53 minutes of wakefulness after sleep onset.                                 Respiratory Indices:   Calculated pAHI (per AASM  guideline): 33.6/h  , all obstructive events.                      REM pAHI: 80.5/h                                               NREM pAHI:     27.1/h  Positional AHI:   The patient slept for the majority of the night in supine position for a total of 256 minutes associated with an AHI of 35.6/h.  Sleep in the right lateral position was associated with an AHI of 30.1/h.  Snoring reached a mean volume of 42 dB which is moderately loud and was present for almost 60% of total recorded sleep time. Snoring:                                                Oxygen Saturation Statistics:   Oxygen Saturation (%) Mean:   89%             O2 Saturation Range (%):   Between the nadir at 83% and a maximum of 97%                                    O2 Saturation (minutes) <89%:   18 minutes O2 saturation (minutes ) <90%:   40 minutes         Pulse Rate Statistics:   Pulse Mean (bpm): 69 bpm               Pulse Range: Between 43 and 99 bpm indicating intermittent bradycardia.    Caveat :Please note that this home sleep test device cannot provide cardiac rhythm data.                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  The diagnosis is not just sleep apnea but obesity hypoventilation as well.  Immediate treatment requires positive airway pressure therapy, and based on the patient's report BiPAP had been used into previous situations as  CPAP failed.    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 An order for a CPAP to BiPAP titration in lab has been issued.  The patient had received a prescription for Xanax  to help her accumulate enough sleep on the home sleep test and I will repeat this order for the in-lab sleep study as well.    INTERPRETING PHYSICIAN:   Neomia Banner, MD  [facility] and [facility] Sleep Board certified by The [employer] of Sleep Medicine and Diplomate of the [employer] of Sleep Medicine. Board certified In Neurology through the ABPN, Fellow of the [employer] of Neurology.

## 2023-09-29 ENCOUNTER — Other Ambulatory Visit (HOSPITAL_BASED_OUTPATIENT_CLINIC_OR_DEPARTMENT_OTHER): Payer: Self-pay

## 2023-10-01 ENCOUNTER — Other Ambulatory Visit (HOSPITAL_BASED_OUTPATIENT_CLINIC_OR_DEPARTMENT_OTHER): Payer: Self-pay

## 2023-10-02 ENCOUNTER — Ambulatory Visit: Admitting: Family Medicine

## 2023-10-02 ENCOUNTER — Encounter: Payer: Self-pay | Admitting: Family Medicine

## 2023-10-02 ENCOUNTER — Other Ambulatory Visit (HOSPITAL_BASED_OUTPATIENT_CLINIC_OR_DEPARTMENT_OTHER): Payer: Self-pay

## 2023-10-02 VITALS — BP 135/87 | HR 71 | Temp 98.0°F | Ht 64.0 in | Wt 365.0 lb

## 2023-10-02 DIAGNOSIS — Z7289 Other problems related to lifestyle: Secondary | ICD-10-CM

## 2023-10-02 DIAGNOSIS — E66813 Obesity, class 3: Secondary | ICD-10-CM

## 2023-10-02 DIAGNOSIS — K5900 Constipation, unspecified: Secondary | ICD-10-CM | POA: Diagnosis not present

## 2023-10-02 DIAGNOSIS — Z9189 Other specified personal risk factors, not elsewhere classified: Secondary | ICD-10-CM

## 2023-10-02 DIAGNOSIS — I10 Essential (primary) hypertension: Secondary | ICD-10-CM

## 2023-10-02 DIAGNOSIS — Z6841 Body Mass Index (BMI) 40.0 and over, adult: Secondary | ICD-10-CM | POA: Diagnosis not present

## 2023-10-02 MED ORDER — POLYETHYLENE GLYCOL 3350 17 GM/SCOOP PO POWD
17.0000 g | Freq: Two times a day (BID) | ORAL | 1 refills | Status: DC | PRN
  Filled 2023-10-02: qty 238, 7d supply, fill #0

## 2023-10-02 MED ORDER — LOMAIRA 8 MG PO TABS
8.0000 mg | ORAL_TABLET | Freq: Two times a day (BID) | ORAL | 0 refills | Status: DC
  Filled 2023-10-02: qty 60, 30d supply, fill #0

## 2023-10-02 MED ORDER — DOCUSATE SODIUM 100 MG PO CAPS
100.0000 mg | ORAL_CAPSULE | Freq: Two times a day (BID) | ORAL | 0 refills | Status: AC
Start: 2023-10-02 — End: ?
  Filled 2023-10-02: qty 60, 30d supply, fill #0

## 2023-10-02 NOTE — Patient Instructions (Signed)
 Continue Lomaira  8 mg 2 x a day for appetite control  Begin COLACE 1-2 x a day (everyday) as a stool softener Move Miralax  to DAILY  Drink water/ sugar free drinks-- aim for 100 oz/ day  Try to track daily calories using a calorie tracking app-- MyFitnessPal or MyNetDiary This should include 1600 cal/ day and 120 g of protein daily  As you start to feel better add in more regular physical activity  If you chose oatmeal for a meal: 1 packet of plain oatmeal Mix with [employer]life Milk Add one serving of fruit Add 1 tablespoon of peanut butter Add one egg on the side  If you chose a smoothie as a meal replacement: In the blender-  Mix plain or low sugar greek yogurt + [employer] Milk Handful of frozen fruit Add spoonful of chia seeds or flaxseeds Add a handful of spinach (optional)

## 2023-10-02 NOTE — Progress Notes (Signed)
 Office: [phone]  /  Fax: [phone]  WEIGHT SUMMARY AND BIOMETRICS  Starting Date: 03/08/23  Starting Weight: 369lb   Weight Lost Since Last Visit: 4lb   Vitals Temp: 98 F (36.7 C) BP: 135/87 Pulse Rate: 71 SpO2: 95 %   Body Composition  Body Fat %: 62.6 % Fat Mass (lbs): 228.8 lbs Muscle Mass (lbs): 130 lbs Visceral Fat Rating : 27    HPI  Chief Complaint: OBESITY  Marissa Joseph is here to discuss her progress with her obesity treatment plan. She is on the the Category 3 Plan and states she is following her eating plan approximately 85 % of the time. She states she is doing some walking.  Interval History:  Since last office visit she is down 4 lb This gives her a net weight loss of 5 lb in 6 mos She has been having issues with constipation, chronic Using Miralax  prn She was using Lomaira  8 mg bid (not taking everyday) Appetite control better on Lomaira  She has been able to reduce sugar intake She has been working on her water intake She had cellulitis between visits and she hasn't been able to walk as much She has been having L knee pain She is up 1.2 lb of muscle mass and is down 5.2 lb of body fat since last visit  Pharmacotherapy: Lomaira  8 mg bid  PHYSICAL EXAM:  Blood pressure 135/87, pulse 71, temperature 98 F (36.7 C), height 5\' 4"  (1.626 m), weight (!) 365 lb (165.6 kg), SpO2 95%. Body mass index is 62.65 kg/m.  General: She is overweight, cooperative, alert, well developed, and in no acute distress. PSYCH: Has normal mood, affect and thought process.   Lungs: Normal breathing effort, no conversational dyspnea.   ASSESSMENT AND PLAN  TREATMENT PLAN FOR OBESITY:  Recommended Dietary Goals  Kamirra is currently in the action stage of change. As such, her goal is to continue weight management plan. She has agreed to the Category 3 Plan.  Behavioral Intervention  We discussed the following Behavioral Modification Strategies today:  increasing lean protein intake to established goals, increasing fiber rich foods, work on meal planning and preparation, keeping healthy foods at home, work on managing stress, creating time for self-care and relaxation, avoiding temptations and identifying enticing environmental cues, continue to work on implementation of reduced calorie nutritional plan, continue to practice mindfulness when eating, and continue to work on maintaining a reduced calorie state, getting the recommended amount of protein, incorporating whole foods, making healthy choices, staying well hydrated and practicing mindfulness when eating..  Additional resources provided today: NA  Recommended Physical Activity Goals  Pita has been advised to work up to 150 minutes of moderate intensity aerobic activity a week and strengthening exercises 2-3 times per week for cardiovascular health, weight loss maintenance and preservation of muscle mass.   She has agreed to Start aerobic activity with a goal of 150 minutes a week at moderate intensity.   Pharmacotherapy changes for the treatment of obesity:   ASSOCIATED CONDITIONS ADDRESSED TODAY  Sedentary lifestyle She has not been able to add in exercise this month due to cellulitis She hopes to get back into water exercise  We reviewed goals on AVS  Class 3 severe obesity due to excess calories with serious comorbidity and body mass index (BMI) of 60.0 to 69.9 in adult (HCC) -     Lomaira ; Take 1 tablet (8 mg total) by mouth 2 (two) times daily. Take 30 minutes before first meal of  the day and 1 hour after 2nd meal of the day.  Dispense: 60 tablet; Refill: 0 BP/ HR are at goal and she has improved satiety and weight loss on Lomaira   Constipation, unspecified constipation type Worsening Reviewed bowel regimen on AVS -     Polyethylene Glycol 3350 ; Take 17 g by mouth 2 (two) times daily as needed for severe constipation.  Dispense: 238 g; Refill: 1 -     Docusate Sodium; Take  1 capsule (100 mg total) by mouth 2 (two) times daily.  Dispense: 60 capsule; Refill: 0  Essential hypertension BP well controlled on Bystolic  2.5 mg daily and Olmesartan  - hydrochlorothiazide  20/12.5 mg daily Continue active plan for weight loss     She was informed of the importance of frequent follow up visits to maximize her success with intensive lifestyle modifications for her multiple health conditions.   ATTESTASTION STATEMENTS:  Reviewed by clinician on day of visit: allergies, medications, problem list, medical history, surgical history, family history, social history, and previous encounter notes pertinent to obesity diagnosis.   I have personally spent 30 minutes total time today in preparation, patient care, nutritional counseling and education,  and documentation for this visit, including the following: review of most recent clinical lab tests, prescribing medications/ refilling medications, reviewing medical assistant documentation, review and interpretation of bioimpedence results.     Micky Albee, D.O. DABFM, DABOM [facility] [address] [place], [place] [address] [phone]

## 2023-10-05 ENCOUNTER — Encounter: Payer: Self-pay | Admitting: Neurology

## 2023-10-05 ENCOUNTER — Other Ambulatory Visit (HOSPITAL_BASED_OUTPATIENT_CLINIC_OR_DEPARTMENT_OTHER): Payer: Self-pay

## 2023-10-05 MED ORDER — ALPRAZOLAM 0.5 MG PO TABS
0.5000 mg | ORAL_TABLET | Freq: Every evening | ORAL | 0 refills | Status: DC | PRN
  Filled 2023-10-05: qty 2, 2d supply, fill #0

## 2023-10-05 NOTE — Addendum Note (Signed)
 Addended by: Neomia Banner on: 10/05/2023 11:29 AM   Modules accepted: Orders

## 2023-10-05 NOTE — Procedures (Signed)
 [facility]    HOME SLEEP TEST REPORT ( by Watch PAT)   STUDY DATE:  09-19-2023   ORDERING CLINICIAN: Neomia Banner, MD  REFERRING CLINICIAN: Micky Albee , DO   CLINICAL INFORMATION/HISTORY: Marissa Joseph is a 43 y.o. female patient who is seen here upon  referral by her physician at weight and wellness - on 08/30/2023  for an evaluation of apnea in the setting of poor sleep quality, witnessed apneas and snoring and obesity. BMI of 63.7 - started on phentermine  .  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 She was later admitted to hospital with pneumonia, asthma and cellulitis ( 2013 ) and there was put on BiPAP but she had no insurance coverage and was again discharged without BiPAP.     Sleep relevant medical history: Super obesity , asthma and excema-  gained weight after prednisone  treatment beginning in 9 th grade.Cellulitis, open wound left  leg , non healing  spider bite left leg, obesity hypoventilation, concussion in daycare at age 22 , Asthma and SOB , but actively vaping.      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 The patient currently works in shifts( night/ rotating). She wakes with headaches but is not woken by HA.  Naps are taken very frequently, lasting from 15 to 20 minutes- these are refreshing.       Epworth sleepiness score: 12/ 24 points on Phentermine  medication.  FSS endorsed at 54/ 63 points.      BMI: 64 kg/m   Neck Circumference:  16.5"    FINDINGS:   Sleep Summary:   Total Recording Time (hours, min): 7 hours 1 minute      Total Sleep Time (hours, min): 5 hours 49 minutes               Percent REM (%):   34%     Sleep latency was measured at 19 minutes and REM sleep latency at only 37 minutes.  There were 53 minutes of wakefulness after sleep onset.                                 Respiratory Indices:   Calculated pAHI (per AASM  guideline): 33.6/h  , all obstructive events.                      REM pAHI: 80.5/h                                               NREM pAHI:     27.1/h  Positional AHI:   The patient slept for the majority of the night in supine position for a total of 256 minutes associated with an AHI of 35.6/h.  Sleep in the right lateral position was associated with an AHI of 30.1/h.  Snoring reached a mean volume of 42 dB which is moderately loud and was present for almost 60% of total recorded sleep time. Snoring:                                                Oxygen Saturation Statistics:   Oxygen Saturation (%) Mean:   89%             O2 Saturation Range (%):   Between the nadir at 83% and a maximum of 97%                                    O2 Saturation (minutes) <89%:   18 minutes O2 saturation (minutes ) <90%:   40 minutes         Pulse Rate Statistics:   Pulse Mean (bpm): 69 bpm               Pulse Range: Between 43 and 99 bpm indicating intermittent bradycardia.    Caveat :Please note that this home sleep test device cannot provide cardiac rhythm data.                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  The diagnosis is not just sleep apnea but obesity hypoventilation as well.  Immediate treatment requires positive airway pressure therapy, and based on the patient's report BiPAP had been used into previous situations as  CPAP failed.    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 An order for a CPAP to BiPAP titration in lab has been issued.  The patient had received a prescription for Xanax  to help her accumulate enough sleep on the home sleep test and I will repeat this order for the in-lab sleep study as well.    INTERPRETING PHYSICIAN:   Neomia Banner, MD  [facility] and [facility] Sleep Board certified by The [employer] of Sleep Medicine and Diplomate of the [employer] of Sleep Medicine. Board certified In Neurology through the ABPN, Fellow of the [employer] of Neurology.

## 2023-10-06 DIAGNOSIS — E119 Type 2 diabetes mellitus without complications: Secondary | ICD-10-CM | POA: Diagnosis not present

## 2023-10-06 DIAGNOSIS — R32 Unspecified urinary incontinence: Secondary | ICD-10-CM | POA: Diagnosis not present

## 2023-10-06 DIAGNOSIS — F32A Depression, unspecified: Secondary | ICD-10-CM | POA: Diagnosis not present

## 2023-10-06 DIAGNOSIS — Z720 Tobacco use: Secondary | ICD-10-CM | POA: Diagnosis not present

## 2023-10-06 DIAGNOSIS — I10 Essential (primary) hypertension: Secondary | ICD-10-CM | POA: Diagnosis not present

## 2023-10-06 DIAGNOSIS — K581 Irritable bowel syndrome with constipation: Secondary | ICD-10-CM | POA: Diagnosis not present

## 2023-10-06 DIAGNOSIS — Z6841 Body Mass Index (BMI) 40.0 and over, adult: Secondary | ICD-10-CM | POA: Diagnosis not present

## 2023-10-06 DIAGNOSIS — F419 Anxiety disorder, unspecified: Secondary | ICD-10-CM | POA: Diagnosis not present

## 2023-10-06 DIAGNOSIS — J4481 Bronchiolitis obliterans and bronchiolitis obliterans syndrome: Secondary | ICD-10-CM | POA: Diagnosis not present

## 2023-10-06 DIAGNOSIS — G4733 Obstructive sleep apnea (adult) (pediatric): Secondary | ICD-10-CM | POA: Diagnosis not present

## 2023-10-06 DIAGNOSIS — M1990 Unspecified osteoarthritis, unspecified site: Secondary | ICD-10-CM | POA: Diagnosis not present

## 2023-10-08 NOTE — Telephone Encounter (Signed)
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   The diagnosis is not just sleep apnea but obesity hypoventilation as well.  The patient will need to return for in lab titration, based on her history of BIPAP need in the past, and poor response to CPAP in previous titration.

## 2023-10-08 NOTE — Telephone Encounter (Signed)
Called patient to discuss sleep study results. No answer at this time. LVM for the patient to call back.   

## 2023-10-10 ENCOUNTER — Telehealth: Payer: Self-pay | Admitting: Neurology

## 2023-10-10 NOTE — Telephone Encounter (Signed)
 CPAP [employer] pending

## 2023-10-11 ENCOUNTER — Other Ambulatory Visit (HOSPITAL_BASED_OUTPATIENT_CLINIC_OR_DEPARTMENT_OTHER): Payer: Self-pay

## 2023-10-11 ENCOUNTER — Encounter: Payer: Self-pay | Admitting: Internal Medicine

## 2023-10-11 ENCOUNTER — Ambulatory Visit: Admitting: Internal Medicine

## 2023-10-11 VITALS — BP 124/78 | HR 88 | Temp 99.7°F | Ht 64.0 in | Wt 362.0 lb

## 2023-10-11 DIAGNOSIS — E559 Vitamin D deficiency, unspecified: Secondary | ICD-10-CM

## 2023-10-11 DIAGNOSIS — R7303 Prediabetes: Secondary | ICD-10-CM | POA: Diagnosis not present

## 2023-10-11 DIAGNOSIS — L03116 Cellulitis of left lower limb: Secondary | ICD-10-CM | POA: Diagnosis not present

## 2023-10-11 MED ORDER — SULFAMETHOXAZOLE-TRIMETHOPRIM 800-160 MG PO TABS
1.0000 | ORAL_TABLET | Freq: Two times a day (BID) | ORAL | 0 refills | Status: DC
  Filled 2023-10-11: qty 20, 10d supply, fill #0

## 2023-10-11 MED ORDER — MUPIROCIN 2 % EX OINT
1.0000 | TOPICAL_OINTMENT | Freq: Two times a day (BID) | CUTANEOUS | 0 refills | Status: AC
  Filled 2023-10-11: qty 22, 11d supply, fill #0

## 2023-10-11 MED ORDER — FLUCONAZOLE 150 MG PO TABS
ORAL_TABLET | ORAL | 1 refills | Status: DC
  Filled 2023-10-11: qty 2, 3d supply, fill #0

## 2023-10-11 NOTE — Progress Notes (Signed)
 Patient ID: Marissa Joseph, female   DOB: 1981-02-20, 43 y.o.   MRN: [id number]        Chief Complaint: follow up left leg cellulitis, hx of MRSA, vaginitis, preDM, low vit d       HPI:  Marissa Joseph is a 43 y.o. female here with hx of MRSA cellutliis, now with 2 days onset left post calf cellulitis with a tender left groin LN noted this AM; no fever, chills, Pt denies chest pain, increased sob or doe, wheezing, orthopnea, PND, increased LE swelling, palpitations, dizziness or syncope.   Pt denies polydipsia, polyuria, or new focal neuro s/s.    Pt denies fever, wt loss, night sweats, loss of appetite, or other constitutional symptoms         Wt Readings from Last 3 Encounters:  10/11/23 (!) 362 lb (164.2 kg)  10/02/23 (!) 365 lb (165.6 kg)  09/14/23 (!) 371 lb (168.3 kg)   BP Readings from Last 3 Encounters:  10/11/23 124/78  10/02/23 135/87  09/14/23 128/84         Past Medical History:  Diagnosis Date   ADD (attention deficit disorder)    Anxiety    no official dx   Asthma    Back pain    Depression    Eczema    Edema    Fibroids    GONORRHEA 03/25/2008   Qualifier: Diagnosis of  By: Ladene Pickle     Heart murmur    Heart murmur    Hypertension    Joint pain    MRSA (methicillin resistant staph aureus) culture positive 05/2012   cellulits/ left leg   Multiple food allergies    Obesity    Sleep apnea    Past Surgical History:  Procedure Laterality Date   NO PAST SURGERIES     TEE WITHOUT CARDIOVERSION N/A 12/22/2019   Procedure: TRANSESOPHAGEAL ECHOCARDIOGRAM (TEE);  Surgeon: Wendie Hamburg, MD;  Location: [facility] ENDOSCOPY;  Service: Cardiovascular;  Laterality: N/A;    reports that she has quit smoking. Her smoking use included cigarettes. She has a 1 pack-year smoking history. She has quit using smokeless tobacco. She reports that she does not currently use alcohol. She reports current drug use. Drug: Marijuana. family history includes Breast  cancer in her maternal uncle and paternal aunt; Cancer in her maternal uncle and paternal aunt; Diabetes in her father, maternal grandmother, and mother; Heart disease in her maternal grandmother; Stroke in her maternal grandfather. Allergies  Allergen Reactions   Avocado Other (See Comments)    Throat itching and swelling   Shellfish Allergy Hives, Shortness Of Breath and Swelling   Current Outpatient Medications on File Prior to Visit  Medication Sig Dispense Refill   albuterol  (VENTOLIN  HFA) 108 (90 Base) MCG/ACT inhaler Inhale 1-2 puffs into the lungs every 6 (six) hours as needed for wheezing 6.7 g 5   ALPRAZolam  (XANAX ) 0.5 MG tablet Take 1 tablet (0.5 mg total) by mouth at bedtime as needed for anxiety or sleep (for sleep test use). 2 tablet 0   budesonide -formoterol  (SYMBICORT ) 80-4.5 MCG/ACT inhaler Inhale 2 puffs into the lungs 2 (two) times daily. 10.2 g 3   buPROPion  (WELLBUTRIN  XL) 150 MG 24 hr tablet Take 1 tablet (150 mg total) by mouth daily. 30 tablet 5   diclofenac  Sodium (VOLTAREN ) 1 % GEL Apply 4 g topically 4 (four) times daily as needed. 150 g 0   docusate sodium  (COLACE) 100 MG capsule Take 1 capsule (100  mg total) by mouth 2 (two) times daily. 60 capsule 0   EPINEPHrine  0.3 mg/0.3 mL IJ SOAJ injection Inject 0.3 mg into the muscle as needed for anaphylaxis. 2 each 0   furosemide  (LASIX ) 20 MG tablet Take 1 tablet (20 mg total) by mouth daily. 90 tablet 1   gabapentin  (NEURONTIN ) 100 MG capsule Take 2 capsules (200 mg total) by mouth at bedtime. 60 capsule 5   indomethacin  (INDOCIN ) 50 MG capsule TAKE 1 CAPSULE BY MOUTH THREE TIMES DAILY WITH MEALS     lidocaine  (LIDODERM ) 5 % Place 1 patch onto the skin daily. Remove & Discard patch within 12 hours or as directed by MD 30 patch 0   montelukast  (SINGULAIR ) 10 MG tablet Take 1 tablet (10 mg total) by mouth at bedtime. 90 tablet 1   nebivolol  (BYSTOLIC ) 2.5 MG tablet Take 1 tablet (2.5 mg total) by mouth daily. 30 tablet 5    olmesartan -hydrochlorothiazide  (BENICAR  HCT) 20-12.5 MG tablet Take 1 tablet by mouth daily. 90 tablet 1   Phentermine  HCl (LOMAIRA ) 8 MG TABS Take 1 tablet (8 mg total) by mouth 2 (two) times daily. Take 30 minutes before first meal of the day and 1 hour after 2nd meal of the day. 60 tablet 0   polyethylene glycol powder (GLYCOLAX /MIRALAX ) 17 GM/SCOOP powder Take 17 g by mouth 2 (two) times daily as needed for severe constipation. 238 g 1   triamcinolone  cream (KENALOG ) 0.1 % Apply 1 Application topically 2 (two) times daily. 30 g 5   No current facility-administered medications on file prior to visit.        ROS:  All others reviewed and negative.  Objective        PE:  BP 124/78 (BP Location: Right Arm, Patient Position: Sitting, Cuff Size: Normal)   Pulse 88   Temp 99.7 F (37.6 C) (Oral)   Ht 5\' 4"  (1.626 m)   Wt (!) 362 lb (164.2 kg)   LMP 08/20/2023 (Exact Date)   SpO2 96%   BMI 62.14 kg/m                 Constitutional: Pt appears in NAD               HENT: Head: NCAT.                Right Ear: External ear normal.                 Left Ear: External ear normal.                Eyes: . Pupils are equal, round, and reactive to light. Conjunctivae and EOM are normal               Nose: without d/c or deformity               Neck: Neck supple. Gross normal ROM               Cardiovascular: Normal rate and regular rhythm.                 Pulmonary/Chest: Effort normal and breath sounds without rales or wheezing.                Abd:  Soft, NT, ND, + BS, no organomegaly               Neurological: Pt is alert. At baseline orientation, motor grossly intact  Skin: Skin is warm.  LE edema - none, left post calf with 4 cm area post red, tender, heat without red streaks               Psychiatric: Pt behavior is normal without agitation   Micro: none  Cardiac tracings I have personally interpreted today:  none  Pertinent Radiological findings (summarize): none    Lab Results  Component Value Date   WBC 6.2 01/12/2023   HGB 12.3 01/12/2023   HCT 38.0 01/12/2023   PLT 248.0 01/12/2023   GLUCOSE 84 01/12/2023   CHOL 127 01/12/2023   TRIG 94.0 01/12/2023   HDL 41.30 01/12/2023   LDLCALC 67 01/12/2023   ALT 14 01/12/2023   AST 17 01/12/2023   NA 137 01/12/2023   K 4.1 01/12/2023   CL 102 01/12/2023   CREATININE 0.91 01/12/2023   BUN 16 01/12/2023   CO2 31 01/12/2023   TSH 1.040 10/17/2022   HGBA1C 5.4 01/12/2023   Assessment/Plan:  DENEQUA WADFORD is a 43 y.o. Black or African American [2] female with  has a past medical history of ADD (attention deficit disorder), Anxiety, Asthma, Back pain, Depression, Eczema, Edema, Fibroids, GONORRHEA (03/25/2008), Heart murmur, Heart murmur, Hypertension, Joint pain, MRSA (methicillin resistant staph aureus) culture positive (05/2012), Multiple food allergies, Obesity, and Sleep apnea.  Cellulitis of left lower extremity Recurrent with hx of MRSA - now for septra  ds bid course, also Bactroban  nasal ointment bid x 5 days, also diflucan  150 mg prn yeast with meds  Prediabetes Lab Results  Component Value Date   HGBA1C 5.4 01/12/2023   Stable, pt to continue current medical treatment  - diet, wt control   Vitamin D  deficiency Last vitamin D  Lab Results  Component Value Date   VD25OH 58.2 06/14/2023   Stable, cont oral replacement  Followup: Return if symptoms worsen or fail to improve.  Rosalia Colonel, MD 10/11/2023 9:06 PM [facility]

## 2023-10-11 NOTE — Assessment & Plan Note (Signed)
 Recurrent with hx of MRSA - now for septra  ds bid course, also Bactroban  nasal ointment bid x 5 days, also diflucan  150 mg prn yeast with meds

## 2023-10-11 NOTE — Patient Instructions (Signed)
 Please take all new medication as prescribed - the bactrim , the nasal antibiotic ointment, and the diflucan   Please continue all other medications as before, and refills have been done if requested.  Please have the pharmacy call with any other refills you may need.  Please keep your appointments with your specialists as you may have planned

## 2023-10-11 NOTE — Assessment & Plan Note (Signed)
 Lab Results  Component Value Date   HGBA1C 5.4 01/12/2023   Stable, pt to continue current medical treatment  - diet, wt control

## 2023-10-11 NOTE — Assessment & Plan Note (Signed)
 Last vitamin D  Lab Results  Component Value Date   VD25OH 58.2 06/14/2023   Stable, cont oral replacement

## 2023-10-11 NOTE — Telephone Encounter (Signed)
 CPAP [place] [employer]: [id number] (exp. 10/10/23 to 04/07/24)

## 2023-10-14 ENCOUNTER — Encounter: Payer: Self-pay | Admitting: Family Medicine

## 2023-10-14 DIAGNOSIS — E662 Morbid (severe) obesity with alveolar hypoventilation: Secondary | ICD-10-CM | POA: Insufficient documentation

## 2023-10-26 ENCOUNTER — Other Ambulatory Visit: Payer: Self-pay | Admitting: Internal Medicine

## 2023-10-26 ENCOUNTER — Other Ambulatory Visit: Payer: Self-pay

## 2023-10-26 ENCOUNTER — Other Ambulatory Visit (HOSPITAL_BASED_OUTPATIENT_CLINIC_OR_DEPARTMENT_OTHER): Payer: Self-pay

## 2023-10-26 MED ORDER — ALBUTEROL SULFATE HFA 108 (90 BASE) MCG/ACT IN AERS
1.0000 | INHALATION_SPRAY | Freq: Four times a day (QID) | RESPIRATORY_TRACT | 5 refills | Status: AC | PRN
Start: 2023-10-26 — End: ?
  Filled 2023-10-26: qty 6.7, 25d supply, fill #0
  Filled 2024-01-23: qty 6.7, 25d supply, fill #1
  Filled 2024-03-23: qty 6.7, 25d supply, fill #2
  Filled 2024-04-23: qty 6.7, 25d supply, fill #3
  Filled 2024-05-21: qty 6.7, 25d supply, fill #4

## 2023-11-01 ENCOUNTER — Other Ambulatory Visit: Payer: Self-pay

## 2023-11-01 ENCOUNTER — Ambulatory Visit: Admitting: Family Medicine

## 2023-11-01 ENCOUNTER — Other Ambulatory Visit (HOSPITAL_BASED_OUTPATIENT_CLINIC_OR_DEPARTMENT_OTHER): Payer: Self-pay

## 2023-11-01 ENCOUNTER — Encounter: Payer: Self-pay | Admitting: Family Medicine

## 2023-11-01 VITALS — BP 117/82 | HR 80 | Temp 98.0°F | Ht 64.0 in | Wt 361.0 lb

## 2023-11-01 DIAGNOSIS — E66813 Obesity, class 3: Secondary | ICD-10-CM

## 2023-11-01 DIAGNOSIS — R632 Polyphagia: Secondary | ICD-10-CM

## 2023-11-01 DIAGNOSIS — G4733 Obstructive sleep apnea (adult) (pediatric): Secondary | ICD-10-CM | POA: Diagnosis not present

## 2023-11-01 DIAGNOSIS — Z6841 Body Mass Index (BMI) 40.0 and over, adult: Secondary | ICD-10-CM | POA: Diagnosis not present

## 2023-11-01 DIAGNOSIS — Z9189 Other specified personal risk factors, not elsewhere classified: Secondary | ICD-10-CM

## 2023-11-01 DIAGNOSIS — Z7289 Other problems related to lifestyle: Secondary | ICD-10-CM

## 2023-11-01 MED ORDER — LOMAIRA 8 MG PO TABS: 8.0000 mg | ORAL_TABLET | Freq: Two times a day (BID) | ORAL | 0 refills | Status: DC

## 2023-11-01 MED ORDER — PHENTERMINE HCL 8 MG PO TABS
8.0000 mg | ORAL_TABLET | Freq: Two times a day (BID) | ORAL | 0 refills | Status: DC
  Filled 2023-11-01: qty 60, 30d supply, fill #0

## 2023-11-01 NOTE — Progress Notes (Signed)
 Office: [phone]  /  Fax: [phone]  WEIGHT SUMMARY AND BIOMETRICS  Starting Date: 03/08/23  Starting Weight: 369lb   Weight Lost Since Last Visit: 4lb   Vitals Temp: 98 F (36.7 C) BP: 117/82 Pulse Rate: 80 SpO2: 96 %   Body Composition  Body Fat %: 60.9 % Fat Mass (lbs): 219.8 lbs Muscle Mass (lbs): 134 lbs Visceral Fat Rating : 26   HPI  Chief Complaint: OBESITY  Marissa Joseph is here to discuss her progress with her obesity treatment plan. She is on the the Category 3 Plan and states she is following her eating plan approximately 80 % of the time. She states she is exercising 30 minutes 2 times per week.  Interval History:  Since last office visit she is down 4 lb She is up 4 lb of muscle and down 9 lb of body fat  This gives her a net weight loss of 9 lb in 7 mos She had a sinus infection last week She feels improvements in appetite control from Lomiara 8 mg bid She was more prone to skipping meals while sick She struggles to control her quantity of rice   Pharmacotherapy: Lomaira  8 mg bid  PHYSICAL EXAM:  Blood pressure 117/82, pulse 80, temperature 98 F (36.7 C), height 5\' 4"  (1.626 m), weight (!) 361 lb (163.7 kg), last menstrual period 08/20/2023, SpO2 96%. Body mass index is 61.97 kg/m.  General: She is overweight, cooperative, alert, well developed, and in no acute distress. PSYCH: Has normal mood, affect and thought process.   Lungs: Normal breathing effort, no conversational dyspnea.   ASSESSMENT AND PLAN  TREATMENT PLAN FOR OBESITY:  Recommended Dietary Goals  Braileigh is currently in the action stage of change. As such, her goal is to continue weight management plan. She has agreed to the Category 3 Plan.  Behavioral Intervention  We discussed the following Behavioral Modification Strategies today: increasing lean protein intake to established goals, increasing fiber rich foods, increasing water intake , work on meal planning and  preparation, keeping healthy foods at home, work on managing stress, creating time for self-care and relaxation, avoiding temptations and identifying enticing environmental cues, planning for success, and continue to work on maintaining a reduced calorie state, getting the recommended amount of protein, incorporating whole foods, making healthy choices, staying well hydrated and practicing mindfulness when eating..  Additional resources provided today: NA  Recommended Physical Activity Goals  [name] has been advised to work up to 150 minutes of moderate intensity aerobic activity a week and strengthening exercises 2-3 times per week for cardiovascular health, weight loss maintenance and preservation of muscle mass.   She has agreed to Start aerobic activity with a goal of 150 minutes a week at moderate intensity.   Pharmacotherapy changes for the treatment of obesity: none  ASSOCIATED CONDITIONS ADDRESSED TODAY  Sedentary lifestyle Unchanged R ankle pain has limited her walking time but she has plans to go to [facility] [employer] due to over - pronation.  Think about options to increase physical activity this summer.  She does have access to [facility] pool.  Class 3 severe obesity due to excess calories with serious comorbidity and body mass index (BMI) of 60.0 to 69.9 in adult -     Lomaira ; Take 1 tablet (8 mg total) by mouth 2 (two) times daily. Take 30 minutes before first meal of the day and 1 hour after 2nd meal of the day.  Dispense: 60 tablet; Refill: 0 BP/  HR are at goal without adverse SE Appetite and cravings have improved and she is starting to see weight loss We did discuss her overall success in the past 7 mos and her weight over lifetime. With her BMI of 61 and lifetime struggle with obesity, we discussed the role of bariatric surgery. She will look at options of RYGB and VSG.  OSA (obstructive sleep apnea) She has a hx of OSA on BiPap and is awaiting further testing with  Dr Albertina Hugger Continue active plan for weight reduction and improving sleep quality  Polyphagia Improved some on Lomaira  8 mg bid + a higher protein/ higher fiber/ lower sugar diet She lacks insurance coverage for Zepbound  or Wegovy We discussed the long term effects from bariatric surgery to aid in excessive appetite    She was informed of the importance of frequent follow up visits to maximize her success with intensive lifestyle modifications for her multiple health conditions.   ATTESTASTION STATEMENTS:  Reviewed by clinician on day of visit: allergies, medications, problem list, medical history, surgical history, family history, social history, and previous encounter notes pertinent to obesity diagnosis.   I have personally spent 33 minutes total time today in preparation, patient care, nutritional counseling and education,  and documentation for this visit, including the following: review of most recent clinical lab tests, prescribing medications/ refilling medications, reviewing medical assistant documentation, review and interpretation of bioimpedence results.     Micky Albee, D.O. DABFM, DABOM [facility] [address] [place], [place] [address] [phone]

## 2023-11-01 NOTE — Patient Instructions (Signed)
 Work on the following:   Water intake: 100 oz/ day Self Care- positive self talk/ get to bed on time Get started with CPAP at night Workout time: 30 min 3 days/ wk  1600 cal/ day goal 130 g of protein goal

## 2023-11-13 ENCOUNTER — Other Ambulatory Visit (HOSPITAL_BASED_OUTPATIENT_CLINIC_OR_DEPARTMENT_OTHER): Payer: Self-pay

## 2023-11-14 ENCOUNTER — Other Ambulatory Visit (HOSPITAL_COMMUNITY)
Admission: RE | Admit: 2023-11-14 | Discharge: 2023-11-14 | Disposition: A | Discharge status: Home / Self Care | Source: Ambulatory Visit | Attending: Obstetrics and Gynecology | Admitting: Obstetrics and Gynecology

## 2023-11-14 ENCOUNTER — Other Ambulatory Visit: Payer: Self-pay

## 2023-11-14 ENCOUNTER — Encounter: Payer: Self-pay | Admitting: Obstetrics and Gynecology

## 2023-11-14 ENCOUNTER — Ambulatory Visit: Admitting: Obstetrics and Gynecology

## 2023-11-14 ENCOUNTER — Encounter: Payer: Self-pay | Admitting: Family Medicine

## 2023-11-14 VITALS — BP 119/83 | HR 67 | Wt 370.2 lb

## 2023-11-14 DIAGNOSIS — Z01419 Encounter for gynecological examination (general) (routine) without abnormal findings: Secondary | ICD-10-CM

## 2023-11-14 DIAGNOSIS — B9689 Other specified bacterial agents as the cause of diseases classified elsewhere: Secondary | ICD-10-CM | POA: Insufficient documentation

## 2023-11-14 DIAGNOSIS — Z113 Encounter for screening for infections with a predominantly sexual mode of transmission: Secondary | ICD-10-CM | POA: Insufficient documentation

## 2023-11-14 DIAGNOSIS — Z3202 Encounter for pregnancy test, result negative: Secondary | ICD-10-CM | POA: Diagnosis not present

## 2023-11-14 DIAGNOSIS — Z1231 Encounter for screening mammogram for malignant neoplasm of breast: Secondary | ICD-10-CM

## 2023-11-14 DIAGNOSIS — N760 Acute vaginitis: Secondary | ICD-10-CM | POA: Diagnosis not present

## 2023-11-14 DIAGNOSIS — N914 Secondary oligomenorrhea: Secondary | ICD-10-CM

## 2023-11-14 DIAGNOSIS — M5020 Other cervical disc displacement, unspecified cervical region: Secondary | ICD-10-CM | POA: Insufficient documentation

## 2023-11-14 LAB — POCT PREGNANCY, URINE: Preg Test, Ur: NEGATIVE

## 2023-11-14 NOTE — Progress Notes (Signed)
 GYNECOLOGY VISIT  Patient name: Marissa Joseph MRN [id number]  Date of birth: 03/12/1981 Chief Complaint:   Gynecologic Exam  History:  Marissa Joseph is a 43 y.o. G0P0000 being seen today for irregular bleeding. 87 days without a period and having hot flashes, mood swings and feeling irritable. Will have symptoms like an upcomign period but no bleeding, includign breast tenderness. Feels soemthing moving around in her abdomen and concerned.    Discussed the use of AI scribe software for clinical note transcription with the patient, who gave verbal consent to proceed.  History of Present Illness Marissa Joseph is a 43 year old female who presents with amenorrhea since March.  She has experienced amenorrhea since March, following a history of irregular menstrual cycles. Her cycles were previously irregular, with some periods lasting up to two months without menstruation. Recently, her periods have been less painful, although she still experiences significant cramps, particularly on the first day, which sometimes radiate to her back. Her periods have not been heavy recently, unlike in the past when the pain was 'unbearable' and affected her ability to work.  She describes experiencing hot flashes and emotional lability, which she associates with perimenopausal symptoms. No vaginal dryness is reported. She is sexually active but not using contraception and is open to the possibility of pregnancy. Previous lab work indicated a low ovarian reserve.  She reports intermittent breast discomfort, described as 'random shooting pain,' which she noticed around the time she was due for a mammogram. She is currently taking Lomaira  for weight loss and experienced illness a few weeks ago, which she attributes to a possible interaction with her medication.    Past Medical History:  Diagnosis Date   ADD (attention deficit disorder)    Anxiety    no official dx   Asthma    Back pain     Depression    Eczema    Edema    Fibroids    GONORRHEA 03/25/2008   Qualifier: Diagnosis of  By: Ladene Pickle     Heart murmur    Heart murmur    Hypertension    Joint pain    MRSA (methicillin resistant staph aureus) culture positive 05/2012   cellulits/ left leg   Multiple food allergies    Obesity    Sleep apnea     Past Surgical History:  Procedure Laterality Date   NO PAST SURGERIES     TEE WITHOUT CARDIOVERSION N/A 12/22/2019   Procedure: TRANSESOPHAGEAL ECHOCARDIOGRAM (TEE);  Surgeon: Wendie Hamburg, MD;  Location: [facility] ENDOSCOPY;  Service: Cardiovascular;  Laterality: N/A;    The following portions of the patient's history were reviewed and updated as appropriate: allergies, current medications, past family history, past medical history, past social history, past surgical history and problem list.   Health Maintenance:   Last pap     Component Value Date/Time   DIAGPAP  08/17/2022 0918    - Negative for Intraepithelial Lesions or Malignancy (NILM)   DIAGPAP - Benign reactive/reparative changes 08/17/2022 0918   DIAGPAP  08/07/2018 0000    NEGATIVE FOR INTRAEPITHELIAL LESIONS OR MALIGNANCY.   DIAGPAP TRICHOMONAS VAGINALIS PRESENT. 08/07/2018 0000   DIAGPAP SHIFT IN FLORA SUGGESTIVE OF BACTERIAL VAGINOSIS. 08/07/2018 0000   HPVHIGH Negative 08/17/2022 0918   ADEQPAP  08/17/2022 0918    Satisfactory for evaluation; transformation zone component ABSENT.   ADEQPAP  08/07/2018 0000    Satisfactory for evaluation  endocervical/transformation zone component PRESENT.    Last  mammogram: 08/2022 BIRADS 1   Review of Systems:  Pertinent items are noted in HPI. Comprehensive review of systems was otherwise negative.   Objective:  Physical Exam BP 119/83   Pulse 67   Wt (!) 370 lb 3.2 oz (167.9 kg)   BMI 63.54 kg/m    Physical Exam Vitals and nursing note reviewed.  Constitutional:      Appearance: Normal appearance.  HENT:     Head: Normocephalic  and atraumatic.  Pulmonary:     Effort: Pulmonary effort is normal.   Skin:    General: Skin is warm and dry.   Neurological:     General: No focal deficit present.     Mental Status: She is alert.   Psychiatric:        Mood and Affect: Mood normal.        Behavior: Behavior normal.        Thought Content: Thought content normal.        Judgment: Judgment normal.         Assessment & Plan:   Assessment & Plan Amenorrhea Amenorrhea since March with irregular cycles. Low AMH indicates decreased ovarian reserve, suggesting perimenopause. Differential includes PCOS, thyroid  dysfunction, diabetes, or uterine scarring. - Order thyroid  function tests and FSH. - If labs normal, initiate Provera challenge with progesterone for 10 days. - If Provera challenge fails, order pelvic ultrasound to assess uterine lining and rule out fibroids.  Breast pain Intermittent shooting breast pain possibly linked to Lomaira . No recent episodes post-discontinuation. - Schedule mammogram for routine screening.   Routine preventative health maintenance measures emphasized.  Kiki Pelton, MD [facility], [facility]

## 2023-11-15 ENCOUNTER — Ambulatory Visit: Payer: Self-pay | Admitting: Obstetrics and Gynecology

## 2023-11-15 DIAGNOSIS — B9689 Other specified bacterial agents as the cause of diseases classified elsewhere: Secondary | ICD-10-CM

## 2023-11-15 LAB — RPR+HBSAG+HCVAB+...
HIV Screen 4th Generation wRfx: NONREACTIVE
Hep C Virus Ab: NONREACTIVE
Hepatitis B Surface Ag: NEGATIVE
RPR Ser Ql: NONREACTIVE

## 2023-11-15 LAB — PROLACTIN: Prolactin: 8 ng/mL (ref 4.8–33.4)

## 2023-11-15 LAB — TSH RFX ON ABNORMAL TO FREE T4: TSH: 1.01 u[IU]/mL (ref 0.450–4.500)

## 2023-11-15 LAB — FOLLICLE STIMULATING HORMONE: FSH: 23.3 m[IU]/mL

## 2023-11-16 ENCOUNTER — Other Ambulatory Visit (HOSPITAL_BASED_OUTPATIENT_CLINIC_OR_DEPARTMENT_OTHER): Payer: Self-pay

## 2023-11-16 MED ORDER — METRONIDAZOLE 500 MG PO TABS
500.0000 mg | ORAL_TABLET | Freq: Two times a day (BID) | ORAL | 0 refills | Status: AC
  Filled 2023-11-16: qty 14, 7d supply, fill #0

## 2023-11-16 MED ORDER — MEDROXYPROGESTERONE ACETATE 10 MG PO TABS
10.0000 mg | ORAL_TABLET | Freq: Every day | ORAL | 0 refills | Status: AC
  Filled 2023-11-16: qty 10, 10d supply, fill #0

## 2023-11-20 ENCOUNTER — Ambulatory Visit (INDEPENDENT_AMBULATORY_CARE_PROVIDER_SITE_OTHER): Admitting: Neurology

## 2023-11-20 DIAGNOSIS — F17290 Nicotine dependence, other tobacco product, uncomplicated: Secondary | ICD-10-CM

## 2023-11-20 DIAGNOSIS — G4726 Circadian rhythm sleep disorder, shift work type: Secondary | ICD-10-CM

## 2023-11-20 DIAGNOSIS — J301 Allergic rhinitis due to pollen: Secondary | ICD-10-CM

## 2023-11-20 DIAGNOSIS — Z6841 Body Mass Index (BMI) 40.0 and over, adult: Secondary | ICD-10-CM

## 2023-11-20 DIAGNOSIS — J4521 Mild intermittent asthma with (acute) exacerbation: Secondary | ICD-10-CM

## 2023-11-20 DIAGNOSIS — G4733 Obstructive sleep apnea (adult) (pediatric): Secondary | ICD-10-CM

## 2023-11-20 DIAGNOSIS — I872 Venous insufficiency (chronic) (peripheral): Secondary | ICD-10-CM

## 2023-11-20 DIAGNOSIS — E66813 Obesity, class 3: Secondary | ICD-10-CM

## 2023-11-21 ENCOUNTER — Ambulatory Visit

## 2023-11-22 ENCOUNTER — Other Ambulatory Visit: Payer: Self-pay | Admitting: Family Medicine

## 2023-11-22 DIAGNOSIS — K5900 Constipation, unspecified: Secondary | ICD-10-CM

## 2023-11-26 ENCOUNTER — Other Ambulatory Visit (HOSPITAL_BASED_OUTPATIENT_CLINIC_OR_DEPARTMENT_OTHER): Payer: Self-pay

## 2023-11-27 ENCOUNTER — Other Ambulatory Visit (HOSPITAL_BASED_OUTPATIENT_CLINIC_OR_DEPARTMENT_OTHER): Payer: Self-pay

## 2023-11-28 ENCOUNTER — Encounter (HOSPITAL_BASED_OUTPATIENT_CLINIC_OR_DEPARTMENT_OTHER): Payer: Self-pay

## 2023-11-28 ENCOUNTER — Other Ambulatory Visit (HOSPITAL_BASED_OUTPATIENT_CLINIC_OR_DEPARTMENT_OTHER): Payer: Self-pay

## 2023-11-29 ENCOUNTER — Ambulatory Visit: Admitting: Family Medicine

## 2023-12-10 ENCOUNTER — Ambulatory Visit (INDEPENDENT_AMBULATORY_CARE_PROVIDER_SITE_OTHER): Admitting: Bariatrics

## 2023-12-10 ENCOUNTER — Encounter: Payer: Self-pay | Admitting: Bariatrics

## 2023-12-10 ENCOUNTER — Other Ambulatory Visit: Payer: Self-pay

## 2023-12-10 ENCOUNTER — Other Ambulatory Visit (HOSPITAL_BASED_OUTPATIENT_CLINIC_OR_DEPARTMENT_OTHER): Payer: Self-pay

## 2023-12-10 VITALS — BP 132/84 | HR 62 | Temp 98.1°F | Ht 64.0 in | Wt 371.0 lb

## 2023-12-10 DIAGNOSIS — E66813 Obesity, class 3: Secondary | ICD-10-CM

## 2023-12-10 DIAGNOSIS — Z6841 Body Mass Index (BMI) 40.0 and over, adult: Secondary | ICD-10-CM

## 2023-12-10 DIAGNOSIS — F5089 Other specified eating disorder: Secondary | ICD-10-CM

## 2023-12-10 DIAGNOSIS — R632 Polyphagia: Secondary | ICD-10-CM

## 2023-12-10 DIAGNOSIS — I10 Essential (primary) hypertension: Secondary | ICD-10-CM | POA: Diagnosis not present

## 2023-12-10 MED ORDER — TOPIRAMATE 50 MG PO TABS
50.0000 mg | ORAL_TABLET | Freq: Two times a day (BID) | ORAL | 0 refills | Status: DC
  Filled 2023-12-10: qty 60, 30d supply, fill #0

## 2023-12-10 MED ORDER — LOMAIRA 8 MG PO TABS
8.0000 mg | ORAL_TABLET | Freq: Two times a day (BID) | ORAL | 0 refills | Status: DC
Start: 2023-12-10 — End: 2024-01-28
  Filled 2023-12-10 – 2024-01-08 (×2): qty 60, 30d supply, fill #0

## 2023-12-10 NOTE — Progress Notes (Signed)
 WEIGHT SUMMARY AND BIOMETRICS  Weight Lost Since Last Visit: 0  Weight Gained Since Last Visit: 10lb   Vitals Temp: 98.1 F (36.7 C) BP: 132/84 Pulse Rate: 62 SpO2: 95 %   Anthropometric Measurements Height: 5' 4 (1.626 m) Weight: (!) 371 lb (168.3 kg) BMI (Calculated): 63.65 Weight at Last Visit: 361lb Weight Lost Since Last Visit: 0 Weight Gained Since Last Visit: 10lb Starting Weight: 369lb Total Weight Loss (lbs): 0 lb (0 kg)   Body Composition  Body Fat %: 63.8 % Fat Mass (lbs): 236.8 lbs Muscle Mass (lbs): 127.6 lbs Visceral Fat Rating : 28   Other Clinical Data Fasting: yes Labs: no Today's Visit #: 10 Starting Date: 03/08/23    OBESITY Maleiyah is here to discuss her progress with her obesity treatment plan along with follow-up of her obesity related diagnoses.    Nutrition Plan: the Category 3 plan - 75% adherence.  Current exercise: none  Interim History:  She is up 10 lbs since her last visit. She believes that it is water weight, but not registering on the bio-impedence scale.  She is a patient of Dr. Darice Pean but I am seeing her today because Dr. Delores did not have a slot for her.  She is up 10 pounds since her last visit.  She is not short of breath and or is not having any chest pain.  She did states that something in her knees popped almost a week ago when she was getting in the car she states that she had the sensation in both knees and she has been less active since that time.  She denies falling or other trauma.  She states that she has seen both her PCP and orthopedist in the past for pain in her knees.  She states she has a history of meniscus issues.  She has not seen PT for knee pain.  She states that she has elevated her legs and has iced them and that her knees are feeling somewhat better.   She states that she has been  having food cravings especially midday and at night and has been eating more sweets which could contribute to her weight gain.  She also states that she did not take her Lomaira  for approximately 2 weeks secondary to being on an antibiotic and did not want to take these medications together.  She does take Wellbutrin  for other issues. Not eating all of the food on the plan., Protein intake is as prescribed, Is not skipping meals, Not journaling consistently., Water intake is adequate., Denies polyphagia, and Reports excessive cravings.   Pharmacotherapy: Makylah is on Lomaira  8 mg by mouth  See Rx.  Adverse side effects: None Hunger is well controlled.  Cravings are poorly controlled.  Assessment/Plan:   Neria Procter endorses excessive hunger.  Medication(s): Lomaira   Effects of medication (appetite):  moderately controlled.  Cravings are poorly controlled.   Plan: Medication(s): Lomaira  8 mg by mouth  ( see Rx ) Will increase water, protein and fiber to help assuage hunger.  Will minimize foods that have a high glucose index/load to minimize reactive hypoglycemia.   Hypertension Hypertension well controlled.  Medication(s): Benicar  HCT 20-12.5, Bystolic  2.5 mg and Lasix  20 mg.   BP Readings from Last 3 Encounters:  12/10/23 132/84  11/14/23 119/83  11/01/23 117/82   Lab Results  Component Value Date   CREATININE 0.91 01/12/2023   CREATININE 0.84 08/08/2022   CREATININE 0.83 07/14/2022   Lab Results  Component Value Date   GFR 78.24 01/12/2023   GFR 87.68 07/14/2022   GFR 80.17 06/09/2021    Plan: Continue all antihypertensives at current dosages. No added salt. Will keep sodium content to 1,500 mg or less per day.    Eating disorder/emotional eating Haedyn has had issues with stress eating, emotional eating, nighttime eating, and boredom eating. Currently this is poorly controlled. Overall mood is stable. Denies suicidal/homicidal ideation. Medication(s): taking  Wellbutrin  but not for cravings.   Plan:  Motivational interviewing as well as evidence-based interventions for health behavior change were utilized today including the discussion of self monitoring techniques, problem-solving barriers and SMART goal setting techniques.  Discussed distractions to curb eating behaviors. Discussed activities to do with one's hands in the evening  Be sure to get adequate rest as lack of rest can trigger appetite.  Have plan in place for stressful events.  Consider other rewards besides food.  Rx: Topamax  50 mg midday and around 12:00 midnight #60 with no refills.     Morbid Obesity: Current BMI BMI (Calculated): 63.65   Pharmacotherapy Plan Continue and refill  Lomaira  8 mg by mouth  See Rx  Taurus is currently in the action stage of change. As such, her goal is to continue with weight loss efforts.  She has agreed to the Category 3 plan.  Exercise goals: All adults should avoid inactivity. Some physical activity is better than none, and adults who participate in any amount of physical activity gain some health benefits.  She will talk with her PCP in regard to her knees and may need an another evaluation per her orthopedist.  She will also investigate if PT might be a good option for her due to her hypermobility issues.  Behavioral modification strategies: increasing lean protein intake, no meal skipping, meal planning , increase water intake, better snacking choices, planning for success, increasing vegetables, decreasing sodium intake, avoiding temptations, keep healthy foods in the home, increase frequency of journaling, work on smaller portions, and mindful eating.  Carlene has agreed to follow-up with our clinic in 4 weeks with Dr. Waylan.    Objective:   VITALS: Per patient if applicable, see vitals. GENERAL: Alert and in no acute distress. CARDIOPULMONARY: No increased WOB. Speaking in clear sentences.  PSYCH: Pleasant and cooperative. Speech  normal rate and rhythm. Affect is appropriate. Insight and judgement are appropriate. Attention is focused, linear, and appropriate.  NEURO: Oriented as arrived to appointment on time with no prompting.   Attestation Statements:   This was prepared with the assistance of Engineer, civil (consulting).  Occasional wrong-word or sound-a-like substitutions may have occurred due to the inherent limitations of voice recognition   Clayborne Daring, DO

## 2023-12-16 ENCOUNTER — Ambulatory Visit: Payer: Self-pay | Admitting: Neurology

## 2023-12-16 DIAGNOSIS — E662 Morbid (severe) obesity with alveolar hypoventilation: Secondary | ICD-10-CM

## 2023-12-16 DIAGNOSIS — G4733 Obstructive sleep apnea (adult) (pediatric): Secondary | ICD-10-CM

## 2023-12-16 NOTE — Procedures (Signed)
 [facility] PAP TITRATION INTERPRETATION REPORT   STUDY DATE: 11/20/2023      PATIENT NAME:  Marissa Joseph         DATE OF BIRTH:  03/23/1981  PATIENT ID:  [id number]    TYPE OF STUDY:  CPAP  READING PHYSICIAN: DEDRA GORES, MD SCORING TECHNICIAN: Jesusa Joseph, RPSGT   HISTORY:  Marissa Joseph This 43 year old female patient of Dr Marissa Hope, MD, presented  upon referral of Dr Marissa Joseph , DO, on  08-30-2023 with a documente  BMI just under of 64 and  reported showllow breathing ( obeisty hypoventilation) , asthma,  chronic sleep hypoxia, cellulitis,  active vaping, witnessed apneas and snoring , coughing and is struggling to sleep through the night . Apnea had been dx in the year 2008 and did not tolerate CPAP then , only BiPAP.  She has recovered from influenza A but reported near constant non productive cough.  The patient is also a shift worker and received a Xanax  prescription to allow improved sleep latency and -efficiency.  This titration follows her HST from 09-18-2023, which resulted in a Total  Calculated pAHI (per AASM  guideline): 33.6/h , all obstructive events.                     REM pAHI: 80.5/h     NREM pAHI:     27.1/h                       Sleep in the right lateral position was associated with an AHI of 30.1/h. O2 Saturation (minutes) <89%:   18 minutes . O2 saturation (minutes ) <90%:   40 minutes , and nadir at 83%.       The Epworth Sleepiness Scale was endorsed at 12 out of 24 (scores above or equal to 10 are suggestive of hypersomnolence). on Phentermine  medication.   FSS endorsed at 54/ 63 points.   DESCRIPTION: A registered  sleep technologist was in attendance for the duration of the recording.  Data collection, scoring, video monitoring, and reporting were performed in compliance with the AASM Manual for the Scoring of Sleep and Associated Events; A physician certified by the Board of Sleep Medicine reviewed each epoch of  the study.  ADDITIONAL INFORMATION:  Height: 64.0 in Weight: 370 lb (BMI 63) Neck Size: 16.5 in  MEDICATIONS: Albuterol , Xanax , Symbicort , Wellbutrin  XL, Voltaren  Gel, Colace, Epinephrine , Lasix , Neurontin , Indocin , Lidoderm , Singulair , Bystolic , Benicar , Phentermine , Glycolax /Miralax , Kenalog .  SLEEP CONTINUITY AND SLEEP ARCHITECTURE:  Lights off was at 21:03: and lights on 04:36: (7.5 hours in bed).  CPAP initated at 5 cm water and titrated to 11 cm water , 3 cm EPR and insuffiecient  impact of hypoxia. Total sleep time was 313.5 minutes (57.3% supine;  42.7% lateral;  0.0% prone), with a decreased sleep efficiency at 69.2%.  Sleep latency was normal at 19.0 minutes.   Of the total sleep time, the percentage of stage N1 sleep was 5.3%, stage N2 sleep was 76.6%, stage N3 sleep was 0.2%, and REM sleep was 18.0%.  There were 3 Stage R periods observed on this study night, 54 awakenings (i.e. transitions to Stage W from any sleep stage), and 135.0 total stage transitions.  Wake after sleep onset (WASO) time accounted for 119 minutes.  AROUSAL: There was an arousal index of 8.2 arousals/hour.  Of these, 12 were identified as respiratory-related arousals (2.3 /h), 0 were PLM-related  arousals (0.0 /h), and 66 were non-specific arousals (12.6 /h)  RESPIRATORY MONITORING:    Based on AASM criteria (using a 3% oxygen desaturation and /or arousal rule for scoring hypopneas), there were 0 apneas (0 obstructive; 0 central; 0 mixed), and 80 hypopneas.  Apnea index was 0.0. Hypopnea index was 15.3/h.  The apnea-hypopnea index was 15.3/h  overall (13.4/h in supine, 56.8/in  non-supine; and 56.3 REM/h -supine REM). There were 0 respiratory effort-related arousals (RERAs).  The RERA index was 0.0 events/h. BODY POSITION: Duration of total sleep and percent of total sleep in their respective position is as follows: supine 179 minutes (57.3%), non-supine 134.0 minutes (42.7%); right 59 minutes (18.8%), left 75  minutes (23.9%), and prone 00 minutes (0.0%).  Total supine REM sleep time was 28 minutes (49.6% of total REM sleep).  OXIMETRY: Total sleep time spent at, or below 88% was 73.7 minutes, or 23.5% of total sleep time. Snoring was classified as severe and coughing interfered with sleep duration . Respiratory events were associated with oxyhemoglobin desaturations (nadir during sleep 71%) from a mean of 90%).  EKG.: The average heart rate during sleep was 63 bpm.  The maximum heart rate during sleep was 86 bpm. The maximum heart rate during recording was 90.  LIMB MOVEMENTS: There were 0 periodic limb movements of sleep (0.0/h), of which 0 (0.0/h) were associated with an arousal.   IMPRESSION:  This patient presented with a sleep apnea pattern and associated hypoxia pattern typical for obesity hypoventilation, and improved under CPAP titration.  The treatment strated under 4 cm water pressure and increased to a final explored pressure of  11 cm water , 3 cm EPR and insufficient  impact of hypoxia .  The associated AHI  0.8 /h and based on 78 minutes of sleep time, the hypoxia time was  50 minutes ( out of 78 minutes !). The mask used was not noted in the technologist report.   1.  Hypoxemia was not resolved with therapy.  1 l of oxygen was added to the PAP airflow.  2. Total sleep time was reduced.  The quality of  sleep did not change with positive airway pressure.    RECOMMENDATIONS: The tolerance of CPAP was limited and the type of interface unfortunatly not mentioned- I recommend a trial fo CPAP- autotitration,  beginning at 6 cm water  with 2 cm EPR and advancing to 12 cm water with 2 cm EPR, a mask fitting and heated humidification, with 2 liter oxygen to be  expressed into the PAP device for the duration of the CPAP use.   RV with NP within 60-90 days on CPAP .  if the patient cannot tolerate CPAP interface , she is to contact the DME ( medical  equipment company and to get refitted for a  mask. If the patient struggles with compliance due to problems  to tolerate the pressure settings of the precribed device, she is to contact [facility] and arrange for a visit with a download  of data . A change to BiPAP can then be entertained.    Dedra Gores, MD            [facility] CPAP/Bilevel Report    General Information  Name: Terralyn, Matsumura BMI: 36 Physician: ,   ID: [id number] Height: 64 in Technician: Jackson Nest  Sex: Female Weight: 370 lb Record: [id number]  Age: 61 [[birth year]] Date: 11/20/2023 Scorer: Jesusa Joseph   Recommended Settings   Pressure  in Cm H20 IPAP/EPAP 00 04 04 05 06 07 08 09   O2 Vol 0.0 0.0 1.0 1.0 1.0 1.0 1.0 1.0  Time TRT 2.55m 53.72m 24.74m 62.58m 30.69m 18.41m 92.67m 14.58m   TST 0.24m 35.74m 23.56m 52.89m 27.34m 9.22m 60.25m 13.78m  Sleep Stage % Wake 0.0 2.7 4.2 16.8 11.5 47.2 34.8 7.1   % REM 0.0 0.0 0.0 7.7 90.7 0.0 8.3 73.1   % N1 0.0 1.4 0.0 1.9 1.9 5.3 8.3 3.8   % N2 0.0 97.2 100.0 90.4 7.4 94.7 83.3 23.1   % N3 0.0 1.4 0.0 0.0 0.0 0.0 0.0 0.0  Respiratory Total Events 0 0 7 13 23  0 15 11   Obs. Apn. 0 0 0 0 0 0 0 0   Mixed Apn. 0 0 0 0 0 0 0 0   Cen. Apn. 0 0 0 0 0 0 0 0   Hypopneas 0 0 7 13 23  0 15 11   AHI 0.00 0.00 18.26 15.00 51.11 0.00 15.00 50.77   Supine AHI 0.00 0.00 18.26 0.00 0.00 0.00 27.50 50.77   Prone AHI 0.00 0.00 0.00 0.00 0.00 0.00 0.00 0.00   Side AHI 0.00 0.00 0.00 16.25 51.11 0.00 6.67 0.00  Respiratory (4%) Hypopneas (4%) 0.00 0.00 5.00 11.00 19.00 0.00 15.00 10.00   AHI (4%) 0.00 0.00 13.04 12.69 42.22 0.00 15.00 46.15   Supine AHI (4%) 0.00 0.00 13.04 0.00 0.00 0.00 27.50 46.15   Prone AHI (4%) 0.00 0.00 0.00 0.00 0.00 0.00 0.00 0.00   Side AHI (4%) 0.00 0.00 0.00 13.75 42.22 0.00 6.67 0.00  Desat Profile <= 90% 0.77m 26.79m 11.46m 18.39m 20.45m 1.44m 24.84m 10.68m   <= 80% 0.29m 0.92m 0.74m 1.23m 3.61m 0.70m 9.57m 1.44m   <= 70% 0.34m 0.20m 0.16m 0.92m 0.26m 0.70m 8.48m 0.19m   <= 60%  0.77m 0.58m 0.59m 0.55m 0.41m 0.50m 8.38m 0.54m  Arousal Index Apnea 0.0 0.0 0.0 0.0 0.0 0.0 0.0 0.0   Hypopnea 0.0 0.0 2.6 2.3 11.1 0.0 2.0 0.0   LM 0.0 0.0 0.0 0.0 0.0 0.0 0.0 0.0   Spontaneous 0.0 3.4 13.0 12.7 8.9 0.0 20.0 9.2   Pressure IPAP/EPAP 10 11   O2 Vol 1.0 1.0  Time TRT 17.54m 139.28m   TST 17.29m 76.94m  Sleep Stage % Wake 2.9 42.5   % REM 79.4 0.0   % N1 0.0 11.1   % N2 20.6 88.9   % N3 0.0 0.0  Respiratory Total Events 10 1   Obs. Apn. 0 0   Mixed Apn. 0 0   Cen. Apn. 0 0   Hypopneas 10 1   AHI 35.29 0.78   Supine AHI 35.29 0.95   Prone AHI 0.00 0.00   Side AHI 0.00 0.00  Respiratory (4%) Hypopneas (4%) 7.00 1.00   AHI (4%) 24.71 0.78   Supine AHI (4%) 24.71 0.95   Prone AHI (4%) 0.00 0.00   Side AHI (4%) 0.00 0.00  Desat Profile <= 90% 13.54m 50.44m   <= 80% 0.4m 0.62m   <= 70% 0.92m 0.3m   <= 60% 0.43m 0.63m  Arousal Index Apnea 0.0 0.0   Hypopnea 3.5 0.8   LM 0.0 0.0   Spontaneous 0.0 17.3  [facility] CPAP Summary    General Information  Name: Witney, Huie BMI: 63.51 Physician: DEDRA GORES, MD  ID: [id number] Height: 64.0 in Technician: Delon Sprung, RPSGT  Sex: Female Weight: 370.0 lb Record: [id number]  Age: 59 [04-22-1981] Date: 11/20/2023     Medical & Medication History    class 3 obesity, allergic rhinitis, intermittent asthma, chronic venous, nicotine dependence, sleep disorder, shift work. HST 09/19/23:  Calculated pAHI: 33.6/h, all obstructive events REM pAHI: 80.5/h NREM pAHI: 27.1/h,   Albuterol , Xanax  ( prescribed for the sleep study and unclear if used) , Symbicort , Wellbutrin  XL, Voltaren  Gel, Colace, Epinephrine , Lasix , Neurontin , Indocin , Lidoderm , Singulair , Bystolic , Benicar , Phentermine , Glycolax /Miralax , Kenalog .   Sleep Disorder   OSA, OSH   Comments   Patient is a 43 y/o female who was referred to the sleep lab for a CPAP titration. Patient had noted hyopneas. Patient slept supine and  lateral. Patient had moderate snoring, no PLMs, and EKG appeared to be NSR. Patient started out on 4cm h2o , EPR of 3 and ended on 11cm h2o with an EPR of 3. Patient had difficulty maintaining sleep due to back pain and coughing spells. Patient did meet 02 protocol and tech added 1 liter of 02 for low saturations. Patient had difficulty acclimating to therapy but did well overall. Patient had no complaints and tech answered all questions.    CPAP start time: 09:05:06 PM CPAP end time: 04:36:13 AM   Time Total Supine Side Prone Upright  Recording (TRT) 7h 31.86m 3h 50.38m 3h 40.60m 0h 0.75m 0h 0.57m  Sleep (TST) 5h 13.31m 2h 59.73m 2h 14.45m 0h 0.22m 0h 0.42m   Latency N1 N2 N3 REM Onset Per. Slp. Eff.  Actual 0h 17.5m 0h 17.45m 0h 48.27m 1h 59.48m 0h 17.58m 0h 17.4m 69.51%   Stg Dur Wake N1 N2 N3 REM  Total 131.5 16.5 240.0 0.5 56.5  Supine 51.0 6.0 145.0 0.5 28.0  Side 80.5 10.5 95.0 0.0 28.5  Prone 0.0 0.0 0.0 0.0 0.0  Upright 0.0 0.0 0.0 0.0 0.0   Stg % Wake N1 N2 N3 REM  Total 29.6 5.3 76.6 0.2 18.0  Supine 11.5 1.9 46.3 0.2 8.9  Side 18.1 3.3 30.3 0.0 9.1  Prone 0.0 0.0 0.0 0.0 0.0  Upright 0.0 0.0 0.0 0.0 0.0     Apnea Summary Sub Supine Side Prone Upright  Total 0 Total 0 0 0 0 0    REM 0 0 0 0 0    NREM 0 0 0 0 0  Obs 0 REM 0 0 0 0 0    NREM 0 0 0 0 0  Mix 0 REM 0 0 0 0 0    NREM 0 0 0 0 0  Cen 0 REM 0 0 0 0 0    NREM 0 0 0 0 0   Rera Summary Sub Supine Side Prone Upright  Total 0 Total 0 0 0 0 0    REM 0 0 0 0 0    NREM 0 0 0 0 0   Hypopnea Summary Sub Supine Side Prone Upright  Total 80 Total 80 40 40 0 0    REM 53 26 27 0 0    NREM 27 14 13  0 0   4% Hypopnea Summary Sub Supine Side Prone Upright  Total (4%) 68 Total 68 34 34 0 0    REM 45 23 22 0 0    NREM 23 11 12  0 0     AHI Total Obs Mix Cen  15.31 Apnea 0.00 0.00 0.00 0.00   Hypopnea 15.31 -- -- --  13.01 Hypopnea (4%) 13.01 -- -- --    Total Supine Side Prone Upright  Position  AHI 15.31 13.37 17.91 0.00 0.00   REM AHI 56.28   NREM AHI 6.30   Position RDI 15.31 13.37 17.91 0.00 0.00  REM RDI 56.28   NREM RDI 6.30    4% Hypopnea Total Supine Side Prone Upright  Position AHI (4%) 13.01 11.36 15.22 0.00 0.00  REM AHI (4%) 47.79   NREM AHI (4%) 5.37   Position RDI (4%) 13.01 11.36 15.22 0.00 0.00  REM RDI (4%) 47.79   NREM RDI (4%) 5.37         Desaturation Information  <100% <90% <80% <70% <60% <50% <40%  Supine 92 56 7 0 0 0 0  Side 112 54 13 1 0 0 0  Prone 0 0 0 0 0 0 0  Upright 0 0 0 0 0 0 0  Total 204 110 20 1 0 0 0  Desaturation threshold setting: 4% Minimum desaturation setting: 10 seconds SaO2 nadir: 58% The longest event was a 44 sec obstructive Hypopnea with a minimum SaO2 of 88%. The lowest SaO2 was 71% associated with a 18 sec obstructive Hypopnea. EKG Rates EKG Avg Max Min  Awake 68 90 48  Asleep 63 86 46  EKG Events: sinus bradycardia Awakening/Arousal Information # of Awakenings 54  Wake after sleep onset 119.45m  Wake after persistent sleep 119.32m   Arousal Assoc. Arousals Index  Apneas 0 0.0  Hypopneas 12 2.3  Leg Movements 0 0.0  Snore 0.0 0.0  PTT Arousals 0 0.0  Spontaneous 66 12.6  Total 78 14.9  Myoclonus Information PLMS LMs Index  Total LMs during PLMS 0 0.0  LMs w/ Microarousals 0 0.0   LM LMs Index  w/ Microarousal 0 0.0  w/ Awakening 0 0.0  w/ Resp Event 0 0.0  Spontaneous 1 0.2  Total 1 0.2

## 2023-12-16 NOTE — Progress Notes (Signed)
 Addendum:   Sleep study result.    This patient presented with a sleep hypopnea pattern and associated hypoxia , a finding typical for obesity hypoventilation syndrome.  The AHI improved under CPAP titration.  The treatment started under 4 cm water pressure and increased to a final explored pressure of 11 cm water, 3 cm EPR and had an insufficient  impact on hypoxia - needed 02 added.  The associated AHI  0.8 /h and based on 78 minutes of sleep time, the hypoxia time was  50 minutes ( out of 78 minutes !). The mask used was not noted in the technologist report.   1.  Hypoxemia was not resolved with therapy.  1 l of oxygen was added to the PAP airflow.  2. Total sleep time was reduced.  The quality of  sleep did not change with positive airway pressure.    RECOMMENDATIONS: The tolerance of CPAP therapy was limited due to coughing and the type of interface used was unfortunately not mentioned- I recommend a trial of CPAP- with autotitration,  beginning at 6 cm water pressure with 2 cm H20 EPR and advancing to 12 cm water with 2 cm EPR,  and heated humidification, with 2 liter oxygen to be expressed into the PAP device for the duration of the CPAP use.   See further recommendation   INTERPRETING PHYSICIAN:   Dedra Gores, MD  [facility] Sleep Board certified by The [employer] of Sleep Medicine and Diplomate of the [employer] of Sleep Medicine. Board certified In Neurology through the ABPN, Fellow of the [employer] of Neurology.

## 2023-12-17 NOTE — Telephone Encounter (Signed)
-----   Message from [place] Dohmeier sent at 12/16/2023  3:23 PM EDT ----- This patient presented with a sleep hypopnea pattern and associated hypoxia , a finding typical for obesity hypoventilation syndrome.  The AHI improved under CPAP titration.  The treatment started  under 4 cm water pressure and increased to a final explored pressure of 11 cm water, 3 cm EPR and had an insufficient  impact on hypoxia - needed 02 added.  The associated AHI  0.8 /h and based on 78 minutes of sleep time, the hypoxia time was  50 minutes ( out of 78 minutes !). The mask used was not noted in the technologist report.   1.  Hypoxemia was not resolved with therapy.  1 l of oxygen was added to the PAP airflow.  2. Total sleep time was reduced.  The quality of  sleep did not change with positive airway pressure.    RECOMMENDATIONS: The tolerance of CPAP therapy was limited due to coughing and the type of interface used was unfortunately not mentioned- I recommend a trial of CPAP- with autotitration,  beginning at 6 cm water  pressure with 2 cm H20 EPR and advancing to 12 cm water with 2 cm EPR,  and heated humidification, with 2 liter oxygen to be expressed into the PAP device for the duration of the CPAP use.   See further recommendation    ----- Message ----- From: Chalice Saunas, MD Sent: 12/16/2023   3:16 PM EDT To: Saunas Chalice, MD

## 2023-12-17 NOTE — Telephone Encounter (Signed)
 Called patient to discuss sleep study results. No answer at this time. Unable to LVM for the patient due to VM box full. Will send a mychart message as well

## 2023-12-20 ENCOUNTER — Other Ambulatory Visit (HOSPITAL_BASED_OUTPATIENT_CLINIC_OR_DEPARTMENT_OTHER): Payer: Self-pay

## 2023-12-21 ENCOUNTER — Ambulatory Visit

## 2023-12-24 ENCOUNTER — Ambulatory Visit

## 2023-12-24 ENCOUNTER — Ambulatory Visit
Admission: RE | Admit: 2023-12-24 | Discharge: 2023-12-24 | Disposition: A | Discharge status: Home / Self Care | Source: Ambulatory Visit | Attending: Obstetrics and Gynecology

## 2023-12-24 DIAGNOSIS — Z1231 Encounter for screening mammogram for malignant neoplasm of breast: Secondary | ICD-10-CM

## 2023-12-24 DIAGNOSIS — Z01419 Encounter for gynecological examination (general) (routine) without abnormal findings: Secondary | ICD-10-CM

## 2023-12-26 ENCOUNTER — Encounter: Payer: Self-pay | Admitting: Internal Medicine

## 2023-12-28 ENCOUNTER — Ambulatory Visit

## 2023-12-28 ENCOUNTER — Ambulatory Visit: Payer: Self-pay | Admitting: Family Medicine

## 2023-12-28 ENCOUNTER — Encounter: Payer: Self-pay | Admitting: Family Medicine

## 2023-12-28 ENCOUNTER — Ambulatory Visit: Admitting: Family Medicine

## 2023-12-28 VITALS — BP 136/90 | HR 73 | Temp 97.7°F | Ht 64.0 in | Wt 371.0 lb

## 2023-12-28 DIAGNOSIS — M25562 Pain in left knee: Secondary | ICD-10-CM

## 2023-12-28 DIAGNOSIS — M25462 Effusion, left knee: Secondary | ICD-10-CM | POA: Diagnosis not present

## 2023-12-28 NOTE — Progress Notes (Signed)
 Acute Office Visit  Subjective:     Patient ID: Marissa Joseph, female    DOB: [birth year], 43 y.o.   MRN: [id number]  Chief Complaint  Patient presents with   Knee Pain    Left knee pain, knee popped out of place on 07/04 has been bothering her since then. Has been icing and using heat, taking ibuprofen  and tylenol  not much relief    HPI  Discussed the use of AI scribe software for clinical note transcription with the patient, who gave verbal consent to proceed.  History of Present Illness Marissa Joseph is a 43 year old female who presents with knee pain and instability.  Left Knee instability and dislocation - Recurrent knee dislocation since childhood, with ability to manually dislocate and relocate the knee - Most recent dislocation occurred in July while entering her car - Acute episode resulted in significant pain and inability to bear weight, requiring bed rest for one day - Able to ambulate with a cane by the second day after the most recent dislocation  Left Knee pain and swelling - Pain primarily localized to the lateral aspect of the left knee - Pain worsens with pressure and when attempting to keep the leg straight for extended periods - Difficulty rising from a seated position due to pain - Occasional pain radiates from the knee to the bone and sometimes to the back of the leg - No significant pain on the opposite side of the knee - Swelling and sensation of fluid retention in the right knee  Ankle symptoms - Swelling and discomfort in the right ankle, with sensation of fluid retention  Functional limitations and assistive devices - Difficulty bearing weight on the right leg following dislocation - Requires use of a cane for ambulation after acute episodes - Uses a boot for support - Believes crutches would help reduce pressure on the knee  Pain management - Alternates between Tylenol  and ibuprofen  for pain control with minimal relief      ROS Per HPI      Objective:    BP (!) 136/90 (BP Location: Left Arm, Patient Position: Sitting)   Pulse 73   Temp 97.7 F (36.5 C) (Temporal)   Ht 5' 4 (1.626 m)   Wt (!) 371 lb (168.3 kg)   SpO2 94%   BMI 63.68 kg/m    Physical Exam Vitals and nursing note reviewed.  Constitutional:      General: She is not in acute distress.    Appearance: She is obese.  HENT:     Head: Normocephalic and atraumatic.     Right Ear: External ear normal.     Left Ear: External ear normal.     Nose: Nose normal.     Mouth/Throat:     Mouth: Mucous membranes are moist.     Pharynx: Oropharynx is clear.  Eyes:     Extraocular Movements: Extraocular movements intact.     Pupils: Pupils are equal, round, and reactive to light.  Cardiovascular:     Rate and Rhythm: Normal rate and regular rhythm.     Pulses: Normal pulses.  Pulmonary:     Effort: Pulmonary effort is normal.  Musculoskeletal:        General: Swelling and tenderness present.     Cervical back: Normal range of motion.     Right lower leg: No edema.     Left lower leg: No edema.     Comments: Superior aspect of L lateral knee.  Mild swelling, mild tenderness. LROM. Using crutches  Lymphadenopathy:     Cervical: No cervical adenopathy.  Neurological:     General: No focal deficit present.     Mental Status: She is alert and oriented to person, place, and time.  Psychiatric:        Mood and Affect: Mood normal.        Thought Content: Thought content normal.     No results found for any visits on 12/28/23.      Assessment & Plan:   Assessment and Plan Assessment & Plan Left Knee Pain Recurrent knee dislocation with swelling and lateral discomfort. Possible cartilage loss or structural issues. - Order knee X-ray to assess for fractures or structural abnormalities. - Refer to orthopedics if X-ray shows significant structural issues. - Refer to sports medicine if cartilage loss is suspected. - Continue  ibuprofen  and Tylenol  for pain management. - Advise use of ice or heat based on comfort. - Recommend avoiding prolonged knee extension.   Orders Placed This Encounter  Procedures   DG Knee 1-2 Views Left    Standing Status:   Future    Number of Occurrences:   1    Expiration Date:   06/29/2024    Reason for Exam (SYMPTOM  OR DIAGNOSIS REQUIRED):   left knee pain    Is the patient pregnant?:   No    Preferred imaging location?:   [facility]   Ambulatory referral to Orthopedic Surgery    Referral Priority:   Routine    Referral Type:   Surgical    Referral Reason:   Specialty Services Required    Requested Specialty:   Orthopedic Surgery    Number of Visits Requested:   1     No orders of the defined types were placed in this encounter.   Return if symptoms worsen or fail to improve.  Corean LITTIE Ku, FNP

## 2023-12-28 NOTE — Patient Instructions (Signed)
 We are getting an xray today. We will be in contact with any abnormal results that require further attention.  We will refer you to sports medicine or orthopedics based on results of the xray  Continue ibuprofen  and tylenol  as needed

## 2023-12-31 ENCOUNTER — Other Ambulatory Visit (HOSPITAL_BASED_OUTPATIENT_CLINIC_OR_DEPARTMENT_OTHER): Payer: Self-pay

## 2024-01-08 ENCOUNTER — Other Ambulatory Visit (HOSPITAL_BASED_OUTPATIENT_CLINIC_OR_DEPARTMENT_OTHER): Payer: Self-pay

## 2024-01-08 ENCOUNTER — Encounter: Payer: Self-pay | Admitting: Family Medicine

## 2024-01-08 ENCOUNTER — Ambulatory Visit: Admitting: Family Medicine

## 2024-01-08 VITALS — BP 148/86 | HR 60 | Temp 98.0°F | Ht 64.0 in | Wt 365.0 lb

## 2024-01-08 DIAGNOSIS — R632 Polyphagia: Secondary | ICD-10-CM

## 2024-01-08 DIAGNOSIS — G4733 Obstructive sleep apnea (adult) (pediatric): Secondary | ICD-10-CM | POA: Diagnosis not present

## 2024-01-08 DIAGNOSIS — I10 Essential (primary) hypertension: Secondary | ICD-10-CM

## 2024-01-08 DIAGNOSIS — F419 Anxiety disorder, unspecified: Secondary | ICD-10-CM

## 2024-01-08 DIAGNOSIS — G8929 Other chronic pain: Secondary | ICD-10-CM | POA: Diagnosis not present

## 2024-01-08 DIAGNOSIS — F32A Depression, unspecified: Secondary | ICD-10-CM

## 2024-01-08 DIAGNOSIS — Z7289 Other problems related to lifestyle: Secondary | ICD-10-CM

## 2024-01-08 DIAGNOSIS — E66813 Obesity, class 3: Secondary | ICD-10-CM | POA: Diagnosis not present

## 2024-01-08 DIAGNOSIS — M25562 Pain in left knee: Secondary | ICD-10-CM

## 2024-01-08 DIAGNOSIS — M25561 Pain in right knee: Secondary | ICD-10-CM

## 2024-01-08 DIAGNOSIS — Z6841 Body Mass Index (BMI) 40.0 and over, adult: Secondary | ICD-10-CM

## 2024-01-08 DIAGNOSIS — Z9189 Other specified personal risk factors, not elsewhere classified: Secondary | ICD-10-CM

## 2024-01-08 MED ORDER — TOPIRAMATE 50 MG PO TABS
50.0000 mg | ORAL_TABLET | Freq: Two times a day (BID) | ORAL | 0 refills | Status: DC
  Filled 2024-01-08: qty 60, 30d supply, fill #0

## 2024-01-08 NOTE — Progress Notes (Signed)
 Office: [phone]  /  Fax: [phone]  WEIGHT SUMMARY AND BIOMETRICS  Starting Date: 03/08/23  Starting Weight: 369lb   Weight Lost Since Last Visit: 6lb   Vitals Temp: 98 F (36.7 C) BP: (!) 148/86 Pulse Rate: 60 SpO2: 100 %   Body Composition  Body Fat %: 62.4 % Fat Mass (lbs): 228.2 lbs Muscle Mass (lbs): 130.4 lbs Visceral Fat Rating : 27   HPI  Chief Complaint: OBESITY  Marissa Joseph is here to discuss her progress with her obesity treatment plan. She is on the the Category 3 Plan and states she is following her eating plan approximately 65 % of the time. She states she is exercising 45 minutes 1 times per week.  Interval History:  Since last office visit she is down 6 pounds Since her recent weight loss of 4 pounds in the past 10 months of medically supervised weight management She has been taking Lomaira  8 mg tab twice daily and topiramate  50 mg twice daily for both appetite and food impulse control She reports improvement in hunger and cravings with medication and denies adverse side effects other than paresthesias and brain fog. 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 Body mass index is 62.65 kg/m.  General: She is overweight, cooperative, alert, well developed, and in no  acute distress. PSYCH: Has normal mood, affect and thought process.  Tearful Lungs: Normal breathing effort, no conversational dyspnea.   ASSESSMENT AND PLAN  TREATMENT PLAN FOR OBESITY:  Recommended Dietary Goals  Marissa Joseph is currently in the action stage of change. As such, her goal is to continue weight management plan. She has agreed to the Category 3 Plan, keeping a food journal and adhering to recommended goals of 1600 calories and 120 g protein, and practicing portion control and making smarter food choices, such as increasing vegetables and decreasing simple carbohydrates.  Behavioral Intervention  We discussed the following Behavioral Modification Strategies today: increasing lean protein intake to established goals, increasing fiber rich foods, increasing water intake , work on meal planning and preparation, work on tracking and journaling calories using tracking application, keeping healthy foods at home, decreasing eating out or consumption of processed foods, and making healthy choices when eating convenient foods, practice mindfulness eating and understand the difference between hunger signals and cravings, work on managing stress, creating time for self-care and relaxation, avoiding temptations and identifying enticing environmental cues, continue to practice mindfulness when eating, better snacking choices, and continue to work on maintaining a reduced calorie state, getting the recommended amount of protein, incorporating whole foods, making healthy choices, staying well hydrated and practicing mindfulness when eating..  Additional resources provided today: NA  Recommended Physical Activity Goals  Marissa Joseph has been advised to work up to 150 minutes of moderate intensity  aerobic activity a week and strengthening exercises 2-3 times per week for cardiovascular health, weight loss maintenance and preservation of muscle mass.   She has agreed to Think about enjoyable ways to increase  daily physical activity and overcoming barriers to exercise and Increase physical activity in their day and reduce sedentary time (increase NEAT).  Pharmacotherapy changes for the treatment of obesity: None.  She declines Zepbound  prescription  ASSOCIATED CONDITIONS ADDRESSED TODAY  Polyphagia -     Topiramate ; Take 1 tablet (50 mg total) by mouth 2 (two) times daily.  Dispense: 60 tablet; Refill: 0 Slightly improved with topiramate  50 mg twice daily She has done a better job with increasing lean protein and fiber intake Encouraged improvement in water intake with at least 100 ounces per day Avoid pregnancy while on topiramate  due to risk of teratogenic side effects  Class 3 severe obesity due to excess calories with body mass index (BMI) of 60.0 to 69.9 in adult Reviewed patient's results over 10 months of medically supervised weight management, down only 4 pounds.  She is now on Lomaira  and topiramate .  She has room for improvement with both diet and exercise compliance.  With her high BMI, she has declined bariatric surgery.  We also discussed weight loss percent on Zepbound  or Wegovy versus oral antiobesity medication.  She declined a Zepbound  prior authorization today so there is better data with Zepbound  versus phentermine .  Sedentary lifestyle Unchanged.  Knee pain has been limiting her ability to exercise  Anxiety and depression She has lacked motivation and stress levels including isolating to her progress over the past 10 months of medically supervised weight management.  She is currently on Wellbutrin  XL 150 mg once daily by her PCP.  She denies anxiety from her marijuana use.  Stress levels have been high.  We discussed the importance of addressing mood and weight reduction.  Consider the addition of counseling  Chronic pain of both knees Worsening.  Knee pain has been a factor in her lack of exercise between visits.  Encouraged her to follow-up with her PCP for further  evaluation and treatment.  Will not increase exercise until this improves.  Essential hypertension Blood pressure is slightly high today.  She has not taken her Lomaira  for the past 5 days.  She is currently on Benicar  HCT 20/12.5 mg once daily, prednisone  2.5 mg once daily.  Will closely monitor blood pressure with use of Lomaira .  Continue current medications and active plan for weight reduction  OSA (obstructive sleep apnea) She plans to call sleep medicine doctor to discuss restarting BiPAP use. Continue active plan for weight reduction and work on 7 to 8 hours of high-quality sleep at night     She was informed of the importance of frequent follow up visits to maximize her success with intensive lifestyle modifications for her multiple health conditions.   ATTESTASTION STATEMENTS:  Reviewed by clinician on day of visit: allergies, medications, problem list, medical history, surgical history, family history, social history, and previous encounter notes pertinent to obesity diagnosis.   I have personally spent 32 minutes total time today in preparation, patient care, nutritional counseling and education,  and documentation for this visit, including the following: review of most recent clinical lab tests, prescribing medications/ refilling medications, reviewing medical assistant documentation, review and interpretation of bioimpedence results.     Marissa Joseph, D.O. DABFM, DABOM [facility] [address] [place], [place] [address] [phone]

## 2024-01-08 NOTE — Patient Instructions (Signed)
 Take a Women's Multivitamin with iron daily  Finish out Lomaira  8 mg 2 x a day for appetite control Continue Topiramate  50 mg 2 x a day for food impulse control  Great job with lean protein at meals! Do not skip meals Remember your fruits and veggies Aim for a good 1600 cal/ day This should include 100-120 g of protein daily  Call about your Bipap  Hydrate well with water

## 2024-01-09 ENCOUNTER — Ambulatory Visit: Admitting: Orthopaedic Surgery

## 2024-01-09 ENCOUNTER — Encounter: Payer: Self-pay | Admitting: Neurology

## 2024-01-09 ENCOUNTER — Ambulatory Visit (INDEPENDENT_AMBULATORY_CARE_PROVIDER_SITE_OTHER): Admitting: Sports Medicine

## 2024-01-09 ENCOUNTER — Other Ambulatory Visit: Payer: Self-pay

## 2024-01-09 DIAGNOSIS — G8929 Other chronic pain: Secondary | ICD-10-CM

## 2024-01-09 DIAGNOSIS — M1712 Unilateral primary osteoarthritis, left knee: Secondary | ICD-10-CM

## 2024-01-09 DIAGNOSIS — Z6841 Body Mass Index (BMI) 40.0 and over, adult: Secondary | ICD-10-CM | POA: Insufficient documentation

## 2024-01-09 DIAGNOSIS — M25462 Effusion, left knee: Secondary | ICD-10-CM

## 2024-01-09 DIAGNOSIS — M25562 Pain in left knee: Secondary | ICD-10-CM | POA: Diagnosis not present

## 2024-01-09 MED ORDER — METHYLPREDNISOLONE ACETATE 40 MG/ML IJ SUSP
80.0000 mg | INTRAMUSCULAR | Status: AC | PRN
  Administered 2024-01-09: 80 mg via INTRA_ARTICULAR

## 2024-01-09 MED ORDER — BUPIVACAINE HCL 0.25 % IJ SOLN
2.0000 mL | INTRAMUSCULAR | Status: AC | PRN
  Administered 2024-01-09: 2 mL via INTRA_ARTICULAR

## 2024-01-09 MED ORDER — LIDOCAINE HCL 1 % IJ SOLN
5.0000 mL | INTRAMUSCULAR | Status: AC | PRN
  Administered 2024-01-09: 5 mL

## 2024-01-09 NOTE — Progress Notes (Signed)
 Office Visit Note   Patient: Marissa Joseph           Date of Birth: 02-Sep-1980           MRN: [id number] Visit Date: 01/09/2024              Requested by: Alvia Corean LITTIE, FNP [address] [place],  [place] [address] PCP: Geofm Glade PARAS, MD   Assessment & Plan: Visit Diagnoses:  1. Primary osteoarthritis of left knee   2. BMI 60.0-69.9, adult [facility])     Plan: History of Present Illness Marissa Joseph is a 43 year old female who presents with knee swelling and tenderness.  About a month ago, her kneecap dislocated while she was getting into her car. It usually relocates spontaneously, but she had to manually reduce it this time. She experiences a sensation of 'popping' and significant swelling and tenderness in the knee area since the incident. There is considerable fluid accumulation, causing the knee to become particularly puffy. Her job as a Science writer requires prolonged sitting, which may exacerbate her symptoms. She recalls that a cortisone shot helped alleviate her symptoms in the past and inquires about the possibility of having fluid drained from her knee to relieve the swelling. Additionally, she has issues with her ankle that she feels need evaluation.  Examination of the left knee shows good pain-free range of motion with crepitus.  Collaterals are grossly intact.  Difficult to assess for effusion given the soft tissue envelope.  Difficult to assess patellar tracking.  Assessment and Plan Right knee osteoarthritis with effusion Chronic osteoarthritis with effusion due to arthritis.  Difficult to say if she suffered a true patellar dislocation or just symptoms of advanced patellofemoral arthritis. - Refer to Dr. Burnetta for ultrasound-guided knee aspiration and cortisone injection due to soft tissue envelope.  I do not feel that I can accurately get into her knee.  Follow-Up Instructions: No follow-ups on file.   Orders:  No orders of the  defined types were placed in this encounter.  No orders of the defined types were placed in this encounter.    Subjective: Chief Complaint  Patient presents with   Left Knee - Pain    HPI  Review of Systems  Constitutional: Negative.   HENT: Negative.    Eyes: Negative.   Respiratory: Negative.    Cardiovascular: Negative.   Endocrine: Negative.   Musculoskeletal: Negative.   Neurological: Negative.   Hematological: Negative.   Psychiatric/Behavioral: Negative.    All other systems reviewed and are negative.    Objective: Vital Signs: LMP 09/19/2023 (Approximate)   Physical Exam Vitals and nursing note reviewed.  Constitutional:      Appearance: She is well-developed.  HENT:     Head: Atraumatic.     Nose: Nose normal.  Eyes:     Extraocular Movements: Extraocular movements intact.  Cardiovascular:     Pulses: Normal pulses.  Pulmonary:     Effort: Pulmonary effort is normal.  Abdominal:     Palpations: Abdomen is soft.  Musculoskeletal:     Cervical back: Neck supple.  Skin:    General: Skin is warm.     Capillary Refill: Capillary refill takes less than 2 seconds.  Neurological:     Mental Status: She is alert. Mental status is at baseline.  Psychiatric:        Behavior: Behavior normal.        Thought Content: Thought content normal.  Judgment: Judgment normal.     Ortho Exam  Specialty Comments:  No specialty comments available.  Imaging: No results found.   PMFS History: Patient Active Problem List   Diagnosis Date Noted   BMI 60.0-69.9, adult (HCC) 01/09/2024   Acute pain of left knee 12/28/2023   Displacement of cervical intervertebral disc 11/14/2023   Obesity hypoventilation syndrome (HCC) 10/14/2023   Left leg cellulitis 10/11/2023   Sleep disorder, shift-work 08/30/2023   Nicotine dependence, other tobacco product, uncomplicated 08/30/2023   Sedentary lifestyle 06/14/2023   Polyphagia 03/22/2023   Essential  hypertension 03/08/2023   Chronic intermittent hypoxia with obstructive sleep apnea 03/08/2023   SOBOE (shortness of breath on exertion) 03/08/2023   Other fatigue 03/08/2023   Class 3 obesity with alveolar hypoventilation, serious comorbidity, and body mass index (BMI) of 60.0 to 69.9 in adult (HCC) 03/08/2023   Vitamin D  deficiency 01/11/2023   Depression 11/06/2022   Positive RPR test 08/18/2022   Mixed stress and urge incontinence 08/17/2022   Chronic pelvic pain in female 08/17/2022   Right leg pain 03/08/2022   Chronic pain syndrome 01/11/2022   Palpitations 07/13/2021   Acute right ankle pain 02/16/2021   Bilateral leg edema 03/31/2020   Constipation 02/04/2020   Cellulitis of left lower extremity 12/17/2019   Derangement of right knee 07/29/2019   Acute lateral meniscus tear of right knee 07/29/2019   Acute medial meniscus tear of right knee 07/29/2019   Morbid obesity with BMI of 60.0-69.9, adult (HCC) 07/29/2019   Scapular dyskinesis 10/07/2018   Chronic back pain 09/25/2018   Adjustment disorder with mixed anxiety and depressed mood 05/10/2018   Amenorrhea 10/10/2017   Ventral hernia without obstruction or gangrene 07/13/2017   Hearing difficulty 11/24/2016   Prediabetes 09/06/2015   Smoking 12/22/2013   Osteoarthritis of left knee 08/29/2013   Pap smear of cervix with ASCUS, cannot exclude HGSIL 06/25/2013   Fibroid uterus 05/22/2013   Dysmenorrhea 05/22/2013   Monilial intertrigo 01/06/2013   Allergic rhinitis 04/10/2008   Chronic venous insufficiency of lower extremity 08/06/2007   Eczema 02/21/2007   Class 3 severe obesity due to excess calories with serious comorbidity and body mass index (BMI) of 60.0 to 69.9 in adult 01/21/2007   Asthma 01/21/2007   Past Medical History:  Diagnosis Date   ADD (attention deficit disorder)    Anxiety    no official dx   Asthma    Back pain    Depression    Eczema    Edema    Fibroids    GONORRHEA 03/25/2008    Qualifier: Diagnosis of  By: [place]     Heart murmur    Heart murmur    Hypertension    Joint pain    MRSA (methicillin resistant staph aureus) culture positive 05/2012   cellulits/ left leg   Multiple food allergies    Obesity    Sleep apnea     Family History  Problem Relation Age of Onset   Diabetes Mother    Diabetes Father    Breast cancer Maternal Uncle    Cancer Maternal Uncle        prostate   Breast cancer Paternal Aunt    Cancer Paternal Aunt        lung   Heart disease Maternal Grandmother    Diabetes Maternal Grandmother    Stroke Maternal Grandfather     Past Surgical History:  Procedure Laterality Date   NO PAST SURGERIES  TEE WITHOUT CARDIOVERSION N/A 12/22/2019   Procedure: TRANSESOPHAGEAL ECHOCARDIOGRAM (TEE);  Surgeon: Kate Lonni CROME, MD;  Location[facility] ENDOSCOPY;  Service: Cardiovascular;  Laterality: N/A;   Social History   Occupational History   Not on file  Tobacco Use   Smoking status: Former    Current packs/day: 0.20    Average packs/day: 0.2 packs/day for 5.0 years (1.0 ttl pk-yrs)    Types: Cigarettes   Smokeless tobacco: Former   Tobacco comments:    Education administrator Use   Vaping status: Every Day  Substance and Sexual Activity   Alcohol use: Not Currently    Comment: occ   Drug use: Yes    Types: Marijuana    Comment: pot-about 1 joint BID   Sexual activity: Yes    Birth control/protection: Condom

## 2024-01-09 NOTE — Progress Notes (Signed)
   Procedure Note  Patient: Marissa Joseph             Date of Birth: 1980/12/21           MRN: [id number]             Visit Date: 01/09/2024  Procedures: Visit Diagnoses:  1. Chronic pain of left knee   2. Unilateral primary osteoarthritis, left knee   3. Effusion, left knee    Large Joint Inj: L knee on 01/09/2024 11:50 AM Indications: pain and joint swelling Details: 22 G 3.5 in needle, ultrasound-guided superolateral approach Medications: 5 mL lidocaine  1 %; 2 mL bupivacaine  0.25 %; 80 mg methylPREDNISolone  acetate 40 MG/ML Aspirate: 8 mL clear and yellow Outcome: tolerated well, no immediate complications  US -guided Knee Aspiration, Left: After discussion on risks/benefits/indications was provided, informed verbal consent was obtained and a timeout was performed, patient was lying supine on exam table with knee bolster pillow in place. The knee was prepped with Chloraprep and multiple alcohol swabs. Utilizing superolateral approach, approximately 5 mL of lidocaine  1% was used for local anesthesia. Then using ultrasound guidance via a transverse-axis and in-plane approach, an 18g, 3.5 needle was inserted and 8 mL of clear straw-colored fluid was aspirated from the knee joint. Utilizing the same portal and a sterile syringe swap, the knee joint was then  injected under ultrasound guidance with 2:2 bupivicaine:depomedrol with flow of the injectate visualized going into the knee joint.  Patient tolerated procedure well without immediate complications.  Procedure, treatment alternatives, risks and benefits explained, specific risks discussed. Consent was given by the patient. Immediately prior to procedure a time out was called to verify the correct patient, procedure, equipment, support staff and site/side marked as required. Patient was prepped and draped in the usual sterile fashion.     - patient tolerated procedure well, discussed post-injection protocol - follow-up with Dr. Jerri  as indicated; I am happy to see them as needed  Lonell Sprang, DO Primary Care Sports Medicine Physician  [facility] - Orthopedics  This note was dictated using Dragon naturally speaking software and may contain errors in syntax, spelling, or content which have not been identified prior to signing this note.

## 2024-01-13 ENCOUNTER — Encounter: Payer: Self-pay | Admitting: Internal Medicine

## 2024-01-13 NOTE — Progress Notes (Unsigned)
 Subjective:    Patient ID: Marissa Joseph, female    DOB: 17-Nov-1980, 43 y.o.   MRN: [id number]      HPI Gudelia is here for a Physical exam and her chronic medical problems.   Docu lymphedema   Medications and allergies reviewed with patient and updated if appropriate.  Current Outpatient Medications on File Prior to Visit  Medication Sig Dispense Refill   albuterol  (VENTOLIN  HFA) 108 (90 Base) MCG/ACT inhaler Inhale 1-2 puffs into the lungs every 6 (six) hours as needed for wheezing 6.7 g 5   ALPRAZolam  (XANAX ) 0.5 MG tablet Take 1 tablet (0.5 mg total) by mouth at bedtime as needed for anxiety or sleep (for sleep test use). 2 tablet 0   budesonide -formoterol  (SYMBICORT ) 80-4.5 MCG/ACT inhaler Inhale 2 puffs into the lungs 2 (two) times daily. 10.2 g 3   buPROPion  (WELLBUTRIN  XL) 150 MG 24 hr tablet Take 1 tablet (150 mg total) by mouth daily. 30 tablet 5   diclofenac  Sodium (VOLTAREN ) 1 % GEL Apply 4 g topically 4 (four) times daily as needed. 150 g 0   docusate sodium  (COLACE) 100 MG capsule Take 1 capsule (100 mg total) by mouth 2 (two) times daily. 60 capsule 0   EPINEPHrine  0.3 mg/0.3 mL IJ SOAJ injection Inject 0.3 mg into the muscle as needed for anaphylaxis. 2 each 0   fluconazole  (DIFLUCAN ) 150 MG tablet Take 1 tablet by mouth once every 3 days as needed 2 tablet 1   furosemide  (LASIX ) 20 MG tablet Take 1 tablet (20 mg total) by mouth daily. 90 tablet 1   gabapentin  (NEURONTIN ) 100 MG capsule Take 2 capsules (200 mg total) by mouth at bedtime. 60 capsule 5   indomethacin  (INDOCIN ) 50 MG capsule TAKE 1 CAPSULE BY MOUTH THREE TIMES DAILY WITH MEALS     lidocaine  (LIDODERM ) 5 % Place 1 patch onto the skin daily. Remove & Discard patch within 12 hours or as directed by MD 30 patch 0   medroxyPROGESTERone  (PROVERA ) 10 MG tablet Take 1 tablet (10 mg total) by mouth daily. Take for days to bring on menses 10 tablet 0   montelukast  (SINGULAIR ) 10 MG tablet Take 1 tablet (10  mg total) by mouth at bedtime. 90 tablet 1   nebivolol  (BYSTOLIC ) 2.5 MG tablet Take 1 tablet (2.5 mg total) by mouth daily. 30 tablet 5   olmesartan -hydrochlorothiazide  (BENICAR  HCT) 20-12.5 MG tablet Take 1 tablet by mouth daily. 90 tablet 1   Phentermine  HCl (LOMAIRA ) 8 MG TABS Take 1 tablet (8 mg total) by mouth 2 (two) times daily. Take 30 minutes before first meal of the day and 1 hour after 2nd meal of the day. 60 tablet 0   polyethylene glycol powder (GLYCOLAX /MIRALAX ) 17 GM/SCOOP powder Take 17 g by mouth 2 (two) times daily as needed for severe constipation. 238 g 1   sulfamethoxazole -trimethoprim  (BACTRIM  DS) 800-160 MG tablet Take 1 tablet by mouth 2 (two) times daily. 20 tablet 0   topiramate  (TOPAMAX ) 50 MG tablet Take 1 tablet (50 mg total) by mouth 2 (two) times daily. 60 tablet 0   triamcinolone  cream (KENALOG ) 0.1 % Apply 1 Application topically 2 (two) times daily. 30 g 5   No current facility-administered medications on file prior to visit.    Review of Systems     Objective:  There were no vitals filed for this visit. There were no vitals filed for this visit. There is no height or weight on file  to calculate BMI.  BP Readings from Last 3 Encounters:  01/08/24 (!) 148/86  12/28/23 (!) 136/90  12/10/23 132/84    Wt Readings from Last 3 Encounters:  01/08/24 (!) 365 lb (165.6 kg)  12/28/23 (!) 371 lb (168.3 kg)  12/10/23 (!) 371 lb (168.3 kg)       Physical Exam Constitutional: She appears well-developed and well-nourished. No distress.  HENT:  Head: Normocephalic and atraumatic.  Right Ear: External ear normal. Normal ear canal and TM Left Ear: External ear normal.  Normal ear canal and TM Mouth/Throat: Oropharynx is clear and moist.  Eyes: Conjunctivae normal.  Neck: Neck supple. No tracheal deviation present. No thyromegaly present.  No carotid bruit  Cardiovascular: Normal rate, regular rhythm and normal heart sounds.   No murmur heard.  No  edema. Pulmonary/Chest: Effort normal and breath sounds normal. No respiratory distress. She has no wheezes. She has no rales.  Breast: deferred   Abdominal: Soft. She exhibits no distension. There is no tenderness.  Lymphadenopathy: She has no cervical adenopathy.  Skin: Skin is warm and dry. She is not diaphoretic.  Psychiatric: She has a normal mood and affect. Her behavior is normal.     Lab Results  Component Value Date   WBC 6.2 01/12/2023   HGB 12.3 01/12/2023   HCT 38.0 01/12/2023   PLT 248.0 01/12/2023   GLUCOSE 84 01/12/2023   CHOL 127 01/12/2023   TRIG 94.0 01/12/2023   HDL 41.30 01/12/2023   LDLCALC 67 01/12/2023   ALT 14 01/12/2023   AST 17 01/12/2023   NA 137 01/12/2023   K 4.1 01/12/2023   CL 102 01/12/2023   CREATININE 0.91 01/12/2023   BUN 16 01/12/2023   CO2 31 01/12/2023   TSH 1.010 11/14/2023   HGBA1C 5.4 01/12/2023         Assessment & Plan:   Physical exam: Screening blood work  ordered Exercise   Weight   Substance abuse  none   Reviewed recommended immunizations.   Health Maintenance  Topic Date Due   Hepatitis B Vaccines (1 of 3 - 19+ 3-dose series) Never done   HPV VACCINES (1 - 3-dose SCDM series) Never done   COVID-19 Vaccine (1 - 2024-25 season) Never done   INFLUENZA VACCINE  01/04/2024   Pneumococcal Vaccine: 19-49 Years (2 of 2 - PCV) 07/15/2024 (Originally 06/04/2013)   MAMMOGRAM  12/23/2025   Cervical Cancer Screening (HPV/Pap Cotest)  08/17/2027   DTaP/Tdap/Td (3 - Td or Tdap) 11/10/2027   Hepatitis C Screening  Completed   HIV Screening  Completed   Meningococcal B Vaccine  Aged Out          See Problem List for Assessment and Plan of chronic medical problems.

## 2024-01-13 NOTE — Patient Instructions (Addendum)
 Blood work was ordered.       Medications changes include :   increase bupropion  xl to 300 mg daily.  Increase nebivolol  to 5 mg daily    A referral was ordered for a therapist and physical therapy and someone will call you to schedule an appointment.     Return in about 6 months (around 07/16/2024) for follow up.   Health Maintenance, Female Adopting a healthy lifestyle and getting preventive care are important in promoting health and wellness. Ask your health care provider about: The right schedule for you to have regular tests and exams. Things you can do on your own to prevent diseases and keep yourself healthy. What should I know about diet, weight, and exercise? Eat a healthy diet  Eat a diet that includes plenty of vegetables, fruits, low-fat dairy products, and lean protein. Do not eat a lot of foods that are high in solid fats, added sugars, or sodium. Maintain a healthy weight Body mass index (BMI) is used to identify weight problems. It estimates body fat based on height and weight. Your health care provider can help determine your BMI and help you achieve or maintain a healthy weight. Get regular exercise Get regular exercise. This is one of the most important things you can do for your health. Most adults should: Exercise for at least 150 minutes each week. The exercise should increase your heart rate and make you sweat (moderate-intensity exercise). Do strengthening exercises at least twice a week. This is in addition to the moderate-intensity exercise. Spend less time sitting. Even light physical activity can be beneficial. Watch cholesterol and blood lipids Have your blood tested for lipids and cholesterol at 43 years of age, then have this test every 5 years. Have your cholesterol levels checked more often if: Your lipid or cholesterol levels are high. You are older than 43 years of age. You are at high risk for heart disease. What should I know about  cancer screening? Depending on your health history and family history, you may need to have cancer screening at various ages. This may include screening for: Breast cancer. Cervical cancer. Colorectal cancer. Skin cancer. Lung cancer. What should I know about heart disease, diabetes, and high blood pressure? Blood pressure and heart disease High blood pressure causes heart disease and increases the risk of stroke. This is more likely to develop in people who have high blood pressure readings or are overweight. Have your blood pressure checked: Every 3-5 years if you are 21-85 years of age. Every year if you are 104 years old or older. Diabetes Have regular diabetes screenings. This checks your fasting blood sugar level. Have the screening done: Once every three years after age 57 if you are at a normal weight and have a low risk for diabetes. More often and at a younger age if you are overweight or have a high risk for diabetes. What should I know about preventing infection? Hepatitis B If you have a higher risk for hepatitis B, you should be screened for this virus. Talk with your health care provider to find out if you are at risk for hepatitis B infection. Hepatitis C Testing is recommended for: Everyone born from [birth year] through 1965. Anyone with known risk factors for hepatitis C. Sexually transmitted infections (STIs) Get screened for STIs, including gonorrhea and chlamydia, if: You are sexually active and are younger than 43 years of age. You are older than 43 years of age and your  health care provider tells you that you are at risk for this type of infection. Your sexual activity has changed since you were last screened, and you are at increased risk for chlamydia or gonorrhea. Ask your health care provider if you are at risk. Ask your health care provider about whether you are at high risk for HIV. Your health care provider may recommend a prescription medicine to help prevent HIV  infection. If you choose to take medicine to prevent HIV, you should first get tested for HIV. You should then be tested every 3 months for as long as you are taking the medicine. Pregnancy If you are about to stop having your period (premenopausal) and you may become pregnant, seek counseling before you get pregnant. Take 400 to 800 micrograms (mcg) of folic acid  every day if you become pregnant. Ask for birth control (contraception) if you want to prevent pregnancy. Osteoporosis and menopause Osteoporosis is a disease in which the bones lose minerals and strength with aging. This can result in bone fractures. If you are 70 years old or older, or if you are at risk for osteoporosis and fractures, ask your health care provider if you should: Be screened for bone loss. Take a calcium or vitamin D  supplement to lower your risk of fractures. Be given hormone replacement therapy (HRT) to treat symptoms of menopause. Follow these instructions at home: Alcohol use Do not drink alcohol if: Your health care provider tells you not to drink. You are pregnant, may be pregnant, or are planning to become pregnant. If you drink alcohol: Limit how much you have to: 0-1 drink a day. Know how much alcohol is in your drink. In the [place] one drink equals one 12 oz bottle of beer (355 mL), one 5 oz glass of wine (148 mL), or one 1 oz glass of hard liquor (44 mL). Lifestyle Do not use any products that contain nicotine or tobacco. These products include cigarettes, chewing tobacco, and vaping devices, such as e-cigarettes. If you need help quitting, ask your health care provider. Do not use street drugs. Do not share needles. Ask your health care provider for help if you need support or information about quitting drugs. General instructions Schedule regular health, dental, and eye exams. Stay current with your vaccines. Tell your health care provider if: You often feel depressed. You have ever been abused  or do not feel safe at home. Summary Adopting a healthy lifestyle and getting preventive care are important in promoting health and wellness. Follow your health care provider's instructions about healthy diet, exercising, and getting tested or screened for diseases. Follow your health care provider's instructions on monitoring your cholesterol and blood pressure. This information is not intended to replace advice given to you by your health care provider. Make sure you discuss any questions you have with your health care provider. Document Revised: 10/11/2020 Document Reviewed: 10/11/2020 Elsevier Patient Education  2024 [employer].

## 2024-01-14 ENCOUNTER — Other Ambulatory Visit (HOSPITAL_BASED_OUTPATIENT_CLINIC_OR_DEPARTMENT_OTHER): Payer: Self-pay

## 2024-01-14 ENCOUNTER — Ambulatory Visit (INDEPENDENT_AMBULATORY_CARE_PROVIDER_SITE_OTHER): Payer: 59 | Admitting: Internal Medicine

## 2024-01-14 ENCOUNTER — Ambulatory Visit: Payer: Self-pay | Admitting: Internal Medicine

## 2024-01-14 VITALS — BP 132/70 | HR 79 | Temp 98.2°F | Ht 64.0 in | Wt 364.0 lb

## 2024-01-14 DIAGNOSIS — J4520 Mild intermittent asthma, uncomplicated: Secondary | ICD-10-CM

## 2024-01-14 DIAGNOSIS — Z6841 Body Mass Index (BMI) 40.0 and over, adult: Secondary | ICD-10-CM | POA: Diagnosis not present

## 2024-01-14 DIAGNOSIS — M25562 Pain in left knee: Secondary | ICD-10-CM

## 2024-01-14 DIAGNOSIS — R7303 Prediabetes: Secondary | ICD-10-CM

## 2024-01-14 DIAGNOSIS — E559 Vitamin D deficiency, unspecified: Secondary | ICD-10-CM | POA: Diagnosis not present

## 2024-01-14 DIAGNOSIS — I10 Essential (primary) hypertension: Secondary | ICD-10-CM | POA: Diagnosis not present

## 2024-01-14 DIAGNOSIS — R21 Rash and other nonspecific skin eruption: Secondary | ICD-10-CM

## 2024-01-14 DIAGNOSIS — Z0000 Encounter for general adult medical examination without abnormal findings: Secondary | ICD-10-CM

## 2024-01-14 DIAGNOSIS — E662 Morbid (severe) obesity with alveolar hypoventilation: Secondary | ICD-10-CM

## 2024-01-14 DIAGNOSIS — M549 Dorsalgia, unspecified: Secondary | ICD-10-CM

## 2024-01-14 DIAGNOSIS — R600 Localized edema: Secondary | ICD-10-CM

## 2024-01-14 DIAGNOSIS — G8929 Other chronic pain: Secondary | ICD-10-CM

## 2024-01-14 DIAGNOSIS — I890 Lymphedema, not elsewhere classified: Secondary | ICD-10-CM

## 2024-01-14 DIAGNOSIS — F4323 Adjustment disorder with mixed anxiety and depressed mood: Secondary | ICD-10-CM

## 2024-01-14 LAB — CBC WITH DIFFERENTIAL/PLATELET
Basophils Absolute: 0.1 K/uL (ref 0.0–0.1)
Basophils Relative: 1.2 % (ref 0.0–3.0)
Eosinophils Absolute: 0.1 K/uL (ref 0.0–0.7)
Eosinophils Relative: 2.3 % (ref 0.0–5.0)
HCT: 41.6 % (ref 36.0–46.0)
Hemoglobin: 13.8 g/dL (ref 12.0–15.0)
Lymphocytes Relative: 42.4 % (ref 12.0–46.0)
Lymphs Abs: 1.9 K/uL (ref 0.7–4.0)
MCHC: 33.1 g/dL (ref 30.0–36.0)
MCV: 91.5 fl (ref 78.0–100.0)
Monocytes Absolute: 0.5 K/uL (ref 0.1–1.0)
Monocytes Relative: 11.4 % (ref 3.0–12.0)
Neutro Abs: 2 K/uL (ref 1.4–7.7)
Neutrophils Relative %: 42.7 % — ABNORMAL LOW (ref 43.0–77.0)
Platelets: 236 K/uL (ref 150.0–400.0)
RBC: 4.54 Mil/uL (ref 3.87–5.11)
RDW: 14.1 % (ref 11.5–15.5)
WBC: 4.6 K/uL (ref 4.0–10.5)

## 2024-01-14 LAB — LIPID PANEL
Cholesterol: 125 mg/dL (ref 0–200)
HDL: 37.2 mg/dL — ABNORMAL LOW (ref >39.00)
LDL Cholesterol: 70 mg/dL (ref 0–99)
NonHDL: 87.93
Total CHOL/HDL Ratio: 3
Triglycerides: 91 mg/dL (ref 0.0–149.0)
VLDL: 18.2 mg/dL (ref 0.0–40.0)

## 2024-01-14 LAB — COMPREHENSIVE METABOLIC PANEL WITH GFR
ALT: 12 U/L (ref 0–35)
AST: 13 U/L (ref 0–37)
Albumin: 4 g/dL (ref 3.5–5.2)
Alkaline Phosphatase: 64 U/L (ref 39–117)
BUN: 23 mg/dL (ref 6–23)
CO2: 31 meq/L (ref 19–32)
Calcium: 9.4 mg/dL (ref 8.4–10.5)
Chloride: 101 meq/L (ref 96–112)
Creatinine, Ser: 1.08 mg/dL (ref 0.40–1.20)
GFR: 63.25 mL/min (ref >60.00)
Glucose, Bld: 101 mg/dL — ABNORMAL HIGH (ref 70–99)
Potassium: 4.1 meq/L (ref 3.5–5.1)
Sodium: 139 meq/L (ref 135–145)
Total Bilirubin: 0.4 mg/dL (ref 0.2–1.2)
Total Protein: 7.9 g/dL (ref 6.0–8.3)

## 2024-01-14 LAB — TSH: TSH: 1.09 u[IU]/mL (ref 0.35–5.50)

## 2024-01-14 LAB — VITAMIN D 25 HYDROXY (VIT D DEFICIENCY, FRACTURES): VITD: 35.86 ng/mL (ref 30.00–100.00)

## 2024-01-14 LAB — HEMOGLOBIN A1C: Hgb A1c MFr Bld: 5.7 % (ref 4.6–6.5)

## 2024-01-14 MED ORDER — EPINEPHRINE 0.3 MG/0.3ML IJ SOAJ
0.3000 mg | INTRAMUSCULAR | 0 refills | Status: DC | PRN
  Filled 2024-01-14: qty 2, 15d supply, fill #0

## 2024-01-14 MED ORDER — NEBIVOLOL HCL 5 MG PO TABS
5.0000 mg | ORAL_TABLET | Freq: Every day | ORAL | 3 refills | Status: AC
Start: 2024-01-14 — End: ?
  Filled 2024-01-14: qty 30, 30d supply, fill #0
  Filled 2024-01-26 – 2024-02-23 (×2): qty 30, 30d supply, fill #1
  Filled 2024-03-23: qty 30, 30d supply, fill #2
  Filled 2024-04-23: qty 30, 30d supply, fill #3
  Filled 2024-05-21: qty 30, 30d supply, fill #4
  Filled 2024-06-18: qty 30, 30d supply, fill #5

## 2024-01-14 MED ORDER — BUPROPION HCL ER (XL) 300 MG PO TB24
300.0000 mg | ORAL_TABLET | Freq: Every day | ORAL | 1 refills | Status: AC
  Filled 2024-01-14: qty 30, 30d supply, fill #0
  Filled 2024-01-26 – 2024-02-23 (×2): qty 30, 30d supply, fill #1
  Filled 2024-03-23: qty 30, 30d supply, fill #2
  Filled 2024-04-23: qty 30, 30d supply, fill #3
  Filled 2024-05-21: qty 30, 30d supply, fill #4
  Filled 2024-06-18: qty 30, 30d supply, fill #5

## 2024-01-14 NOTE — Assessment & Plan Note (Signed)
Chronic Check vitamin D level 

## 2024-01-14 NOTE — Assessment & Plan Note (Signed)
 Chronic Lab Results  Component Value Date   HGBA1C 5.7 01/14/2024   A1c today Low sugar / carb diet Stressed regular exercise

## 2024-01-14 NOTE — Assessment & Plan Note (Signed)
 Chronic Mild, intermittent Controlled Continue Symbicort  twice daily as needed and continue albuterol  as needed Continue Singulair  10 mg nightly

## 2024-01-14 NOTE — Assessment & Plan Note (Signed)
 Chronic  working with [facility] Has lost some weight Taking phentermine  8 mg bid,  topamax  50 mg bid

## 2024-01-14 NOTE — Assessment & Plan Note (Addendum)
 Chronic Blood pressure not controlled Continue Benicar  HCT 20-12.5 mg daily Increase nebivolol   to 5 mg dail,

## 2024-01-14 NOTE — Assessment & Plan Note (Addendum)
 Chronic Not ideally controlled Increased depression, anxiety is ok Increase  bupropion  XL to 300 mg daily Referral to therapy

## 2024-01-14 NOTE — Assessment & Plan Note (Signed)
 Chronic B/l leg edema with chronic skin thickening Working on weight loss Limited in exercise due to joint pain

## 2024-01-15 DIAGNOSIS — G4733 Obstructive sleep apnea (adult) (pediatric): Secondary | ICD-10-CM | POA: Diagnosis not present

## 2024-01-15 DIAGNOSIS — I10 Essential (primary) hypertension: Secondary | ICD-10-CM | POA: Diagnosis not present

## 2024-01-19 ENCOUNTER — Emergency Department (HOSPITAL_BASED_OUTPATIENT_CLINIC_OR_DEPARTMENT_OTHER): Admission: EM | Admit: 2024-01-19 | Discharge: 2024-01-19 | Disposition: A | Discharge status: Home / Self Care

## 2024-01-19 ENCOUNTER — Other Ambulatory Visit: Payer: Self-pay

## 2024-01-19 DIAGNOSIS — T7840XA Allergy, unspecified, initial encounter: Secondary | ICD-10-CM | POA: Diagnosis not present

## 2024-01-19 DIAGNOSIS — L299 Pruritus, unspecified: Secondary | ICD-10-CM | POA: Insufficient documentation

## 2024-01-19 MED ORDER — FAMOTIDINE 20 MG PO TABS: 20.0000 mg | ORAL_TABLET | Freq: Every day | ORAL | 0 refills | Status: AC

## 2024-01-19 MED ORDER — EPINEPHRINE 0.3 MG/0.3ML IJ SOAJ
0.3000 mg | INTRAMUSCULAR | 0 refills | Status: AC | PRN
  Filled 2024-01-23 – 2024-01-28 (×2): qty 2, 30d supply, fill #0

## 2024-01-19 MED ORDER — PREDNISONE 10 MG PO TABS
20.0000 mg | ORAL_TABLET | Freq: Every day | ORAL | 0 refills | Status: DC
  Filled 2024-01-23: qty 15, 7d supply, fill #0

## 2024-01-19 MED ORDER — PREDNISONE 50 MG PO TABS
60.0000 mg | ORAL_TABLET | Freq: Once | ORAL | Status: AC
  Administered 2024-01-19: 60 mg via ORAL
  Filled 2024-01-19: qty 1

## 2024-01-19 MED ORDER — FAMOTIDINE 20 MG PO TABS
40.0000 mg | ORAL_TABLET | Freq: Once | ORAL | Status: AC
  Administered 2024-01-19: 40 mg via ORAL
  Filled 2024-01-19: qty 2

## 2024-01-19 NOTE — Discharge Instructions (Signed)
 Take prednisone  as discussed below Decrease dose by 1 tablet each day he will start tomorrow with 5 tablets  Take Benadryl  as needed for itching up to every 6 hours.  Pepcid  once daily.

## 2024-01-19 NOTE — ED Provider Notes (Signed)
 [place] EMERGENCY DEPARTMENT AT [facility] Provider Note   CSN: [id number] Arrival date & time: 01/19/24  1608     Patient presents with: Allergic Reaction   Marissa Joseph is a 43 y.o. female.    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  She states she was not exposed any shellfish today.  Today around 3 PM she started having itching in her chest and torso and feels that she has some puffiness in her eyes administered an EpiPen  but did not notice any significant improvement in her symptoms then took 50 mg Benadryl  around 3:20 PM.  Denies any difficulty breathing lightness or dizziness.  No nausea vomiting diarrhea or abdominal pain.       Prior to Admission medications   Medication Sig Start Date End Date Taking? Authorizing Provider  EPINEPHrine  0.3 mg/0.3 mL IJ SOAJ injection Inject 0.3 mg into the muscle as needed for anaphylaxis. 01/19/24  Yes Sawyer Mentzer S, PA  famotidine  (PEPCID ) 20 MG tablet Take 1 tablet (20 mg total) by mouth daily. 01/19/24  Yes Brion Hedges S, PA  predniSONE  (DELTASONE ) 10 MG tablet Take 2 tablets (20 mg total) by mouth daily. 01/19/24  Yes Joene Gelder, Hamp RAMAN, PA  albuterol  (VENTOLIN  HFA) 108 (90 Base) MCG/ACT inhaler Inhale 1-2 puffs into the lungs every 6 (six) hours as needed for wheezing 10/26/23   Geofm Glade PARAS, MD  ALPRAZolam  (XANAX ) 0.5 MG tablet Take 1 tablet (0.5 mg total) by mouth at bedtime as needed for anxiety or sleep (for sleep test use). 10/05/23   Dohmeier, Dedra, MD  budesonide -formoterol  (SYMBICORT ) 80-4.5 MCG/ACT inhaler Inhale 2 puffs into the lungs 2 (two) times daily. 01/12/23   Geofm Glade PARAS, MD  buPROPion  (WELLBUTRIN  XL) 300 MG 24 hr tablet Take 1 tablet (300 mg total) by mouth daily. 01/14/24   Geofm Glade PARAS, MD  diclofenac  Sodium (VOLTAREN ) 1 % GEL Apply 4 g topically 4 (four) times daily as needed. 07/17/19   Lamptey,  Aleene KIDD, MD  docusate sodium  (COLACE) 100 MG capsule Take 1 capsule (100 mg total) by mouth 2 (two) times daily. 10/02/23   Bowen, Darice BRAVO, DO  fluconazole  (DIFLUCAN ) 150 MG tablet Take 1 tablet by mouth once every 3 days as needed 10/11/23   Norleen Lynwood ORN, MD  furosemide  (LASIX ) 20 MG tablet Take 1 tablet (20 mg total) by mouth daily. 07/14/22   Geofm Glade PARAS, MD  gabapentin  (NEURONTIN ) 100 MG capsule Take 2 capsules (200 mg total) by mouth at bedtime. 01/12/23   Geofm Glade PARAS, MD  indomethacin  (INDOCIN ) 50 MG capsule TAKE 1 CAPSULE BY MOUTH THREE TIMES DAILY WITH MEALS 11/10/20   [provider]  lidocaine  (LIDODERM ) 5 % Place 1 patch onto the skin daily. Remove & Discard patch within 12 hours or as directed by MD 03/05/22   Jerrol Lynwood, MD  medroxyPROGESTERone  (PROVERA ) 10 MG tablet Take 1 tablet (10 mg total) by mouth daily. Take for days to bring on menses 11/16/23   Ajewole, Christana, MD  montelukast  (SINGULAIR ) 10 MG tablet Take 1 tablet (10 mg total) by mouth at bedtime. 11/25/21   Geofm Glade PARAS, MD  nebivolol  (BYSTOLIC ) 5 MG tablet Take 1 tablet (5 mg total) by mouth daily. 01/14/24   Geofm Glade PARAS, MD  olmesartan -hydrochlorothiazide  (BENICAR  HCT) 20-12.5 MG tablet Take 1 tablet by mouth daily. 07/20/23   Geofm Glade PARAS, MD  Phentermine  HCl (  LOMAIRA ) 8 MG TABS Take 1 tablet (8 mg total) by mouth 2 (two) times daily. Take 30 minutes before first meal of the day and 1 hour after 2nd meal of the day. 12/10/23   Delores Shields A, DO  polyethylene glycol powder (GLYCOLAX /MIRALAX ) 17 GM/SCOOP powder Take 17 g by mouth 2 (two) times daily as needed for severe constipation. 10/02/23   Bowen, Darice BRAVO, DO  topiramate  (TOPAMAX ) 50 MG tablet Take 1 tablet (50 mg total) by mouth 2 (two) times daily. 01/08/24   Bowen, Darice BRAVO, DO  triamcinolone  cream (KENALOG ) 0.1 % Apply 1 Application topically 2 (two) times daily. 01/12/23   Geofm Glade PARAS, MD    Allergies: Avocado and Shellfish allergy    Review of  Systems  Updated Vital Signs BP 101/63   Pulse 73   Temp 98.2 F (36.8 C) (Axillary)   Resp 16   LMP 09/19/2023 (Approximate)   SpO2 97%   Physical Exam Vitals and nursing note reviewed.  Constitutional:      General: She is not in acute distress.    Appearance: She is obese.     Comments: Patient resting comfortably/asleep in bed on my arrival to room.  Awakens to verbal stimuli.  Alert and oriented and able to answer questions appropriately   HENT:     Head: Normocephalic and atraumatic.     Nose: Nose normal.     Mouth/Throat:     Mouth: Mucous membranes are moist.  Eyes:     General: No scleral icterus. Cardiovascular:     Rate and Rhythm: Normal rate and regular rhythm.     Pulses: Normal pulses.     Heart sounds: Normal heart sounds.  Pulmonary:     Effort: Pulmonary effort is normal. No respiratory distress.     Breath sounds: No wheezing.     Comments: Lungs clear, no wheezing, no tachypnea  Abdominal:     Palpations: Abdomen is soft.     Tenderness: There is no abdominal tenderness.  Musculoskeletal:     Cervical back: Normal range of motion.     Right lower leg: No edema.     Left lower leg: No edema.  Skin:    General: Skin is warm and dry.     Capillary Refill: Capillary refill takes less than 2 seconds.     Comments: Excoriations to rash on chest   Neurological:     Mental Status: She is alert. Mental status is at baseline.  Psychiatric:        Mood and Affect: Mood normal.        Behavior: Behavior normal.     (all labs ordered are listed, but only abnormal results are displayed) Labs Reviewed - No data to display  EKG: None  Radiology: No results found.   Procedures   Medications Ordered in the ED  predniSONE  (DELTASONE ) tablet 60 mg (60 mg Oral Given 01/19/24 1655)  famotidine  (PEPCID ) tablet 40 mg (40 mg Oral Given 01/19/24 1655)    Clinical Course as of 01/19/24 1849  Sat Jan 19, 2024  1647 3:20pm [WF]    Clinical Course User  Index [WF] Neldon Hamp RAMAN, [place]                                 Medical Decision Making Risk Prescription drug management.   Patient is a 43 year old female with a past medical history significant for anaphylactic  reaction to shellfish.  She states she was not exposed any shellfish today.  Today around 3 PM she started having itching in her chest and torso and feels that she has some puffiness in her eyes administered an EpiPen  but did not notice any significant improvement in her symptoms then took 50 mg Benadryl  around 3:20 PM.  Denies any difficulty breathing lightness or dizziness.  No nausea vomiting diarrhea or abdominal pain.   Patient here primarily for itching.  She experienced what I suspect was not a anaphylactic reaction early but rather a cutaneous allergic reaction.  Monitored here for 3 hours.  Received Pepcid  prednisone  and will discharge home with prednisone  taper and recommendation is to take daily Pepcid  for 1 week and as needed Benadryl  follow-up with allergist and use EpiPen  if needed.  I also prescribed the patient a replacement EpiPen .  Patient is ambulatory, feeling well, normal vital signs at time of discharge return precautions discussed she is agreeable to plan.   Final diagnoses:  Itching    ED Discharge Orders          Ordered    famotidine  (PEPCID ) 20 MG tablet  Daily        01/19/24 1847    predniSONE  (DELTASONE ) 10 MG tablet  Daily        01/19/24 1847    EPINEPHrine  0.3 mg/0.3 mL IJ SOAJ injection  As needed        01/19/24 1849               Neldon Hamp RAMAN, [place] 01/19/24 1849    Levander Houston, MD 01/20/24 1620

## 2024-01-19 NOTE — ED Triage Notes (Signed)
 Pt caox4, ambulatory c/o allergic reaction which started approx around 3p when she had sudden onset of itching on torso and swelling around the eyes. Pt states she used her epi-pen and took benadryl  around 320p but itching has worsened. Denies SOB and N/V/D.

## 2024-01-23 ENCOUNTER — Other Ambulatory Visit (HOSPITAL_BASED_OUTPATIENT_CLINIC_OR_DEPARTMENT_OTHER): Payer: Self-pay

## 2024-01-26 ENCOUNTER — Other Ambulatory Visit: Payer: Self-pay | Admitting: Internal Medicine

## 2024-01-26 ENCOUNTER — Other Ambulatory Visit (HOSPITAL_BASED_OUTPATIENT_CLINIC_OR_DEPARTMENT_OTHER): Payer: Self-pay

## 2024-01-28 ENCOUNTER — Encounter: Payer: Self-pay | Admitting: Family Medicine

## 2024-01-28 ENCOUNTER — Other Ambulatory Visit (HOSPITAL_BASED_OUTPATIENT_CLINIC_OR_DEPARTMENT_OTHER): Payer: Self-pay

## 2024-01-28 ENCOUNTER — Other Ambulatory Visit: Payer: Self-pay

## 2024-01-28 ENCOUNTER — Ambulatory Visit (INDEPENDENT_AMBULATORY_CARE_PROVIDER_SITE_OTHER): Admitting: Family Medicine

## 2024-01-28 VITALS — BP 127/82 | HR 68 | Temp 98.6°F | Ht 64.0 in | Wt 363.0 lb

## 2024-01-28 DIAGNOSIS — E66813 Obesity, class 3: Secondary | ICD-10-CM

## 2024-01-28 DIAGNOSIS — F32A Depression, unspecified: Secondary | ICD-10-CM

## 2024-01-28 DIAGNOSIS — M25562 Pain in left knee: Secondary | ICD-10-CM | POA: Diagnosis not present

## 2024-01-28 DIAGNOSIS — G8929 Other chronic pain: Secondary | ICD-10-CM | POA: Diagnosis not present

## 2024-01-28 DIAGNOSIS — Z7289 Other problems related to lifestyle: Secondary | ICD-10-CM | POA: Diagnosis not present

## 2024-01-28 DIAGNOSIS — F419 Anxiety disorder, unspecified: Secondary | ICD-10-CM | POA: Diagnosis not present

## 2024-01-28 DIAGNOSIS — R632 Polyphagia: Secondary | ICD-10-CM

## 2024-01-28 DIAGNOSIS — M25561 Pain in right knee: Secondary | ICD-10-CM

## 2024-01-28 DIAGNOSIS — Z6841 Body Mass Index (BMI) 40.0 and over, adult: Secondary | ICD-10-CM

## 2024-01-28 DIAGNOSIS — G4733 Obstructive sleep apnea (adult) (pediatric): Secondary | ICD-10-CM | POA: Diagnosis not present

## 2024-01-28 DIAGNOSIS — Z9189 Other specified personal risk factors, not elsewhere classified: Secondary | ICD-10-CM

## 2024-01-28 MED ORDER — OLMESARTAN MEDOXOMIL-HCTZ 20-12.5 MG PO TABS
1.0000 | ORAL_TABLET | Freq: Every day | ORAL | 1 refills | Status: AC
  Filled 2024-01-28: qty 30, 30d supply, fill #0
  Filled 2024-03-03: qty 30, 30d supply, fill #1
  Filled 2024-04-02: qty 30, 30d supply, fill #2
  Filled 2024-05-02: qty 30, 30d supply, fill #3
  Filled 2024-06-01: qty 30, 30d supply, fill #4
  Filled 2024-06-28: qty 30, 30d supply, fill #5

## 2024-01-28 MED ORDER — TOPIRAMATE 50 MG PO TABS
50.0000 mg | ORAL_TABLET | Freq: Two times a day (BID) | ORAL | 0 refills | Status: DC
Start: 2024-01-28 — End: 2024-02-20
  Filled 2024-01-28: qty 60, 30d supply, fill #0

## 2024-01-28 MED ORDER — GABAPENTIN 100 MG PO CAPS
200.0000 mg | ORAL_CAPSULE | Freq: Every day | ORAL | 5 refills | Status: AC
  Filled 2024-01-28: qty 60, 30d supply, fill #0
  Filled 2024-02-23: qty 60, 30d supply, fill #1
  Filled 2024-04-23: qty 60, 30d supply, fill #2

## 2024-01-28 NOTE — Progress Notes (Signed)
 Office: [phone]  /  Fax: [phone]  WEIGHT SUMMARY AND BIOMETRICS  Starting Date: 03/08/23  Starting Weight: 369lb   Weight Lost Since Last Visit: 2lb   Vitals Temp: 98.6 F (37 C) BP: 127/82 Pulse Rate: 68 SpO2: 99 %   Body Composition  Body Fat %: 61.9 % Fat Mass (lbs): 225 lbs Muscle Mass (lbs): 131.4 lbs Visceral Fat Rating : 27     HPI  Chief Complaint: OBESITY  Marissa Joseph is here to discuss her progress with her obesity treatment plan. She is on the the Category 3 Plan and states she is following her eating plan approximately 50 % of the time. She states she is exercising 30 minutes 2 times per week.   Interval History:  Since last office visit she is down 2 lb She is up 1 pound of muscle mass and down 3.2 pounds of body fat since last visit This gives her a net weight loss of 6 lb in 10 mos of medically supervised weight management She started wearing her BipPap at night and energy level is improving She has been more active helping her sister with her triplet babies She has been taking Lomaira  8 mg twice daily and topiramate  50 mg twice daily but is forgetting to take the second dose of both medications. She is mindful of lean protein intake She isn't skipping meals as often She is getting in some fruits and veggies She is struggling more with depressed mood She was referred by Dr Geofm to behavioral health and physical therapy Her depression has been a barrier for progress over the last 10 months  Pharmacotherapy: None Insurance does not cover use of a GLP-1 receptor agonist  PHYSICAL EXAM:  Blood pressure 127/82, pulse 68, temperature 98.6 F (37 C), height 5' 4 (1.626 m), weight (!) 363 lb (164.7 kg), SpO2 99%. Body mass index is 62.31 kg/m.  General: She is overweight, cooperative, alert, well developed, and in no acute distress. PSYCH: Has normal mood, affect and thought process.   Lungs: Normal breathing effort, no conversational  dyspnea.  ASSESSMENT AND PLAN  TREATMENT PLAN FOR OBESITY:  Recommended Dietary Goals  [name] is currently in the action stage of change. As such, her goal is to continue weight management plan. She has agreed to keeping a food journal and adhering to recommended goals of 1600 calories and 100+ grams of protein. We discussed using AI for new meal plans with 1600-calorie healthy diet for tracking daily intake using the my net diary app  Behavioral Intervention  We discussed the following Behavioral Modification Strategies today: increasing lean protein intake to established goals, increasing fiber rich foods, increasing water intake , work on meal planning and preparation, work on tracking and journaling calories using tracking application, keeping healthy foods at home, practice mindfulness eating and understand the difference between hunger signals and cravings, work on managing stress, creating time for self-care and relaxation, avoiding temptations and identifying enticing environmental cues, continue to work on maintaining a reduced calorie state, getting the recommended amount of protein, incorporating whole foods, making healthy choices, staying well hydrated and practicing mindfulness when eating., and increase protein intake, fibrous foods (25 grams per day for women, 30 grams for men) and water to improve satiety and decrease hunger signals. .  Additional resources provided today: NA  Recommended Physical Activity Goals  Lasharn has been advised to work up to 150 minutes of moderate intensity aerobic activity a week and strengthening exercises 2-3 times per week for  cardiovascular health, weight loss maintenance and preservation of muscle mass.   She has agreed to Think about enjoyable ways to increase daily physical activity and overcoming barriers to exercise and Increase physical activity in their day and reduce sedentary time (increase NEAT). Bilateral knee pain continues to limit  exercise.  She is scheduled for physical therapy at [facility] and has a scheduled gym membership.  We discussed the addition of water exercise in the future.  Pharmacotherapy changes for the treatment of obesity: Discontinue Lomaira   ASSOCIATED CONDITIONS ADDRESSED TODAY  Sedentary lifestyle Unchanged.  Bilateral knee DJD, sedentary job and depression have been barriers for her increased physical activity levels.  Polyphagia She sees some improvement in food impulse control on topiramate .  Will continue topiramate  50 mg twice daily, moving the second dose to dinnertime to help improve her compliance. -     Topiramate ; Take 1 tablet (50 mg total) by mouth 2 (two) times daily.  Dispense: 60 tablet; Refill: 0  Class 3 severe obesity due to excess calories with serious comorbidity and body mass index (BMI) of 60.0 to 69.9 in adult Patient's overall weight loss has been less than expected over the past 10 months of medically supervised weight management.  She is contributing factor struggles low progress including depressed mood, bilateral knee pain, sedentary job and lack of good support system.  Consider the role of bariatric surgery though she has declined this option in the past.  Consider working on prior authorization process for Zepbound  using her OSA diagnosis.  Anxiety and depression She is scheduled to see behavioral health currently on Wellbutrin  XL 300 mg once daily She has some associated emotional eating.  We discussed the importance of treating mood and weight reduction.  Continue current medications and keep upcoming visit with behavioral health.  Chronic pain of both knees Unchanged.  Her weight has contributed to bilateral knee pain.  This has in turn has limited her walking ability.  She is scheduled to see PT.  She has a [employer] and we discussed adding in water exercise.  OSA treated with BiPAP She has improved her compliance using BiPAP at night.  Overall sleep has  improved.  Aim for 7 to 8 hours of sleep using BiPAP nightly.    She was informed of the importance of frequent follow up visits to maximize her success with intensive lifestyle modifications for her multiple health conditions.   ATTESTASTION STATEMENTS:  Reviewed by clinician on day of visit: allergies, medications, problem list, medical history, surgical history, family history, social history, and previous encounter notes pertinent to obesity diagnosis.   I have personally spent 32 minutes total time today in preparation, patient care, nutritional counseling and education,  and documentation for this visit, including the following: review of most recent clinical lab tests, prescribing medications/ refilling medications, reviewing medical assistant documentation, review and interpretation of bioimpedence results.     Darice Haddock, D.O. DABFM, DABOM [facility] [address] [place], [place] [address] [phone]

## 2024-01-28 NOTE — Patient Instructions (Addendum)
 Stop Lomaira   Continue Topiramate  50 mg tab- Take one tab in the morning and one tab with dinner daily   Great job wearing BiPap at night  Keep upcoming visits with behavioral health and physical therapy  Work on stress reduction, sleep, self care, grocery planning  You have the option to use Chat GPT app Ask '1600 cal healthy meal plan' OR you can track intake on the MyNetDiary with a goal of 1600 cal/ day with 100+ g of protein daily  Hydrate well with water

## 2024-01-30 DIAGNOSIS — I10 Essential (primary) hypertension: Secondary | ICD-10-CM | POA: Diagnosis not present

## 2024-01-30 DIAGNOSIS — G4733 Obstructive sleep apnea (adult) (pediatric): Secondary | ICD-10-CM | POA: Diagnosis not present

## 2024-02-13 ENCOUNTER — Ambulatory Visit (INDEPENDENT_AMBULATORY_CARE_PROVIDER_SITE_OTHER): Admitting: Licensed Clinical Social Worker

## 2024-02-13 DIAGNOSIS — F331 Major depressive disorder, recurrent, moderate: Secondary | ICD-10-CM | POA: Diagnosis not present

## 2024-02-13 NOTE — Progress Notes (Signed)
 [facility] Counselor/Therapist Progress Note  Patient ID: Marissa Joseph, MRN: [id number]    Date: 02/13/24  Time Spent: 0905  am - 1001 am : 56 Minutes  Treatment Type: Initial Assessment and Treatment Planning  Presenting Problem Chief Complaint: Patient reporting a lot of depression and grief. Patient reports losing her grandmother and great grandmother that she was close to. She lost cousin that was like a brother and another cousin that was like a big sister and an aunt that was like a second Mom. Things started in 2004 when she lost her job. Patient reports trauma since childhood of being bullied.   What are the main stressors in your life right now, how long? Depression  3, Anxiety   3, Mood Swings  3, Appetite Change   3, Sleep Changes   3, Hallucinations  3, Work Problems   3, Racing Thoughts   3, Confusion   3, Memory Problems   3, Loss of Interest   3, Irritability   3, Excessive Worrying   3, Low Energy   3, Obsessive Thoughts   3, Change in Sexual Interest   3, and Poor Concentration   3   Previous mental health services Have you ever been treated for a mental health problem, when, where, by whom? No     Are you currently seeing a therapist or counselor, counselor's name? No   Have you ever had a mental health hospitalization, how many times, length of stay? No   Have you ever been treated with medication, name, reason, response? Yes Wellbutrin  for depression about a year ago from PCP.  Have you ever had suicidal thoughts or attempted suicide, when, how? No NA  Risk factors for Suicide Demographic factors:  Low socioeconomic status and Living alone Current mental status: No plan to harm self or others Loss factors: Loss of significant relationship and Financial problems/change in socioeconomic status Historical factors: NA Risk Reduction factors: Employed Clinical factors:  Severe Anxiety and/or Agitation Depression:   Moderate Chronic Pain Medical  Diagnoses and Treatments/Surgeries Cognitive features that contribute to risk: NA    SUICIDE RISK:  Minimal: No identifiable suicidal ideation.  Patients presenting with no risk factors but with morbid ruminations; may be classified as minimal risk based on the severity of the depressive symptoms  Medical history Medical treatment and/or problems, explain: Yes  Chronic venous insufficiency of lower extremity             Essential hypertension           Respiratory     Allergic rhinitis            Asthma            Chronic intermittent hypoxia with obstructive sleep apnea            Class 3 obesity with alveolar hypoventilation, serious comorbidity, and body mass index (BMI) of 60.0 to 69.9 in adult [facility])           Musculoskeletal and Integument     Eczema            Monilial intertrigo            Osteoarthritis of left knee            Derangement of right knee            Acute lateral meniscus tear of right knee            Acute medial meniscus tear of right  knee            Displacement of cervical intervertebral disc           Genitourinary     Fibroid uterus            Dysmenorrhea           Other     Class 3 severe obesity due to excess calories with serious comorbidity and body mass index (BMI) of 60.0 to 69.9 in adult            Pap smear of cervix with ASCUS, cannot exclude HGSIL            Smoking            Prediabetes            Hearing difficulty            Ventral hernia without obstruction or gangrene            Amenorrhea            Adjustment disorder with mixed anxiety and depressed mood            Chronic back pain            Scapular dyskinesis            Morbid obesity with BMI of 60.0-69.9, adult (HCC)            Cellulitis of left lower extremity            Constipation            Lymphedema of both lower extremities            Acute right ankle pain            Palpitations             Chronic pain syndrome            Right leg pain            Mixed stress and urge incontinence            Chronic pelvic pain in female            Positive RPR test            Vitamin D  deficiency            SOBOE (shortness of breath on exertion)            Other fatigue            Polyphagia            Sedentary lifestyle            Sleep disorder, shift-work            Nicotine dependence, other tobacco product, uncomplicated            Left leg cellulitis            Obesity hypoventilation syndrome (HCC)            Acute pain of left knee            BMI 60.0-69.9, adult (HCC)         Do you have any issues with chronic pain?  Yes Back and legs, knees, ankle no cartilage   Name of primary care physician/last physical exam: Dr. Glade Sabina  Allergies: Yes Medication, reactions? Shellfish and Avocado   Current medications:  triamcinolone  cream (KENALOG ) 0.1 % 1 Application, 2 times daily  Taking Taking Differently Not Taking Unknown           topiramate  (TOPAMAX ) 50 MG tablet 50 mg, 2 times daily Taking Taking Differently Not Taking Unknown          predniSONE  (DELTASONE ) 10 MG tablet 20 mg, Daily Taking Taking Differently Not Taking Unknown          polyethylene glycol powder (GLYCOLAX /MIRALAX ) 17 GM/SCOOP powder 17 g, 2 times daily PRN Taking as Needed Taking Differently Not Taking Unknown          olmesartan -hydrochlorothiazide  (BENICAR  HCT) 20-12.5 MG tablet 1 tablet, Daily Taking Taking Differently Not Taking Unknown          nebivolol  (BYSTOLIC ) 5 MG tablet 5 mg, Daily Taking Taking Differently Not Taking Unknown          montelukast  (SINGULAIR ) 10 MG tablet 10 mg, Daily at bedtime Taking Taking Differently Not Taking Unknown          medroxyPROGESTERone  (PROVERA ) 10 MG tablet 10 mg, Daily Taking Taking Differently Not Taking Unknown          lidocaine  (LIDODERM ) 5 % 1 patch, Every 24 hours Taking Taking  Differently Not Taking Unknown          indomethacin  (INDOCIN ) 50 MG capsule  Taking Taking Differently Not Taking Unknown          gabapentin  (NEURONTIN ) 100 MG capsule 200 mg, Daily at bedtime Taking Taking Differently Not Taking Unknown          furosemide  (LASIX ) 20 MG tablet 20 mg, Daily Taking Taking Differently Not Taking Unknown       Order Note (08/17/2022): As needed      fluconazole  (DIFLUCAN ) 150 MG tablet  Taking Taking Differently Not Taking Unknown          famotidine  (PEPCID ) 20 MG tablet 20 mg, Daily Taking Taking Differently Not Taking Unknown          EPINEPHrine  0.3 mg/0.3 mL IJ SOAJ injection 0.3 mg, As needed Taking as Needed Taking Differently Not Taking Unknown          docusate sodium  (COLACE) 100 MG capsule 100 mg, 2 times daily Taking Taking Differently Not Taking Unknown          diclofenac  Sodium (VOLTAREN ) 1 % GEL 4 g, 4 times daily PRN Taking as Needed Taking Differently Not Taking Unknown          buPROPion  (WELLBUTRIN  XL) 300 MG 24 hr tablet 300 mg, Daily Taking Taking Differently Not Taking Unknown          budesonide -formoterol  (SYMBICORT ) 80-4.5 MCG/ACT inhaler 2 puff, 2 times daily Taking Taking Differently Not Taking Unknown          ALPRAZolam  (XANAX ) 0.5 MG tablet 0.5 mg, At bedtime PRN Taking as Needed Taking Differently Not Taking Unknown          albuterol  (VENTOLIN  HFA) 108 (90 Base) MCG/ACT inhaler 1-2 puff, Every 6          Prescribed by: Glade Hope  Is there any history of mental health problems or substance abuse in your family, whom? Yes, mental health-First cousin-Patient unable to recall her diagnosis  Has anyone in your family been hospitalized, who, where, length of stay? Yes, Cousin, unable to recall location and dates  Social/family history Have you been married, how many times?  0  Do you have children?  0  How many pregnancies have you had?  0  Who lives in your current  household?  Currently Lives alone  Military history: No NA  Religious/spiritual involvement: NA What religion/faith base are you? Christian  Family of origin (childhood history)  Mother, step father and 2 brothers and 2 sisters and patient.  Where were you born? [place] Where did you grow up? [place]  How many different homes have you lived? 12  Describe the atmosphere of the household where you grew up: Loving and stable, patient reports that was her happy  time  Do you have siblings, step/half siblings, list names, relation, sex, age? Yes Quincy-38, Quimane-35, Jaquan-33, Porter  Are your parents separated/divorced, when and why? Yes, separated when patient was 43 years old. Are your parents alive? Yes Both parents are still living  Social supports (personal and professional): Sister and mother  Education How many grades have you completed? some college  Did you have any problems in school, what type? Yes Problem with focus Medications prescribed for these problems? 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   Legal history:NA  Trauma/Abuse history: Have you ever been exposed to any form of abuse, what type? Yes emotional, patient reports being talked down to, belittled in relationships and during her school years.  Have you ever been exposed to something traumatic, describe? Yes Found cousin dead, it was reported that he went into a diabetic  coma on Oct 20, 2013.  Substance use Do you use Caffeine? Yes Type, frequency? Patient report 2 cups of coffee daily, no more than 5.  Do you use Nicotine? Yes Type, frequency, ppd? Vape THC   Do you use Alcohol? Yes Type, frequency? On Ocassion , on the weeken may take a shot or two of tequila   How old were you went you first tasted alcohol? 16   Was this accepted by your family? No, family didn't know.  When was your last drink,  type, how much? Saturday last weekend.  Have you ever used illicit drugs or taken more than prescribed, type, frequency, date of last usage? No THC use only via vaping.  Mental Status: General Appearance Marissa Joseph:  Casual Eye Contact:  Fair Motor Behavior:  Normal Speech:  Normal Level of Consciousness:  Alert Mood:  Depressed Affect:  Depressed Anxiety Level:  Moderate Thought Process:  Coherent Thought Content:  WNL Perception:  Normal Judgment:  Good Insight:  Present Cognition:  Orientation time, place, and person  Diagnosis AXIS I Major Depressive Disorder, recurrent, moderate  AXIS II No Diagnosis  AXIS III Multiple Health Issues  AXIS IV Social and economic  AXIS V Moderate   Patient reports she wants to buy a home and live comfortably without the help of others. Patient desires to work on her depression and anxiety.   Risk Assessment: Danger to Self:  No Self-injurious Behavior: No Danger to Others: No Duty to Warn:no Physical Aggression / Violence:No  Access to Firearms a concern: No  Gang Involvement:No   Subjective:   Dietrich CROME Cayton participated in person from office, located at [employer]. Janisa consented to treatment. Therapist participated from office located at [facility].   Interventions: Cognitive Behavioral Therapy  Diagnosis: Major Depressive Disorder, recurrent moderate   Damien Junk MSW, LCSW/DATE 02/13/2024

## 2024-02-15 ENCOUNTER — Ambulatory Visit (HOSPITAL_BASED_OUTPATIENT_CLINIC_OR_DEPARTMENT_OTHER): Attending: Internal Medicine | Admitting: Physical Therapy

## 2024-02-15 ENCOUNTER — Other Ambulatory Visit: Payer: Self-pay

## 2024-02-15 ENCOUNTER — Encounter (HOSPITAL_BASED_OUTPATIENT_CLINIC_OR_DEPARTMENT_OTHER): Payer: Self-pay | Admitting: Physical Therapy

## 2024-02-15 DIAGNOSIS — R293 Abnormal posture: Secondary | ICD-10-CM | POA: Diagnosis not present

## 2024-02-15 DIAGNOSIS — M25561 Pain in right knee: Secondary | ICD-10-CM | POA: Diagnosis not present

## 2024-02-15 DIAGNOSIS — G8929 Other chronic pain: Secondary | ICD-10-CM | POA: Diagnosis not present

## 2024-02-15 DIAGNOSIS — G4733 Obstructive sleep apnea (adult) (pediatric): Secondary | ICD-10-CM | POA: Diagnosis not present

## 2024-02-15 DIAGNOSIS — M25562 Pain in left knee: Secondary | ICD-10-CM | POA: Diagnosis not present

## 2024-02-15 DIAGNOSIS — M549 Dorsalgia, unspecified: Secondary | ICD-10-CM | POA: Insufficient documentation

## 2024-02-15 DIAGNOSIS — R262 Difficulty in walking, not elsewhere classified: Secondary | ICD-10-CM | POA: Diagnosis not present

## 2024-02-15 DIAGNOSIS — M6281 Muscle weakness (generalized): Secondary | ICD-10-CM | POA: Diagnosis not present

## 2024-02-15 DIAGNOSIS — I10 Essential (primary) hypertension: Secondary | ICD-10-CM | POA: Diagnosis not present

## 2024-02-15 DIAGNOSIS — R279 Unspecified lack of coordination: Secondary | ICD-10-CM | POA: Diagnosis not present

## 2024-02-15 DIAGNOSIS — M25571 Pain in right ankle and joints of right foot: Secondary | ICD-10-CM | POA: Diagnosis not present

## 2024-02-15 DIAGNOSIS — M5459 Other low back pain: Secondary | ICD-10-CM | POA: Diagnosis not present
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
## 2024-02-19 ENCOUNTER — Encounter (HOSPITAL_BASED_OUTPATIENT_CLINIC_OR_DEPARTMENT_OTHER): Payer: Self-pay | Admitting: Physical Therapy

## 2024-02-19 ENCOUNTER — Ambulatory Visit (HOSPITAL_BASED_OUTPATIENT_CLINIC_OR_DEPARTMENT_OTHER): Admitting: Physical Therapy

## 2024-02-19 DIAGNOSIS — R279 Unspecified lack of coordination: Secondary | ICD-10-CM

## 2024-02-19 DIAGNOSIS — M6281 Muscle weakness (generalized): Secondary | ICD-10-CM

## 2024-02-19 DIAGNOSIS — M549 Dorsalgia, unspecified: Secondary | ICD-10-CM | POA: Diagnosis not present

## 2024-02-19 DIAGNOSIS — M25571 Pain in right ankle and joints of right foot: Secondary | ICD-10-CM | POA: Diagnosis not present

## 2024-02-19 DIAGNOSIS — M25561 Pain in right knee: Secondary | ICD-10-CM | POA: Diagnosis not present

## 2024-02-19 DIAGNOSIS — M5459 Other low back pain: Secondary | ICD-10-CM

## 2024-02-19 DIAGNOSIS — R293 Abnormal posture: Secondary | ICD-10-CM

## 2024-02-19 DIAGNOSIS — G8929 Other chronic pain: Secondary | ICD-10-CM | POA: Diagnosis not present

## 2024-02-19 DIAGNOSIS — R262 Difficulty in walking, not elsewhere classified: Secondary | ICD-10-CM

## 2024-02-19 DIAGNOSIS — M25562 Pain in left knee: Secondary | ICD-10-CM | POA: Diagnosis not present
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
## 2024-02-20 ENCOUNTER — Other Ambulatory Visit (HOSPITAL_BASED_OUTPATIENT_CLINIC_OR_DEPARTMENT_OTHER): Payer: Self-pay

## 2024-02-20 ENCOUNTER — Ambulatory Visit (INDEPENDENT_AMBULATORY_CARE_PROVIDER_SITE_OTHER): Admitting: Family Medicine

## 2024-02-20 ENCOUNTER — Encounter: Payer: Self-pay | Admitting: Family Medicine

## 2024-02-20 VITALS — BP 132/83 | HR 60 | Temp 98.3°F | Ht 64.0 in | Wt 361.0 lb

## 2024-02-20 DIAGNOSIS — R632 Polyphagia: Secondary | ICD-10-CM

## 2024-02-20 DIAGNOSIS — Z7289 Other problems related to lifestyle: Secondary | ICD-10-CM

## 2024-02-20 DIAGNOSIS — Z6841 Body Mass Index (BMI) 40.0 and over, adult: Secondary | ICD-10-CM | POA: Diagnosis not present

## 2024-02-20 DIAGNOSIS — R7303 Prediabetes: Secondary | ICD-10-CM | POA: Diagnosis not present

## 2024-02-20 DIAGNOSIS — M25562 Pain in left knee: Secondary | ICD-10-CM

## 2024-02-20 DIAGNOSIS — Z9189 Other specified personal risk factors, not elsewhere classified: Secondary | ICD-10-CM

## 2024-02-20 DIAGNOSIS — G8929 Other chronic pain: Secondary | ICD-10-CM | POA: Diagnosis not present

## 2024-02-20 DIAGNOSIS — G4733 Obstructive sleep apnea (adult) (pediatric): Secondary | ICD-10-CM | POA: Diagnosis not present

## 2024-02-20 DIAGNOSIS — F419 Anxiety disorder, unspecified: Secondary | ICD-10-CM | POA: Diagnosis not present

## 2024-02-20 DIAGNOSIS — M25561 Pain in right knee: Secondary | ICD-10-CM

## 2024-02-20 DIAGNOSIS — E66813 Obesity, class 3: Secondary | ICD-10-CM

## 2024-02-20 DIAGNOSIS — F32A Depression, unspecified: Secondary | ICD-10-CM | POA: Diagnosis not present

## 2024-02-20 MED ORDER — TOPIRAMATE 50 MG PO TABS
50.0000 mg | ORAL_TABLET | Freq: Every day | ORAL | 0 refills | Status: DC
  Filled 2024-02-20: qty 30, 30d supply, fill #0

## 2024-02-20 MED ORDER — NALTREXONE HCL 50 MG PO TABS
25.0000 mg | ORAL_TABLET | Freq: Every day | ORAL | 0 refills | Status: DC
  Filled 2024-02-20: qty 15, 30d supply, fill #0

## 2024-02-20 NOTE — Patient Instructions (Signed)
 Change Topamax  to once a day - with first meal of the day  Begin Naltrexone  1/2 tab with your last meal of the day (for emotional eating and carb/ sugar cravings)  Continue counseling and continue to work on self care and mindfulness  Continue PT/ water therapy

## 2024-02-20 NOTE — Progress Notes (Signed)
 Office: [phone]  /  Fax: [phone]  WEIGHT SUMMARY AND BIOMETRICS  Starting Date: 03/08/23  Starting Weight: 369lb   Weight Lost Since Last Visit: 2lb   Vitals Temp: 98.3 F (36.8 C) BP: 132/83 Pulse Rate: 60 SpO2: 99 %   Body Composition  Body Fat %: 61.3 % Fat Mass (lbs): 221.8 lbs Muscle Mass (lbs): 133 lbs Visceral Fat Rating : 27    HPI  Chief Complaint: OBESITY  Marissa Joseph is here to discuss her progress with her obesity treatment plan. She is on the the Category 3 Plan and states she is following her eating plan approximately 80 % of the time. She states she is exercising 30 minutes 2 times per week.  Interval History:  Since last office visit she is down 2 lb She has up 1.6 pounds of muscle mass and down 3.2 pounds of body fat since last visit She has been more motivated to eat healthier and meal plan but she hasn't been able to exercise much due to back pain She started PT for R ankle pain and plans to start water therapy She did not see any change in appetite coming off of Lomaira  She is on Topirmate 50 mg bid for food impulse control  She is wearing her CPAP at night > 4 hrs at night She is still working 2nd shift She is working on sticking to her meal plan more  She has a net wight los of 8 lb in 11 mos of medically supervised weight management This is a 2.1% total body weight loss She has had a hard time making herself a priority and remains very fearful of bariatric surgery or even use of a GLP-1 receptor agonist  Pharmacotherapy: Topiramate  50 mg twice daily  PHYSICAL EXAM:  Blood pressure 132/83, pulse 60, temperature 98.3 F (36.8 C), height 5' 4 (1.626 m), weight (!) 361 lb (163.7 kg), SpO2 99%. Body mass index is 61.97 kg/m.  General: She is overweight, cooperative, alert, well developed, and in no acute distress. PSYCH: Has normal mood, affect and thought process.   Lungs: Normal breathing effort, no conversational  dyspnea.   ASSESSMENT AND PLAN  TREATMENT PLAN FOR OBESITY:  Recommended Dietary Goals  Marissa Joseph is currently in the action stage of change. As such, her goal is to continue weight management plan. She has agreed to the Category 3 Plan and keeping a food journal and adhering to recommended goals of 1600 calories and 100+ grams of protein.  Behavioral Intervention  We discussed the following Behavioral Modification Strategies today: increasing lean protein intake to established goals, increasing fiber rich foods, increasing water intake , work on meal planning and preparation, work on tracking and journaling calories using tracking application, keeping healthy foods at home, work on managing stress, creating time for self-care and relaxation, avoiding temptations and identifying enticing environmental cues, continue to practice mindfulness when eating, planning for success, continue to work on maintaining a reduced calorie state, getting the recommended amount of protein, incorporating whole foods, making healthy choices, staying well hydrated and practicing mindfulness when eating., and increase protein intake, fibrous foods (25 grams per day for women, 30 grams for men) and water to improve satiety and decrease hunger signals. .  Additional resources provided today: NA  Recommended Physical Activity Goals  Marissa Joseph has been advised to work up to 150 minutes of moderate intensity aerobic activity a week and strengthening exercises 2-3 times per week for cardiovascular health, weight loss maintenance and preservation of muscle  mass.   She has agreed to Think about enjoyable ways to increase daily physical activity and overcoming barriers to exercise and Increase physical activity in their day and reduce sedentary time (increase NEAT). Continue physical therapy  Pharmacotherapy changes for the treatment of obesity: Change topiramate  to 50 mg once daily in the morning, adding on naltrexone  25 mg  with dinner  ASSOCIATED CONDITIONS ADDRESSED TODAY  Polyphagia She continues to skip meals.  She denies overconsumption at her 1 large meal or over snacking however weight loss is less than expected.  Recommend tracking daily calorie intake.  She sees some improvement in food impulse control on topiramate  50 mg without adverse side effects. -     Topiramate ; Take 1 tablet (50 mg total) by mouth daily.  Dispense: 30 tablet; Refill: 0  Class 3 severe obesity due to excess calories with body mass index (BMI) of 60.0 to 69.9 in adult We reviewed patient's overall progress in the past 11 months of medically supervised weight management.  She has not lost 5% of her total body weight on the prescribed diet.  Depressed mood and sedentary lifestyle have been barriers to her progress.  We have discussed bariatric surgery once again answered questions.  She is fearful of general anesthesia.  We discussed the long-term health consequences of carrying a BMI over 60.  She is also fearful of use of a GLP-1 receptor agonist and questions about medication was answered today.  Will CC note to her PCP about continuing discussions about a more proactive plan for obesity treatment.  -     Naltrexone  HCl; Take 0.5 tablets (25 mg total) by mouth daily.  Dispense: 15 tablet; Refill: 0  Sedentary lifestyle Unchanged.  Joint pain has continued to limit her ability to walk for exercise and she is starting both physical therapy and water therapy now.  Anxiety and depression This continues to be a barrier to her progress.  She is tearful today that she has for a long time struggled with making herself a priority.  She has been referred to behavioral therapy and her depressed mood has continued to factor into her obesity issues.  She is on Wellbutrin  XL 300 mg once daily with her PCP.  Continue current medications along with a plan to start psychotherapy.  OSA treated with CPAP She reports compliant use of CPAP at  night Continue active plan for weight reduction and consider use of Zepbound  given results of the surmount trial.  Chronic pain of both knees Unchanged, starting physical therapy Continue active plan for weight reduction  Prediabetes Lab Results  Component Value Date   HGBA1C 5.7 01/14/2024  Continue active plan for weight reduction.  Avoid high sugar foods and drinks.  Read labels on food and drink products for added sugar, avoiding products with over 8 g of added sugar per serving.   She was informed of the importance of frequent follow up visits to maximize her success with intensive lifestyle modifications for her multiple health conditions.   ATTESTASTION STATEMENTS:  Reviewed by clinician on day of visit: allergies, medications, problem list, medical history, surgical history, family history, social history, and previous encounter notes pertinent to obesity diagnosis.   I have personally spent 36 minutes total time today in preparation, patient care, nutritional counseling and education,  and documentation for this visit, including the following: review of most recent clinical lab tests, prescribing medications/ refilling medications, reviewing medical assistant documentation, review and interpretation of bioimpedence results.  Marissa Joseph, D.O. DABFM, DABOM [facility] [address] [place], [place] [address] [phone]

## 2024-02-21 ENCOUNTER — Encounter (HOSPITAL_BASED_OUTPATIENT_CLINIC_OR_DEPARTMENT_OTHER): Payer: Self-pay

## 2024-02-21 ENCOUNTER — Ambulatory Visit (HOSPITAL_BASED_OUTPATIENT_CLINIC_OR_DEPARTMENT_OTHER)

## 2024-02-21 DIAGNOSIS — M5459 Other low back pain: Secondary | ICD-10-CM | POA: Diagnosis not present

## 2024-02-21 DIAGNOSIS — R262 Difficulty in walking, not elsewhere classified: Secondary | ICD-10-CM | POA: Diagnosis not present

## 2024-02-21 DIAGNOSIS — M549 Dorsalgia, unspecified: Secondary | ICD-10-CM | POA: Diagnosis not present

## 2024-02-21 DIAGNOSIS — M25562 Pain in left knee: Secondary | ICD-10-CM

## 2024-02-21 DIAGNOSIS — M6281 Muscle weakness (generalized): Secondary | ICD-10-CM

## 2024-02-21 DIAGNOSIS — M25571 Pain in right ankle and joints of right foot: Secondary | ICD-10-CM

## 2024-02-21 DIAGNOSIS — G8929 Other chronic pain: Secondary | ICD-10-CM | POA: Diagnosis not present

## 2024-02-21 DIAGNOSIS — R279 Unspecified lack of coordination: Secondary | ICD-10-CM | POA: Diagnosis not present

## 2024-02-21 DIAGNOSIS — R293 Abnormal posture: Secondary | ICD-10-CM | POA: Diagnosis not present

## 2024-02-21 DIAGNOSIS — M25561 Pain in right knee: Secondary | ICD-10-CM | POA: Diagnosis not present
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
## 2024-02-25 ENCOUNTER — Other Ambulatory Visit: Payer: Self-pay

## 2024-02-27 ENCOUNTER — Ambulatory Visit (INDEPENDENT_AMBULATORY_CARE_PROVIDER_SITE_OTHER): Admitting: Licensed Clinical Social Worker

## 2024-02-27 ENCOUNTER — Other Ambulatory Visit (HOSPITAL_BASED_OUTPATIENT_CLINIC_OR_DEPARTMENT_OTHER): Payer: Self-pay

## 2024-02-27 DIAGNOSIS — F331 Major depressive disorder, recurrent, moderate: Secondary | ICD-10-CM | POA: Diagnosis not present
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
## 2024-02-29 ENCOUNTER — Ambulatory Visit (HOSPITAL_BASED_OUTPATIENT_CLINIC_OR_DEPARTMENT_OTHER): Payer: Self-pay | Admitting: Physical Therapy

## 2024-02-29 ENCOUNTER — Encounter (HOSPITAL_BASED_OUTPATIENT_CLINIC_OR_DEPARTMENT_OTHER): Payer: Self-pay | Admitting: Physical Therapy

## 2024-02-29 DIAGNOSIS — M25571 Pain in right ankle and joints of right foot: Secondary | ICD-10-CM | POA: Diagnosis not present

## 2024-02-29 DIAGNOSIS — R293 Abnormal posture: Secondary | ICD-10-CM | POA: Diagnosis not present

## 2024-02-29 DIAGNOSIS — M25562 Pain in left knee: Secondary | ICD-10-CM | POA: Diagnosis not present

## 2024-02-29 DIAGNOSIS — M5459 Other low back pain: Secondary | ICD-10-CM

## 2024-02-29 DIAGNOSIS — M549 Dorsalgia, unspecified: Secondary | ICD-10-CM | POA: Diagnosis not present

## 2024-02-29 DIAGNOSIS — M25561 Pain in right knee: Secondary | ICD-10-CM

## 2024-02-29 DIAGNOSIS — G8929 Other chronic pain: Secondary | ICD-10-CM | POA: Diagnosis not present

## 2024-02-29 DIAGNOSIS — M6281 Muscle weakness (generalized): Secondary | ICD-10-CM | POA: Diagnosis not present

## 2024-02-29 DIAGNOSIS — R279 Unspecified lack of coordination: Secondary | ICD-10-CM | POA: Diagnosis not present

## 2024-02-29 DIAGNOSIS — R262 Difficulty in walking, not elsewhere classified: Secondary | ICD-10-CM | POA: Diagnosis not present
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
## 2024-03-03 ENCOUNTER — Encounter (HOSPITAL_BASED_OUTPATIENT_CLINIC_OR_DEPARTMENT_OTHER): Payer: Self-pay | Admitting: Physical Therapy

## 2024-03-03 ENCOUNTER — Ambulatory Visit (HOSPITAL_BASED_OUTPATIENT_CLINIC_OR_DEPARTMENT_OTHER): Payer: Self-pay | Admitting: Physical Therapy

## 2024-03-03 DIAGNOSIS — M25571 Pain in right ankle and joints of right foot: Secondary | ICD-10-CM | POA: Diagnosis not present

## 2024-03-03 DIAGNOSIS — M5459 Other low back pain: Secondary | ICD-10-CM | POA: Diagnosis not present

## 2024-03-03 DIAGNOSIS — M6281 Muscle weakness (generalized): Secondary | ICD-10-CM

## 2024-03-03 DIAGNOSIS — R262 Difficulty in walking, not elsewhere classified: Secondary | ICD-10-CM | POA: Diagnosis not present

## 2024-03-03 DIAGNOSIS — M25561 Pain in right knee: Secondary | ICD-10-CM

## 2024-03-03 DIAGNOSIS — M25562 Pain in left knee: Secondary | ICD-10-CM | POA: Diagnosis not present

## 2024-03-03 DIAGNOSIS — R279 Unspecified lack of coordination: Secondary | ICD-10-CM | POA: Diagnosis not present

## 2024-03-03 DIAGNOSIS — M549 Dorsalgia, unspecified: Secondary | ICD-10-CM | POA: Diagnosis not present

## 2024-03-03 DIAGNOSIS — R293 Abnormal posture: Secondary | ICD-10-CM | POA: Diagnosis not present

## 2024-03-03 DIAGNOSIS — G8929 Other chronic pain: Secondary | ICD-10-CM | POA: Diagnosis not present
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
## 2024-03-07 ENCOUNTER — Encounter: Payer: Self-pay | Admitting: Internal Medicine

## 2024-03-07 ENCOUNTER — Encounter: Payer: Self-pay | Admitting: Neurology

## 2024-03-11 ENCOUNTER — Ambulatory Visit (HOSPITAL_BASED_OUTPATIENT_CLINIC_OR_DEPARTMENT_OTHER): Attending: Internal Medicine

## 2024-03-11 ENCOUNTER — Ambulatory Visit: Admitting: Licensed Clinical Social Worker

## 2024-03-11 ENCOUNTER — Encounter (HOSPITAL_BASED_OUTPATIENT_CLINIC_OR_DEPARTMENT_OTHER): Payer: Self-pay

## 2024-03-11 DIAGNOSIS — R279 Unspecified lack of coordination: Secondary | ICD-10-CM | POA: Diagnosis not present

## 2024-03-11 DIAGNOSIS — R262 Difficulty in walking, not elsewhere classified: Secondary | ICD-10-CM | POA: Diagnosis not present

## 2024-03-11 DIAGNOSIS — M25562 Pain in left knee: Secondary | ICD-10-CM | POA: Insufficient documentation

## 2024-03-11 DIAGNOSIS — M5459 Other low back pain: Secondary | ICD-10-CM | POA: Diagnosis not present

## 2024-03-11 DIAGNOSIS — M6281 Muscle weakness (generalized): Secondary | ICD-10-CM | POA: Insufficient documentation

## 2024-03-11 DIAGNOSIS — M25561 Pain in right knee: Secondary | ICD-10-CM | POA: Insufficient documentation

## 2024-03-11 DIAGNOSIS — M25571 Pain in right ankle and joints of right foot: Secondary | ICD-10-CM | POA: Diagnosis not present

## 2024-03-11 DIAGNOSIS — R293 Abnormal posture: Secondary | ICD-10-CM | POA: Insufficient documentation
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
## 2024-03-11 NOTE — Progress Notes (Signed)
 Subjective:    Patient ID: Marissa Joseph, female    DOB: Dec 24, 1980, 43 y.o.   MRN: [id number]      HPI Marissa Joseph is here for No chief complaint on file.        Medications and allergies reviewed with patient and updated if appropriate.  Current Outpatient Medications on File Prior to Visit  Medication Sig Dispense Refill   albuterol  (VENTOLIN  HFA) 108 (90 Base) MCG/ACT inhaler Inhale 1-2 puffs into the lungs every 6 (six) hours as needed for wheezing 6.7 g 5   ALPRAZolam  (XANAX ) 0.5 MG tablet Take 1 tablet (0.5 mg total) by mouth at bedtime as needed for anxiety or sleep (for sleep test use). 2 tablet 0   budesonide -formoterol  (SYMBICORT ) 80-4.5 MCG/ACT inhaler Inhale 2 puffs into the lungs 2 (two) times daily. 10.2 g 3   buPROPion  (WELLBUTRIN  XL) 300 MG 24 hr tablet Take 1 tablet (300 mg total) by mouth daily. 90 tablet 1   diclofenac  Sodium (VOLTAREN ) 1 % GEL Apply 4 g topically 4 (four) times daily as needed. 150 g 0   docusate sodium  (COLACE) 100 MG capsule Take 1 capsule (100 mg total) by mouth 2 (two) times daily. 60 capsule 0   EPINEPHrine  0.3 mg/0.3 mL IJ SOAJ injection Inject 0.3 mg into the muscle as needed for anaphylaxis. 2 each 0   famotidine  (PEPCID ) 20 MG tablet Take 1 tablet (20 mg total) by mouth daily. 7 tablet 0   fluconazole  (DIFLUCAN ) 150 MG tablet Take 1 tablet by mouth once every 3 days as needed 2 tablet 1   furosemide  (LASIX ) 20 MG tablet Take 1 tablet (20 mg total) by mouth daily. 90 tablet 1   gabapentin  (NEURONTIN ) 100 MG capsule Take 2 capsules (200 mg total) by mouth at bedtime. 60 capsule 5   indomethacin  (INDOCIN ) 50 MG capsule TAKE 1 CAPSULE BY MOUTH THREE TIMES DAILY WITH MEALS     lidocaine  (LIDODERM ) 5 % Place 1 patch onto the skin daily. Remove & Discard patch within 12 hours or as directed by MD 30 patch 0   medroxyPROGESTERone  (PROVERA ) 10 MG tablet Take 1 tablet (10 mg total) by mouth daily. Take for days to bring on menses 10 tablet  0   montelukast  (SINGULAIR ) 10 MG tablet Take 1 tablet (10 mg total) by mouth at bedtime. 90 tablet 1   naltrexone  (DEPADE) 50 MG tablet Take 0.5 tablets (25 mg total) by mouth daily. 15 tablet 0   nebivolol  (BYSTOLIC ) 5 MG tablet Take 1 tablet (5 mg total) by mouth daily. 90 tablet 3   olmesartan -hydrochlorothiazide  (BENICAR  HCT) 20-12.5 MG tablet Take 1 tablet by mouth daily. 90 tablet 1   polyethylene glycol powder (GLYCOLAX /MIRALAX ) 17 GM/SCOOP powder Take 17 g by mouth 2 (two) times daily as needed for severe constipation. 238 g 1   predniSONE  (DELTASONE ) 10 MG tablet Take 2 tablets (20 mg total) by mouth daily. 15 tablet 0   topiramate  (TOPAMAX ) 50 MG tablet Take 1 tablet (50 mg total) by mouth daily. 30 tablet 0   triamcinolone  cream (KENALOG ) 0.1 % Apply 1 Application topically 2 (two) times daily. 30 g 5   No current facility-administered medications on file prior to visit.    Review of Systems     Objective:  There were no vitals filed for this visit. BP Readings from Last 3 Encounters:  02/20/24 132/83  01/28/24 127/82  01/19/24 100/62   Wt Readings from Last 3 Encounters:  02/20/24 ROLLEN)  361 lb (163.7 kg)  01/28/24 (!) 363 lb (164.7 kg)  01/14/24 (!) 364 lb (165.1 kg)   There is no height or weight on file to calculate BMI.    Physical Exam         Assessment & Plan:    See Problem List for Assessment and Plan of chronic medical problems.

## 2024-03-12 ENCOUNTER — Ambulatory Visit: Admitting: Internal Medicine

## 2024-03-12 ENCOUNTER — Other Ambulatory Visit (HOSPITAL_BASED_OUTPATIENT_CLINIC_OR_DEPARTMENT_OTHER): Payer: Self-pay

## 2024-03-12 ENCOUNTER — Encounter: Payer: Self-pay | Admitting: Internal Medicine

## 2024-03-12 VITALS — BP 136/70 | HR 73 | Temp 98.0°F | Ht 64.0 in | Wt 360.0 lb

## 2024-03-12 DIAGNOSIS — L03011 Cellulitis of right finger: Secondary | ICD-10-CM | POA: Diagnosis not present

## 2024-03-12 DIAGNOSIS — I10 Essential (primary) hypertension: Secondary | ICD-10-CM

## 2024-03-12 MED ORDER — CEPHALEXIN 500 MG PO CAPS
500.0000 mg | ORAL_CAPSULE | Freq: Three times a day (TID) | ORAL | 0 refills | Status: AC
  Filled 2024-03-12: qty 15, 5d supply, fill #0

## 2024-03-12 MED ORDER — FLUCONAZOLE 150 MG PO TABS
150.0000 mg | ORAL_TABLET | ORAL | 0 refills | Status: AC
Start: 2024-03-12 — End: 2024-03-20
  Filled 2024-03-12: qty 2, 6d supply, fill #0

## 2024-03-12 NOTE — Patient Instructions (Addendum)
       Medications changes include :   keflex  three times a day for 5 days.  Diflucan  if needed     Return if symptoms worsen or fail to improve.

## 2024-03-12 NOTE — Assessment & Plan Note (Signed)
 Acute Has had some discharge but still has pus present and swelling, tenderness Start keflex  500 mg TID x 5 days Warm soaks, antibacterial ointment If not improving can extend antibiotics

## 2024-03-12 NOTE — Assessment & Plan Note (Signed)
 Chronic Blood pressure borderline controlled She does not want to start any other medication Working on weight loss Continue Benicar  HCT 20-12.5 mg daily, nebivolol   to 5 mg daily

## 2024-03-13 ENCOUNTER — Encounter (HOSPITAL_BASED_OUTPATIENT_CLINIC_OR_DEPARTMENT_OTHER): Payer: Self-pay | Admitting: Physical Therapy

## 2024-03-13 ENCOUNTER — Ambulatory Visit: Admitting: Licensed Clinical Social Worker

## 2024-03-13 ENCOUNTER — Ambulatory Visit (HOSPITAL_BASED_OUTPATIENT_CLINIC_OR_DEPARTMENT_OTHER): Admitting: Physical Therapy

## 2024-03-13 DIAGNOSIS — M5459 Other low back pain: Secondary | ICD-10-CM | POA: Diagnosis not present

## 2024-03-13 DIAGNOSIS — M25561 Pain in right knee: Secondary | ICD-10-CM | POA: Diagnosis not present

## 2024-03-13 DIAGNOSIS — M25571 Pain in right ankle and joints of right foot: Secondary | ICD-10-CM

## 2024-03-13 DIAGNOSIS — F331 Major depressive disorder, recurrent, moderate: Secondary | ICD-10-CM

## 2024-03-13 DIAGNOSIS — R262 Difficulty in walking, not elsewhere classified: Secondary | ICD-10-CM

## 2024-03-13 DIAGNOSIS — M25562 Pain in left knee: Secondary | ICD-10-CM | POA: Diagnosis not present

## 2024-03-13 DIAGNOSIS — R279 Unspecified lack of coordination: Secondary | ICD-10-CM | POA: Diagnosis not present

## 2024-03-13 DIAGNOSIS — M6281 Muscle weakness (generalized): Secondary | ICD-10-CM | POA: Diagnosis not present

## 2024-03-13 DIAGNOSIS — R293 Abnormal posture: Secondary | ICD-10-CM | POA: Diagnosis not present
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
## 2024-03-16 DIAGNOSIS — I10 Essential (primary) hypertension: Secondary | ICD-10-CM | POA: Diagnosis not present

## 2024-03-18 ENCOUNTER — Ambulatory Visit (HOSPITAL_BASED_OUTPATIENT_CLINIC_OR_DEPARTMENT_OTHER): Admitting: Physical Therapy

## 2024-03-18 ENCOUNTER — Encounter (HOSPITAL_BASED_OUTPATIENT_CLINIC_OR_DEPARTMENT_OTHER): Payer: Self-pay | Admitting: Physical Therapy

## 2024-03-18 DIAGNOSIS — R279 Unspecified lack of coordination: Secondary | ICD-10-CM | POA: Diagnosis not present

## 2024-03-18 DIAGNOSIS — R262 Difficulty in walking, not elsewhere classified: Secondary | ICD-10-CM | POA: Diagnosis not present

## 2024-03-18 DIAGNOSIS — M25571 Pain in right ankle and joints of right foot: Secondary | ICD-10-CM

## 2024-03-18 DIAGNOSIS — M25561 Pain in right knee: Secondary | ICD-10-CM | POA: Diagnosis not present

## 2024-03-18 DIAGNOSIS — R293 Abnormal posture: Secondary | ICD-10-CM | POA: Diagnosis not present

## 2024-03-18 DIAGNOSIS — M6281 Muscle weakness (generalized): Secondary | ICD-10-CM | POA: Diagnosis not present

## 2024-03-18 DIAGNOSIS — M5459 Other low back pain: Secondary | ICD-10-CM | POA: Diagnosis not present

## 2024-03-18 DIAGNOSIS — M25562 Pain in left knee: Secondary | ICD-10-CM | POA: Diagnosis not present
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
## 2024-03-21 ENCOUNTER — Ambulatory Visit (HOSPITAL_BASED_OUTPATIENT_CLINIC_OR_DEPARTMENT_OTHER): Admitting: Physical Therapy

## 2024-03-23 ENCOUNTER — Other Ambulatory Visit: Payer: Self-pay | Admitting: Internal Medicine

## 2024-03-23 ENCOUNTER — Other Ambulatory Visit: Payer: Self-pay | Admitting: Family Medicine

## 2024-03-23 DIAGNOSIS — R632 Polyphagia: Secondary | ICD-10-CM

## 2024-03-24 ENCOUNTER — Encounter: Payer: Self-pay | Admitting: Family Medicine

## 2024-03-24 ENCOUNTER — Other Ambulatory Visit: Payer: Self-pay

## 2024-03-24 ENCOUNTER — Other Ambulatory Visit (HOSPITAL_BASED_OUTPATIENT_CLINIC_OR_DEPARTMENT_OTHER): Payer: Self-pay

## 2024-03-24 ENCOUNTER — Ambulatory Visit (INDEPENDENT_AMBULATORY_CARE_PROVIDER_SITE_OTHER): Admitting: Family Medicine

## 2024-03-24 VITALS — BP 118/77 | HR 71 | Temp 97.8°F | Ht 64.0 in | Wt 360.0 lb

## 2024-03-24 DIAGNOSIS — R7303 Prediabetes: Secondary | ICD-10-CM

## 2024-03-24 DIAGNOSIS — Z7289 Other problems related to lifestyle: Secondary | ICD-10-CM | POA: Diagnosis not present

## 2024-03-24 DIAGNOSIS — G8929 Other chronic pain: Secondary | ICD-10-CM | POA: Diagnosis not present

## 2024-03-24 DIAGNOSIS — Z6841 Body Mass Index (BMI) 40.0 and over, adult: Secondary | ICD-10-CM | POA: Diagnosis not present

## 2024-03-24 DIAGNOSIS — R632 Polyphagia: Secondary | ICD-10-CM

## 2024-03-24 DIAGNOSIS — K5900 Constipation, unspecified: Secondary | ICD-10-CM

## 2024-03-24 DIAGNOSIS — M25562 Pain in left knee: Secondary | ICD-10-CM | POA: Diagnosis not present

## 2024-03-24 DIAGNOSIS — F419 Anxiety disorder, unspecified: Secondary | ICD-10-CM | POA: Diagnosis not present

## 2024-03-24 DIAGNOSIS — M25561 Pain in right knee: Secondary | ICD-10-CM

## 2024-03-24 DIAGNOSIS — G4733 Obstructive sleep apnea (adult) (pediatric): Secondary | ICD-10-CM | POA: Diagnosis not present

## 2024-03-24 DIAGNOSIS — Z9189 Other specified personal risk factors, not elsewhere classified: Secondary | ICD-10-CM

## 2024-03-24 DIAGNOSIS — E66813 Obesity, class 3: Secondary | ICD-10-CM

## 2024-03-24 DIAGNOSIS — F32A Depression, unspecified: Secondary | ICD-10-CM | POA: Diagnosis not present

## 2024-03-24 MED ORDER — TOPIRAMATE 50 MG PO TABS
50.0000 mg | ORAL_TABLET | Freq: Every day | ORAL | 0 refills | Status: DC
Start: 2024-03-24 — End: 2024-04-23
  Filled 2024-03-24: qty 30, 30d supply, fill #0

## 2024-03-24 MED ORDER — NALTREXONE HCL 50 MG PO TABS
25.0000 mg | ORAL_TABLET | Freq: Every day | ORAL | 0 refills | Status: DC
  Filled 2024-03-24: qty 15, 30d supply, fill #0

## 2024-03-24 MED ORDER — POLYETHYLENE GLYCOL 3350 17 GM/SCOOP PO POWD
17.0000 g | Freq: Two times a day (BID) | ORAL | 1 refills | Status: AC | PRN
  Filled 2024-03-24: qty 238, 7d supply, fill #0

## 2024-03-24 NOTE — Progress Notes (Signed)
 Office: [phone]  /  Fax: [phone]  WEIGHT SUMMARY AND BIOMETRICS  Starting Date: 03/08/23  Starting Weight: 369lb   Weight Lost Since Last Visit: 1lb   Vitals Temp: 97.8 F (36.6 C) BP: 118/77 Pulse Rate: 71 SpO2: 100 %   Body Composition  Body Fat %: 61.2 % Fat Mass (lbs): 220.4 lbs Muscle Mass (lbs): 132.6 lbs Visceral Fat Rating : 26     HPI  Chief Complaint: OBESITY  Marissa Joseph is here to discuss her progress with her obesity treatment plan. She is on the the Category 3 Plan and states she is following her eating plan approximately 80 % of the time. She states she is exercising 30-60 minutes 2 times per week.   Interval History:  Since last office visit she is down 1 lb This gives her a net weight loss of 9 lb in 12 mos of medically supervised weight management She is trying to track her calories more often aiming for 1600 cal/ day She is eating more veggies She does feel better when she is eating better There have been some emotional sugar cravings but she is still mindful of food choices She plans to start water therapy soon She did move topiramate  to 50 mg in the morning, adding Naltrexone  1/2 tab at dinner She is wearing her Bipap at night Stress levels remain high She did start counseling  Pharmacotherapy: Topiramate  50 mg in the AM, Naltrexone  25 mg with dinner   PHYSICAL EXAM:  Blood pressure 118/77, pulse 71, temperature 97.8 F (36.6 C), height 5' 4 (1.626 m), weight (!) 360 lb (163.3 kg), SpO2 100%. Body mass index is 61.79 kg/m.  General: She is overweight, cooperative, alert, well developed, and in no acute distress. PSYCH: Has normal mood, affect and thought process.  Slightly tearful Lungs: Normal breathing effort, no conversational dyspnea.  ASSESSMENT AND PLAN  TREATMENT PLAN FOR OBESITY:  Recommended Dietary Goals  Marissa Joseph is currently in the action stage of change. As such, her goal is to continue weight management  plan. She has agreed to keeping a food journal and adhering to recommended goals of 1600 calories and 100 g of  protein and practicing portion control and making smarter food choices, such as increasing vegetables and decreasing simple carbohydrates.  Behavioral Intervention  We discussed the following Behavioral Modification Strategies today: increasing lean protein intake to established goals, increasing fiber rich foods, increasing water intake , work on meal planning and preparation, work on Counselling psychologist calories using tracking application, keeping healthy foods at home, work on managing stress, creating time for self-care and relaxation, avoiding temptations and identifying enticing environmental cues, avoid all or none thinking, display self-compassion, and rethink your why .  Additional resources provided today: NA  Recommended Physical Activity Goals  Marissa Joseph has been advised to work up to 150 minutes of moderate intensity aerobic activity a week and strengthening exercises 2-3 times per week for cardiovascular health, weight loss maintenance and preservation of muscle mass.   She has agreed to Think about enjoyable ways to increase daily physical activity and overcoming barriers to exercise and Increase physical activity in their day and reduce sedentary time (increase NEAT). Plan to start water PT in the next month  Pharmacotherapy changes for the treatment of obesity: none  ASSOCIATED CONDITIONS ADDRESSED TODAY  Anxiety and depression She did start counseling She is taking Wellbutrin  XL 300 mg once daily There has been some emotional eating tendencies Encouraged her to continue focus on self-care,  proper sleep at night and keeping junk food triggers out of the house  Polyphagia Improving using topiramate  50 mg in the morning and naltrexone  25 mg in the evening without adverse side effect -     Topiramate ; Take 1 tablet (50 mg total) by mouth daily.  Dispense: 30  tablet; Refill: 0 -     Naltrexone  HCl; Take 0.5 tablets (25 mg total) by mouth daily.  Dispense: 15 tablet; Refill: 0  Constipation, unspecified constipation type Improving with use of -     Polyethylene Glycol 3350 ; Take 17 g by mouth 2 (two) times daily as needed for severe constipation.  Dispense: 238 g; Refill: 1  Class 3 severe obesity due to excess calories with serious comorbidity and body mass index (BMI) of 60.0 to 69.9 in adult [facility]) She has declined GLP-1 receptor agonist for weight loss surgery  Sedentary lifestyle Unchanged, limited by chronic knee pain and morbid obesity  Chronic pain of both knees She has not yet started water PT due to an outbreak of eczema.  She plans to talk to her PCP about starting aqua PT in the next month Her goal would be to get back to Zumba or dance for exercise She needs additional BMI reduction in order to have knee surgery in the future  Prediabetes Lab Results  Component Value Date   HGBA1C 5.7 01/14/2024  She has been in the prediabetic range and will be due for repeat A1c in the next 1 to 2 months.  Continue to work on reducing added sugar and refined carbohydrates while increasing levels of physical activity as tolerated.  Consider the addition of metformin which she has never used in the past.  Consider referral for DPP program.  OSA treated with BiPAP She has improved use of her BiPAP at night for OSA.     She was informed of the importance of frequent follow up visits to maximize her success with intensive lifestyle modifications for her multiple health conditions.   ATTESTASTION STATEMENTS:  Reviewed by clinician on day of visit: allergies, medications, problem list, medical history, surgical history, family history, social history, and previous encounter notes pertinent to obesity diagnosis.   I have personally spent 30 minutes total time today in preparation, patient care, nutritional counseling and education,  and  documentation for this visit, including the following: review of most recent clinical lab tests, prescribing medications/ refilling medications, reviewing medical assistant documentation, review and interpretation of bioimpedence results.     Darice Haddock, D.O. DABFM, DABOM [facility] [address] [place], [place] [address] [phone]

## 2024-03-25 ENCOUNTER — Other Ambulatory Visit (HOSPITAL_BASED_OUTPATIENT_CLINIC_OR_DEPARTMENT_OTHER): Payer: Self-pay

## 2024-03-25 ENCOUNTER — Encounter (HOSPITAL_BASED_OUTPATIENT_CLINIC_OR_DEPARTMENT_OTHER): Payer: Self-pay | Admitting: Physical Therapy

## 2024-03-25 ENCOUNTER — Other Ambulatory Visit: Payer: Self-pay

## 2024-03-25 ENCOUNTER — Ambulatory Visit (HOSPITAL_BASED_OUTPATIENT_CLINIC_OR_DEPARTMENT_OTHER): Admitting: Physical Therapy

## 2024-03-25 DIAGNOSIS — M25571 Pain in right ankle and joints of right foot: Secondary | ICD-10-CM | POA: Diagnosis not present

## 2024-03-25 DIAGNOSIS — M6281 Muscle weakness (generalized): Secondary | ICD-10-CM

## 2024-03-25 DIAGNOSIS — M5459 Other low back pain: Secondary | ICD-10-CM | POA: Diagnosis not present

## 2024-03-25 DIAGNOSIS — R293 Abnormal posture: Secondary | ICD-10-CM

## 2024-03-25 DIAGNOSIS — R262 Difficulty in walking, not elsewhere classified: Secondary | ICD-10-CM

## 2024-03-25 DIAGNOSIS — M25562 Pain in left knee: Secondary | ICD-10-CM

## 2024-03-25 DIAGNOSIS — R279 Unspecified lack of coordination: Secondary | ICD-10-CM | POA: Diagnosis not present

## 2024-03-25 DIAGNOSIS — M25561 Pain in right knee: Secondary | ICD-10-CM | POA: Diagnosis not present

## 2024-03-25 MED ORDER — TRIAMCINOLONE ACETONIDE 0.1 % EX CREA
1.0000 | TOPICAL_CREAM | Freq: Two times a day (BID) | CUTANEOUS | 5 refills | Status: AC
  Filled 2024-03-25: qty 30, 30d supply, fill #0
  Filled 2024-04-23: qty 30, 30d supply, fill #1
  Filled 2024-05-21: qty 30, 30d supply, fill #2
  Filled 2024-06-18: qty 30, 30d supply, fill #3

## 2024-03-25 MED ORDER — FUROSEMIDE 20 MG PO TABS
20.0000 mg | ORAL_TABLET | Freq: Every day | ORAL | 1 refills | Status: AC
  Filled 2024-03-25: qty 30, 30d supply, fill #0
  Filled 2024-04-23: qty 30, 30d supply, fill #1
  Filled 2024-05-21: qty 30, 30d supply, fill #2
  Filled 2024-06-18: qty 30, 30d supply, fill #3
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
## 2024-03-28 ENCOUNTER — Ambulatory Visit (HOSPITAL_BASED_OUTPATIENT_CLINIC_OR_DEPARTMENT_OTHER): Admitting: Physical Therapy

## 2024-04-01 ENCOUNTER — Encounter (HOSPITAL_BASED_OUTPATIENT_CLINIC_OR_DEPARTMENT_OTHER): Payer: Self-pay | Admitting: Physical Therapy

## 2024-04-01 ENCOUNTER — Ambulatory Visit (HOSPITAL_BASED_OUTPATIENT_CLINIC_OR_DEPARTMENT_OTHER): Admitting: Physical Therapy

## 2024-04-01 DIAGNOSIS — M6281 Muscle weakness (generalized): Secondary | ICD-10-CM | POA: Diagnosis not present

## 2024-04-01 DIAGNOSIS — R262 Difficulty in walking, not elsewhere classified: Secondary | ICD-10-CM

## 2024-04-01 DIAGNOSIS — M25562 Pain in left knee: Secondary | ICD-10-CM | POA: Diagnosis not present

## 2024-04-01 DIAGNOSIS — M25571 Pain in right ankle and joints of right foot: Secondary | ICD-10-CM

## 2024-04-01 DIAGNOSIS — M5459 Other low back pain: Secondary | ICD-10-CM

## 2024-04-01 DIAGNOSIS — M25561 Pain in right knee: Secondary | ICD-10-CM | POA: Diagnosis not present

## 2024-04-01 DIAGNOSIS — R279 Unspecified lack of coordination: Secondary | ICD-10-CM | POA: Diagnosis not present

## 2024-04-01 DIAGNOSIS — R293 Abnormal posture: Secondary | ICD-10-CM | POA: Diagnosis not present
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
## 2024-04-03 ENCOUNTER — Ambulatory Visit (INDEPENDENT_AMBULATORY_CARE_PROVIDER_SITE_OTHER): Admitting: Licensed Clinical Social Worker

## 2024-04-03 DIAGNOSIS — F331 Major depressive disorder, recurrent, moderate: Secondary | ICD-10-CM
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
## 2024-04-04 ENCOUNTER — Ambulatory Visit (HOSPITAL_BASED_OUTPATIENT_CLINIC_OR_DEPARTMENT_OTHER): Admitting: Physical Therapy

## 2024-04-04 ENCOUNTER — Telehealth (HOSPITAL_BASED_OUTPATIENT_CLINIC_OR_DEPARTMENT_OTHER): Payer: Self-pay | Admitting: Physical Therapy

## 2024-04-04 NOTE — Telephone Encounter (Signed)
 No-show for today's PT appt- called and left voicemail about missed visit, also about next PT appt on 11/20.  Josette Rough, PT, DPT 04/04/24 9:55 AM

## 2024-04-07 ENCOUNTER — Encounter: Payer: Self-pay | Admitting: Radiology

## 2024-04-10 NOTE — Progress Notes (Unsigned)
 SABRA

## 2024-04-11 ENCOUNTER — Encounter (HOSPITAL_BASED_OUTPATIENT_CLINIC_OR_DEPARTMENT_OTHER): Payer: Self-pay | Admitting: Physical Therapy

## 2024-04-11 ENCOUNTER — Encounter: Payer: Self-pay | Admitting: Neurology

## 2024-04-11 ENCOUNTER — Ambulatory Visit (INDEPENDENT_AMBULATORY_CARE_PROVIDER_SITE_OTHER): Admitting: Neurology

## 2024-04-11 ENCOUNTER — Ambulatory Visit (HOSPITAL_BASED_OUTPATIENT_CLINIC_OR_DEPARTMENT_OTHER): Payer: Self-pay | Attending: Internal Medicine | Admitting: Physical Therapy

## 2024-04-11 VITALS — BP 112/68 | HR 61 | Ht 64.0 in | Wt 366.0 lb

## 2024-04-11 DIAGNOSIS — R262 Difficulty in walking, not elsewhere classified: Secondary | ICD-10-CM | POA: Insufficient documentation

## 2024-04-11 DIAGNOSIS — M25571 Pain in right ankle and joints of right foot: Secondary | ICD-10-CM | POA: Insufficient documentation

## 2024-04-11 DIAGNOSIS — G4734 Idiopathic sleep related nonobstructive alveolar hypoventilation: Secondary | ICD-10-CM | POA: Diagnosis not present

## 2024-04-11 DIAGNOSIS — M25562 Pain in left knee: Secondary | ICD-10-CM | POA: Insufficient documentation

## 2024-04-11 DIAGNOSIS — M5459 Other low back pain: Secondary | ICD-10-CM | POA: Insufficient documentation

## 2024-04-11 DIAGNOSIS — E66813 Obesity, class 3: Secondary | ICD-10-CM

## 2024-04-11 DIAGNOSIS — G4733 Obstructive sleep apnea (adult) (pediatric): Secondary | ICD-10-CM | POA: Diagnosis not present

## 2024-04-11 DIAGNOSIS — M6281 Muscle weakness (generalized): Secondary | ICD-10-CM | POA: Diagnosis not present

## 2024-04-11 DIAGNOSIS — Z6841 Body Mass Index (BMI) 40.0 and over, adult: Secondary | ICD-10-CM | POA: Diagnosis not present

## 2024-04-11 DIAGNOSIS — M25561 Pain in right knee: Secondary | ICD-10-CM | POA: Insufficient documentation

## 2024-04-11 DIAGNOSIS — R5383 Other fatigue: Secondary | ICD-10-CM | POA: Diagnosis not present

## 2024-04-11 DIAGNOSIS — G4726 Circadian rhythm sleep disorder, shift work type: Secondary | ICD-10-CM | POA: Diagnosis not present

## 2024-04-11 DIAGNOSIS — E662 Morbid (severe) obesity with alveolar hypoventilation: Secondary | ICD-10-CM

## 2024-04-11 NOTE — Progress Notes (Signed)
 Provider:  Dedra Gores, MD  Primary Care Physician:  Geofm Glade PARAS, MD [address] [place] [address]     Referring Provider: Geofm Glade PARAS, Md [address] [place],  [place] [address]          Chief Complaint according to patient   Patient presents with:           Shift worker on Cpap , was supposed to be on oxygen as well.  Compliance is impaired by shift work.       HISTORY OF PRESENT ILLNESS:  Marissa Joseph Joseph is a 43 y.o. female patient who is here for revisit 04/11/2024 : This patient presented  today after a new attempt to become compliant on CPAP - and has failed.  10 days of > 4 hours use only. She reports she sleeps better with the CPAP on but she is too tired and already too sleepy to not forget putting the machine on when she comes home in early AM>  In spite of being prescribed Oxygen after the PSG results in June showed she needs it, she has not received it.!!!  See further recommendation: Main recommendation is WEIGHT LOSS,  Zepbound  is FDA approved for her underlying diagnosis.  I need to see a higher compliance level, too.  . She is very depressed, tearful when speaking about her weight,  reports her only way in the past was with herba life , bt its costly.  She sees no success  on topiramate  ,wellbutrin   (and naltrexone )  after about  6 months . She has no appetite and is not feeling thirsty, may under hydrate herself.       Current ESS at 12 / 24,   Did not fill the FSS today.    Dx after repeated PSG with oxygen:  : shift worker with  a sleep hypopnea pattern and associated hypoxia , a finding typical for obesity hypoventilation syndrome.    The AHI improved under CPAP titration.   She did receive 2 liters of oxygen. Moderately severe Obstructive apnea/ hypopnea at 15.3 /h  was the baseline. .   The treatment started under 4 cm water pressure and increased to a final explored pressure of 11 cm water, 3 cm EPR and had an  insufficient  impact on hypoxia - needed 02 added.  The associated AHI  0.8 /h and based on 78 minutes of sleep time, the hypoxia time was  50 minutes ( out of 78 minutes !). The mask used was not noted in the technologist report.   1.  Hypoxemia was not resolved with therapy.  1 l of oxygen was added to the PAP airflow.  2. Total sleep time was reduced.  The quality of  sleep did not change with positive airway pressure.    RECOMMENDATIONS: The tolerance of CPAP therapy was limited due to coughing and the type of interface used was unfortunately not mentioned- I recommend a trial of CPAP- with autotitration,  beginning at 6 cm water pressure with 2 cm H20 EPR and advancing to 12 cm water with 2 cm EPR,  and heated humidification, with 2 liter oxygen to be expressed into the PAP device for the duration of the CPAP use.    See further recommendation: Main recommendation is WEIGHT LOSS,  Zepbound  is FDA approved for her underlying diagnosis.   I need to see a higher compliance level, too.  SABRA  Chief concern according to patient :     Fam Hx : mother on CPAP with OSA, MGM deceased- was on CPAP, Uncle maternal site is on CPAP<.  Social HX; Social history:  shift worker - Patient is working as a science writer ,  and lives in a household alone  with her dog. The patient currently works in shifts( chief technology officer,) Pets are ** present. Tobacco use; vaping - daily .   ETOH use ; quit ,  Caffeine intake in form of Coffee( 1 -2 cup a day) Soda( /) Tea ( /) or energy drinks. [name] is working the hours between 3 PM and 11 PM currently -but she has a almost 0.5 hours of commute-  hour distance before she will come home.  So she arrives home between 1230 and 1 AM she will let the dog out, cleans her home, her TV will be set to a time a and usually she may be asleep by about 3:30 AM she has 1 bathroom break usually around 7 to 8 AM she will rise no later than 9 AM, by 11 AM she has her main meal  which is breakfast these days.     CONSULTATION _ Marissa Joseph Joseph is a 43 y.o. female patient who is seen here upon  referral by her physician at weight and wellness - on 08/30/2023  for an evaluation of apnea in the setting of poor sleep quality, witnessed apneas and snoring and obesity. BMI of 63.7 .  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       The patient had the first sleep study  ( PSG ) in the year 2008 at [facility] with Dr Neysa, was diagnosed  with OSA and OHV.  She was later admitted to hospital with pneumonia, asthma and cellulitis ( 2013 ) , there was put on biPAP . Not after sleep study because she had no insurance coverage.  She was again discharged without BiPAP.     Sleep relevant medical history: superobesity - asthma and excema-  gained wight after prednisone  treatment beginning in 9 th grade. ,  Cellulitis, open wound left  leg , non healing  spider bite left leg, obesity hypoventilation, No ENT surgery, concussion in daycare at age 31 , Asthma, actively vaping,          Review of Systems: Out of a complete 14 system review, the patient complains of only the following symptoms, and all other reviewed systems are negative.:   SLEEPINESS ?  How likely are you to doze in the following situations: 0 = not likely, 1 = slight chance, 2 = moderate chance, 3 = high chance  Sitting and Reading? Watching Television? Sitting inactive in a public place (theater or meeting)? Lying down in the afternoon when circumstances permit? Sitting and talking to someone? Sitting quietly after lunch without alcohol? In a car, while stopped for a few minutes in traffic? As a passenger in a car for an hour without a break?  Total =  12/ 24          Social History   Socioeconomic History   Marital status: Single    Spouse name: Not on file   Number  of children: Not on file   Years of education: Not on file   Highest education level: Some college, no degree  Occupational History   Not on file  Tobacco Use   Smoking status: Former    Current packs/day: 0.20    Average packs/day: 0.2 packs/day for 5.0 years (1.0 ttl pk-yrs)    Types: Cigarettes   Smokeless tobacco: Former   Tobacco comments:    Education Administrator Use   Vaping status: Every Day  Substance and Sexual Activity   Alcohol use: Not Currently    Comment: occ   Drug use: Yes    Types: Marijuana    Comment: pot-about 1 joint BID   Sexual activity: Yes    Birth control/protection: Condom  Other Topics Concern   Not on file  Social History Narrative   Not on file   Social Drivers of Health   Financial Resource Strain: Medium Risk (01/11/2024)   Overall Financial Resource Strain (CARDIA)    Difficulty of Paying Living Expenses: Somewhat hard  Food Insecurity: Food Insecurity Present (01/11/2024)   Hunger Vital Sign    Worried About Running Out of Food in the Last Year: Sometimes true    Ran Out of Food in the Last Year: Often true  Transportation Needs: No Transportation Needs (01/11/2024)   PRAPARE - Administrator, Civil Service (Medical): No    Lack of Transportation (Non-Medical): No  Physical Activity: Insufficiently Active (01/11/2024)   Exercise Vital Sign    Days of Exercise per Week: 2 days    Minutes of Exercise per Session: 40 min  Stress: Stress Concern Present (01/11/2024)   [employer] of Occupational Health - Occupational Stress Questionnaire    Feeling of Stress: To some extent  Social Connections: Moderately Integrated (01/11/2024)   Social Connection and Isolation Panel    Frequency of Communication with Friends and Family: More than three times a week    Frequency of Social Gatherings with Friends and Family: Once a  week    Attends Religious Services: More than 4 times per year    Active Member of [employer] or Organizations: Yes    Attends Banker Meetings: Never    Marital Status: Never married    Family History  Problem Relation Age of Onset   Diabetes Mother    Sleep apnea Mother    Diabetes Father    Sleep apnea Maternal Grandmother    Heart disease Maternal Grandmother    Diabetes Maternal Grandmother    Stroke Maternal Grandfather    Breast cancer Maternal Uncle    Cancer Maternal Uncle        prostate   Breast cancer Paternal Aunt    Cancer Paternal Aunt        lung   Migraines Other    Stroke Other     Past Medical History:  Diagnosis Date   ADD (attention deficit disorder)    Anxiety    no official dx   Asthma    Back pain    Depression    Eczema    Edema    Fibroids    GONORRHEA 03/25/2008   Qualifier: Diagnosis of  By: [place]     Heart murmur    Heart murmur    Hypertension    Joint pain    MRSA (methicillin resistant staph aureus) culture positive 05/2012   cellulits/ left leg   Multiple food allergies    Obesity    Sleep  apnea     Past Surgical History:  Procedure Laterality Date   NO PAST SURGERIES     TEE WITHOUT CARDIOVERSION N/A 12/22/2019   Procedure: TRANSESOPHAGEAL ECHOCARDIOGRAM (TEE);  Surgeon: Kate Lonni CROME, MD;  Location[facility] ENDOSCOPY;  Service: Cardiovascular;  Laterality: N/A;     Current Outpatient Medications on File Prior to Visit  Medication Sig Dispense Refill   albuterol  (VENTOLIN  HFA) 108 (90 Base) MCG/ACT inhaler Inhale 1-2 puffs into the lungs every 6 (six) hours as needed for wheezing 6.7 g 5   budesonide -formoterol  (SYMBICORT ) 80-4.5 MCG/ACT inhaler Inhale 2 puffs into the lungs 2 (two) times daily. 10.2 g 3   buPROPion  (WELLBUTRIN  XL) 300 MG 24 hr tablet Take 1 tablet (300 mg total) by mouth daily. 90 tablet 1   diclofenac  Sodium (VOLTAREN ) 1 % GEL Apply 4 g topically 4 (four) times daily as needed.  150 g 0   docusate sodium  (COLACE) 100 MG capsule Take 1 capsule (100 mg total) by mouth 2 (two) times daily. 60 capsule 0   EPINEPHrine  0.3 mg/0.3 mL IJ SOAJ injection Inject 0.3 mg into the muscle as needed for anaphylaxis. 2 each 0   famotidine  (PEPCID ) 20 MG tablet Take 1 tablet (20 mg total) by mouth daily. 7 tablet 0   furosemide  (LASIX ) 20 MG tablet Take 1 tablet (20 mg total) by mouth daily. 90 tablet 1   gabapentin  (NEURONTIN ) 100 MG capsule Take 2 capsules (200 mg total) by mouth at bedtime. 60 capsule 5   medroxyPROGESTERone  (PROVERA ) 10 MG tablet Take 1 tablet (10 mg total) by mouth daily. Take for days to bring on menses 10 tablet 0   montelukast  (SINGULAIR ) 10 MG tablet Take 1 tablet (10 mg total) by mouth at bedtime. 90 tablet 1   naltrexone  (DEPADE) 50 MG tablet Take 0.5 tablets (25 mg total) by mouth daily. 15 tablet 0   nebivolol  (BYSTOLIC ) 5 MG tablet Take 1 tablet (5 mg total) by mouth daily. 90 tablet 3   olmesartan -hydrochlorothiazide  (BENICAR  HCT) 20-12.5 MG tablet Take 1 tablet by mouth daily. 90 tablet 1   polyethylene glycol powder (GLYCOLAX /MIRALAX ) 17 GM/SCOOP powder Take 17 g by mouth 2 (two) times daily as needed for severe constipation. 238 g 1   topiramate  (TOPAMAX ) 50 MG tablet Take 1 tablet (50 mg total) by mouth daily. 30 tablet 0   triamcinolone  cream (KENALOG ) 0.1 % Apply 1 Application topically 2 (two) times daily. 30 g 5   ALPRAZolam  (XANAX ) 0.5 MG tablet Take 1 tablet (0.5 mg total) by mouth at bedtime as needed for anxiety or sleep (for sleep test use). 2 tablet 0   No current facility-administered medications on file prior to visit.    Allergies  Allergen Reactions   Avocado Other (See Comments)    Throat itching and swelling   Shellfish Allergy Hives, Shortness Of Breath and Swelling     DIAGNOSTIC DATA (LABS, IMAGING, TESTING) - I reviewed patient records, labs, notes, testing and imaging myself where available.  Lab Results  Component Value  Date   WBC 4.6 01/14/2024   HGB 13.8 01/14/2024   HCT 41.6 01/14/2024   MCV 91.5 01/14/2024   PLT 236.0 01/14/2024      Component Value Date/Time   NA 139 01/14/2024 1417   K 4.1 01/14/2024 1417   CL 101 01/14/2024 1417   CO2 31 01/14/2024 1417   GLUCOSE 101 (H) 01/14/2024 1417   BUN 23 01/14/2024 1417   CREATININE 1.08 01/14/2024 1417  CREATININE 0.87 01/23/2020 1554   CALCIUM 9.4 01/14/2024 1417   PROT 7.9 01/14/2024 1417   ALBUMIN 4.0 01/14/2024 1417   AST 13 01/14/2024 1417   ALT 12 01/14/2024 1417   ALKPHOS 64 01/14/2024 1417   BILITOT 0.4 01/14/2024 1417   GFRNONAA >60 08/08/2022 2059   GFRNONAA 85 01/23/2020 1554   GFRAA 98 01/23/2020 1554   Lab Results  Component Value Date   CHOL 125 01/14/2024   HDL 37.20 (L) 01/14/2024   LDLCALC 70 01/14/2024   TRIG 91.0 01/14/2024   CHOLHDL 3 01/14/2024   Lab Results  Component Value Date   HGBA1C 5.7 01/14/2024   Lab Results  Component Value Date   VITAMINB12 1,169 03/08/2023   Lab Results  Component Value Date   TSH 1.09 01/14/2024    PHYSICAL EXAM:  Vitals:   04/11/24 0850  BP: 112/68  Pulse: 61  SpO2: 98%   No data found. Body mass index is 62.82 kg/m.   Wt Readings from Last 3 Encounters:  04/11/24 (!) 366 lb (166 kg)  03/24/24 (!) 360 lb (163.3 kg)  03/12/24 (!) 360 lb (163.3 kg)     Ht Readings from Last 3 Encounters:  04/11/24 5' 4 (1.626 m)  03/24/24 5' 4 (1.626 m)  03/12/24 5' 4 (1.626 m)      General: The patient is awake, alert and appears not in acute distress and groomed. Head: Normocephalic, atraumatic.  Neck is supple.  Mallampati 3,  neck circumference:16.5  inches .  Nasal airflow is patent.  Retrognathia is  seen.  Dental status:  biological, bruxism marks.  Cardiovascular:  Regular rate and cardiac rhythm by pulse,  without distended neck veins. Respiratory: Lungs are clear to auscultation.  Skin:  With evidence of ankle edema, no rash. Trunk: The patient's posture  is erect.   NEUROLOGIC EXAM: The patient is awake and alert, oriented to place and time.   Memory subjective described as intact.  Attention span & concentration ability appears normal.  Speech is fluent,  without  dysarthria, dysphonia or aphasia.  Mood and affect are depressed-    Cranial nerves: no loss of smell or taste reported  Pupils are equal in shape and size - and briskly reactive to light. Funduscopic exam deferred.  Extraocular movements in vertical and horizontal planes were intact , lazy eye on the right-  without nystagmus. Reports  Diplopia when reading on a screen.not here.  Visual fields by finger perimetry are intact. Hearing was intact to soft voice and finger rubbing.    Facial sensation intact to fine touch.  Facial motor strength is symmetric and tongue and uvula move midline.   ASSESSMENT AND PLAN :    Dear Dr Geofm, Dr Waylan:   Our mutual 43 y.o. year old female  patient here with:    1)  Super-obesity related hypoventilation, moderate severe obstructive sleep apnea and HYPOXIA ,  2 liters of oxygen and CPAP were prescribed.    2) shift work sleep disorder - needs to have her compliance adjusted for daytime use of CPAP- which is often not well recorded.    3)  Amenorrhea since May - 6 months  without  known cause.    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 Related to weight.     5) clinical depression.     RV in 6 months with NP, hopefully CPAP data will show better compliance.  I have re-ordered oxygen for  night time use at 2 liters     I would like to thank Geofm Glade PARAS, MD and Geofm Glade PARAS, Md [address] [place],  [place] [address] for allowing me to meet with this pleasant patient.   Sleep Clinic Patients are generally offered input on sleep hygiene, life style changes and how to improve compliance with medical treatment where applicable. Review and reiteration of good sleep hygiene measures  is offered to any sleep clinic patient, be it in the first consultation or with any follow up visits. Any patient with sleepiness should be cautioned not to drive, work at heights, or operate dangerous or heavy equipment when feeling tired or sleepy.      The patient will be seen in follow-up in the sleep clinic at [facility] for discussion of test results, sleep related symptoms and treatment compliance review, further management strategies, etc.   The referring provider will be notified of the test results.   The patient's condition requires frequent monitoring and adjustments in the treatment plan, reflecting the ongoing complexity of care.  This provider is the continuing focal point for all needed services for this condition.  After spending a total time of  30  minutes face to face and time for  history taking, physical and neurologic examination, review of laboratory studies,  personal review of imaging studies, reports and results of other testing and review of referral information / records as far as provided in visit,   Electronically signed by: Dedra Gores, MD 04/11/2024 9:17 AM  [facility] Board certified by The [employer] of Sleep Medicine and Diplomate of the [employer] of Sleep Medicine. Board certified In Neurology through the ABPN, Fellow of the [employer] of Neurology.

## 2024-04-11 NOTE — Progress Notes (Signed)
 90 Day Report

## 2024-04-11 NOTE — Patient Instructions (Signed)
 We wil keep your CPAP at current settings, I have again asked to add oxygen at night. I will follow up in 6 months .  Your diagnosis is obesity hypoventilation with a BMI of 63, moderate severe sleep apnea ( hypopnea) .  I hope to work with the weight loss physician on a more effective obesity treatment for you.  FDA approved Zepbound  should be considered, but is currently not covered by your insurance.       Living With Sleep Apnea Sleep apnea is a condition that affects your breathing while you're sleeping. Your tongue or the tissue in your throat may block the flow of air while you sleep. You may have shallow breathing or stop breathing for short periods of time. The breaks in breathing interrupt the deep sleep that you need to feel rested. Even if you don't wake up from the gaps in breathing, you may feel tired during the day. People with sleep apnea may snore loudly. You may have a headache in the morning and feel anxious or depressed. How can sleep apnea affect me? Sleep apnea increases your chances of being very tired during the day. This is called daytime fatigue. Sleep apnea can also increase your risk of: Heart attack. Stroke. Obesity. Type 2 diabetes. Heart failure. Irregular heartbeat. High blood pressure. If you are very tired during the day, you may be more likely to: Not do well in school or at work. Fall asleep while driving. Have trouble paying attention. Develop depression or anxiety. Have problems having sex. This is called sexual dysfunction. What actions can I take to manage sleep apnea? Sleep apnea treatment  If you were given a device to open your airway while you sleep, use it only as told by your health care provider. You may be given: An oral appliance. This is a mouthpiece that shifts your lower jaw forward. A continuous positive airway pressure (CPAP) device. This blows air through a mask. A nasal expiratory positive airway pressure (EPAP) device. This  has valves that you put into each nostril. A bi-level positive airway pressure (BIPAP) device. This blows air through a mask when you breathe in and breathe out. You may need surgery if other treatments don't work for you. Sleep habits Go to sleep and wake up at the same time every day. This helps set your internal clock for sleeping. If you stay up later than usual on weekends, try to get up in the morning within 2 hours of the time you usually wake up. Try to get at least 7-9 hours of sleep each night. Stop using a computer, tablet, and mobile phone a few hours before bedtime. Do not take long naps during the day. If you nap, limit it to 30 minutes. Have a relaxing bedtime routine. Reading or listening to music may relax you and help you sleep. Use your bedroom only for sleep. Keep your television and computer out of your bedroom. Keep your bedroom cool, dark, and quiet. Use a supportive mattress and pillows. Follow your provider's instructions for other changes to sleep habits. Nutrition Do not eat big meals in the evening. Do not have caffeine in the later part of the day. The effects of caffeine can last for more than 5 hours. Follow your provider's instructions for any changes to what you eat and drink. Lifestyle Do not drink alcohol before bedtime. Alcohol can cause you to fall asleep at first, but then it can cause you to wake up in the middle of the  night and have trouble getting back to sleep. Do not smoke, vape, or use nicotine or tobacco. Medicines Take over-the-counter and prescription medicines only as told by your provider. Do not use over-the-counter sleep medicine. You may become dependent on this medicine, and it can make sleep apnea worse. Do not take medicines, such as sedatives and narcotics, unless told to by your provider. Activity Exercise on most days, but avoid exercising in the evening. Exercising near bedtime can interfere with sleeping. If possible, spend  time outside every day. Natural light helps with your internal clock. General information Lose weight if you need to. Stay at a healthy weight. If you are having surgery, make sure to tell your provider that you have sleep apnea. You may need to bring your device with you. Keep all follow-up visits. Your provider will want to check on your condition. Where to find more information National Heart, Lung, and Blood Institute: buffalodrycleaner.gl This information is not intended to replace advice given to you by your health care provider. Make sure you discuss any questions you have with your health care provider. Document Revised: 09/13/2022 Document Reviewed: 09/13/2022 Elsevier Patient Education  2024 [employer].
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
## 2024-04-14 ENCOUNTER — Ambulatory Visit: Admitting: Licensed Clinical Social Worker

## 2024-04-15 DIAGNOSIS — I10 Essential (primary) hypertension: Secondary | ICD-10-CM | POA: Diagnosis not present

## 2024-04-16 DIAGNOSIS — I10 Essential (primary) hypertension: Secondary | ICD-10-CM | POA: Diagnosis not present

## 2024-04-17 ENCOUNTER — Ambulatory Visit (INDEPENDENT_AMBULATORY_CARE_PROVIDER_SITE_OTHER): Admitting: Licensed Clinical Social Worker

## 2024-04-17 DIAGNOSIS — F331 Major depressive disorder, recurrent, moderate: Secondary | ICD-10-CM
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
## 2024-04-23 ENCOUNTER — Other Ambulatory Visit: Payer: Self-pay

## 2024-04-23 ENCOUNTER — Ambulatory Visit: Admitting: Family Medicine

## 2024-04-23 ENCOUNTER — Other Ambulatory Visit (HOSPITAL_BASED_OUTPATIENT_CLINIC_OR_DEPARTMENT_OTHER): Payer: Self-pay

## 2024-04-23 ENCOUNTER — Encounter: Payer: Self-pay | Admitting: Family Medicine

## 2024-04-23 VITALS — BP 115/80 | HR 76 | Temp 97.7°F | Ht 64.0 in | Wt 355.0 lb

## 2024-04-23 DIAGNOSIS — R632 Polyphagia: Secondary | ICD-10-CM

## 2024-04-23 DIAGNOSIS — G4733 Obstructive sleep apnea (adult) (pediatric): Secondary | ICD-10-CM | POA: Diagnosis not present

## 2024-04-23 DIAGNOSIS — N926 Irregular menstruation, unspecified: Secondary | ICD-10-CM

## 2024-04-23 DIAGNOSIS — Z6841 Body Mass Index (BMI) 40.0 and over, adult: Secondary | ICD-10-CM | POA: Diagnosis not present

## 2024-04-23 MED ORDER — NALTREXONE HCL 50 MG PO TABS
25.0000 mg | ORAL_TABLET | Freq: Every day | ORAL | 0 refills | Status: DC
  Filled 2024-04-23: qty 15, 30d supply, fill #0

## 2024-04-23 MED ORDER — TOPIRAMATE 50 MG PO TABS
50.0000 mg | ORAL_TABLET | Freq: Every day | ORAL | 0 refills | Status: DC
  Filled 2024-04-23: qty 30, 30d supply, fill #0

## 2024-04-23 MED ORDER — ZEPBOUND 2.5 MG/0.5ML ~~LOC~~ SOAJ
2.5000 mg | SUBCUTANEOUS | 0 refills | Status: DC
  Filled 2024-04-23: qty 2, 28d supply, fill #0

## 2024-04-23 NOTE — Progress Notes (Signed)
 Office: [phone]  /  Fax: [phone]  WEIGHT SUMMARY AND BIOMETRICS  Starting Date: 03/08/23  Starting Weight: 369lb   Weight Lost Since Last Visit: 5lb   Vitals Temp: 97.7 F (36.5 C) BP: 115/80 Pulse Rate: 76 SpO2: 95 %   Body Composition  Body Fat %: 61.1 % Fat Mass (lbs): 217 lbs Muscle Mass (lbs): 131.2 lbs Visceral Fat Rating : 26     HPI  Chief Complaint: OBESITY  Marissa Joseph is here to discuss her progress with her obesity treatment plan. She is on the the Category 3 Plan and states she is following her eating plan approximately 75 % of the time. She states she is doing physical therapy.   Interval History:  Since last office visit she is down 5 lb This gives her net weight loss of 14 pounds in 13 months of medically supervised weight management This is a 3.7% total body weight loss She is trying to track calories most days with a goal of 1600 cal/ day but many days she is getting ~1300 cal/ day She has a good support system at home She has previous declined antiobesity medication and  weight loss surgery  24 hr food recall includes: 2 scrambled eggs + grits Tuna salad or a grilled chicken sandwich +/- some fries Chips at work emergency planning/management officer brings) Chili (cooked at home) She has some sugar cravings (keeping them out of the house)  Pharmacotherapy: Topirmate 50 mg daily and Naltrexone  25 mg daily  PHYSICAL EXAM:  Blood pressure 115/80, pulse 76, temperature 97.7 F (36.5 C), height 5' 4 (1.626 m), weight (!) 355 lb (161 kg), SpO2 95%. Body mass index is 60.94 kg/m.  General: She is overweight, cooperative, alert, well developed, and in no acute distress. PSYCH: Has normal mood, affect and thought process.   Lungs: Normal breathing effort, no conversational dyspnea.   ASSESSMENT AND PLAN  TREATMENT PLAN FOR OBESITY:  Recommended Dietary Goals  Marissa Joseph is currently in the action stage of change. As such, her goal is to continue weight  management plan. She has agreed to keeping a food journal and adhering to recommended goals of 1600 calories and 90+ grams of protein.  Behavioral Intervention  We discussed the following Behavioral Modification Strategies today: increasing lean protein intake to established goals, increasing fiber rich foods, increasing water intake , work on tracking and journaling calories using tracking application, keeping healthy foods at home, identifying sources and decreasing liquid calories, work on managing stress, creating time for self-care and relaxation, avoiding temptations and identifying enticing environmental cues, continue to practice mindfulness when eating, and planning for success.  Additional resources provided today: NA  Recommended Physical Activity Goals  Marissa Joseph has been advised to work up to 150 minutes of moderate intensity aerobic activity a week and strengthening exercises 2-3 times per week for cardiovascular health, weight loss maintenance and preservation of muscle mass.   She has agreed to Think about enjoyable ways to increase daily physical activity and overcoming barriers to exercise and Increase physical activity in their day and reduce sedentary time (increase NEAT).  Pharmacotherapy changes for the treatment of obesity: Begin Zepbound  2.5 mg once weekly injection for OSA indication Patient denies a personal or family history of pancreatitis, medullary thyroid  carcinoma or multiple endocrine neoplasia type II. Recommend reviewing pen training video online.  ASSOCIATED CONDITIONS ADDRESSED TODAY  OSA (obstructive sleep apnea) Reviewed notes from Dr. Chalice dated 04/11/2024.  Her most up-to-date polysomnogram shows moderate OSA.  She is encouraged  to proceed with Zepbound  for the indication of moderate OSA.  We have discussed mechanism of action and potential adverse side effects of Zepbound  including review of the zepbound .com website.  Avoid pregnancy while on Zepbound .   Continue use of CPAP nightly along with supplemental oxygen.  Look for improvements in energy levels and sleep quality.  -     Zepbound ; Inject 2.5 mg into the skin once a week.  Dispense: 2 mL; Refill: 0  Polyphagia Food cravings and food impulse control have improved on topiramate  50 mg in the morning and naltrexone  25 mg in the evening.  She is often forgetting to take her naltrexone  and plans to use a reminder on phone.  Avoid high sugar foods and drinks and encourage lean protein and fiber intake with meals with plenty of water intake.  -     Topiramate ; Take 1 tablet (50 mg total) by mouth daily.  Dispense: 30 tablet; Refill: 0 -     Naltrexone  HCl; Take 0.5 tablets (25 mg total) by mouth daily.  Dispense: 15 tablet; Refill: 0  Morbid obesity (HCC) Improving  BMI 60.0-69.9, adult (HCC)  Irregular menses She notes irregular menses this year.  She is not on birth control.  She has not been sexually active.  She also notes weight gain, hirsutism and a history of fibroids.  She has never had a formal diagnosis of PCOS but wonders if she has this.  Will plan to obtain a fasting insulin , testosterone, LH and FSH, cortisol and DHEA level next visit.    She was informed of the importance of frequent follow up visits to maximize her success with intensive lifestyle modifications for her multiple health conditions.   ATTESTASTION STATEMENTS:  Reviewed by clinician on day of visit: allergies, medications, problem list, medical history, surgical history, family history, social history, and previous encounter notes pertinent to obesity diagnosis.   I have personally spent 30 minutes total time today in preparation, patient care, nutritional counseling and education,  and documentation for this visit, including the following: review of most recent clinical lab tests, prescribing medications/ refilling medications, reviewing medical assistant documentation, review and interpretation of bioimpedence  results.     Darice Haddock, D.O. DABFM, DABOM [facility] [address] [place], [place] [address] [phone]

## 2024-04-24 ENCOUNTER — Encounter (HOSPITAL_BASED_OUTPATIENT_CLINIC_OR_DEPARTMENT_OTHER): Payer: Self-pay | Admitting: Physical Therapy

## 2024-04-24 ENCOUNTER — Ambulatory Visit (HOSPITAL_BASED_OUTPATIENT_CLINIC_OR_DEPARTMENT_OTHER): Payer: Self-pay | Admitting: Physical Therapy

## 2024-04-24 DIAGNOSIS — M25561 Pain in right knee: Secondary | ICD-10-CM

## 2024-04-24 DIAGNOSIS — M6281 Muscle weakness (generalized): Secondary | ICD-10-CM

## 2024-04-24 DIAGNOSIS — M25562 Pain in left knee: Secondary | ICD-10-CM | POA: Diagnosis not present

## 2024-04-24 DIAGNOSIS — R262 Difficulty in walking, not elsewhere classified: Secondary | ICD-10-CM | POA: Diagnosis not present

## 2024-04-24 DIAGNOSIS — M5459 Other low back pain: Secondary | ICD-10-CM | POA: Diagnosis not present

## 2024-04-24 DIAGNOSIS — M25571 Pain in right ankle and joints of right foot: Secondary | ICD-10-CM | POA: Diagnosis not present
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
## 2024-04-28 ENCOUNTER — Ambulatory Visit: Admitting: Licensed Clinical Social Worker

## 2024-04-30 ENCOUNTER — Telehealth: Payer: Self-pay | Admitting: *Deleted

## 2024-04-30 NOTE — Telephone Encounter (Signed)
 Prior authorization done via cover my meds for patients Zepbound. Waiting on determination.

## 2024-05-05 ENCOUNTER — Encounter: Payer: Self-pay | Admitting: Internal Medicine

## 2024-05-06 ENCOUNTER — Other Ambulatory Visit (HOSPITAL_BASED_OUTPATIENT_CLINIC_OR_DEPARTMENT_OTHER): Payer: Self-pay

## 2024-05-06 ENCOUNTER — Ambulatory Visit: Payer: Self-pay | Admitting: *Deleted

## 2024-05-06 ENCOUNTER — Encounter: Payer: Self-pay | Admitting: Internal Medicine

## 2024-05-06 ENCOUNTER — Ambulatory Visit: Admitting: Internal Medicine

## 2024-05-06 VITALS — BP 120/82 | HR 81 | Temp 98.5°F | Ht 64.0 in | Wt 362.0 lb

## 2024-05-06 DIAGNOSIS — R21 Rash and other nonspecific skin eruption: Secondary | ICD-10-CM | POA: Diagnosis not present

## 2024-05-06 DIAGNOSIS — I10 Essential (primary) hypertension: Secondary | ICD-10-CM | POA: Diagnosis not present

## 2024-05-06 DIAGNOSIS — L97921 Non-pressure chronic ulcer of unspecified part of left lower leg limited to breakdown of skin: Secondary | ICD-10-CM | POA: Diagnosis not present

## 2024-05-06 DIAGNOSIS — K1379 Other lesions of oral mucosa: Secondary | ICD-10-CM | POA: Insufficient documentation

## 2024-05-06 DIAGNOSIS — L97911 Non-pressure chronic ulcer of unspecified part of right lower leg limited to breakdown of skin: Secondary | ICD-10-CM | POA: Insufficient documentation

## 2024-05-06 MED ORDER — KETOCONAZOLE 2 % EX CREA
1.0000 | TOPICAL_CREAM | Freq: Every day | CUTANEOUS | 0 refills | Status: AC
  Filled 2024-05-06: qty 60, 30d supply, fill #0

## 2024-05-06 NOTE — Telephone Encounter (Signed)
    FYI Only or Action Required?: FYI only for provider: appointment scheduled on 05/06/24.  Patient was last seen in primary care on 04/23/2024 by Waylan Darice BRAVO, DO.  Called Nurse Triage reporting Rash.  Symptoms began several weeks ago.  Interventions attempted: OTC medications: antifungal cream.  Symptoms are: gradually worsening.  Triage Disposition: See Physician Within 24 Hours patient requesting as soon as possible   Patient/caregiver understands and will follow disposition?: Yes                Copied from CRM #[id number]. Topic: Clinical - Red Word Triage >> May 06, 2024  8:16 AM Mesmerise C wrote: [facility] that prompted transfer to Nurse Triage: Patient has a infection on her foot, finger, and legs states it's been getting worse and painful along with itchiness started 3 weeks ago first started on her foot and started spreading Reason for Disposition  [1] Looks infected (e.g., spreading redness, pus) AND [2] no fever  Answer Assessment - Initial Assessment Questions Appt today with PCP. Patient requesting to be seen as soon as possible. Hx cellulitis. Reports possible ringworm from dog. Has been applying antifungal cream with no relief.      1. APPEARANCE of RASH: What does the rash look like? (e.g., blisters, dry flaky skin, red spots, redness, sores)     Dry flaky skin, redness 2. LOCATION: Where is the rash located?      Bilateral legs, feet, toes, ankles, hands, roof in mouth  3. NUMBER: How many spots are there?      Multiple  4. SIZE: How big are the spots? (e.g., inches, cm; or compare to size of pinhead, tip of pen, eraser, pea)      Circular and oval areas 5. ONSET: When did the rash start?      3 weeks ago  6. ITCHING: Does the rash itch? If Yes, ask: How bad is the itch?  (Scale 0-10; or none, mild, moderate, severe)     Mild  7. PAIN: Does the rash hurt? If Yes, ask: How bad is the pain?  (Scale 0-10; or none, mild, moderate,  severe)     Yes moderate pain  8. OTHER SYMPTOMS: Do you have any other symptoms? (e.g., fever)     Rash  dry flaky skin to legs ,oozing clear fluid no fever 9. PREGNANCY: Is there any chance you are pregnant? When was your last menstrual period?     na  Protocols used: Rash or Redness - Localized-A-AH

## 2024-05-06 NOTE — Progress Notes (Signed)
 Subjective:    Patient ID: Marissa Joseph, female    DOB: December 28, 1980, 43 y.o.   MRN: [id number]      HPI Marissa Joseph is here for No chief complaint on file.   Discussed the use of AI scribe software for clinical note transcription with the patient, who gave verbal consent to proceed.  History of Present Illness Marissa Joseph is a 43 year old female with eczema who presents with worsening skin rashes and irritation.  She has a worsening rash on the side of her leg, and it has also appeared on her hands, toes, and both legs. Her cuticles have bulged, but unlike previous episodes, they did not throb or cause significant pain. The skin is very dry, leading to splitting and irritation, and she finds some relief by wearing gloves.  She describes a sore on the top of her mouth that initially appeared as a red dot with a bumpy ring around it. It has since started to clear up, and she does not recall any specific food that might have caused it.   She experiences significant itching, particularly at night around 9 PM, affecting the area between her toes and the front two toes on one foot. She has been using a triple action cream and an antibacterial cream, but these have not provided relief. Her skin absorbs moisture quickly, leaving it dry.  She has a history of eczema, which has not flared up this severely since childhood. The current flare-up is affecting her hands, causing pain when bending her fingers. She has tried triamcinolone  cream, which has not been effective in alleviating her symptoms.  She suspects a possible fungal infection, potentially contracted from her dog, who had fleas. She has been using various creams, including one starting with a 'B', possibly bacitracin , but has not found them effective.  No recent vomiting or unusual symptoms aside from the skin issues. The rash itches significantly, especially at night, and her skin is very dry.       Medications and  allergies reviewed with patient and updated if appropriate.  Current Outpatient Medications on File Prior to Visit  Medication Sig Dispense Refill   albuterol  (VENTOLIN  HFA) 108 (90 Base) MCG/ACT inhaler Inhale 1-2 puffs into the lungs every 6 (six) hours as needed for wheezing 6.7 g 5   budesonide -formoterol  (SYMBICORT ) 80-4.5 MCG/ACT inhaler Inhale 2 puffs into the lungs 2 (two) times daily. 10.2 g 3   buPROPion  (WELLBUTRIN  XL) 300 MG 24 hr tablet Take 1 tablet (300 mg total) by mouth daily. 90 tablet 1   diclofenac  Sodium (VOLTAREN ) 1 % GEL Apply 4 g topically 4 (four) times daily as needed. 150 g 0   docusate sodium  (COLACE) 100 MG capsule Take 1 capsule (100 mg total) by mouth 2 (two) times daily. 60 capsule 0   EPINEPHrine  0.3 mg/0.3 mL IJ SOAJ injection Inject 0.3 mg into the muscle as needed for anaphylaxis. 2 each 0   famotidine  (PEPCID ) 20 MG tablet Take 1 tablet (20 mg total) by mouth daily. 7 tablet 0   furosemide  (LASIX ) 20 MG tablet Take 1 tablet (20 mg total) by mouth daily. 90 tablet 1   gabapentin  (NEURONTIN ) 100 MG capsule Take 2 capsules (200 mg total) by mouth at bedtime. 60 capsule 5   medroxyPROGESTERone  (PROVERA ) 10 MG tablet Take 1 tablet (10 mg total) by mouth daily. Take for days to bring on menses 10 tablet 0   montelukast  (SINGULAIR ) 10 MG tablet Take 1  tablet (10 mg total) by mouth at bedtime. 90 tablet 1   naltrexone  (DEPADE) 50 MG tablet Take 0.5 tablets (25 mg total) by mouth daily. 15 tablet 0   nebivolol  (BYSTOLIC ) 5 MG tablet Take 1 tablet (5 mg total) by mouth daily. 90 tablet 3   olmesartan -hydrochlorothiazide  (BENICAR  HCT) 20-12.5 MG tablet Take 1 tablet by mouth daily. 90 tablet 1   polyethylene glycol powder (GLYCOLAX /MIRALAX ) 17 GM/SCOOP powder Take 17 g by mouth 2 (two) times daily as needed for severe constipation. 238 g 1   tirzepatide  (ZEPBOUND ) 2.5 MG/0.5ML Pen Inject 2.5 mg into the skin once a week. 2 mL 0   topiramate  (TOPAMAX ) 50 MG tablet Take 1  tablet (50 mg total) by mouth daily. 30 tablet 0   triamcinolone  cream (KENALOG ) 0.1 % Apply 1 Application topically 2 (two) times daily. 30 g 5   No current facility-administered medications on file prior to visit.    Review of Systems     Objective:   Vitals:   05/06/24 1108  BP: 120/82  Pulse: 81  Temp: 98.5 F (36.9 C)  SpO2: 97%   BP Readings from Last 3 Encounters:  05/06/24 120/82  04/23/24 115/80  04/11/24 112/68   Wt Readings from Last 3 Encounters:  05/06/24 (!) 362 lb (164.2 kg)  04/23/24 (!) 355 lb (161 kg)  04/11/24 (!) 366 lb (166 kg)   Body mass index is 62.14 kg/m.    Physical Exam Constitutional:      Appearance: Normal appearance.  HENT:     Head: Normocephalic and atraumatic.     Mouth/Throat:     Comments: Roof of mouth healing shallow ulcer-likely posttraumatic Skin:    General: Skin is warm and dry.     Comments: Bilateral first toe extending down into the foot and between 1st and 2nd toe there is thickened skin that is hyperpigmented, raised, dry.  No open wounds or ulcers.  Toenails dark.  Bilateral lower legs just above ankles areas of healed ulcers with surrounding thickened skin, hyperpigmented, a couple of areas with shallow ulcers with granulation tissue.  No discharge, bleeding.  No areas of swelling or erythema around.    Neurological:     Mental Status: She is alert.            Assessment & Plan:    See Problem List for Assessment and Plan of chronic medical problems.

## 2024-05-06 NOTE — Assessment & Plan Note (Signed)
 Chronic Blood pressure controlled Working on weight loss Continue Benicar  HCT 20-12.5 mg daily, nebivolol   to 5 mg daily

## 2024-05-06 NOTE — Telephone Encounter (Signed)
 Zepbound  was denied. Per insurance company it is not covered due to plan exclusion.

## 2024-05-06 NOTE — Assessment & Plan Note (Signed)
 Chronic Has had ulcers bilateral lower extremities for a while Areas with thickened skin and hyperpigmentation Couple of shallow ulcers bilaterally lower ankles without active bleeding or discharge There does not appear to be any active infection so no antibiotic needed I think this area is not healing because of her leg edema and chronicity of the ulcers Has tried antibacterial ointments and steroids without improvement Advised to try Medihoney to see if that would moisturize the areas and also protect against infection in addition to help healing

## 2024-05-06 NOTE — Patient Instructions (Addendum)
    Dr Norleen Hurst Dermatology   Medications changes include :   Medihoney for your ankle wounds.   Ketoconazole  cream once a day for feet       Return if symptoms worsen or fail to improve.

## 2024-05-06 NOTE — Assessment & Plan Note (Signed)
 Acute Bilateral first toes down into the foot Skin is hyperpigmented, thickened and very itchy Has tried steroid creams without improvement so seems less likely to be her eczema ?  Fungal infection-seems less likely, but will try ketoconazole  cream daily If that is not effective can consider stronger steroid cream such as clobetasol Has appointment with dermatology now for a few months-May need to try to get in to a different practice a little bit earlier

## 2024-05-06 NOTE — Assessment & Plan Note (Signed)
 Acute healing ulcer and roof of the mouth Likely traumatic in nature although she does not remember hurting it Does look like it is healing Monitor only

## 2024-05-07 ENCOUNTER — Ambulatory Visit (HOSPITAL_BASED_OUTPATIENT_CLINIC_OR_DEPARTMENT_OTHER): Attending: Internal Medicine | Admitting: Physical Therapy

## 2024-05-07 ENCOUNTER — Encounter (HOSPITAL_BASED_OUTPATIENT_CLINIC_OR_DEPARTMENT_OTHER): Payer: Self-pay | Admitting: Physical Therapy

## 2024-05-07 ENCOUNTER — Other Ambulatory Visit: Payer: Self-pay

## 2024-05-07 DIAGNOSIS — M25561 Pain in right knee: Secondary | ICD-10-CM | POA: Insufficient documentation

## 2024-05-07 DIAGNOSIS — R279 Unspecified lack of coordination: Secondary | ICD-10-CM | POA: Insufficient documentation

## 2024-05-07 DIAGNOSIS — M25562 Pain in left knee: Secondary | ICD-10-CM | POA: Diagnosis present

## 2024-05-07 DIAGNOSIS — M25571 Pain in right ankle and joints of right foot: Secondary | ICD-10-CM | POA: Diagnosis present

## 2024-05-07 DIAGNOSIS — R293 Abnormal posture: Secondary | ICD-10-CM | POA: Diagnosis present

## 2024-05-07 DIAGNOSIS — R262 Difficulty in walking, not elsewhere classified: Secondary | ICD-10-CM | POA: Insufficient documentation

## 2024-05-07 DIAGNOSIS — M6281 Muscle weakness (generalized): Secondary | ICD-10-CM | POA: Insufficient documentation

## 2024-05-07 DIAGNOSIS — M5459 Other low back pain: Secondary | ICD-10-CM | POA: Insufficient documentation
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
## 2024-05-08 ENCOUNTER — Other Ambulatory Visit (HOSPITAL_BASED_OUTPATIENT_CLINIC_OR_DEPARTMENT_OTHER): Payer: Self-pay

## 2024-05-10 ENCOUNTER — Ambulatory Visit (HOSPITAL_BASED_OUTPATIENT_CLINIC_OR_DEPARTMENT_OTHER): Admitting: Physical Therapy
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
## 2024-05-14 ENCOUNTER — Ambulatory Visit (HOSPITAL_BASED_OUTPATIENT_CLINIC_OR_DEPARTMENT_OTHER): Admitting: Physical Therapy

## 2024-05-14 DIAGNOSIS — M6281 Muscle weakness (generalized): Secondary | ICD-10-CM

## 2024-05-14 DIAGNOSIS — M25571 Pain in right ankle and joints of right foot: Secondary | ICD-10-CM

## 2024-05-14 DIAGNOSIS — M25561 Pain in right knee: Secondary | ICD-10-CM

## 2024-05-14 DIAGNOSIS — M5459 Other low back pain: Secondary | ICD-10-CM

## 2024-05-14 DIAGNOSIS — R262 Difficulty in walking, not elsewhere classified: Secondary | ICD-10-CM
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
## 2024-05-15 ENCOUNTER — Encounter (HOSPITAL_BASED_OUTPATIENT_CLINIC_OR_DEPARTMENT_OTHER): Payer: Self-pay | Admitting: Physical Therapy

## 2024-05-15 DIAGNOSIS — G4733 Obstructive sleep apnea (adult) (pediatric): Secondary | ICD-10-CM | POA: Diagnosis not present

## 2024-05-15 DIAGNOSIS — I10 Essential (primary) hypertension: Secondary | ICD-10-CM | POA: Diagnosis not present

## 2024-05-16 DIAGNOSIS — G4733 Obstructive sleep apnea (adult) (pediatric): Secondary | ICD-10-CM | POA: Diagnosis not present

## 2024-05-16 DIAGNOSIS — I10 Essential (primary) hypertension: Secondary | ICD-10-CM | POA: Diagnosis not present

## 2024-05-17 ENCOUNTER — Ambulatory Visit (HOSPITAL_BASED_OUTPATIENT_CLINIC_OR_DEPARTMENT_OTHER): Admitting: Physical Therapy

## 2024-05-17 DIAGNOSIS — M25571 Pain in right ankle and joints of right foot: Secondary | ICD-10-CM | POA: Diagnosis not present

## 2024-05-17 DIAGNOSIS — M5459 Other low back pain: Secondary | ICD-10-CM

## 2024-05-17 DIAGNOSIS — R262 Difficulty in walking, not elsewhere classified: Secondary | ICD-10-CM

## 2024-05-17 DIAGNOSIS — M6281 Muscle weakness (generalized): Secondary | ICD-10-CM

## 2024-05-17 DIAGNOSIS — R293 Abnormal posture: Secondary | ICD-10-CM

## 2024-05-17 DIAGNOSIS — R279 Unspecified lack of coordination: Secondary | ICD-10-CM

## 2024-05-17 DIAGNOSIS — M25561 Pain in right knee: Secondary | ICD-10-CM

## 2024-05-21 ENCOUNTER — Ambulatory Visit: Admitting: Licensed Clinical Social Worker

## 2024-05-21 ENCOUNTER — Encounter (HOSPITAL_BASED_OUTPATIENT_CLINIC_OR_DEPARTMENT_OTHER): Payer: Self-pay | Admitting: Physical Therapy

## 2024-05-21 ENCOUNTER — Ambulatory Visit (HOSPITAL_BASED_OUTPATIENT_CLINIC_OR_DEPARTMENT_OTHER): Admitting: Physical Therapy

## 2024-05-21 DIAGNOSIS — M25571 Pain in right ankle and joints of right foot: Secondary | ICD-10-CM

## 2024-05-21 DIAGNOSIS — F331 Major depressive disorder, recurrent, moderate: Secondary | ICD-10-CM

## 2024-05-21 DIAGNOSIS — M25561 Pain in right knee: Secondary | ICD-10-CM

## 2024-05-21 DIAGNOSIS — M5459 Other low back pain: Secondary | ICD-10-CM

## 2024-05-21 DIAGNOSIS — M6281 Muscle weakness (generalized): Secondary | ICD-10-CM

## 2024-05-21 DIAGNOSIS — R262 Difficulty in walking, not elsewhere classified: Secondary | ICD-10-CM
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
## 2024-05-22 ENCOUNTER — Encounter: Payer: Self-pay | Admitting: Family Medicine

## 2024-05-22 ENCOUNTER — Other Ambulatory Visit (HOSPITAL_BASED_OUTPATIENT_CLINIC_OR_DEPARTMENT_OTHER): Payer: Self-pay

## 2024-05-22 ENCOUNTER — Ambulatory Visit: Admitting: Family Medicine

## 2024-05-22 VITALS — BP 121/87 | HR 69 | Temp 98.0°F | Ht 64.0 in | Wt 351.0 lb

## 2024-05-22 DIAGNOSIS — N926 Irregular menstruation, unspecified: Secondary | ICD-10-CM

## 2024-05-22 DIAGNOSIS — G4733 Obstructive sleep apnea (adult) (pediatric): Secondary | ICD-10-CM

## 2024-05-22 DIAGNOSIS — Z7289 Other problems related to lifestyle: Secondary | ICD-10-CM | POA: Diagnosis not present

## 2024-05-22 DIAGNOSIS — R632 Polyphagia: Secondary | ICD-10-CM | POA: Diagnosis not present

## 2024-05-22 DIAGNOSIS — Z6841 Body Mass Index (BMI) 40.0 and over, adult: Secondary | ICD-10-CM

## 2024-05-22 DIAGNOSIS — Z9189 Other specified personal risk factors, not elsewhere classified: Secondary | ICD-10-CM

## 2024-05-22 DIAGNOSIS — E559 Vitamin D deficiency, unspecified: Secondary | ICD-10-CM

## 2024-05-22 DIAGNOSIS — R7303 Prediabetes: Secondary | ICD-10-CM | POA: Diagnosis not present

## 2024-05-22 DIAGNOSIS — I10 Essential (primary) hypertension: Secondary | ICD-10-CM | POA: Diagnosis not present

## 2024-05-22 MED ORDER — TOPIRAMATE 50 MG PO TABS
50.0000 mg | ORAL_TABLET | Freq: Every day | ORAL | 1 refills | Status: AC
  Filled 2024-05-22: qty 30, 30d supply, fill #0
  Filled 2024-06-28: qty 30, 30d supply, fill #1

## 2024-05-22 MED ORDER — NALTREXONE HCL 50 MG PO TABS
25.0000 mg | ORAL_TABLET | Freq: Every day | ORAL | 1 refills | Status: AC
  Filled 2024-05-22: qty 15, 30d supply, fill #0
  Filled 2024-06-28: qty 15, 30d supply, fill #1

## 2024-05-22 NOTE — Progress Notes (Signed)
 Office: [phone]  /  Fax: [phone]  WEIGHT SUMMARY AND BIOMETRICS  Starting Date: 03/08/23  Starting Weight: 369lb   Weight Lost Since Last Visit: 4lb   Vitals Temp: 98 F (36.7 C) BP: 121/87 Pulse Rate: 69 SpO2: 97 %   Body Composition  Body Fat %: 59.9 % Fat Mass (lbs): 210.4 lbs Muscle Mass (lbs): 134 lbs Visceral Fat Rating : 25     HPI  Chief Complaint: OBESITY  Marissa Joseph is here to discuss her progress with her obesity treatment plan. She is on the the Category 3 Plan and states she is following her eating plan approximately 50 % of the time. She states she is exercising 30 minutes 2 times per week.  Interval History:  Since last office visit she is down 4 lb She has a net weight loss of 18 lb in 14 mos of medically supervised weight management This is a 4.8% TBW loss She has been stressed since the lost of her grandfather 2 weeks She has been eating breakfast and lunch She is still struggling with poor food choices She has been skipping dinners some days of the week She denies nighttime snacks She cut back on daytime snacks She has coffee in the morning and water, lemonade when eating out or Sprite She has been more active doing day to day activities She is seeing a counselor and is in PT for bilateral knee pain  Pharmacotherapy: Naltrexone  25 mg at night and Topiramate  50 mg in the morning  PHYSICAL EXAM:  Blood pressure 121/87, pulse 69, temperature 98 F (36.7 C), height 5' 4 (1.626 m), weight (!) 351 lb (159.2 kg), SpO2 97%. Body mass index is 60.25 kg/m.  General: She is overweight, cooperative, alert, well developed, and in no acute distress. PSYCH: Has normal mood, affect and thought process.   Lungs: Normal breathing effort, no conversational dyspnea.   ASSESSMENT AND PLAN  TREATMENT PLAN FOR OBESITY:  Recommended Dietary Goals  Yuka is currently in the action stage of change. As such, her goal is to continue weight  management plan. She has agreed to the Category 3 Plan.  Behavioral Intervention  We discussed the following Behavioral Modification Strategies today: increasing lean protein intake to established goals, increasing fiber rich foods, increasing water intake , work on meal planning and preparation, keeping healthy foods at home, decreasing eating out or consumption of processed foods, and making healthy choices when eating convenient foods, practice mindfulness eating and understand the difference between hunger signals and cravings, work on managing stress, creating time for self-care and relaxation, avoiding temptations and identifying enticing environmental cues, continue to work on implementation of reduced calorie nutritional plan, and celebration eating strategies.  Additional resources provided today: NA  Recommended Physical Activity Goals  [name] has been advised to work up to 150 minutes of moderate intensity aerobic activity a week and strengthening exercises 2-3 times per week for cardiovascular health, weight loss maintenance and preservation of muscle mass.   She has agreed to Think about enjoyable ways to increase daily physical activity and overcoming barriers to exercise and Increase physical activity in their day and reduce sedentary time (increase NEAT). Start with PT - scheduled  Pharmacotherapy changes for the treatment of obesity: none She lacks coverage for GLP-1 RAs  ASSOCIATED CONDITIONS ADDRESSED TODAY  Polyphagia Improving on current meds Has some paresthesias from topiramate  Add OTC vitamin C 75 mg daily  -     Naltrexone  HCl; Take 0.5 tablets (25 mg total)  by mouth daily.  Dispense: 15 tablet; Refill: 1 -     Topiramate ; Take 1 tablet (50 mg total) by mouth daily.  Dispense: 30 tablet; Refill: 1  Vitamin D  deficiency Last vitamin D  Lab Results  Component Value Date   VD25OH 35.86 01/14/2024  Taking OTC vitamin D  2,000 international units  daily Repeat lab  today with a goal > 50  -     VITAMIN D  25 Hydroxy (Vit-D Deficiency, Fractures)  Irregular menses Long hx of irregular menses, last menstrual cycle was 3 weeks ago Check labs today, suspicious for PCOS diagnosis -     Testosterone  -     DHEA-sulfate -     FSH/LH  Prediabetes Lab Results  Component Value Date   HGBA1C 5.7 01/14/2024  She is not on metformin and lacks coverage for GLP-1 RA Update labs today Continue prescribed diet with active weight loss plan -     Insulin , random -     Hemoglobin A1c  Morbid obesity (HCC) Slowly improving Barriers to progress include lack of support, depressed mood, chronic pain with no regular exercise, sedentary job and poor eating habits.  She is making strides in the right direction and is motivated to continue Bariatric surgery would be a better choice given her BMI of 60 and lack of GLP-1 RA coverage but she declined.  BMI 60.0-69.9, adult (HCC)  OSA (obstructive sleep apnea) Continue CPAP nightly and f/u with sleep medicine Improve sleep time to 8 hrs/ night  Sedentary lifestyle Unchanged We discussed moving from PT at [facility] to trainer/ water exercise in the next month She is open minded     She was informed of the importance of frequent follow up visits to maximize her success with intensive lifestyle modifications for her multiple health conditions.   ATTESTASTION STATEMENTS:  Reviewed by clinician on day of visit: allergies, medications, problem list, medical history, surgical history, family history, social history, and previous encounter notes pertinent to obesity diagnosis.   I have personally spent 33 minutes total time today in preparation, patient care, nutritional counseling and education,  and documentation for this visit, including the following: review of most recent clinical lab tests, prescribing medications/ refilling medications, reviewing medical assistant documentation, review and interpretation of  bioimpedence results.     Darice Haddock, D.O. DABFM, DABOM [facility] [address] [place], [place] [address] [phone]

## 2024-05-22 NOTE — Patient Instructions (Signed)
 Add a Women's Multivitamin daily - gummies are fine Add a Vitamin C chewable daily (this helps the tingles)

## 2024-05-23 LAB — HEMOGLOBIN A1C
Est. average glucose Bld gHb Est-mCnc: 108 mg/dL
Hgb A1c MFr Bld: 5.4 % (ref 4.8–5.6)

## 2024-05-23 LAB — DHEA-SULFATE: DHEA-SO4: 205 ug/dL (ref 57.3–279.2)

## 2024-05-23 LAB — FSH/LH
FSH: 37.1 m[IU]/mL
LH: 19.3 m[IU]/mL

## 2024-05-23 LAB — VITAMIN D 25 HYDROXY (VIT D DEFICIENCY, FRACTURES): Vit D, 25-Hydroxy: 31.6 ng/mL (ref 30.0–100.0)

## 2024-05-23 LAB — INSULIN, RANDOM: INSULIN: 14.5 u[IU]/mL (ref 2.6–24.9)

## 2024-05-23 LAB — TESTOSTERONE: Testosterone: 69 ng/dL — ABNORMAL HIGH (ref 4–50)

## 2024-05-24 ENCOUNTER — Ambulatory Visit (HOSPITAL_BASED_OUTPATIENT_CLINIC_OR_DEPARTMENT_OTHER): Admitting: Physical Therapy

## 2024-05-27 ENCOUNTER — Encounter (HOSPITAL_BASED_OUTPATIENT_CLINIC_OR_DEPARTMENT_OTHER): Payer: Self-pay | Admitting: Physical Therapy

## 2024-05-27 ENCOUNTER — Encounter (HOSPITAL_BASED_OUTPATIENT_CLINIC_OR_DEPARTMENT_OTHER): Admitting: Physical Therapy

## 2024-05-27 DIAGNOSIS — M25571 Pain in right ankle and joints of right foot: Secondary | ICD-10-CM

## 2024-05-27 DIAGNOSIS — R262 Difficulty in walking, not elsewhere classified: Secondary | ICD-10-CM

## 2024-05-27 DIAGNOSIS — M6281 Muscle weakness (generalized): Secondary | ICD-10-CM

## 2024-05-27 DIAGNOSIS — M25562 Pain in left knee: Secondary | ICD-10-CM

## 2024-05-27 DIAGNOSIS — M5459 Other low back pain: Secondary | ICD-10-CM
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
## 2024-05-30 ENCOUNTER — Ambulatory Visit (HOSPITAL_BASED_OUTPATIENT_CLINIC_OR_DEPARTMENT_OTHER)

## 2024-05-30 ENCOUNTER — Encounter (HOSPITAL_BASED_OUTPATIENT_CLINIC_OR_DEPARTMENT_OTHER): Payer: Self-pay

## 2024-05-30 DIAGNOSIS — M5459 Other low back pain: Secondary | ICD-10-CM

## 2024-05-30 DIAGNOSIS — M25571 Pain in right ankle and joints of right foot: Secondary | ICD-10-CM

## 2024-05-30 DIAGNOSIS — M25561 Pain in right knee: Secondary | ICD-10-CM

## 2024-05-30 DIAGNOSIS — R262 Difficulty in walking, not elsewhere classified: Secondary | ICD-10-CM

## 2024-05-30 DIAGNOSIS — M6281 Muscle weakness (generalized): Secondary | ICD-10-CM
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
## 2024-06-02 ENCOUNTER — Ambulatory Visit: Payer: Self-pay | Admitting: Family Medicine

## 2024-06-06 ENCOUNTER — Ambulatory Visit: Admitting: Licensed Clinical Social Worker

## 2024-06-18 ENCOUNTER — Other Ambulatory Visit (HOSPITAL_BASED_OUTPATIENT_CLINIC_OR_DEPARTMENT_OTHER): Payer: Self-pay

## 2024-06-26 ENCOUNTER — Ambulatory Visit: Admitting: Family Medicine

## 2024-07-02 ENCOUNTER — Other Ambulatory Visit (HOSPITAL_BASED_OUTPATIENT_CLINIC_OR_DEPARTMENT_OTHER): Payer: Self-pay

## 2024-07-03 ENCOUNTER — Other Ambulatory Visit (HOSPITAL_BASED_OUTPATIENT_CLINIC_OR_DEPARTMENT_OTHER): Payer: Self-pay

## 2024-07-18 ENCOUNTER — Ambulatory Visit: Admitting: Internal Medicine

## 2024-08-21 ENCOUNTER — Ambulatory Visit: Admitting: Dermatology
